# Patient Record
Sex: Male | Born: 1945 | Hispanic: Yes | Marital: Married | Smoking: Former smoker
Health system: Southern US, Community
[De-identification: ages and names within clinical notes are randomized; demographics above are authoritative.]

## PROBLEM LIST (undated history)

## (undated) DIAGNOSIS — N400 Enlarged prostate without lower urinary tract symptoms: Secondary | ICD-10-CM

## (undated) DIAGNOSIS — R0600 Dyspnea, unspecified: Secondary | ICD-10-CM

## (undated) DIAGNOSIS — C801 Malignant (primary) neoplasm, unspecified: Secondary | ICD-10-CM

## (undated) DIAGNOSIS — J431 Panlobular emphysema: Secondary | ICD-10-CM

## (undated) DIAGNOSIS — K219 Gastro-esophageal reflux disease without esophagitis: Secondary | ICD-10-CM

## (undated) DIAGNOSIS — J45909 Unspecified asthma, uncomplicated: Secondary | ICD-10-CM

## (undated) DIAGNOSIS — J449 Chronic obstructive pulmonary disease, unspecified: Secondary | ICD-10-CM

## (undated) DIAGNOSIS — H269 Unspecified cataract: Secondary | ICD-10-CM

## (undated) DIAGNOSIS — C679 Malignant neoplasm of bladder, unspecified: Secondary | ICD-10-CM

## (undated) DIAGNOSIS — K4090 Unilateral inguinal hernia, without obstruction or gangrene, not specified as recurrent: Secondary | ICD-10-CM

## (undated) DIAGNOSIS — E785 Hyperlipidemia, unspecified: Secondary | ICD-10-CM

## (undated) DIAGNOSIS — I10 Essential (primary) hypertension: Secondary | ICD-10-CM

## (undated) DIAGNOSIS — G4733 Obstructive sleep apnea (adult) (pediatric): Secondary | ICD-10-CM

## (undated) DIAGNOSIS — K567 Ileus, unspecified: Secondary | ICD-10-CM

## (undated) DIAGNOSIS — M1990 Unspecified osteoarthritis, unspecified site: Secondary | ICD-10-CM

## (undated) DIAGNOSIS — K9189 Other postprocedural complications and disorders of digestive system: Secondary | ICD-10-CM

## (undated) DIAGNOSIS — K5720 Diverticulitis of large intestine with perforation and abscess without bleeding: Secondary | ICD-10-CM

## (undated) HISTORY — DX: Essential (primary) hypertension: I10

## (undated) HISTORY — DX: Malignant neoplasm of bladder, unspecified: C67.9

## (undated) HISTORY — PX: COLONOSCOPY: SHX174

---

## 2007-12-16 DIAGNOSIS — M549 Dorsalgia, unspecified: Secondary | ICD-10-CM | POA: Insufficient documentation

## 2007-12-30 DIAGNOSIS — M5430 Sciatica, unspecified side: Secondary | ICD-10-CM | POA: Insufficient documentation

## 2008-02-08 ENCOUNTER — Ambulatory Visit: Payer: Self-pay

## 2008-04-27 DIAGNOSIS — Z0000 Encounter for general adult medical examination without abnormal findings: Secondary | ICD-10-CM | POA: Insufficient documentation

## 2009-03-28 DIAGNOSIS — B351 Tinea unguium: Secondary | ICD-10-CM | POA: Insufficient documentation

## 2010-07-31 DIAGNOSIS — J4489 Other specified chronic obstructive pulmonary disease: Secondary | ICD-10-CM | POA: Insufficient documentation

## 2010-07-31 DIAGNOSIS — J449 Chronic obstructive pulmonary disease, unspecified: Secondary | ICD-10-CM | POA: Insufficient documentation

## 2011-12-04 DIAGNOSIS — H25019 Cortical age-related cataract, unspecified eye: Secondary | ICD-10-CM | POA: Insufficient documentation

## 2011-12-04 DIAGNOSIS — H04129 Dry eye syndrome of unspecified lacrimal gland: Secondary | ICD-10-CM | POA: Insufficient documentation

## 2013-01-29 DIAGNOSIS — J4489 Other specified chronic obstructive pulmonary disease: Secondary | ICD-10-CM | POA: Insufficient documentation

## 2013-01-29 DIAGNOSIS — R911 Solitary pulmonary nodule: Secondary | ICD-10-CM | POA: Insufficient documentation

## 2013-01-29 DIAGNOSIS — J449 Chronic obstructive pulmonary disease, unspecified: Secondary | ICD-10-CM | POA: Insufficient documentation

## 2013-03-10 DIAGNOSIS — G894 Chronic pain syndrome: Secondary | ICD-10-CM | POA: Insufficient documentation

## 2013-03-12 ENCOUNTER — Ambulatory Visit: Payer: Self-pay | Admitting: Internal Medicine

## 2013-05-10 DIAGNOSIS — N400 Enlarged prostate without lower urinary tract symptoms: Secondary | ICD-10-CM | POA: Insufficient documentation

## 2013-05-10 DIAGNOSIS — K219 Gastro-esophageal reflux disease without esophagitis: Secondary | ICD-10-CM | POA: Insufficient documentation

## 2013-05-18 HISTORY — PX: CATARACT EXTRACTION W/ INTRAOCULAR LENS IMPLANT: SHX1309

## 2013-07-06 HISTORY — PX: CATARACT EXTRACTION W/ INTRAOCULAR LENS IMPLANT: SHX1309

## 2014-03-28 DIAGNOSIS — G5600 Carpal tunnel syndrome, unspecified upper limb: Secondary | ICD-10-CM | POA: Insufficient documentation

## 2014-04-15 ENCOUNTER — Ambulatory Visit: Payer: Self-pay | Admitting: Internal Medicine

## 2014-05-09 DIAGNOSIS — M25569 Pain in unspecified knee: Secondary | ICD-10-CM | POA: Insufficient documentation

## 2014-05-09 DIAGNOSIS — R7302 Impaired glucose tolerance (oral): Secondary | ICD-10-CM | POA: Insufficient documentation

## 2014-06-17 DIAGNOSIS — S39012A Strain of muscle, fascia and tendon of lower back, initial encounter: Secondary | ICD-10-CM | POA: Insufficient documentation

## 2014-06-17 DIAGNOSIS — M47816 Spondylosis without myelopathy or radiculopathy, lumbar region: Secondary | ICD-10-CM | POA: Insufficient documentation

## 2014-11-02 DIAGNOSIS — L989 Disorder of the skin and subcutaneous tissue, unspecified: Secondary | ICD-10-CM | POA: Insufficient documentation

## 2015-03-17 DIAGNOSIS — Z9109 Other allergy status, other than to drugs and biological substances: Secondary | ICD-10-CM | POA: Insufficient documentation

## 2015-03-20 DIAGNOSIS — K4090 Unilateral inguinal hernia, without obstruction or gangrene, not specified as recurrent: Secondary | ICD-10-CM | POA: Insufficient documentation

## 2015-03-30 DIAGNOSIS — H00019 Hordeolum externum unspecified eye, unspecified eyelid: Secondary | ICD-10-CM | POA: Insufficient documentation

## 2015-05-14 HISTORY — PX: HERNIA REPAIR: SHX51

## 2015-06-28 DIAGNOSIS — S0502XA Injury of conjunctiva and corneal abrasion without foreign body, left eye, initial encounter: Secondary | ICD-10-CM | POA: Insufficient documentation

## 2015-07-17 DIAGNOSIS — H269 Unspecified cataract: Secondary | ICD-10-CM | POA: Insufficient documentation

## 2016-01-08 HISTORY — PX: INGUINAL HERNIA REPAIR: SHX194

## 2017-06-24 ENCOUNTER — Telehealth (HOSPITAL_COMMUNITY): Payer: Self-pay

## 2017-06-24 NOTE — Telephone Encounter (Signed)
Patients insurance is active and benefits verified through [facility] - No co-pay, no deductible, out of pocket amount of $6,700/$105.55 has been met, no co-insurance, and no pre-authorization is required. Spoke with [facility] - reference [phone] Patient covered at 100%  Patient will be contacted for scheduling.

## 2017-07-01 ENCOUNTER — Telehealth (HOSPITAL_COMMUNITY): Payer: Self-pay

## 2017-07-01 NOTE — Telephone Encounter (Signed)
Called to speak with patient in regards to Pulmonary Rehab - Spoke with daughter of patient and daughter stated he wants to do PR at [facility] as it's closer for patient. Sent order to [facility].

## 2017-08-12 ENCOUNTER — Ambulatory Visit: Payer: Self-pay

## 2017-08-19 ENCOUNTER — Other Ambulatory Visit: Payer: Self-pay

## 2017-08-19 ENCOUNTER — Encounter: Payer: Medicare HMO | Attending: Pulmonary Disease

## 2017-08-19 VITALS — Ht 66.1 in | Wt 166.0 lb

## 2017-08-19 DIAGNOSIS — J441 Chronic obstructive pulmonary disease with (acute) exacerbation: Secondary | ICD-10-CM

## 2017-08-19 NOTE — Progress Notes (Signed)
Pulmonary Individual Treatment Plan  Patient Details  Name: Joe Torres MRN: [id number] Date of Birth: 13-Mar-1946 Referring Provider:     Pulmonary Rehab from 08/19/2017 in [facility] Cardiac and Pulmonary Rehab  Referring Provider  Jeanne Ivan MD      Initial Encounter Date:    Pulmonary Rehab from 08/19/2017 in [facility] Cardiac and Pulmonary Rehab  Date  08/19/17  Referring Provider  Jeanne Ivan MD      Visit Diagnosis: COPD with acute exacerbation [facility])  Patient's Home Medications on Admission: No current outpatient medications on file.  Past Medical History: History reviewed. No pertinent past medical history.  Tobacco Use: Social History   Tobacco Use  Smoking Status Former Smoker  . Packs/day: 1.50  . Years: 40.00  . Pack years: 60.00  . Types: Cigarettes  . Last attempt to quit: 05/21/2008  . Years since quitting: 9.2  Smokeless Tobacco Never Used    Labs: Recent Review Flowsheet Data    There is no flowsheet data to display.       Pulmonary Assessment Scores: Pulmonary Assessment Scores    Row Name 08/19/17 1132         ADL UCSD   ADL Phase  Entry     SOB Score total  92     Rest  2     Walk  3     Stairs  5     Bath  4     Dress  4     Shop  4       CAT Score   CAT Score  27       mMRC Score   mMRC Score  4        Pulmonary Function Assessment: Pulmonary Function Assessment - 08/19/17 1133      Breath   Bilateral Breath Sounds  Wheezes    Shortness of Breath  Yes;Limiting activity;Panic with Shortness of Breath       Exercise Target Goals: Date: 08/19/17  Exercise Program Goal: Individual exercise prescription set using results from initial 6 min walk test and THRR while considering  patient's activity barriers and safety.    Exercise Prescription Goal: Initial exercise prescription builds to 30-45 minutes a day of aerobic activity, 2-3 days per week.  Home exercise guidelines will be given to patient during program as part of exercise  prescription that the participant will acknowledge.  Activity Barriers & Risk Stratification: Activity Barriers & Cardiac Risk Stratification - 08/19/17 1248      Activity Barriers & Cardiac Risk Stratification   Activity Barriers  Muscular Weakness;Deconditioning;Shortness of Breath;Joint Problems occasional L knee pain       6 Minute Walk: 6 Minute Walk    Row Name 08/19/17 1242         6 Minute Walk   Phase  Initial     Distance  810 feet     Walk Time  4.83 minutes     # of Rest Breaks  6 14 sec, 8 sec, 9 sec, 11 sec, 21 sec, 7 sec     MPH  1.91     METS  2.58     RPE  15     Perceived Dyspnea   4     VO2 Peak  9.02     Symptoms  Yes (comment)     Comments  SOB, hot in hallway at turn around     Resting HR  90 bpm     Resting BP  146/64     Resting Oxygen Saturation   94 %     Exercise Oxygen Saturation  during 6 min walk  87 %     Max Ex. HR  117 bpm     Max Ex. BP  164/74     2 Minute Post BP  156/62       Interval HR   1 Minute HR  107     2 Minute HR  109     3 Minute HR  114     4 Minute HR  115     5 Minute HR  116     6 Minute HR  117     2 Minute Post HR  108     Interval Heart Rate?  Yes       Interval Oxygen   Interval Oxygen?  Yes     Baseline Oxygen Saturation %  94 %     1 Minute Oxygen Saturation %  91 % rest 1:51-2:05     1 Minute Liters of Oxygen  0 L Room Air     2 Minute Oxygen Saturation %  88 % rest 2:36-2:44     2 Minute Liters of Oxygen  0 L     3 Minute Oxygen Saturation %  87 % 3:12-3:21, 3:49-4:00     3 Minute Liters of Oxygen  0 L     4 Minute Oxygen Saturation %  88 % rest 4:39-5:00     4 Minute Liters of Oxygen  0 L     5 Minute Oxygen Saturation %  88 % rest 5:23-5:30     5 Minute Liters of Oxygen  0 L     6 Minute Oxygen Saturation %  89 %     6 Minute Liters of Oxygen  0 L     2 Minute Post Oxygen Saturation %  93 %     2 Minute Post Liters of Oxygen  0 L       Oxygen Initial Assessment: Oxygen Initial Assessment -  08/19/17 1139      Home Oxygen   Home Oxygen Device  Home Concentrator;E-Tanks    Sleep Oxygen Prescription  CPAP    Liters per minute  4    Home Exercise Oxygen Prescription  Continuous    Liters per minute  3    Home at Rest Exercise Oxygen Prescription  Continuous    Liters per minute  3    Compliance with Home Oxygen Use  Yes      Initial 6 min Walk   Oxygen Used  None      Program Oxygen Prescription   Program Oxygen Prescription  None      Intervention   Short Term Goals  To learn and exhibit compliance with exercise, home and travel O2 prescription;To learn and understand importance of maintaining oxygen saturations>88%;To learn and demonstrate proper use of respiratory medications;To learn and demonstrate proper pursed lip breathing techniques or other breathing techniques.;To learn and understand importance of monitoring SPO2 with pulse oximeter and demonstrate accurate use of the pulse oximeter. Nebulizers albuterol BID. Takes 3 medications BID    Long  Term Goals  Exhibits compliance with exercise, home and travel O2 prescription;Verbalizes importance of monitoring SPO2 with pulse oximeter and return demonstration;Maintenance of O2 saturations>88%;Exhibits proper breathing techniques, such as pursed lip breathing or other method taught during program session;Compliance with respiratory medication;Demonstrates proper use of MDI's  Oxygen Re-Evaluation:   Oxygen Discharge (Final Oxygen Re-Evaluation):   Initial Exercise Prescription: Initial Exercise Prescription - 08/19/17 1200      Date of Initial Exercise RX and Referring Provider   Date  08/19/17    Referring Provider  Jeanne Ivan MD      Treadmill   MPH  1.7    Grade  0.5    Minutes  15    METs  2.42      Recumbant Elliptical   Level  1    RPM  50    Minutes  15    METs  2.5      T5 Nustep   Level  1    SPM  80    Minutes  15    METs  2.5      Prescription Details   Frequency (times per  week)  3    Duration  Progress to 45 minutes of aerobic exercise without signs/symptoms of physical distress      Intensity   THRR 40-80% of Max Heartrate  114-139    Ratings of Perceived Exertion  11-13    Perceived Dyspnea  0-4      Progression   Progression  Continue to progress workloads to maintain intensity without signs/symptoms of physical distress.      Resistance Training   Training Prescription  Yes    Weight  4lbs    Reps  10-15       Perform Capillary Blood Glucose checks as needed.  Exercise Prescription Changes: Exercise Prescription Changes    Row Name 08/19/17 1200             Response to Exercise   Blood Pressure (Admit)  146/64       Blood Pressure (Exercise)  164/74       Blood Pressure (Exit)  156/62       Heart Rate (Admit)  90 bpm       Heart Rate (Exercise)  117 bpm       Heart Rate (Exit)  108 bpm       Oxygen Saturation (Admit)  94 %       Oxygen Saturation (Exercise)  87 %       Oxygen Saturation (Exit)  93 %       Rating of Perceived Exertion (Exercise)  15       Perceived Dyspnea (Exercise)  4       Symptoms  SOB, hot in hall at turn       Comments  walk test results          Exercise Comments:   Exercise Goals and Review: Exercise Goals    Row Name 08/19/17 1251             Exercise Goals   Increase Physical Activity  Yes       Intervention  Provide advice, education, support and counseling about physical activity/exercise needs.;Develop an individualized exercise prescription for aerobic and resistive training based on initial evaluation findings, risk stratification, comorbidities and participant's personal goals.       Expected Outcomes  Short Term: Attend rehab on a regular basis to increase amount of physical activity.;Long Term: Add in home exercise to make exercise part of routine and to increase amount of physical activity.;Long Term: Exercising regularly at least 3-5 days a week.       Increase Strength and Stamina   Yes       Intervention  Provide advice, education, support  and counseling about physical activity/exercise needs.;Develop an individualized exercise prescription for aerobic and resistive training based on initial evaluation findings, risk stratification, comorbidities and participant's personal goals.       Expected Outcomes  Short Term: Increase workloads from initial exercise prescription for resistance, speed, and METs.;Short Term: Perform resistance training exercises routinely during rehab and add in resistance training at home;Long Term: Improve cardiorespiratory fitness, muscular endurance and strength as measured by increased METs and functional capacity (6MWT)       Able to understand and use rate of perceived exertion (RPE) scale  Yes       Intervention  Provide education and explanation on how to use RPE scale       Expected Outcomes  Short Term: Able to use RPE daily in rehab to express subjective intensity level;Long Term:  Able to use RPE to guide intensity level when exercising independently       Able to understand and use Dyspnea scale  Yes       Intervention  Provide education and explanation on how to use Dyspnea scale       Expected Outcomes  Short Term: Able to use Dyspnea scale daily in rehab to express subjective sense of shortness of breath during exertion;Long Term: Able to use Dyspnea scale to guide intensity level when exercising independently       Knowledge and understanding of Target Heart Rate Range (THRR)  Yes       Intervention  Provide education and explanation of THRR including how the numbers were predicted and where they are located for reference       Expected Outcomes  Short Term: Able to state/look up THRR;Short Term: Able to use daily as guideline for intensity in rehab;Long Term: Able to use THRR to govern intensity when exercising independently       Able to check pulse independently  Yes       Intervention  Provide education and demonstration on how to check  pulse in carotid and radial arteries.;Review the importance of being able to check your own pulse for safety during independent exercise       Expected Outcomes  Short Term: Able to explain why pulse checking is important during independent exercise;Long Term: Able to check pulse independently and accurately       Understanding of Exercise Prescription  Yes       Intervention  Provide education, explanation, and written materials on patient's individual exercise prescription       Expected Outcomes  Short Term: Able to explain program exercise prescription;Long Term: Able to explain home exercise prescription to exercise independently          Exercise Goals Re-Evaluation :   Discharge Exercise Prescription (Final Exercise Prescription Changes): Exercise Prescription Changes - 08/19/17 1200      Response to Exercise   Blood Pressure (Admit)  146/64    Blood Pressure (Exercise)  164/74    Blood Pressure (Exit)  156/62    Heart Rate (Admit)  90 bpm    Heart Rate (Exercise)  117 bpm    Heart Rate (Exit)  108 bpm    Oxygen Saturation (Admit)  94 %    Oxygen Saturation (Exercise)  87 %    Oxygen Saturation (Exit)  93 %    Rating of Perceived Exertion (Exercise)  15    Perceived Dyspnea (Exercise)  4    Symptoms  SOB, hot in hall at turn    Comments  walk test results  Nutrition:  Target Goals: Understanding of nutrition guidelines, daily intake of sodium <1566m, cholesterol <2063m calories 30% from fat and 7% or less from saturated fats, daily to have 5 or more servings of fruits and vegetables.  Biometrics: Pre Biometrics - 08/19/17 1252      Pre Biometrics   Height  5' 6.1" (1.679 m)    Weight  166 lb (75.3 kg)    Waist Circumference  39 inches    Hip Circumference  38 inches    Waist to Hip Ratio  1.03 %    BMI (Calculated)  26.71        Nutrition Therapy Plan and Nutrition Goals: Nutrition Therapy & Goals - 08/19/17 1130      Personal Nutrition Goals    Comments  Learn to diet better to help with his COPD, to learn portion control.      Intervention Plan   Intervention  Prescribe, educate and counsel regarding individualized specific dietary modifications aiming towards targeted core components such as weight, hypertension, lipid management, diabetes, heart failure and other comorbidities.    Expected Outcomes  Short Term Goal: Understand basic principles of dietary content, such as calories, fat, sodium, cholesterol and nutrients.;Long Term Goal: Adherence to prescribed nutrition plan.       Nutrition Assessments: Nutrition Assessments - 08/19/17 1211      MEDFICTS Scores   Pre Score  50       Nutrition Goals Re-Evaluation:   Nutrition Goals Discharge (Final Nutrition Goals Re-Evaluation):   Psychosocial: Target Goals: Acknowledge presence or absence of significant depression and/or stress, maximize coping skills, provide positive support system. Participant is able to verbalize types and ability to use techniques and skills needed for reducing stress and depression.   Initial Review & Psychosocial Screening: Initial Psych Review & Screening - 08/19/17 1127      Initial Review   Current issues with  Current Stress Concerns    Source of Stress Concerns  Chronic Illness    Comments  He is not able to do yard work and be outside like he used to.      Family Dynamics   Good Support System?  Yes    Comments  His daughter is a good support system      Barriers   Psychosocial barriers to participate in program  The patient should benefit from training in stress management and relaxation.      Screening Interventions   Interventions  Encouraged to exercise;Program counselor consult;To provide support and resources with identified psychosocial needs;Provide feedback about the scores to participant    Expected Outcomes  Short Term goal: Utilizing psychosocial counselor, staff and physician to assist with identification of specific  Stressors or current issues interfering with healing process. Setting desired goal for each stressor or current issue identified.;Long Term Goal: Stressors or current issues are controlled or eliminated.;Short Term goal: Identification and review with participant of any Quality of Life or Depression concerns found by scoring the questionnaire.;Long Term goal: The participant improves quality of Life and PHQ9 Scores as seen by post scores and/or verbalization of changes       Quality of Life Scores:  Scores of 19 and below usually indicate a poorer quality of life in these areas.  A difference of  2-3 points is a clinically meaningful difference.  A difference of 2-3 points in the total score of the Quality of Life Index has been associated with significant improvement in overall quality of life, self-image, physical symptoms, and  general health in studies assessing change in quality of life.  PHQ-9: Recent Review Flowsheet Data    Depression screen [facility] 2/9 08/19/2017   Decreased Interest 2   Down, Depressed, Hopeless 1   PHQ - 2 Score 3   Altered sleeping 1   Tired, decreased energy 1   Change in appetite 0   Feeling bad or failure about yourself  0   Trouble concentrating 1   Moving slowly or fidgety/restless 0   Suicidal thoughts 0   PHQ-9 Score 6   Difficult doing work/chores Somewhat difficult     Interpretation of Total Score  Total Score Depression Severity:  1-4 = Minimal depression, 5-9 = Mild depression, 10-14 = Moderate depression, 15-19 = Moderately severe depression, 20-27 = Severe depression   Psychosocial Evaluation and Intervention:   Psychosocial Re-Evaluation:   Psychosocial Discharge (Final Psychosocial Re-Evaluation):   Education: Education Goals: Education classes will be provided on a weekly basis, covering required topics. Participant will state understanding/return demonstration of topics presented.  Learning Barriers/Preferences: Learning  Barriers/Preferences - 08/19/17 1136      Learning Barriers/Preferences   Learning Barriers  Sight;Language;Exercise Concerns wears glasses    Learning Preferences  Individual Instruction       Education Topics:  Initial Evaluation Education: - Verbal, written and demonstration of respiratory meds, oximetry and breathing techniques. Instruction on use of nebulizers and MDIs and importance of monitoring MDI activations.   Pulmonary Rehab from 08/19/2017 in [facility] Cardiac and Pulmonary Rehab  Date  08/19/17  Educator  [facility]  Instruction Review Code  1- Verbalizes Understanding      General Nutrition Guidelines/Fats and Fiber: -Group instruction provided by verbal, written material, models and posters to present the general guidelines for heart healthy nutrition. Gives an explanation and review of dietary fats and fiber.   Controlling Sodium/Reading Food Labels: -Group verbal and written material supporting the discussion of sodium use in heart healthy nutrition. Review and explanation with models, verbal and written materials for utilization of the food label.   Exercise Physiology & General Exercise Guidelines: - Group verbal and written instruction with models to review the exercise physiology of the cardiovascular system and associated critical values. Provides general exercise guidelines with specific guidelines to those with heart or lung disease.    Aerobic Exercise & Resistance Training: - Gives group verbal and written instruction on the various components of exercise. Focuses on aerobic and resistive training programs and the benefits of this training and how to safely progress through these programs.   Flexibility, Balance, Mind/Body Relaxation: Provides group verbal/written instruction on the benefits of flexibility and balance training, including mind/body exercise modes such as yoga, pilates and tai chi.  Demonstration and skill practice provided.   Stress and Anxiety: -  Provides group verbal and written instruction about the health risks of elevated stress and causes of high stress.  Discuss the correlation between heart/lung disease and anxiety and treatment options. Review healthy ways to manage with stress and anxiety.   Depression: - Provides group verbal and written instruction on the correlation between heart/lung disease and depressed mood, treatment options, and the stigmas associated with seeking treatment.   Exercise & Equipment Safety: - Individual verbal instruction and demonstration of equipment use and safety with use of the equipment.   Pulmonary Rehab from 08/19/2017 in [facility] Cardiac and Pulmonary Rehab  Date  08/19/17  Educator  [facility]  Instruction Review Code  1- Verbalizes Understanding      Infection Prevention: -  Provides verbal and written material to individual with discussion of infection control including proper hand washing and proper equipment cleaning during exercise session.   Pulmonary Rehab from 08/19/2017 in [facility] Cardiac and Pulmonary Rehab  Date  08/19/17  Educator  [facility]  Instruction Review Code  1- Verbalizes Understanding      Falls Prevention: - Provides verbal and written material to individual with discussion of falls prevention and safety.   Pulmonary Rehab from 08/19/2017 in [facility] Cardiac and Pulmonary Rehab  Date  08/19/17  Educator  [facility]  Instruction Review Code  1- Verbalizes Understanding      Diabetes: - Individual verbal and written instruction to review signs/symptoms of diabetes, desired ranges of glucose level fasting, after meals and with exercise. Advice that pre and post exercise glucose checks will be done for 3 sessions at entry of program.   Chronic Lung Diseases: - Group verbal and written instruction to review updates, respiratory medications, advancements in procedures and treatments. Discuss use of supplemental oxygen including available portable oxygen systems, continuous and intermittent flow rates,  concentrators, personal use and safety guidelines. Review proper use of inhaler and spacers. Provide informative websites for self-education.    Energy Conservation: - Provide group verbal and written instruction for methods to conserve energy, plan and organize activities. Instruct on pacing techniques, use of adaptive equipment and posture/positioning to relieve shortness of breath.   Triggers and Exacerbations: - Group verbal and written instruction to review types of environmental triggers and ways to prevent exacerbations. Discuss weather changes, air quality and the benefits of nasal washing. Review warning signs and symptoms to help prevent infections. Discuss techniques for effective airway clearance, coughing, and vibrations.   AED/CPR: - Group verbal and written instruction with the use of models to demonstrate the basic use of the AED with the basic ABC's of resuscitation.   Anatomy and Physiology of the Lungs: - Group verbal and written instruction with the use of models to provide basic lung anatomy and physiology related to function, structure and complications of lung disease.   Anatomy & Physiology of the Heart: - Group verbal and written instruction and models provide basic cardiac anatomy and physiology, with the coronary electrical and arterial systems. Review of Valvular disease and Heart Failure   Cardiac Medications: - Group verbal and written instruction to review commonly prescribed medications for heart disease. Reviews the medication, class of the drug, and side effects.   Know Your Numbers and Risk Factors: -Group verbal and written instruction about important numbers in your health.  Discussion of what are risk factors and how they play a role in the disease process.  Review of Cholesterol, Blood Pressure, Diabetes, and BMI and the role they play in your overall health.   Sleep Hygiene: -Provides group verbal and written instruction about how sleep can  affect your health.  Define sleep hygiene, discuss sleep cycles and impact of sleep habits. Review good sleep hygiene tips.    Other: -Provides group and verbal instruction on various topics (see comments)    Knowledge Questionnaire Score: Knowledge Questionnaire Score - 08/19/17 1135      Knowledge Questionnaire Score   Pre Score  16/18 reviewed with patient        Core Components/Risk Factors/Patient Goals at Admission: Personal Goals and Risk Factors at Admission - 08/19/17 1144      Core Components/Risk Factors/Patient Goals on Admission    Weight Management  Yes;Weight Maintenance;Weight Loss    Intervention  Weight Management: Develop a  combined nutrition and exercise program designed to reach desired caloric intake, while maintaining appropriate intake of nutrient and fiber, sodium and fats, and appropriate energy expenditure required for the weight goal.;Weight Management: Provide education and appropriate resources to help participant work on and attain dietary goals.;Weight Management/Obesity: Establish reasonable short term and long term weight goals.    Admit Weight  166 lb (75.3 kg)    Goal Weight: Short Term  161 lb (73 kg)    Goal Weight: Long Term  155 lb (70.3 kg)    Expected Outcomes  Short Term: Continue to assess and modify interventions until short term weight is achieved;Long Term: Adherence to nutrition and physical activity/exercise program aimed toward attainment of established weight goal;Weight Maintenance: Understanding of the daily nutrition guidelines, which includes 25-35% calories from fat, 7% or less cal from saturated fats, less than 257m cholesterol, less than 1.5gm of sodium, & 5 or more servings of fruits and vegetables daily;Understanding recommendations for meals to include 15-35% energy as protein, 25-35% energy from fat, 35-60% energy from carbohydrates, less than 2061mof dietary cholesterol, 20-35 gm of total fiber daily;Understanding of  distribution of calorie intake throughout the day with the consumption of 4-5 meals/snacks;Weight Loss: Understanding of general recommendations for a balanced deficit meal plan, which promotes 1-2 lb weight loss per week and includes a negative energy balance of [phone] kcal/d    Improve shortness of breath with ADL's  Yes    Intervention  Provide education, individualized exercise plan and daily activity instruction to help decrease symptoms of SOB with activities of daily living.    Expected Outcomes  Short Term: Improve cardiorespiratory fitness to achieve a reduction of symptoms when performing ADLs;Long Term: Be able to perform more ADLs without symptoms or delay the onset of symptoms    Hypertension  Yes    Intervention  Provide education on lifestyle modifcations including regular physical activity/exercise, weight management, moderate sodium restriction and increased consumption of fresh fruit, vegetables, and low fat dairy, alcohol moderation, and smoking cessation.;Monitor prescription use compliance. takes medication for blood pressure    Expected Outcomes  Short Term: Continued assessment and intervention until BP is < 140/9018mG in hypertensive participants. < 130/17m50m in hypertensive participants with diabetes, heart failure or chronic kidney disease.;Long Term: Maintenance of blood pressure at goal levels. at home 140-145/80's.    Lipids  Yes    Intervention  Provide education and support for participant on nutrition & aerobic/resistive exercise along with prescribed medications to achieve LDL <70mg53mL >40mg.25mExpected Outcomes  Short Term: Participant states understanding of desired cholesterol values and is compliant with medications prescribed. Participant is following exercise prescription and nutrition guidelines.;Long Term: Cholesterol controlled with medications as prescribed, with individualized exercise RX and with personalized nutrition plan. Value goals: LDL < 70mg, 16m>  40 mg.       Core Components/Risk Factors/Patient Goals Review:    Core Components/Risk Factors/Patient Goals at Discharge (Final Review):    ITP Comments: ITP Comments    Row Name 08/19/17 1046           ITP Comments  Medical Evaluation completed. Chart sent for review and changes to Dr. Mark MiEmily Filbertor of [facility]          Comments: Initial ITP

## 2017-08-19 NOTE — Patient Instructions (Signed)
Patient Instructions  Patient Details  Name: Joe Torres MRN: [id number] Date of Birth: [birth year] Referring Provider:  Tedra Coupe, MD  Below are your personal goals for exercise, nutrition, and risk factors. Our goal is to help you stay on track towards obtaining and maintaining these goals. We will be discussing your progress on these goals with you throughout the program.  Initial Exercise Prescription: Initial Exercise Prescription - 08/19/17 1200      Date of Initial Exercise RX and Referring Provider   Date  08/19/17    Referring Provider  Jeanne Ivan MD      Treadmill   MPH  1.7    Grade  0.5    Minutes  15    METs  2.42      Recumbant Elliptical   Level  1    RPM  50    Minutes  15    METs  2.5      T5 Nustep   Level  1    SPM  80    Minutes  15    METs  2.5      Prescription Details   Frequency (times per week)  3    Duration  Progress to 45 minutes of aerobic exercise without signs/symptoms of physical distress      Intensity   THRR 40-80% of Max Heartrate  114-139    Ratings of Perceived Exertion  11-13    Perceived Dyspnea  0-4      Progression   Progression  Continue to progress workloads to maintain intensity without signs/symptoms of physical distress.      Resistance Training   Training Prescription  Yes    Weight  4lbs    Reps  10-15       Exercise Goals: Frequency: Be able to perform aerobic exercise two to three times per week in program working toward 2-5 days per week of home exercise.  Intensity: Work with a perceived exertion of 11 (fairly light) - 15 (hard) while following your exercise prescription.  We will make changes to your prescription with you as you progress through the program.   Duration: Be able to do 30 to 45 minutes of continuous aerobic exercise in addition to a 5 minute warm-up and a 5 minute cool-down routine.   Nutrition Goals: Your personal nutrition goals will be established when you do your nutrition  analysis with the dietician.  The following are general nutrition guidelines to follow: Cholesterol < 200mg /day Sodium < 1500mg /day Fiber: Men over 50 yrs - 30 grams per day  Personal Goals: Personal Goals and Risk Factors at Admission - 08/19/17 1144      Core Components/Risk Factors/Patient Goals on Admission    Weight Management  Yes;Weight Maintenance;Weight Loss    Intervention  Weight Management: Develop a combined nutrition and exercise program designed to reach desired caloric intake, while maintaining appropriate intake of nutrient and fiber, sodium and fats, and appropriate energy expenditure required for the weight goal.;Weight Management: Provide education and appropriate resources to help participant work on and attain dietary goals.;Weight Management/Obesity: Establish reasonable short term and long term weight goals.    Admit Weight  166 lb (75.3 kg)    Goal Weight: Short Term  161 lb (73 kg)    Goal Weight: Long Term  155 lb (70.3 kg)    Expected Outcomes  Short Term: Continue to assess and modify interventions until short term weight is achieved;Long Term: Adherence to nutrition and physical activity/exercise  program aimed toward attainment of established weight goal;Weight Maintenance: Understanding of the daily nutrition guidelines, which includes 25-35% calories from fat, 7% or less cal from saturated fats, less than 200mg  cholesterol, less than 1.5gm of sodium, & 5 or more servings of fruits and vegetables daily;Understanding recommendations for meals to include 15-35% energy as protein, 25-35% energy from fat, 35-60% energy from carbohydrates, less than 200mg  of dietary cholesterol, 20-35 gm of total fiber daily;Understanding of distribution of calorie intake throughout the day with the consumption of 4-5 meals/snacks;Weight Loss: Understanding of general recommendations for a balanced deficit meal plan, which promotes 1-2 lb weight loss per week and includes a negative energy  balance of [phone] kcal/d    Improve shortness of breath with ADL's  Yes    Intervention  Provide education, individualized exercise plan and daily activity instruction to help decrease symptoms of SOB with activities of daily living.    Expected Outcomes  Short Term: Improve cardiorespiratory fitness to achieve a reduction of symptoms when performing ADLs;Long Term: Be able to perform more ADLs without symptoms or delay the onset of symptoms    Hypertension  Yes    Intervention  Provide education on lifestyle modifcations including regular physical activity/exercise, weight management, moderate sodium restriction and increased consumption of fresh fruit, vegetables, and low fat dairy, alcohol moderation, and smoking cessation.;Monitor prescription use compliance. takes medication for blood pressure    Expected Outcomes  Short Term: Continued assessment and intervention until BP is < 140/38mm HG in hypertensive participants. < 130/72mm HG in hypertensive participants with diabetes, heart failure or chronic kidney disease.;Long Term: Maintenance of blood pressure at goal levels. at home 140-145/80's.    Lipids  Yes    Intervention  Provide education and support for participant on nutrition & aerobic/resistive exercise along with prescribed medications to achieve LDL 70mg , HDL >40mg .    Expected Outcomes  Short Term: Participant states understanding of desired cholesterol values and is compliant with medications prescribed. Participant is following exercise prescription and nutrition guidelines.;Long Term: Cholesterol controlled with medications as prescribed, with individualized exercise RX and with personalized nutrition plan. Value goals: LDL < 70mg , HDL > 40 mg.       Tobacco Use Initial Evaluation: Social History   Tobacco Use  Smoking Status Former Smoker  . Packs/day: 1.50  . Years: 40.00  . Pack years: 60.00  . Types: Cigarettes  . Last attempt to quit: 05/21/2008  . Years since  quitting: 9.2  Smokeless Tobacco Never Used    Exercise Goals and Review: Exercise Goals    Row Name 08/19/17 1251             Exercise Goals   Increase Physical Activity  Yes       Intervention  Provide advice, education, support and counseling about physical activity/exercise needs.;Develop an individualized exercise prescription for aerobic and resistive training based on initial evaluation findings, risk stratification, comorbidities and participant's personal goals.       Expected Outcomes  Short Term: Attend rehab on a regular basis to increase amount of physical activity.;Long Term: Add in home exercise to make exercise part of routine and to increase amount of physical activity.;Long Term: Exercising regularly at least 3-5 days a week.       Increase Strength and Stamina  Yes       Intervention  Provide advice, education, support and counseling about physical activity/exercise needs.;Develop an individualized exercise prescription for aerobic and resistive training based on initial evaluation findings, risk stratification,  comorbidities and participant's personal goals.       Expected Outcomes  Short Term: Increase workloads from initial exercise prescription for resistance, speed, and METs.;Short Term: Perform resistance training exercises routinely during rehab and add in resistance training at home;Long Term: Improve cardiorespiratory fitness, muscular endurance and strength as measured by increased METs and functional capacity (6MWT)       Able to understand and use rate of perceived exertion (RPE) scale  Yes       Intervention  Provide education and explanation on how to use RPE scale       Expected Outcomes  Short Term: Able to use RPE daily in rehab to express subjective intensity level;Long Term:  Able to use RPE to guide intensity level when exercising independently       Able to understand and use Dyspnea scale  Yes       Intervention  Provide education and explanation on how  to use Dyspnea scale       Expected Outcomes  Short Term: Able to use Dyspnea scale daily in rehab to express subjective sense of shortness of breath during exertion;Long Term: Able to use Dyspnea scale to guide intensity level when exercising independently       Knowledge and understanding of Target Heart Rate Range (THRR)  Yes       Intervention  Provide education and explanation of THRR including how the numbers were predicted and where they are located for reference       Expected Outcomes  Short Term: Able to state/look up THRR;Short Term: Able to use daily as guideline for intensity in rehab;Long Term: Able to use THRR to govern intensity when exercising independently       Able to check pulse independently  Yes       Intervention  Provide education and demonstration on how to check pulse in carotid and radial arteries.;Review the importance of being able to check your own pulse for safety during independent exercise       Expected Outcomes  Short Term: Able to explain why pulse checking is important during independent exercise;Long Term: Able to check pulse independently and accurately       Understanding of Exercise Prescription  Yes       Intervention  Provide education, explanation, and written materials on patient's individual exercise prescription       Expected Outcomes  Short Term: Able to explain program exercise prescription;Long Term: Able to explain home exercise prescription to exercise independently          Copy of goals given to participant.

## 2017-08-25 DIAGNOSIS — J441 Chronic obstructive pulmonary disease with (acute) exacerbation: Secondary | ICD-10-CM

## 2017-08-25 NOTE — Progress Notes (Signed)
Daily Session Note  Patient Details  Name: Joe Torres MRN: [id number] Date of Birth: Nov 15, 1945 Referring Provider:     Pulmonary Rehab from 08/19/2017 in [facility] Cardiac and Pulmonary Rehab  Referring Provider  Jeanne Ivan MD      Encounter Date: 08/25/2017  Check In: Session Check In - 08/25/17 1026      Check-In   Location  [facility]-Cardiac & Pulmonary Rehab    Staff Present  Earlean Shawl, BS, ACSM CEP, Exercise Physiologist;Amanda Oletta Darter, BA, ACSM CEP, Exercise Physiologist;Loyda Costin Flavia Shipper    Supervising physician immediately available to respond to emergencies  [facility] immediately available ER MD    Physician(s)  Dr. Joni Fears and Burlene Arnt    Medication changes reported      No    Fall or balance concerns reported     No    Tobacco Cessation  No Change    Warm-up and Cool-down  Performed as group-led instruction patient late for warm up    Resistance Training Performed  No patient late    VAD Patient?  No      Pain Assessment   Currently in Pain?  No/denies          Social History   Tobacco Use  Smoking Status Former Smoker  . Packs/day: 1.50  . Years: 40.00  . Pack years: 60.00  . Types: Cigarettes  . Last attempt to quit: 05/21/2008  . Years since quitting: 9.2  Smokeless Tobacco Never Used    Goals Met:  Exercise tolerated well Personal goals reviewed Queuing for purse lip breathing No report of cardiac concerns or symptoms Strength training completed today  Goals Unmet:  Not Applicable  Comments: First full day of exercise!  Patient was oriented to gym and equipment including functions, settings, policies, and procedures.  Patient's individual exercise prescription and treatment plan were reviewed.  All starting workloads were established based on the results of the 6 minute walk test done at initial orientation visit.  The plan for exercise progression was also introduced and progression will be customized based on patient's performance and  goals.   Dr. Emily Filbert is Medical Director for [place] and [facility].

## 2017-08-27 DIAGNOSIS — J441 Chronic obstructive pulmonary disease with (acute) exacerbation: Secondary | ICD-10-CM

## 2017-08-27 NOTE — Progress Notes (Signed)
Daily Session Note  Patient Details  Name: Joe Torres MRN: [id number] Date of Birth: 01/19/1946 Referring Provider:     Pulmonary Rehab from 08/19/2017 in [facility] Cardiac and Pulmonary Rehab  Referring Provider  Jeanne Ivan MD      Encounter Date: 08/27/2017  Check In: Session Check In - 08/27/17 1115      Check-In   Location  [facility]-Cardiac & Pulmonary Rehab    Staff Present  Justin Mend Lorre Nick, [place], RCEP, CCRP, Exercise Physiologist;Meredith Sherryll Burger, RN BSN    Supervising physician immediately available to respond to emergencies  [facility] immediately available ER MD    Physician(s)  Dr. Owens Shark and Jimmye Norman    Medication changes reported      No    Fall or balance concerns reported     No    Tobacco Cessation  No Change    Warm-up and Cool-down  Performed as group-led instruction    Resistance Training Performed  Yes    VAD Patient?  No      Pain Assessment   Currently in Pain?  No/denies          Social History   Tobacco Use  Smoking Status Former Smoker  . Packs/day: 1.50  . Years: 40.00  . Pack years: 60.00  . Types: Cigarettes  . Last attempt to quit: 05/21/2008  . Years since quitting: 9.2  Smokeless Tobacco Never Used    Goals Met:  Exercise tolerated well No report of cardiac concerns or symptoms Strength training completed today  Goals Unmet:  Not Applicable  Comments: Pt able to follow exercise prescription today without complaint.  Will continue to monitor for progression.   Dr. Emily Filbert is Medical Director for [place] and [facility].

## 2017-09-01 DIAGNOSIS — J441 Chronic obstructive pulmonary disease with (acute) exacerbation: Secondary | ICD-10-CM | POA: Diagnosis not present

## 2017-09-01 NOTE — Progress Notes (Signed)
Daily Session Note  Patient Details  Name: Joe Torres MRN: [id number] Date of Birth: Jul 26, 1945 Referring Provider:     Pulmonary Rehab from 08/19/2017 in [facility] Cardiac and Pulmonary Rehab  Referring Provider  Jeanne Ivan MD      Encounter Date: 09/01/2017  Check In: Session Check In - 09/01/17 1009      Check-In   Location  [facility]-Cardiac & Pulmonary Rehab    Staff Present  Earlean Shawl, BS, ACSM CEP, Exercise Physiologist;Mandi Zachery Conch, BS, PEC;Mozell Haber [employer] physician immediately available to respond to emergencies  [facility] immediately available ER MD    Physician(s)  Dr. Reita Cliche and Cinda Quest    Medication changes reported      No    Fall or balance concerns reported     No    Tobacco Cessation  No Change    Warm-up and Cool-down  Performed as group-led instruction    Resistance Training Performed  Yes    VAD Patient?  No      Pain Assessment   Currently in Pain?  No/denies          Social History   Tobacco Use  Smoking Status Former Smoker  . Packs/day: 1.50  . Years: 40.00  . Pack years: 60.00  . Types: Cigarettes  . Last attempt to quit: 05/21/2008  . Years since quitting: 9.2  Smokeless Tobacco Never Used    Goals Met:  Independence with exercise equipment Exercise tolerated well No report of cardiac concerns or symptoms Strength training completed today  Goals Unmet:  Not Applicable  Comments: Pt able to follow exercise prescription today without complaint.  Will continue to monitor for progression.   Dr. Emily Filbert is Medical Director for [place] and [facility].

## 2017-09-05 DIAGNOSIS — J441 Chronic obstructive pulmonary disease with (acute) exacerbation: Secondary | ICD-10-CM

## 2017-09-05 NOTE — Progress Notes (Signed)
Daily Session Note  Patient Details  Name: Joe Torres MRN: [id number] Date of Birth: [birth year] Referring Provider:     Pulmonary Rehab from 08/19/2017 in [facility] Cardiac and Pulmonary Rehab  Referring Provider  Jeanne Ivan MD      Encounter Date: 09/05/2017  Check In: Session Check In - 09/05/17 1013      Check-In   Staff Present  Renita Papa, RN Vickki Hearing, BA, ACSM CEP, Exercise Physiologist;Solimar Maiden Flavia Shipper    Supervising physician immediately available to respond to emergencies  [facility] immediately available ER MD    Physician(s)  Dr. Jimmye Norman and Corky Downs    Medication changes reported      No    Fall or balance concerns reported     No    Tobacco Cessation  No Change    Warm-up and Cool-down  Performed as group-led instruction    Resistance Training Performed  Yes    VAD Patient?  No      Pain Assessment   Currently in Pain?  No/denies          Social History   Tobacco Use  Smoking Status Former Smoker  . Packs/day: 1.50  . Years: 40.00  . Pack years: 60.00  . Types: Cigarettes  . Last attempt to quit: 05/21/2008  . Years since quitting: 9.2  Smokeless Tobacco Never Used    Goals Met:  Proper associated with RPD/PD & O2 Sat Independence with exercise equipment Using PLB without cueing & demonstrates good technique Exercise tolerated well No report of cardiac concerns or symptoms Strength training completed today  Goals Unmet:  Not Applicable  Comments: Pt able to follow exercise prescription today without complaint.  Will continue to monitor for progression.    Dr. Emily Filbert is Medical Director for [place] and [facility].

## 2017-09-08 DIAGNOSIS — J441 Chronic obstructive pulmonary disease with (acute) exacerbation: Secondary | ICD-10-CM

## 2017-09-08 NOTE — Progress Notes (Signed)
Daily Session Note  Patient Details  Name: Joe Torres MRN: [id number] Date of Birth: Jan 02, 1946 Referring Provider:     Pulmonary Rehab from 08/19/2017 in [facility] Cardiac and Pulmonary Rehab  Referring Provider  Jeanne Ivan MD      Encounter Date: 09/08/2017  Check In: Session Check In - 09/08/17 1015      Check-In   Location  [facility]-Cardiac & Pulmonary Rehab    Staff Present  Earlean Shawl, BS, ACSM CEP, Exercise Physiologist;Laureen Owens Shark, BS, RRT, Respiratory Therapist    Supervising physician immediately available to respond to emergencies  [facility] immediately available ER MD    Physician(s)  Dr. Corky Downs and Alfred Levins    Medication changes reported      No    Fall or balance concerns reported     No    Tobacco Cessation  No Change    Warm-up and Cool-down  Performed as group-led instruction    Resistance Training Performed  Yes    VAD Patient?  No      Pain Assessment   Currently in Pain?  No/denies          Social History   Tobacco Use  Smoking Status Former Smoker  . Packs/day: 1.50  . Years: 40.00  . Pack years: 60.00  . Types: Cigarettes  . Last attempt to quit: 05/21/2008  . Years since quitting: 9.3  Smokeless Tobacco Never Used    Goals Met:  Independence with exercise equipment Exercise tolerated well No report of cardiac concerns or symptoms Strength training completed today  Goals Unmet:  Not Applicable  Comments: Pt able to follow exercise prescription today without complaint.  Will continue to monitor for progression.   Dr. Emily Filbert is Medical Director for [place] and [facility].

## 2017-09-10 ENCOUNTER — Encounter: Payer: Medicare HMO | Attending: Pulmonary Disease

## 2017-09-10 DIAGNOSIS — J441 Chronic obstructive pulmonary disease with (acute) exacerbation: Secondary | ICD-10-CM | POA: Diagnosis present

## 2017-09-10 NOTE — Progress Notes (Signed)
Daily Session Note  Patient Details  Name: Joe Torres MRN: [id number] Date of Birth: 02/11/1946 Referring Provider:     Pulmonary Rehab from 08/19/2017 in [facility] Cardiac and Pulmonary Rehab  Referring Provider  Jeanne Ivan MD      Encounter Date: 09/10/2017  Check In: Session Check In - 09/10/17 1017      Check-In   Location  [facility]-Cardiac & Pulmonary Rehab    Staff Present  Justin Mend RCP,RRT,BSRT;Amanda Oletta Darter, BA, ACSM CEP, Exercise Physiologist;Jessica Luan Pulling, MA, RCEP, CCRP, Exercise Physiologist    Supervising physician immediately available to respond to emergencies  [facility] immediately available ER MD    Physician(s)  Dr. Alfred Levins and Burlene Arnt    Medication changes reported      No    Fall or balance concerns reported     No    Tobacco Cessation  No Change    Warm-up and Cool-down  Performed as group-led instruction    Resistance Training Performed  Yes    VAD Patient?  No      Pain Assessment   Currently in Pain?  No/denies          Social History   Tobacco Use  Smoking Status Former Smoker  . Packs/day: 1.50  . Years: 40.00  . Pack years: 60.00  . Types: Cigarettes  . Last attempt to quit: 05/21/2008  . Years since quitting: 9.3  Smokeless Tobacco Never Used    Goals Met:  Independence with exercise equipment Exercise tolerated well No report of cardiac concerns or symptoms Strength training completed today  Goals Unmet:  Not Applicable  Comments: Pt able to follow exercise prescription today without complaint.  Will continue to monitor for progression.   Dr. Emily Filbert is Medical Director for [place] and [facility].

## 2017-09-12 ENCOUNTER — Telehealth: Payer: Self-pay | Admitting: *Deleted

## 2017-09-12 NOTE — Telephone Encounter (Signed)
Joe Torres's daughter called to inform staff that he is sick today and will not be coming to class. He hopes to return Monday.

## 2017-09-15 DIAGNOSIS — J441 Chronic obstructive pulmonary disease with (acute) exacerbation: Secondary | ICD-10-CM

## 2017-09-15 NOTE — Progress Notes (Signed)
Daily Session Note  Patient Details  Name: MERLIN GOLDEN MRN: [id number] Date of Birth: November 22, 1945 Referring Provider:     Pulmonary Rehab from 08/19/2017 in [facility] Cardiac and Pulmonary Rehab  Referring Provider  Jeanne Ivan MD      Encounter Date: 09/15/2017  Check In: Session Check In - 09/15/17 1019      Check-In   Location  [facility]-Cardiac & Pulmonary Rehab    Staff Present  Nada Maclachlan, BA, ACSM CEP, Exercise Physiologist;Joseph Tedd Sias, BS, ACSM CEP, Exercise Physiologist    Supervising physician immediately available to respond to emergencies  [facility] immediately available ER MD    Physician(s)  Drs. Paduchowski and Rifenbark    Medication changes reported      No    Fall or balance concerns reported     No    Tobacco Cessation  No Change    Warm-up and Cool-down  Performed as group-led instruction    Resistance Training Performed  Yes    VAD Patient?  No      Pain Assessment   Currently in Pain?  No/denies    Multiple Pain Sites  No          Social History   Tobacco Use  Smoking Status Former Smoker  . Packs/day: 1.50  . Years: 40.00  . Pack years: 60.00  . Types: Cigarettes  . Last attempt to quit: 05/21/2008  . Years since quitting: 9.3  Smokeless Tobacco Never Used    Goals Met:  Proper associated with RPD/PD & O2 Sat Independence with exercise equipment Exercise tolerated well No report of cardiac concerns or symptoms Strength training completed today  Goals Unmet:  Not Applicable  Comments: Pt able to follow exercise prescription today without complaint.  Will continue to monitor for progression.    Dr. Emily Filbert is Medical Director for [place] and [facility].

## 2017-09-15 NOTE — Progress Notes (Signed)
Pulmonary Individual Treatment Plan  Patient Details  Name: Joe Torres MRN: [id number] Date of Birth: [birth year] Referring Provider:     Pulmonary Rehab from 08/19/2017 in [facility] Cardiac and Pulmonary Rehab  Referring Provider  Jeanne Ivan MD      Initial Encounter Date:    Pulmonary Rehab from 08/19/2017 in [facility] Cardiac and Pulmonary Rehab  Date  08/19/17  Referring Provider  Jeanne Ivan MD      Visit Diagnosis: COPD with acute exacerbation [facility])  Patient's Home Medications on Admission: No current outpatient medications on file.  Past Medical History: No past medical history on file.  Tobacco Use: Social History   Tobacco Use  Smoking Status Former Smoker  . Packs/day: 1.50  . Years: 40.00  . Pack years: 60.00  . Types: Cigarettes  . Last attempt to quit: 05/21/2008  . Years since quitting: 9.3  Smokeless Tobacco Never Used    Labs: Recent Review Flowsheet Data    There is no flowsheet data to display.       Pulmonary Assessment Scores: Pulmonary Assessment Scores    Row Name 08/19/17 1132         ADL UCSD   ADL Phase  Entry     SOB Score total  92     Rest  2     Walk  3     Stairs  5     Bath  4     Dress  4     Shop  4       CAT Score   CAT Score  27       mMRC Score   mMRC Score  4        Pulmonary Function Assessment: Pulmonary Function Assessment - 08/19/17 1133      Breath   Bilateral Breath Sounds  Wheezes    Shortness of Breath  Yes;Limiting activity;Panic with Shortness of Breath       Exercise Target Goals:    Exercise Program Goal: Individual exercise prescription set using results from initial 6 min walk test and THRR while considering  patient's activity barriers and safety.    Exercise Prescription Goal: Initial exercise prescription builds to 30-45 minutes a day of aerobic activity, 2-3 days per week.  Home exercise guidelines will be given to patient during program as part of exercise prescription that the  participant will acknowledge.  Activity Barriers & Risk Stratification: Activity Barriers & Cardiac Risk Stratification - 08/19/17 1248      Activity Barriers & Cardiac Risk Stratification   Activity Barriers  Muscular Weakness;Deconditioning;Shortness of Breath;Joint Problems occasional L knee pain       6 Minute Walk: 6 Minute Walk    Row Name 08/19/17 1242         6 Minute Walk   Phase  Initial     Distance  810 feet     Walk Time  4.83 minutes     # of Rest Breaks  6 14 sec, 8 sec, 9 sec, 11 sec, 21 sec, 7 sec     MPH  1.91     METS  2.58     RPE  15     Perceived Dyspnea   4     VO2 Peak  9.02     Symptoms  Yes (comment)     Comments  SOB, hot in hallway at turn around     Resting HR  90 bpm     Resting BP  146/64     Resting Oxygen Saturation   94 %     Exercise Oxygen Saturation  during 6 min walk  87 %     Max Ex. HR  117 bpm     Max Ex. BP  164/74     2 Minute Post BP  156/62       Interval HR   1 Minute HR  107     2 Minute HR  109     3 Minute HR  114     4 Minute HR  115     5 Minute HR  116     6 Minute HR  117     2 Minute Post HR  108     Interval Heart Rate?  Yes       Interval Oxygen   Interval Oxygen?  Yes     Baseline Oxygen Saturation %  94 %     1 Minute Oxygen Saturation %  91 % rest 1:51-2:05     1 Minute Liters of Oxygen  0 L Room Air     2 Minute Oxygen Saturation %  88 % rest 2:36-2:44     2 Minute Liters of Oxygen  0 L     3 Minute Oxygen Saturation %  87 % 3:12-3:21, 3:49-4:00     3 Minute Liters of Oxygen  0 L     4 Minute Oxygen Saturation %  88 % rest 4:39-5:00     4 Minute Liters of Oxygen  0 L     5 Minute Oxygen Saturation %  88 % rest 5:23-5:30     5 Minute Liters of Oxygen  0 L     6 Minute Oxygen Saturation %  89 %     6 Minute Liters of Oxygen  0 L     2 Minute Post Oxygen Saturation %  93 %     2 Minute Post Liters of Oxygen  0 L       Oxygen Initial Assessment: Oxygen Initial Assessment - 08/19/17 1139       Home Oxygen   Home Oxygen Device  Home Concentrator;E-Tanks    Sleep Oxygen Prescription  CPAP    Liters per minute  4    Home Exercise Oxygen Prescription  Continuous    Liters per minute  3    Home at Rest Exercise Oxygen Prescription  Continuous    Liters per minute  3    Compliance with Home Oxygen Use  Yes      Initial 6 min Walk   Oxygen Used  None      Program Oxygen Prescription   Program Oxygen Prescription  None      Intervention   Short Term Goals  To learn and exhibit compliance with exercise, home and travel O2 prescription;To learn and understand importance of maintaining oxygen saturations>88%;To learn and demonstrate proper use of respiratory medications;To learn and demonstrate proper pursed lip breathing techniques or other breathing techniques.;To learn and understand importance of monitoring SPO2 with pulse oximeter and demonstrate accurate use of the pulse oximeter. Nebulizers albuterol BID. Takes 3 medications BID    Long  Term Goals  Exhibits compliance with exercise, home and travel O2 prescription;Verbalizes importance of monitoring SPO2 with pulse oximeter and return demonstration;Maintenance of O2 saturations>88%;Exhibits proper breathing techniques, such as pursed lip breathing or other method taught during program session;Compliance with respiratory medication;Demonstrates proper use of MDI's  Oxygen Re-Evaluation: Oxygen Re-Evaluation    Row Name 08/25/17 1029             Program Oxygen Prescription   Program Oxygen Prescription  None         Home Oxygen   Home Oxygen Device  Home Concentrator;E-Tanks       Sleep Oxygen Prescription  CPAP       Liters per minute  4       Home Exercise Oxygen Prescription  Continuous       Liters per minute  3       Home at Rest Exercise Oxygen Prescription  Continuous       Liters per minute  3       Compliance with Home Oxygen Use  Yes         Goals/Expected Outcomes   Short Term Goals  To learn and  exhibit compliance with exercise, home and travel O2 prescription;To learn and understand importance of maintaining oxygen saturations>88%;To learn and demonstrate proper use of respiratory medications;To learn and demonstrate proper pursed lip breathing techniques or other breathing techniques.;To learn and understand importance of monitoring SPO2 with pulse oximeter and demonstrate accurate use of the pulse oximeter.       Long  Term Goals  Exhibits compliance with exercise, home and travel O2 prescription;Verbalizes importance of monitoring SPO2 with pulse oximeter and return demonstration;Maintenance of O2 saturations>88%;Exhibits proper breathing techniques, such as pursed lip breathing or other method taught during program session;Compliance with respiratory medication;Demonstrates proper use of MDI's       Comments  Reviewed PLB technique with pt.  Talked about how it work and it's important to maintaining his exercise saturations.         Goals/Expected Outcomes  Short: Become more profiecient at using PLB.   Long: Become independent at using PLB.          Oxygen Discharge (Final Oxygen Re-Evaluation): Oxygen Re-Evaluation - 08/25/17 1029      Program Oxygen Prescription   Program Oxygen Prescription  None      Home Oxygen   Home Oxygen Device  Home Concentrator;E-Tanks    Sleep Oxygen Prescription  CPAP    Liters per minute  4    Home Exercise Oxygen Prescription  Continuous    Liters per minute  3    Home at Rest Exercise Oxygen Prescription  Continuous    Liters per minute  3    Compliance with Home Oxygen Use  Yes      Goals/Expected Outcomes   Short Term Goals  To learn and exhibit compliance with exercise, home and travel O2 prescription;To learn and understand importance of maintaining oxygen saturations>88%;To learn and demonstrate proper use of respiratory medications;To learn and demonstrate proper pursed lip breathing techniques or other breathing techniques.;To learn  and understand importance of monitoring SPO2 with pulse oximeter and demonstrate accurate use of the pulse oximeter.    Long  Term Goals  Exhibits compliance with exercise, home and travel O2 prescription;Verbalizes importance of monitoring SPO2 with pulse oximeter and return demonstration;Maintenance of O2 saturations>88%;Exhibits proper breathing techniques, such as pursed lip breathing or other method taught during program session;Compliance with respiratory medication;Demonstrates proper use of MDI's    Comments  Reviewed PLB technique with pt.  Talked about how it work and it's important to maintaining his exercise saturations.      Goals/Expected Outcomes  Short: Become more profiecient at using PLB.   Long: Become independent at using PLB.  Initial Exercise Prescription: Initial Exercise Prescription - 08/19/17 1200      Date of Initial Exercise RX and Referring Provider   Date  08/19/17    Referring Provider  Jeanne Ivan MD      Treadmill   MPH  1.7    Grade  0.5    Minutes  15    METs  2.42      Recumbant Elliptical   Level  1    RPM  50    Minutes  15    METs  2.5      T5 Nustep   Level  1    SPM  80    Minutes  15    METs  2.5      Prescription Details   Frequency (times per week)  3    Duration  Progress to 45 minutes of aerobic exercise without signs/symptoms of physical distress      Intensity   THRR 40-80% of Max Heartrate  114-139    Ratings of Perceived Exertion  11-13    Perceived Dyspnea  0-4      Progression   Progression  Continue to progress workloads to maintain intensity without signs/symptoms of physical distress.      Resistance Training   Training Prescription  Yes    Weight  4lbs    Reps  10-15       Perform Capillary Blood Glucose checks as needed.  Exercise Prescription Changes: Exercise Prescription Changes    Row Name 08/19/17 1200 09/03/17 1100           Response to Exercise   Blood Pressure (Admit)  146/64  122/80       Blood Pressure (Exercise)  164/74  156/74      Blood Pressure (Exit)  156/62  130/76      Heart Rate (Admit)  90 bpm  95 bpm      Heart Rate (Exercise)  117 bpm  115 bpm      Heart Rate (Exit)  108 bpm  107 bpm      Oxygen Saturation (Admit)  94 %  92 %      Oxygen Saturation (Exercise)  87 %  91 %      Oxygen Saturation (Exit)  93 %  93 %      Rating of Perceived Exertion (Exercise)  15  13      Perceived Dyspnea (Exercise)  4  3      Symptoms  SOB, hot in hall at turn  none      Comments  walk test results  -      Duration  -  Continue with 45 min of aerobic exercise without signs/symptoms of physical distress.      Intensity  -  THRR unchanged        Resistance Training   Training Prescription  -  Yes      Weight  -  4 lb      Reps  -  10-15        Treadmill   MPH  -  1      Grade  -  0      Minutes  -  15      METs  -  1.77        Recumbant Elliptical   Level  -  1      RPM  -  50      Minutes  -  15  METs  -  1.5        T5 Nustep   Level  -  2      SPM  -  80      Minutes  -  15      METs  -  2.5         Exercise Comments: Exercise Comments    Row Name 08/25/17 1028           Exercise Comments  First full day of exercise!  Patient was oriented to gym and equipment including functions, settings, policies, and procedures.  Patient's individual exercise prescription and treatment plan were reviewed.  All starting workloads were established based on the results of the 6 minute walk test done at initial orientation visit.  The plan for exercise progression was also introduced and progression will be customized based on patient's performance and goals.          Exercise Goals and Review: Exercise Goals    Row Name 08/19/17 1251             Exercise Goals   Increase Physical Activity  Yes       Intervention  Provide advice, education, support and counseling about physical activity/exercise needs.;Develop an individualized exercise prescription for  aerobic and resistive training based on initial evaluation findings, risk stratification, comorbidities and participant's personal goals.       Expected Outcomes  Short Term: Attend rehab on a regular basis to increase amount of physical activity.;Long Term: Add in home exercise to make exercise part of routine and to increase amount of physical activity.;Long Term: Exercising regularly at least 3-5 days a week.       Increase Strength and Stamina  Yes       Intervention  Provide advice, education, support and counseling about physical activity/exercise needs.;Develop an individualized exercise prescription for aerobic and resistive training based on initial evaluation findings, risk stratification, comorbidities and participant's personal goals.       Expected Outcomes  Short Term: Increase workloads from initial exercise prescription for resistance, speed, and METs.;Short Term: Perform resistance training exercises routinely during rehab and add in resistance training at home;Long Term: Improve cardiorespiratory fitness, muscular endurance and strength as measured by increased METs and functional capacity (6MWT)       Able to understand and use rate of perceived exertion (RPE) scale  Yes       Intervention  Provide education and explanation on how to use RPE scale       Expected Outcomes  Short Term: Able to use RPE daily in rehab to express subjective intensity level;Long Term:  Able to use RPE to guide intensity level when exercising independently       Able to understand and use Dyspnea scale  Yes       Intervention  Provide education and explanation on how to use Dyspnea scale       Expected Outcomes  Short Term: Able to use Dyspnea scale daily in rehab to express subjective sense of shortness of breath during exertion;Long Term: Able to use Dyspnea scale to guide intensity level when exercising independently       Knowledge and understanding of Target Heart Rate Range (THRR)  Yes        Intervention  Provide education and explanation of THRR including how the numbers were predicted and where they are located for reference       Expected Outcomes  Short Term: Able to state/look up THRR;Short  Term: Able to use daily as guideline for intensity in rehab;Long Term: Able to use THRR to govern intensity when exercising independently       Able to check pulse independently  Yes       Intervention  Provide education and demonstration on how to check pulse in carotid and radial arteries.;Review the importance of being able to check your own pulse for safety during independent exercise       Expected Outcomes  Short Term: Able to explain why pulse checking is important during independent exercise;Long Term: Able to check pulse independently and accurately       Understanding of Exercise Prescription  Yes       Intervention  Provide education, explanation, and written materials on patient's individual exercise prescription       Expected Outcomes  Short Term: Able to explain program exercise prescription;Long Term: Able to explain home exercise prescription to exercise independently          Exercise Goals Re-Evaluation : Exercise Goals Re-Evaluation    Row Name 08/25/17 1028 09/03/17 1133           Exercise Goal Re-Evaluation   Exercise Goals Review  Understanding of Exercise Prescription;Able to understand and use Dyspnea scale;Knowledge and understanding of Target Heart Rate Range (THRR);Able to understand and use rate of perceived exertion (RPE) scale  Increase Physical Activity;Able to understand and use rate of perceived exertion (RPE) scale;Increase Strength and Stamina;Able to understand and use Dyspnea scale      Comments  Reviewed RPE scale, THR and program prescription with pt today.  Pt voiced understanding and was given a copy of goals to take home.   Rolla is tolerating exercise well.  Staff will continue to monitor.      Expected Outcomes  Short: Use RPE daily to regulate  intensity.  Long: Follow program prescription in THR.  Short - [place] will attend class regularly  [place] will improve overall fitness level         Discharge Exercise Prescription (Final Exercise Prescription Changes): Exercise Prescription Changes - 09/03/17 1100      Response to Exercise   Blood Pressure (Admit)  122/80    Blood Pressure (Exercise)  156/74    Blood Pressure (Exit)  130/76    Heart Rate (Admit)  95 bpm    Heart Rate (Exercise)  115 bpm    Heart Rate (Exit)  107 bpm    Oxygen Saturation (Admit)  92 %    Oxygen Saturation (Exercise)  91 %    Oxygen Saturation (Exit)  93 %    Rating of Perceived Exertion (Exercise)  13    Perceived Dyspnea (Exercise)  3    Symptoms  none    Duration  Continue with 45 min of aerobic exercise without signs/symptoms of physical distress.    Intensity  THRR unchanged      Resistance Training   Training Prescription  Yes    Weight  4 lb    Reps  10-15      Treadmill   MPH  1    Grade  0    Minutes  15    METs  1.77      Recumbant Elliptical   Level  1    RPM  50    Minutes  15    METs  1.5      T5 Nustep   Level  2    SPM  80    Minutes  15    METs  2.5       Nutrition:  Target Goals: Understanding of nutrition guidelines, daily intake of sodium <1543m, cholesterol <2099m calories 30% from fat and 7% or less from saturated fats, daily to have 5 or more servings of fruits and vegetables.  Biometrics: Pre Biometrics - 08/19/17 1252      Pre Biometrics   Height  5' 6.1" (1.679 m)    Weight  166 lb (75.3 kg)    Waist Circumference  39 inches    Hip Circumference  38 inches    Waist to Hip Ratio  1.03 %    BMI (Calculated)  26.71        Nutrition Therapy Plan and Nutrition Goals: Nutrition Therapy & Goals - 08/19/17 1130      Personal Nutrition Goals   Comments  Learn to diet better to help with his COPD, to learn portion control.      Intervention Plan   Intervention  Prescribe, educate and counsel  regarding individualized specific dietary modifications aiming towards targeted core components such as weight, hypertension, lipid management, diabetes, heart failure and other comorbidities.    Expected Outcomes  Short Term Goal: Understand basic principles of dietary content, such as calories, fat, sodium, cholesterol and nutrients.;Long Term Goal: Adherence to prescribed nutrition plan.       Nutrition Assessments: Nutrition Assessments - 08/19/17 1211      MEDFICTS Scores   Pre Score  50       Nutrition Goals Re-Evaluation:   Nutrition Goals Discharge (Final Nutrition Goals Re-Evaluation):   Psychosocial: Target Goals: Acknowledge presence or absence of significant depression and/or stress, maximize coping skills, provide positive support system. Participant is able to verbalize types and ability to use techniques and skills needed for reducing stress and depression.   Initial Review & Psychosocial Screening: Initial Psych Review & Screening - 08/19/17 1127      Initial Review   Current issues with  Current Stress Concerns    Source of Stress Concerns  Chronic Illness    Comments  He is not able to do yard work and be outside like he used to.      Family Dynamics   Good Support System?  Yes    Comments  His daughter is a good support system      Barriers   Psychosocial barriers to participate in program  The patient should benefit from training in stress management and relaxation.      Screening Interventions   Interventions  Encouraged to exercise;Program counselor consult;To provide support and resources with identified psychosocial needs;Provide feedback about the scores to participant    Expected Outcomes  Short Term goal: Utilizing psychosocial counselor, staff and physician to assist with identification of specific Stressors or current issues interfering with healing process. Setting desired goal for each stressor or current issue identified.;Long Term Goal:  Stressors or current issues are controlled or eliminated.;Short Term goal: Identification and review with participant of any Quality of Life or Depression concerns found by scoring the questionnaire.;Long Term goal: The participant improves quality of Life and PHQ9 Scores as seen by post scores and/or verbalization of changes       Quality of Life Scores:  Scores of 19 and below usually indicate a poorer quality of life in these areas.  A difference of  2-3 points is a clinically meaningful difference.  A difference of 2-3 points in the total score of the Quality of Life Index has been  associated with significant improvement in overall quality of life, self-image, physical symptoms, and general health in studies assessing change in quality of life.  PHQ-9: Recent Review Flowsheet Data    Depression screen [facility] 2/9 08/19/2017   Decreased Interest 2   Down, Depressed, Hopeless 1   PHQ - 2 Score 3   Altered sleeping 1   Tired, decreased energy 1   Change in appetite 0   Feeling bad or failure about yourself  0   Trouble concentrating 1   Moving slowly or fidgety/restless 0   Suicidal thoughts 0   PHQ-9 Score 6   Difficult doing work/chores Somewhat difficult     Interpretation of Total Score  Total Score Depression Severity:  1-4 = Minimal depression, 5-9 = Mild depression, 10-14 = Moderate depression, 15-19 = Moderately severe depression, 20-27 = Severe depression   Psychosocial Evaluation and Intervention: Psychosocial Evaluation - 08/27/17 1214      Psychosocial Evaluation & Interventions   Interventions  Encouraged to exercise with the program and follow exercise prescription    Comments  Counselor met with Mr. Jury [facility]) along with his Spanish interpreter today for the initial psychosocial evaluation.  He is a 72 year old who struggles with COPD.  Noam has a strong support system with a spouse; daughter and grandchildren who live locally.  He considers himself in good health mostly;  and he sleeps well and has a "super" appetite.  Khalif denies a history of depression or anxiety and states he is typically in a positive mood.  He reports some stress with his health condition and not being able to do the activities he used to enjoy.  Yon has goals for this program to feel better and breathe better.      Expected Outcomes  Short:  John will exercise for stress and health benefits.   Long:  [place] will continue to exercise consistently to improve his quality of life.      Continue Psychosocial Services   Follow up required by staff       Psychosocial Re-Evaluation:   Psychosocial Discharge (Final Psychosocial Re-Evaluation):   Education: Education Goals: Education classes will be provided on a weekly basis, covering required topics. Participant will state understanding/return demonstration of topics presented.  Learning Barriers/Preferences: Learning Barriers/Preferences - 08/19/17 1136      Learning Barriers/Preferences   Learning Barriers  Sight;Language;Exercise Concerns wears glasses    Learning Preferences  Individual Instruction       Education Topics:  Initial Evaluation Education: - Verbal, written and demonstration of respiratory meds, oximetry and breathing techniques. Instruction on use of nebulizers and MDIs and importance of monitoring MDI activations.   Pulmonary Rehab from 09/10/2017 in [facility] Cardiac and Pulmonary Rehab  Date  08/19/17  Educator  [facility]  Instruction Review Code  1- Verbalizes Understanding      General Nutrition Guidelines/Fats and Fiber: -Group instruction provided by verbal, written material, models and posters to present the general guidelines for heart healthy nutrition. Gives an explanation and review of dietary fats and fiber.   Controlling Sodium/Reading Food Labels: -Group verbal and written material supporting the discussion of sodium use in heart healthy nutrition. Review and explanation with models, verbal and written materials  for utilization of the food label.   Exercise Physiology & General Exercise Guidelines: - Group verbal and written instruction with models to review the exercise physiology of the cardiovascular system and associated critical values. Provides general exercise guidelines with specific guidelines to those with  heart or lung disease.    Aerobic Exercise & Resistance Training: - Gives group verbal and written instruction on the various components of exercise. Focuses on aerobic and resistive training programs and the benefits of this training and how to safely progress through these programs.   Pulmonary Rehab from 09/10/2017 in [facility] Cardiac and Pulmonary Rehab  Date  09/05/17  Educator  [facility]  Instruction Review Code  1- Verbalizes Understanding      Flexibility, Balance, Mind/Body Relaxation: Provides group verbal/written instruction on the benefits of flexibility and balance training, including mind/body exercise modes such as yoga, pilates and tai chi.  Demonstration and skill practice provided.   Stress and Anxiety: - Provides group verbal and written instruction about the health risks of elevated stress and causes of high stress.  Discuss the correlation between heart/lung disease and anxiety and treatment options. Review healthy ways to manage with stress and anxiety.   Depression: - Provides group verbal and written instruction on the correlation between heart/lung disease and depressed mood, treatment options, and the stigmas associated with seeking treatment.   Exercise & Equipment Safety: - Individual verbal instruction and demonstration of equipment use and safety with use of the equipment.   Pulmonary Rehab from 09/10/2017 in [facility] Cardiac and Pulmonary Rehab  Date  08/19/17  Educator  [facility]  Instruction Review Code  1- Verbalizes Understanding      Infection Prevention: - Provides verbal and written material to individual with discussion of infection control including proper hand  washing and proper equipment cleaning during exercise session.   Pulmonary Rehab from 09/10/2017 in [facility] Cardiac and Pulmonary Rehab  Date  08/19/17  Educator  [facility]  Instruction Review Code  1- Verbalizes Understanding      Falls Prevention: - Provides verbal and written material to individual with discussion of falls prevention and safety.   Pulmonary Rehab from 09/10/2017 in [facility] Cardiac and Pulmonary Rehab  Date  08/19/17  Educator  [facility]  Instruction Review Code  1- Verbalizes Understanding      Diabetes: - Individual verbal and written instruction to review signs/symptoms of diabetes, desired ranges of glucose level fasting, after meals and with exercise. Advice that pre and post exercise glucose checks will be done for 3 sessions at entry of program.   Chronic Lung Diseases: - Group verbal and written instruction to review updates, respiratory medications, advancements in procedures and treatments. Discuss use of supplemental oxygen including available portable oxygen systems, continuous and intermittent flow rates, concentrators, personal use and safety guidelines. Review proper use of inhaler and spacers. Provide informative websites for self-education.    Energy Conservation: - Provide group verbal and written instruction for methods to conserve energy, plan and organize activities. Instruct on pacing techniques, use of adaptive equipment and posture/positioning to relieve shortness of breath.   Triggers and Exacerbations: - Group verbal and written instruction to review types of environmental triggers and ways to prevent exacerbations. Discuss weather changes, air quality and the benefits of nasal washing. Review warning signs and symptoms to help prevent infections. Discuss techniques for effective airway clearance, coughing, and vibrations.   AED/CPR: - Group verbal and written instruction with the use of models to demonstrate the basic use of the AED with the basic ABC's of  resuscitation.   Anatomy and Physiology of the Lungs: - Group verbal and written instruction with the use of models to provide basic lung anatomy and physiology related to function, structure and complications of lung disease.   Anatomy &  Physiology of the Heart: - Group verbal and written instruction and models provide basic cardiac anatomy and physiology, with the coronary electrical and arterial systems. Review of Valvular disease and Heart Failure   Pulmonary Rehab from 09/10/2017 in [facility] Cardiac and Pulmonary Rehab  Date  09/10/17  Educator  [facility]  Instruction Review Code  1- Verbalizes Understanding      Cardiac Medications: - Group verbal and written instruction to review commonly prescribed medications for heart disease. Reviews the medication, class of the drug, and side effects.   Know Your Numbers and Risk Factors: -Group verbal and written instruction about important numbers in your health.  Discussion of what are risk factors and how they play a role in the disease process.  Review of Cholesterol, Blood Pressure, Diabetes, and BMI and the role they play in your overall health.   Sleep Hygiene: -Provides group verbal and written instruction about how sleep can affect your health.  Define sleep hygiene, discuss sleep cycles and impact of sleep habits. Review good sleep hygiene tips.    Pulmonary Rehab from 09/10/2017 in [facility] Cardiac and Pulmonary Rehab  Date  08/27/17  Educator  [facility]  Instruction Review Code  1- Verbalizes Understanding      Other: -Provides group and verbal instruction on various topics (see comments)    Knowledge Questionnaire Score: Knowledge Questionnaire Score - 08/19/17 1135      Knowledge Questionnaire Score   Pre Score  16/18 reviewed with patient        Core Components/Risk Factors/Patient Goals at Admission: Personal Goals and Risk Factors at Admission - 08/19/17 1144      Core Components/Risk Factors/Patient Goals on Admission    Weight  Management  Yes;Weight Maintenance;Weight Loss    Intervention  Weight Management: Develop a combined nutrition and exercise program designed to reach desired caloric intake, while maintaining appropriate intake of nutrient and fiber, sodium and fats, and appropriate energy expenditure required for the weight goal.;Weight Management: Provide education and appropriate resources to help participant work on and attain dietary goals.;Weight Management/Obesity: Establish reasonable short term and long term weight goals.    Admit Weight  166 lb (75.3 kg)    Goal Weight: Short Term  161 lb (73 kg)    Goal Weight: Long Term  155 lb (70.3 kg)    Expected Outcomes  Short Term: Continue to assess and modify interventions until short term weight is achieved;Long Term: Adherence to nutrition and physical activity/exercise program aimed toward attainment of established weight goal;Weight Maintenance: Understanding of the daily nutrition guidelines, which includes 25-35% calories from fat, 7% or less cal from saturated fats, less than 213m cholesterol, less than 1.5gm of sodium, & 5 or more servings of fruits and vegetables daily;Understanding recommendations for meals to include 15-35% energy as protein, 25-35% energy from fat, 35-60% energy from carbohydrates, less than 2017mof dietary cholesterol, 20-35 gm of total fiber daily;Understanding of distribution of calorie intake throughout the day with the consumption of 4-5 meals/snacks;Weight Loss: Understanding of general recommendations for a balanced deficit meal plan, which promotes 1-2 lb weight loss per week and includes a negative energy balance of [phone] kcal/d    Improve shortness of breath with ADL's  Yes    Intervention  Provide education, individualized exercise plan and daily activity instruction to help decrease symptoms of SOB with activities of daily living.    Expected Outcomes  Short Term: Improve cardiorespiratory fitness to achieve a reduction of  symptoms when performing ADLs;Long Term: Be  able to perform more ADLs without symptoms or delay the onset of symptoms    Hypertension  Yes    Intervention  Provide education on lifestyle modifcations including regular physical activity/exercise, weight management, moderate sodium restriction and increased consumption of fresh fruit, vegetables, and low fat dairy, alcohol moderation, and smoking cessation.;Monitor prescription use compliance. takes medication for blood pressure    Expected Outcomes  Short Term: Continued assessment and intervention until BP is < 140/109m HG in hypertensive participants. < 130/86mHG in hypertensive participants with diabetes, heart failure or chronic kidney disease.;Long Term: Maintenance of blood pressure at goal levels. at home 140-145/80's.    Lipids  Yes    Intervention  Provide education and support for participant on nutrition & aerobic/resistive exercise along with prescribed medications to achieve LDL <7059mHDL >17m64m  Expected Outcomes  Short Term: Participant states understanding of desired cholesterol values and is compliant with medications prescribed. Participant is following exercise prescription and nutrition guidelines.;Long Term: Cholesterol controlled with medications as prescribed, with individualized exercise RX and with personalized nutrition plan. Value goals: LDL < 70mg41mL > 40 mg.       Core Components/Risk Factors/Patient Goals Review:    Core Components/Risk Factors/Patient Goals at Discharge (Final Review):    ITP Comments: ITP Comments    Row Name 08/19/17 1046 09/15/17 0827         ITP Comments  Medical Evaluation completed. Chart sent for review and changes to Dr. Mark Emily Filbertctor of [facility]osis can be found in [facility] encounter 08/01/17   30 day review completed. ITP sent to Dr. Mark Emily Filbertctor of [facility] with ITP unless changes are made by physician         Comments: 30 day review

## 2017-09-19 ENCOUNTER — Encounter: Payer: Medicare HMO | Admitting: *Deleted

## 2017-09-19 DIAGNOSIS — J441 Chronic obstructive pulmonary disease with (acute) exacerbation: Secondary | ICD-10-CM | POA: Diagnosis not present

## 2017-09-19 NOTE — Progress Notes (Signed)
Daily Session Note  Patient Details  Name: Joe Torres MRN: [id number] Date of Birth: 1945-09-18 Referring Provider:     Pulmonary Rehab from 08/19/2017 in [facility] Cardiac and Pulmonary Rehab  Referring Provider  Jeanne Ivan MD      Encounter Date: 09/19/2017  Check In: Session Check In - 09/19/17 1041      Check-In   Location  [facility]-Cardiac & Pulmonary Rehab    Staff Present  Renita Papa, RN Vickki Hearing, BA, ACSM CEP, Exercise Physiologist Constance Goltz RN    Supervising physician immediately available to respond to emergencies  [facility] immediately available ER MD    Physician(s)  Dr. Clearnce Hasten and Alfred Levins    Medication changes reported      No    Fall or balance concerns reported     No    Tobacco Cessation  No Change    Warm-up and Cool-down  Performed as group-led instruction    Resistance Training Performed  Yes    VAD Patient?  No      Pain Assessment   Currently in Pain?  No/denies          Social History   Tobacco Use  Smoking Status Former Smoker  . Packs/day: 1.50  . Years: 40.00  . Pack years: 60.00  . Types: Cigarettes  . Last attempt to quit: 05/21/2008  . Years since quitting: 9.3  Smokeless Tobacco Never Used    Goals Met:  Proper associated with RPD/PD & O2 Sat Independence with exercise equipment Using PLB without cueing & demonstrates good technique Exercise tolerated well No report of cardiac concerns or symptoms Strength training completed today  Goals Unmet:  Not Applicable  Comments: Pt able to follow exercise prescription today without complaint.  Will continue to monitor for progression.    Dr. Emily Filbert is Medical Director for [place] and [facility].

## 2017-09-22 ENCOUNTER — Encounter: Payer: Medicare HMO | Admitting: *Deleted

## 2017-09-22 DIAGNOSIS — J441 Chronic obstructive pulmonary disease with (acute) exacerbation: Secondary | ICD-10-CM | POA: Diagnosis not present

## 2017-09-22 NOTE — Progress Notes (Signed)
Daily Session Note  Patient Details  Name: LYDON VANSICKLE MRN: [id number] Date of Birth: 1946-02-19 Referring Provider:     Pulmonary Rehab from 08/19/2017 in [facility] Cardiac and Pulmonary Rehab  Referring Provider  Jeanne Ivan MD      Encounter Date: 09/22/2017  Check In: Session Check In - 09/22/17 1032      Check-In   Location  [facility]-Cardiac & Pulmonary Rehab    Staff Present  Nyoka Cowden, RN, BSN, Kela Millin, BA, ACSM CEP, Exercise Physiologist;Merrill Villarruel Amedeo Plenty, BS, ACSM CEP, Exercise Physiologist    Supervising physician immediately available to respond to emergencies  [facility] immediately available ER MD    Physician(s)  Dr. Corky Downs and Dr. Alfred Levins    Medication changes reported      No    Fall or balance concerns reported     No    Tobacco Cessation  No Change    Warm-up and Cool-down  Performed as group-led instruction    Resistance Training Performed  Yes    VAD Patient?  No      Pain Assessment   Currently in Pain?  No/denies    Multiple Pain Sites  No          Social History   Tobacco Use  Smoking Status Former Smoker  . Packs/day: 1.50  . Years: 40.00  . Pack years: 60.00  . Types: Cigarettes  . Last attempt to quit: 05/21/2008  . Years since quitting: 9.3  Smokeless Tobacco Never Used    Goals Met:  Proper associated with RPD/PD & O2 Sat Independence with exercise equipment Exercise tolerated well No report of cardiac concerns or symptoms Strength training completed today  Goals Unmet:  Not Applicable  Comments: Pt able to follow exercise prescription today without complaint.  Will continue to monitor for progression.    Dr. Emily Filbert is Medical Director for [place] and [facility].

## 2017-09-24 DIAGNOSIS — J441 Chronic obstructive pulmonary disease with (acute) exacerbation: Secondary | ICD-10-CM

## 2017-09-24 NOTE — Progress Notes (Signed)
Daily Session Note  Patient Details  Name: Joe Torres MRN: [id number] Date of Birth: 10-15-1945 Referring Provider:     Pulmonary Rehab from 08/19/2017 in [facility] Cardiac and Pulmonary Rehab  Referring Provider  Jeanne Ivan MD      Encounter Date: 09/24/2017  Check In: Session Check In - 09/24/17 1018      Check-In   Location  [facility]-Cardiac & Pulmonary Rehab    Staff Present  Nyoka Cowden, RN, BSN, Willette Pa, MA, RCEP, CCRP, Exercise Physiologist;Amanda Oletta Darter, [place], ACSM CEP, Exercise Physiologist    Supervising physician immediately available to respond to emergencies  [facility] immediately available ER MD    Physician(s)  Jimmye Norman and Quentin Cornwall    Medication changes reported      No    Warm-up and Cool-down  Performed as group-led Higher education careers adviser Performed  Yes    VAD Patient?  No      Pain Assessment   Currently in Pain?  No/denies    Multiple Pain Sites  No          Social History   Tobacco Use  Smoking Status Former Smoker  . Packs/day: 1.50  . Years: 40.00  . Pack years: 60.00  . Types: Cigarettes  . Last attempt to quit: 05/21/2008  . Years since quitting: 9.3  Smokeless Tobacco Never Used    Goals Met:  Proper associated with RPD/PD & O2 Sat Exercise tolerated well No report of cardiac concerns or symptoms Strength training completed today  Goals Unmet:  Not Applicable  Comments: Pt able to follow exercise prescription today without complaint.  Will continue to monitor for progression.    Dr. Emily Filbert is Medical Director for [place] and [facility].

## 2017-09-29 ENCOUNTER — Encounter: Payer: Medicare HMO | Admitting: *Deleted

## 2017-09-29 DIAGNOSIS — J441 Chronic obstructive pulmonary disease with (acute) exacerbation: Secondary | ICD-10-CM | POA: Diagnosis not present

## 2017-09-29 NOTE — Progress Notes (Signed)
Daily Session Note  Patient Details  Name: Joe Torres MRN: [id number] Date of Birth: [birth year] Referring Provider:     Pulmonary Rehab from 08/19/2017 in [facility] Cardiac and Pulmonary Rehab  Referring Provider  Jeanne Ivan MD      Encounter Date: 09/29/2017  Check In: Session Check In - 09/29/17 1007      Check-In   Location  [facility]-Cardiac & Pulmonary Rehab    Staff Present  Earlean Shawl, BS, ACSM CEP, Exercise Physiologist;Susanne Bice, RN, BSN, Lance Sell, BA, ACSM CEP, Exercise Physiologist    Supervising physician immediately available to respond to emergencies  [facility] immediately available ER MD    Physician(s)  Drs. Schaevitz and Williams    Medication changes reported      No    Fall or balance concerns reported     No    Warm-up and Cool-down  Performed as group-led Higher education careers adviser Performed  Yes    VAD Patient?  No      Pain Assessment   Currently in Pain?  No/denies          Social History   Tobacco Use  Smoking Status Former Smoker  . Packs/day: 1.50  . Years: 40.00  . Pack years: 60.00  . Types: Cigarettes  . Last attempt to quit: 05/21/2008  . Years since quitting: 9.3  Smokeless Tobacco Never Used    Goals Met:  Proper associated with RPD/PD & O2 Sat Independence with exercise equipment Using PLB without cueing & demonstrates good technique Personal goals reviewed No report of cardiac concerns or symptoms Strength training completed today  Goals Unmet:  Not Applicable  Comments: Pt able to follow exercise prescription today without complaint.  Will continue to monitor for progression.    Dr. Emily Filbert is Medical Director for [place] and [facility].

## 2017-10-01 ENCOUNTER — Encounter: Payer: Medicare HMO | Admitting: *Deleted

## 2017-10-01 DIAGNOSIS — J441 Chronic obstructive pulmonary disease with (acute) exacerbation: Secondary | ICD-10-CM | POA: Diagnosis not present

## 2017-10-01 NOTE — Progress Notes (Signed)
Daily Session Note  Patient Details  Name: Joe Torres MRN: [id number] Date of Birth: [birth year] Referring Provider:     Pulmonary Rehab from 08/19/2017 in [facility] Cardiac and Pulmonary Rehab  Referring Provider  Jeanne Ivan MD      Encounter Date: 10/01/2017  Check In: Session Check In - 10/01/17 1049      Check-In   Location  [facility]-Cardiac & Pulmonary Rehab    Staff Present  Alberteen Sam, MA, RCEP, CCRP, Exercise Physiologist;Niomie Englert Sherryll Burger, RN Vickki Hearing, BA, ACSM CEP, Exercise Physiologist    Supervising physician immediately available to respond to emergencies  [facility] immediately available ER MD    Physician(s)   Dr. Mariea Clonts and Alfred Levins     Medication changes reported      No    Fall or balance concerns reported     No    Tobacco Cessation  No Change    Warm-up and Cool-down  Performed as group-led instruction    Resistance Training Performed  Yes    VAD Patient?  No      Pain Assessment   Currently in Pain?  No/denies        Exercise Prescription Changes - 09/30/17 1300      Response to Exercise   Blood Pressure (Admit)  140/72    Blood Pressure (Exit)  130/84    Heart Rate (Admit)  90 bpm    Heart Rate (Exercise)  107 bpm    Heart Rate (Exit)  91 bpm    Oxygen Saturation (Admit)  90 %    Oxygen Saturation (Exercise)  91 %    Oxygen Saturation (Exit)  90 %    Rating of Perceived Exertion (Exercise)  15    Perceived Dyspnea (Exercise)  3    Symptoms  none    Duration  Continue with 45 min of aerobic exercise without signs/symptoms of physical distress.    Intensity  THRR unchanged      Progression   Progression  Continue to progress workloads to maintain intensity without signs/symptoms of physical distress.    Average METs  1.9      Resistance Training   Training Prescription  Yes    Weight  4 lb    Reps  10-15      Interval Training   Interval Training  No      Treadmill   MPH  1.7    Grade  0    Minutes  15    METs  2.3      Recumbant Elliptical   Level  1    Minutes  15    METs  1.5       Social History   Tobacco Use  Smoking Status Former Smoker  . Packs/day: 1.50  . Years: 40.00  . Pack years: 60.00  . Types: Cigarettes  . Last attempt to quit: 05/21/2008  . Years since quitting: 9.3  Smokeless Tobacco Never Used    Goals Met:  Proper associated with RPD/PD & O2 Sat Independence with exercise equipment Using PLB without cueing & demonstrates good technique Exercise tolerated well No report of cardiac concerns or symptoms Strength training completed today  Goals Unmet:  Not Applicable  Comments: Pt able to follow exercise prescription today without complaint.  Will continue to monitor for progression.    Dr. Emily Filbert is Medical Director for [place] and [facility].

## 2017-10-03 ENCOUNTER — Encounter: Payer: Medicare HMO | Admitting: *Deleted

## 2017-10-03 DIAGNOSIS — J441 Chronic obstructive pulmonary disease with (acute) exacerbation: Secondary | ICD-10-CM

## 2017-10-03 NOTE — Progress Notes (Signed)
Daily Session Note  Patient Details  Name: Joe Torres MRN: [id number] Date of Birth: 12-17-1945 Referring Provider:     Pulmonary Rehab from 08/19/2017 in [facility] Cardiac and Pulmonary Rehab  Referring Provider  Jeanne Ivan MD      Encounter Date: 10/03/2017  Check In: Session Check In - 10/03/17 1149      Check-In   Location  [facility]-Cardiac & Pulmonary Rehab    Staff Present  Constance Goltz, RN Vickki Hearing, BA, ACSM CEP, Exercise Physiologist;Krista Frederico Hamman, RN BSN    Supervising physician immediately available to respond to emergencies  [facility] immediately available ER MD    Physician(s)  Drs. Malinda and Williams          Social History   Tobacco Use  Smoking Status Former Smoker  . Packs/day: 1.50  . Years: 40.00  . Pack years: 60.00  . Types: Cigarettes  . Last attempt to quit: 05/21/2008  . Years since quitting: 9.3  Smokeless Tobacco Never Used    Goals Met:  Proper associated with RPD/PD & O2 Sat Independence with exercise equipment Exercise tolerated well Personal goals reviewed No report of cardiac concerns or symptoms Strength training completed today  Goals Unmet:  Not Applicable  Comments: Pt able to follow exercise prescription today without complaint.  Will continue to monitor for progression. See ITP for goal review   Dr. Emily Filbert is Medical Director for [place] and [facility].

## 2017-10-13 ENCOUNTER — Encounter: Payer: Medicare HMO | Attending: Pulmonary Disease

## 2017-10-13 DIAGNOSIS — J441 Chronic obstructive pulmonary disease with (acute) exacerbation: Secondary | ICD-10-CM

## 2017-10-13 NOTE — Progress Notes (Signed)
Pulmonary Individual Treatment Plan  Patient Details  Name: Joe Torres MRN: [id number] Date of Birth: 03-Jan-1946 Referring Provider:     Pulmonary Rehab from 08/19/2017 in [facility] Card[facility] and Pulmonary Rehab  Referring Provider  Jeanne Ivan MD      Initial Encounter Date:    Pulmonary Rehab from 08/19/2017 in [facility] Cardiac and Pulmonary Rehab  Date  08/19/17  Referring Provider  Jeanne Ivan MD      Visit Diagnosis: COPD with acute exacerbation [facility])  Patient's Home Medications on Admission: No current outpatient medications on file.  Past Medical History: No past medical history on file.  Tobacco Use: Social History   Tobacco Use  Smoking Status Former Smoker  . Packs/day: 1.50  . Years: 40.00  . Pack years: 60.00  . Types: Cigarettes  . Last attempt to quit: 05/21/2008  . Years since quitting: 9.4  Smokeless Tobacco Never Used    Labs: Recent Review Flowsheet Data    There is no flowsheet data to display.       Pulmonary Assessment Scores: Pulmonary Assessment Scores    Row Name 08/19/17 1132         ADL UCSD   ADL Phase  Entry     SOB Score total  92     Rest  2     Walk  3     Stairs  5     Bath  4     Dress  4     Shop  4       CAT Score   CAT Score  27       mMRC Score   mMRC Score  4        Pulmonary Function Assessment: Pulmonary Function Assessment - 08/19/17 1133      Breath   Bilateral Breath Sounds  Wheezes    Shortness of Breath  Yes;Limiting activity;Panic with Shortness of Breath       Exercise Target Goals:    Exercise Program Goal: Individual exercise prescription set using results from initial 6 min walk test and THRR while considering  patient's activity barriers and safety.    Exercise Prescription Goal: Initial exercise prescription builds to 30-45 minutes a day of aerobic activity, 2-3 days per week.  Home exercise guidelines will be given to patient during program as part of exercise prescription that the  participant will acknowledge.  Activity Barriers & Risk Stratification: Activity Barriers & Cardiac Risk Stratification - 08/19/17 1248      Activity Barriers & Cardiac Risk Stratification   Activity Barriers  Muscular Weakness;Deconditioning;Shortness of Breath;Joint Problems occasional L knee pain       6 Minute Walk: 6 Minute Walk    Row Name 08/19/17 1242         6 Minute Walk   Phase  Initial     Distance  810 feet     Walk Time  4.83 minutes     # of Rest Breaks  6 14 sec, 8 sec, 9 sec, 11 sec, 21 sec, 7 sec     MPH  1.91     METS  2.58     RPE  15     Perceived Dyspnea   4     VO2 Peak  9.02     Symptoms  Yes (comment)     Comments  SOB, hot in hallway at turn around     Resting HR  90 bpm     Resting BP  146/64     Resting Oxygen Saturation   94 %     Exercise Oxygen Saturation  during 6 min walk  87 %     Max Ex. HR  117 bpm     Max Ex. BP  164/74     2 Minute Post BP  156/62       Interval HR   1 Minute HR  107     2 Minute HR  109     3 Minute HR  114     4 Minute HR  115     5 Minute HR  116     6 Minute HR  117     2 Minute Post HR  108     Interval Heart Rate?  Yes       Interval Oxygen   Interval Oxygen?  Yes     Baseline Oxygen Saturation %  94 %     1 Minute Oxygen Saturation %  91 % rest 1:51-2:05     1 Minute Liters of Oxygen  0 L Room Air     2 Minute Oxygen Saturation %  88 % rest 2:36-2:44     2 Minute Liters of Oxygen  0 L     3 Minute Oxygen Saturation %  87 % 3:12-3:21, 3:49-4:00     3 Minute Liters of Oxygen  0 L     4 Minute Oxygen Saturation %  88 % rest 4:39-5:00     4 Minute Liters of Oxygen  0 L     5 Minute Oxygen Saturation %  88 % rest 5:23-5:30     5 Minute Liters of Oxygen  0 L     6 Minute Oxygen Saturation %  89 %     6 Minute Liters of Oxygen  0 L     2 Minute Post Oxygen Saturation %  93 %     2 Minute Post Liters of Oxygen  0 L       Oxygen Initial Assessment: Oxygen Initial Assessment - 08/19/17 1139       Home Oxygen   Home Oxygen Device  Home Concentrator;E-Tanks    Sleep Oxygen Prescription  CPAP    Liters per minute  4    Home Exercise Oxygen Prescription  Continuous    Liters per minute  3    Home at Rest Exercise Oxygen Prescription  Continuous    Liters per minute  3    Compliance with Home Oxygen Use  Yes      Initial 6 min Walk   Oxygen Used  None      Program Oxygen Prescription   Program Oxygen Prescription  None      Intervention   Short Term Goals  To learn and exhibit compliance with exercise, home and travel O2 prescription;To learn and understand importance of maintaining oxygen saturations>88%;To learn and demonstrate proper use of respiratory medications;To learn and demonstrate proper pursed lip breathing techniques or other breathing techniques.;To learn and understand importance of monitoring SPO2 with pulse oximeter and demonstrate accurate use of the pulse oximeter. Nebulizers albuterol BID. Takes 3 medications BID    Long  Term Goals  Exhibits compliance with exercise, home and travel O2 prescription;Verbalizes importance of monitoring SPO2 with pulse oximeter and return demonstration;Maintenance of O2 saturations>88%;Exhibits proper breathing techniques, such as pursed lip breathing or other method taught during program session;Compliance with respiratory medication;Demonstrates proper use of MDI's  Oxygen Re-Evaluation: Oxygen Re-Evaluation    Row Name 08/25/17 1029             Program Oxygen Prescription   Program Oxygen Prescription  None         Home Oxygen   Home Oxygen Device  Home Concentrator;E-Tanks       Sleep Oxygen Prescription  CPAP       Liters per minute  4       Home Exercise Oxygen Prescription  Continuous       Liters per minute  3       Home at Rest Exercise Oxygen Prescription  Continuous       Liters per minute  3       Compliance with Home Oxygen Use  Yes         Goals/Expected Outcomes   Short Term Goals  To learn and  exhibit compliance with exercise, home and travel O2 prescription;To learn and understand importance of maintaining oxygen saturations>88%;To learn and demonstrate proper use of respiratory medications;To learn and demonstrate proper pursed lip breathing techniques or other breathing techniques.;To learn and understand importance of monitoring SPO2 with pulse oximeter and demonstrate accurate use of the pulse oximeter.       Long  Term Goals  Exhibits compliance with exercise, home and travel O2 prescription;Verbalizes importance of monitoring SPO2 with pulse oximeter and return demonstration;Maintenance of O2 saturations>88%;Exhibits proper breathing techniques, such as pursed lip breathing or other method taught during program session;Compliance with respiratory medication;Demonstrates proper use of MDI's       Comments  Reviewed PLB technique with pt.  Talked about how it work and it's important to maintaining his exercise saturations.         Goals/Expected Outcomes  Short: Become more profiecient at using PLB.   Long: Become independent at using PLB.          Oxygen Discharge (Final Oxygen Re-Evaluation): Oxygen Re-Evaluation - 08/25/17 1029      Program Oxygen Prescription   Program Oxygen Prescription  None      Home Oxygen   Home Oxygen Device  Home Concentrator;E-Tanks    Sleep Oxygen Prescription  CPAP    Liters per minute  4    Home Exercise Oxygen Prescription  Continuous    Liters per minute  3    Home at Rest Exercise Oxygen Prescription  Continuous    Liters per minute  3    Compliance with Home Oxygen Use  Yes      Goals/Expected Outcomes   Short Term Goals  To learn and exhibit compliance with exercise, home and travel O2 prescription;To learn and understand importance of maintaining oxygen saturations>88%;To learn and demonstrate proper use of respiratory medications;To learn and demonstrate proper pursed lip breathing techniques or other breathing techniques.;To learn  and understand importance of monitoring SPO2 with pulse oximeter and demonstrate accurate use of the pulse oximeter.    Long  Term Goals  Exhibits compliance with exercise, home and travel O2 prescription;Verbalizes importance of monitoring SPO2 with pulse oximeter and return demonstration;Maintenance of O2 saturations>88%;Exhibits proper breathing techniques, such as pursed lip breathing or other method taught during program session;Compliance with respiratory medication;Demonstrates proper use of MDI's    Comments  Reviewed PLB technique with pt.  Talked about how it work and it's important to maintaining his exercise saturations.      Goals/Expected Outcomes  Short: Become more profiecient at using PLB.   Long: Become independent at using PLB.  Initial Exercise Prescription: Initial Exercise Prescription - 08/19/17 1200      Date of Initial Exercise RX and Referring Provider   Date  08/19/17    Referring Provider  Jeanne Ivan MD      Treadmill   MPH  1.7    Grade  0.5    Minutes  15    METs  2.42      Recumbant Elliptical   Level  1    RPM  50    Minutes  15    METs  2.5      T5 Nustep   Level  1    SPM  80    Minutes  15    METs  2.5      Prescription Details   Frequency (times per week)  3    Duration  Progress to 45 minutes of aerobic exercise without signs/symptoms of physical distress      Intensity   THRR 40-80% of Max Heartrate  114-139    Ratings of Perceived Exertion  11-13    Perceived Dyspnea  0-4      Progression   Progression  Continue to progress workloads to maintain intensity without signs/symptoms of physical distress.      Resistance Training   Training Prescription  Yes    Weight  4lbs    Reps  10-15       Perform Capillary Blood Glucose checks as needed.  Exercise Prescription Changes: Exercise Prescription Changes    Row Name 08/19/17 1200 09/03/17 1100 09/17/17 1400 09/30/17 1300       Response to Exercise   Blood Pressure  (Admit)  146/64  122/80  130/60  140/72    Blood Pressure (Exercise)  164/74  156/74  -  -    Blood Pressure (Exit)  156/62  130/76  154/72  130/84    Heart Rate (Admit)  90 bpm  95 bpm  99 bpm  90 bpm    Heart Rate (Exercise)  117 bpm  115 bpm  119 bpm  107 bpm    Heart Rate (Exit)  108 bpm  107 bpm  104 bpm  91 bpm    Oxygen Saturation (Admit)  94 %  92 %  87 %  90 %    Oxygen Saturation (Exercise)  87 %  91 %  88 %  91 %    Oxygen Saturation (Exit)  93 %  93 %  89 %  90 %    Rating of Perceived Exertion (Exercise)  15  13  15  15     Perceived Dyspnea (Exercise)  4  3  3  3     Symptoms  SOB, hot in hall at turn  none  none  none    Comments  walk test results  -  -  -    Duration  -  Continue with 45 min of aerobic exercise without signs/symptoms of physical distress.  Continue with 45 min of aerobic exercise without signs/symptoms of physical distress.  Continue with 45 min of aerobic exercise without signs/symptoms of physical distress.    Intensity  -  THRR unchanged  THRR unchanged  THRR unchanged      Progression   Progression  -  -  Continue to progress workloads to maintain intensity without signs/symptoms of physical distress.  Continue to progress workloads to maintain intensity without signs/symptoms of physical distress.    Average METs  -  -  1.9  1.9  Resistance Training   Training Prescription  -  Yes  Yes  Yes    Weight  -  4 lb  4 lb  4 lb    Reps  -  10-15  10-15  10-15      Interval Training   Interval Training  -  -  -  No      Treadmill   MPH  -  1  1.3  1.7    Grade  -  0  0  0    Minutes  -  15  15  15     METs  -  1.77  2  2.3      Recumbant Elliptical   Level  -  1  1  1     RPM  -  50  37  -    Minutes  -  15  15  15     METs  -  1.5  1.7  1.5      T5 Nustep   Level  -  2  2  -    SPM  -  80  -  -    Minutes  -  15  15  -    METs  -  2.5  2.9  -       Exercise Comments: Exercise Comments    Row Name 08/25/17 1028           Exercise  Comments  First full day of exercise!  Patient was oriented to gym and equipment including functions, settings, policies, and procedures.  Patient's individual exercise prescription and treatment plan were reviewed.  All starting workloads were established based on the results of the 6 minute walk test done at initial orientation visit.  The plan for exercise progression was also introduced and progression will be customized based on patient's performance and goals.          Exercise Goals and Review: Exercise Goals    Row Name 08/19/17 1251             Exercise Goals   Increase Physical Activity  Yes       Intervention  Provide advice, education, support and counseling about physical activity/exercise needs.;Develop an individualized exercise prescription for aerobic and resistive training based on initial evaluation findings, risk stratification, comorbidities and participant's personal goals.       Expected Outcomes  Short Term: Attend rehab on a regular basis to increase amount of physical activity.;Long Term: Add in home exercise to make exercise part of routine and to increase amount of physical activity.;Long Term: Exercising regularly at least 3-5 days a week.       Increase Strength and Stamina  Yes       Intervention  Provide advice, education, support and counseling about physical activity/exercise needs.;Develop an individualized exercise prescription for aerobic and resistive training based on initial evaluation findings, risk stratification, comorbidities and participant's personal goals.       Expected Outcomes  Short Term: Increase workloads from initial exercise prescription for resistance, speed, and METs.;Short Term: Perform resistance training exercises routinely during rehab and add in resistance training at home;Long Term: Improve cardiorespiratory fitness, muscular endurance and strength as measured by increased METs and functional capacity (6MWT)       Able to understand  and use rate of perceived exertion (RPE) scale  Yes       Intervention  Provide education and explanation on how to use RPE scale  Expected Outcomes  Short Term: Able to use RPE daily in rehab to express subjective intensity level;Long Term:  Able to use RPE to guide intensity level when exercising independently       Able to understand and use Dyspnea scale  Yes       Intervention  Provide education and explanation on how to use Dyspnea scale       Expected Outcomes  Short Term: Able to use Dyspnea scale daily in rehab to express subjective sense of shortness of breath during exertion;Long Term: Able to use Dyspnea scale to guide intensity level when exercising independently       Knowledge and understanding of Target Heart Rate Range (THRR)  Yes       Intervention  Provide education and explanation of THRR including how the numbers were predicted and where they are located for reference       Expected Outcomes  Short Term: Able to state/look up THRR;Short Term: Able to use daily as guideline for intensity in rehab;Long Term: Able to use THRR to govern intensity when exercising independently       Able to check pulse independently  Yes       Intervention  Provide education and demonstration on how to check pulse in carotid and radial arteries.;Review the importance of being able to check your own pulse for safety during independent exercise       Expected Outcomes  Short Term: Able to explain why pulse checking is important during independent exercise;Long Term: Able to check pulse independently and accurately       Understanding of Exercise Prescription  Yes       Intervention  Provide education, explanation, and written materials on patient's individual exercise prescription       Expected Outcomes  Short Term: Able to explain program exercise prescription;Long Term: Able to explain home exercise prescription to exercise independently          Exercise Goals Re-Evaluation : Exercise Goals  Re-Evaluation    Row Name 08/25/17 1028 09/03/17 1133 09/17/17 1445 09/30/17 1330       Exercise Goal Re-Evaluation   Exercise Goals Review  Understanding of Exercise Prescription;Able to understand and use Dyspnea scale;Knowledge and understanding of Target Heart Rate Range (THRR);Able to understand and use rate of perceived exertion (RPE) scale  Increase Physical Activity;Able to understand and use rate of perceived exertion (RPE) scale;Increase Strength and Stamina;Able to understand and use Dyspnea scale  Increase Physical Activity;Able to understand and use rate of perceived exertion (RPE) scale;Increase Strength and Stamina;Able to understand and use Dyspnea scale  Increase Physical Activity;Increase Strength and Stamina;Able to understand and use Dyspnea scale;Able to understand and use rate of perceived exertion (RPE) scale    Comments  Reviewed RPE scale, THR and program prescription with pt today.  Pt voiced understanding and was given a copy of goals to take home.   Abdelaziz is tolerating exercise well.  Staff will continue to monitor.  [place] has been able to work longer without stopping for a break on machines.  He is working on PLB more to control SOB.  Staff will continue to monitor.  Pt is able to work longer on equipment without rest breaks.  MET level is about the same but duration has improved.    Expected Outcomes  Short: Use RPE daily to regulate intensity.  Long: Follow program prescription in THR.  [place] will attend class regularly  [place] will improve overall fitness level  [place] will attend class 2-3 days per week [place] will improve MET level  Short - pt will continue to attend Long - Pt will increase MET level       Discharge Exercise Prescription (Final Exercise Prescription Changes): Exercise Prescription Changes - 09/30/17 1300      Response to Exercise   Blood Pressure (Admit)  140/72    Blood Pressure (Exit)  130/84    Heart Rate (Admit)  90 bpm     Heart Rate (Exercise)  107 bpm    Heart Rate (Exit)  91 bpm    Oxygen Saturation (Admit)  90 %    Oxygen Saturation (Exercise)  91 %    Oxygen Saturation (Exit)  90 %    Rating of Perceived Exertion (Exercise)  15    Perceived Dyspnea (Exercise)  3    Symptoms  none    Duration  Continue with 45 min of aerobic exercise without signs/symptoms of physical distress.    Intensity  THRR unchanged      Progression   Progression  Continue to progress workloads to maintain intensity without signs/symptoms of physical distress.    Average METs  1.9      Resistance Training   Training Prescription  Yes    Weight  4 lb    Reps  10-15      Interval Training   Interval Training  No      Treadmill   MPH  1.7    Grade  0    Minutes  15    METs  2.3      Recumbant Elliptical   Level  1    Minutes  15    METs  1.5       Nutrition:  Target Goals: Understanding of nutrition guidelines, daily intake of sodium <1541m, cholesterol <2029m calories 30% from fat and 7% or less from saturated fats, daily to have 5 or more servings of fruits and vegetables.  Biometrics: Pre Biometrics - 08/19/17 1252      Pre Biometrics   Height  5' 6.1" (1.679 m)    Weight  166 lb (75.3 kg)    Waist Circumference  39 inches    Hip Circumference  38 inches    Waist to Hip Ratio  1.03 %    BMI (Calculated)  26.71        Nutrition Therapy Plan and Nutrition Goals: Nutrition Therapy & Goals - 10/03/17 1201      Nutrition Therapy   RD appointment deferred  Yes       Nutrition Assessments: Nutrition Assessments - 08/19/17 1211      MEDFICTS Scores   Pre Score  50       Nutrition Goals Re-Evaluation:   Nutrition Goals Discharge (Final Nutrition Goals Re-Evaluation):   Psychosocial: Target Goals: Acknowledge presence or absence of significant depression and/or stress, maximize coping skills, provide positive support system. Participant is able to verbalize types and ability to use  techniques and skills needed for reducing stress and depression.   Initial Review & Psychosocial Screening: Initial Psych Review & Screening - 08/19/17 1127      Initial Review   Current issues with  Current Stress Concerns    Source of Stress Concerns  Chronic Illness    Comments  He is not able to do yard work and be outside like he used to.      Family Dynamics   Good Support System?  Yes  Comments  His daughter is a good support system      Barriers   Psychosocial barriers to participate in program  The patient should benefit from training in stress management and relaxation.      Screening Interventions   Interventions  Encouraged to exercise;Program counselor consult;To provide support and resources with identified psychosocial needs;Provide feedback about the scores to participant    Expected Outcomes  Short Term goal: Utilizing psychosocial counselor, staff and physician to assist with identification of specific Stressors or current issues interfering with healing process. Setting desired goal for each stressor or current issue identified.;Long Term Goal: Stressors or current issues are controlled or eliminated.;Short Term goal: Identification and review with participant of any Quality of Life or Depression concerns found by scoring the questionnaire.;Long Term goal: The participant improves quality of Life and PHQ9 Scores as seen by post scores and/or verbalization of changes       Quality of Life Scores:  Scores of 19 and below usually indicate a poorer quality of life in these areas.  A difference of  2-3 points is a clinically meaningful difference.  A difference of 2-3 points in the total score of the Quality of Life Index has been associated with significant improvement in overall quality of life, self-image, physical symptoms, and general health in studies assessing change in quality of life.  PHQ-9: Recent Review Flowsheet Data    Depression screen [facility] 2/9 08/19/2017    Decreased Interest 2   Down, Depressed, Hopeless 1   PHQ - 2 Score 3   Altered sleeping 1   Tired, decreased energy 1   Change in appetite 0   Feeling bad or failure about yourself  0   Trouble concentrating 1   Moving slowly or fidgety/restless 0   Suicidal thoughts 0   PHQ-9 Score 6   Difficult doing work/chores Somewhat difficult     Interpretation of Total Score  Total Score Depression Severity:  1-4 = Minimal depression, 5-9 = Mild depression, 10-14 = Moderate depression, 15-19 = Moderately severe depression, 20-27 = Severe depression   Psychosocial Evaluation and Intervention: Psychosocial Evaluation - 08/27/17 1214      Psychosocial Evaluation & Interventions   Interventions  Encouraged to exercise with the program and follow exercise prescription    Comments  Counselor met with Mr. [facility]) along with his Spanish interpreter today for the initial psychosocial evaluation.  He is a 72 year old who struggles with COPD.  Akoni has a strong support system with a spouse; daughter and grandchildren who live locally.  He considers himself in good health mostly; and he sleeps well and has a "super" appetite.  Ilias denies a history of depression or anxiety and states he is typically in a positive mood.  He reports some stress with his health condition and not being able to do the activities he used to enjoy.  [place] has goals for this program to feel better and breathe better.      Expected Outcomes  Short:  Willi will exercise for stress and health benefits.   Long:  [place] will continue to exercise consistently to improve his quality of life.      Continue Psychosocial Services   Follow up required by staff       Psychosocial Re-Evaluation: Psychosocial Re-Evaluation    [place] Name 10/03/17 1204             Psychosocial Re-Evaluation   Current issues with  Current Stress Concerns  Comments  no change in stress       Interventions  Encouraged to attend Pulmonary Rehabilitation  for the exercise       Continue Psychosocial Services   Follow up required by staff          Psychosocial Discharge (Final Psychosocial Re-Evaluation): Psychosocial Re-Evaluation - 10/03/17 1204      Psychosocial Re-Evaluation   Current issues with  Current Stress Concerns    Comments  no change in stress    Interventions  Encouraged to attend Pulmonary Rehabilitation for the exercise    Continue Psychosocial Services   Follow up required by staff       Education: Education Goals: Education classes will be provided on a weekly basis, covering required topics. Participant will state understanding/return demonstration of topics presented.  Learning Barriers/Preferences: Learning Barriers/Preferences - 08/19/17 1136      Learning Barriers/Preferences   Learning Barriers  Sight;Language;Exercise Concerns wears glasses    Learning Preferences  Individual Instruction       Education Topics:  Initial Evaluation Education: - Verbal, written and demonstration of respiratory meds, oximetry and breathing techniques. Instruction on use of nebulizers and MDIs and importance of monitoring MDI activations.   Pulmonary Rehab from 10/03/2017 in [facility] Cardiac and Pulmonary Rehab  Date  08/19/17  Educator  [facility]  Instruction Review Code  1- Verbalizes Understanding      General Nutrition Guidelines/Fats and Fiber: -Group instruction provided by verbal, written material, models and posters to present the general guidelines for heart healthy nutrition. Gives an explanation and review of dietary fats and fiber.   Pulmonary Rehab from 10/03/2017 in [facility] Cardiac and Pulmonary Rehab  Date  09/29/17  Educator  [facility]  Instruction Review Code  1- Verbalizes Understanding      Controlling Sodium/Reading Food Labels: -Group verbal and written material supporting the discussion of sodium use in heart healthy nutrition. Review and explanation with models, verbal and written materials for utilization of the  food label.   Exercise Physiology & General Exercise Guidelines: - Group verbal and written instruction with models to review the exercise physiology of the cardiovascular system and associated critical values. Provides general exercise guidelines with specific guidelines to those with heart or lung disease.    Aerobic Exercise & Resistance Training: - Gives group verbal and written instruction on the various components of exercise. Focuses on aerobic and resistive training programs and the benefits of this training and how to safely progress through these programs.   Pulmonary Rehab from 10/03/2017 in [facility] Cardiac and Pulmonary Rehab  Date  09/05/17  Educator  [facility]  Instruction Review Code  1- Verbalizes Understanding      Flexibility, Balance, Mind/Body Relaxation: Provides group verbal/written instruction on the benefits of flexibility and balance training, including mind/body exercise modes such as yoga, pilates and tai chi.  Demonstration and skill practice provided.   Stress and Anxiety: - Provides group verbal and written instruction about the health risks of elevated stress and causes of high stress.  Discuss the correlation between heart/lung disease and anxiety and treatment options. Review healthy ways to manage with stress and anxiety.   Depression: - Provides group verbal and written instruction on the correlation between heart/lung disease and depressed mood, treatment options, and the stigmas associated with seeking treatment.   Pulmonary Rehab from 10/03/2017 in [facility] Cardiac and Pulmonary Rehab  Date  09/24/17  Educator  [facility]  Instruction Review Code  1- Verbalizes Understanding      Exercise &  Equipment Safety: - Individual verbal instruction and demonstration of equipment use and safety with use of the equipment.   Pulmonary Rehab from 10/03/2017 in [facility] Cardiac and Pulmonary Rehab  Date  08/19/17  Educator  [facility]  Instruction Review Code  1- Verbalizes Understanding       Infection Prevention: - Provides verbal and written material to individual with discussion of infection control including proper hand washing and proper equipment cleaning during exercise session.   Pulmonary Rehab from 10/03/2017 in [facility] Cardiac and Pulmonary Rehab  Date  08/19/17  Educator  [facility]  Instruction Review Code  1- Verbalizes Understanding      Falls Prevention: - Provides verbal and written material to individual with discussion of falls prevention and safety.   Pulmonary Rehab from 10/03/2017 in [facility] Cardiac and Pulmonary Rehab  Date  08/19/17  Educator  [facility]  Instruction Review Code  1- Verbalizes Understanding      Diabetes: - Individual verbal and written instruction to review signs/symptoms of diabetes, desired ranges of glucose level fasting, after meals and with exercise. Advice that pre and post exercise glucose checks will be done for 3 sessions at entry of program.   Chronic Lung Diseases: - Group verbal and written instruction to review updates, respiratory medications, advancements in procedures and treatments. Discuss use of supplemental oxygen including available portable oxygen systems, continuous and intermittent flow rates, concentrators, personal use and safety guidelines. Review proper use of inhaler and spacers. Provide informative websites for self-education.    Energy Conservation: - Provide group verbal and written instruction for methods to conserve energy, plan and organize activities. Instruct on pacing techniques, use of adaptive equipment and posture/positioning to relieve shortness of breath.   Pulmonary Rehab from 10/03/2017 in [facility] Cardiac and Pulmonary Rehab  Date  10/01/17  Educator  [facility]  Instruction Review Code  1- Verbalizes Understanding      Triggers and Exacerbations: - Group verbal and written instruction to review types of environmental triggers and ways to prevent exacerbations. Discuss weather changes, air quality and the  benefits of nasal washing. Review warning signs and symptoms to help prevent infections. Discuss techniques for effective airway clearance, coughing, and vibrations.   AED/CPR: - Group verbal and written instruction with the use of models to demonstrate the basic use of the AED with the basic ABC's of resuscitation.   Pulmonary Rehab from 10/03/2017 in [facility] Cardiac and Pulmonary Rehab  Date  10/03/17  Educator  [place]  Instruction Review Code  1- Verbalizes Understanding      Anatomy and Physiology of the Lungs: - Group verbal and written instruction with the use of models to provide basic lung anatomy and physiology related to function, structure and complications of lung disease.   Anatomy & Physiology of the Heart: - Group verbal and written instruction and models provide basic cardiac anatomy and physiology, with the coronary electrical and arterial systems. Review of Valvular disease and Heart Failure   Pulmonary Rehab from 10/03/2017 in [facility] Cardiac and Pulmonary Rehab  Date  09/10/17  Educator  [facility]  Instruction Review Code  1- Verbalizes Understanding      Cardiac Medications: - Group verbal and written instruction to review commonly prescribed medications for heart disease. Reviews the medication, class of the drug, and side effects.   Pulmonary Rehab from 10/03/2017 in [facility] Cardiac and Pulmonary Rehab  Date  09/19/17  Educator  [facility]  Instruction Review Code  1- Verbalizes Understanding      Know Your Numbers  and Risk Factors: -Group verbal and written instruction about important numbers in your health.  Discussion of what are risk factors and how they play a role in the disease process.  Review of Cholesterol, Blood Pressure, Diabetes, and BMI and the role they play in your overall health.   Sleep Hygiene: -Provides group verbal and written instruction about how sleep can affect your health.  Define sleep hygiene, discuss sleep cycles and impact of sleep habits. Review good  sleep hygiene tips.    Pulmonary Rehab from 10/03/2017 in [facility] Cardiac and Pulmonary Rehab  Date  08/27/17  Educator  [facility]  Instruction Review Code  1- Verbalizes Understanding      Other: -Provides group and verbal instruction on various topics (see comments)    Knowledge Questionnaire Score: Knowledge Questionnaire Score - 08/19/17 1135      Knowledge Questionnaire Score   Pre Score  16/18 reviewed with patient        Core Components/Risk Factors/Patient Goals at Admission: Personal Goals and Risk Factors at Admission - 08/19/17 1144      Core Components/Risk Factors/Patient Goals on Admission    Weight Management  Yes;Weight Maintenance;Weight Loss    Intervention  Weight Management: Develop a combined nutrition and exercise program designed to reach desired caloric intake, while maintaining appropriate intake of nutrient and fiber, sodium and fats, and appropriate energy expenditure required for the weight goal.;Weight Management: Provide education and appropriate resources to help participant work on and attain dietary goals.;Weight Management/Obesity: Establish reasonable short term and long term weight goals.    Admit Weight  166 lb (75.3 kg)    Goal Weight: Short Term  161 lb (73 kg)    Goal Weight: Long Term  155 lb (70.3 kg)    Expected Outcomes  Short Term: Continue to assess and modify interventions until short term weight is achieved;Long Term: Adherence to nutrition and physical activity/exercise program aimed toward attainment of established weight goal;Weight Maintenance: Understanding of the daily nutrition guidelines, which includes 25-35% calories from fat, 7% or less cal from saturated fats, less than 278m cholesterol, less than 1.5gm of sodium, & 5 or more servings of fruits and vegetables daily;Understanding recommendations for meals to include 15-35% energy as protein, 25-35% energy from fat, 35-60% energy from carbohydrates, less than 2058mof dietary cholesterol,  20-35 gm of total fiber daily;Understanding of distribution of calorie intake throughout the day with the consumption of 4-5 meals/snacks;Weight Loss: Understanding of general recommendations for a balanced deficit meal plan, which promotes 1-2 lb weight loss per week and includes a negative energy balance of [phone] kcal/d    Improve shortness of breath with ADL's  Yes    Intervention  Provide education, individualized exercise plan and daily activity instruction to help decrease symptoms of SOB with activities of daily living.    Expected Outcomes  Short Term: Improve cardiorespiratory fitness to achieve a reduction of symptoms when performing ADLs;Long Term: Be able to perform more ADLs without symptoms or delay the onset of symptoms    Hypertension  Yes    Intervention  Provide education on lifestyle modifcations including regular physical activity/exercise, weight management, moderate sodium restriction and increased consumption of fresh fruit, vegetables, and low fat dairy, alcohol moderation, and smoking cessation.;Monitor prescription use compliance. takes medication for blood pressure    Expected Outcomes  Short Term: Continued assessment and intervention until BP is < 140/9036mG in hypertensive participants. < 130/29m67m in hypertensive participants with diabetes, heart failure or chronic kidney  disease.;Long Term: Maintenance of blood pressure at goal levels. at home 140-145/80's.    Lipids  Yes    Intervention  Provide education and support for participant on nutrition & aerobic/resistive exercise along with prescribed medications to achieve LDL <49m, HDL >474m    Expected Outcomes  Short Term: Participant states understanding of desired cholesterol values and is compliant with medications prescribed. Participant is following exercise prescription and nutrition guidelines.;Long Term: Cholesterol controlled with medications as prescribed, with individualized exercise RX and with personalized  nutrition plan. Value goals: LDL < 7033mHDL > 40 mg.       Core Components/Risk Factors/Patient Goals Review:  Goals and Risk Factor Review    Row Name 10/03/17 1201             Core Components/Risk Factors/Patient Goals Review   Personal Goals Review  Weight Management/Obesity;Improve shortness of breath with ADL's;Develop more efficient breathing techniques such as purse lipped breathing and diaphragmatic breathing and practicing self-pacing with activity.;Stress       Review  Pt states he can tell he is breathing better and able to walk further.  He has made small steps of progress and wants to continue to get back to his normal.  He doesn't have any changes in stress levels.       Expected Outcomes  Short - Pt will attend class regularly Long - Pt will improve oevrall MET level further          Core Components/Risk Factors/Patient Goals at Discharge (Final Review):  Goals and Risk Factor Review - 10/03/17 1201      Core Components/Risk Factors/Patient Goals Review   Personal Goals Review  Weight Management/Obesity;Improve shortness of breath with ADL's;Develop more efficient breathing techniques such as purse lipped breathing and diaphragmatic breathing and practicing self-pacing with activity.;Stress    Review  Pt states he can tell he is breathing better and able to walk further.  He has made small steps of progress and wants to continue to get back to his normal.  He doesn't have any changes in stress levels.    Expected Outcomes  Short - Pt will attend class regularly Long - Pt will improve oevrall MET level further       ITP Comments: ITP Comments    Row Name 08/19/17 1046 09/15/17 0827 10/13/17 0820       ITP Comments  Medical Evaluation completed. Chart sent for review and changes to Dr. MarEmily Filbertrector of [facility] can be found in [facility] encounter 08/01/17   30 day review completed. ITP sent to Dr. MarEmily Filbertrector of [facility] with ITP unless  changes are made by physician   30 day review completed. ITP sent to Dr. MarEmily Filbertrector of [facility] with ITP unless changes are made by physician        Comments: 30 day review

## 2017-10-13 NOTE — Progress Notes (Signed)
Daily Session Note  Patient Details  Name: Joe Torres MRN: [id number] Date of Birth: [birth year] Referring Provider:     Pulmonary Rehab from 08/19/2017 in [facility] Cardiac and Pulmonary Rehab  Referring Provider  Jeanne Ivan MD      Encounter Date: 10/13/2017  Check In: Session Check In - 10/13/17 1126      Check-In   Location  [facility]-Cardiac & Pulmonary Rehab    Staff Present  Joellyn Rued, BS, [place], [place], ACSM CEP, Exercise Physiologist;Isaias Dowson Flavia Shipper    Supervising physician immediately available to respond to emergencies  [facility] immediately available ER MD    Physician(s)  Dr. Corky Downs and Cinda Quest    Medication changes reported      No    Fall or balance concerns reported     No    Tobacco Cessation  No Change    Warm-up and Cool-down  Performed as group-led instruction    Resistance Training Performed  Yes    VAD Patient?  No      Pain Assessment   Currently in Pain?  No/denies          Social History   Tobacco Use  Smoking Status Former Smoker  . Packs/day: 1.50  . Years: 40.00  . Pack years: 60.00  . Types: Cigarettes  . Last attempt to quit: 05/21/2008  . Years since quitting: 9.4  Smokeless Tobacco Never Used    Goals Met:  Independence with exercise equipment Exercise tolerated well No report of cardiac concerns or symptoms Strength training completed today  Goals Unmet:  Not Applicable  Comments: Pt able to follow exercise prescription today without complaint.  Will continue to monitor for progression.   Dr. Emily Filbert is Medical Director for [place] and [facility].

## 2017-10-15 DIAGNOSIS — J441 Chronic obstructive pulmonary disease with (acute) exacerbation: Secondary | ICD-10-CM

## 2017-10-15 NOTE — Progress Notes (Signed)
Daily Session Note  Patient Details  Name: Joe Torres MRN: [id number] Date of Birth: 24-Mar-1946 Referring Provider:     Pulmonary Rehab from 08/19/2017 in [facility] Cardiac and Pulmonary Rehab  Referring Provider  Jeanne Ivan MD      Encounter Date: 10/15/2017  Check In: Session Check In - 10/15/17 1005      Check-In   Location  [facility]-Cardiac & Pulmonary Rehab    Staff Present  Justin Mend RCP,RRT,BSRT;Krista Frederico Hamman, RN Vickki Hearing, BA, ACSM CEP, Exercise Physiologist    Supervising physician immediately available to respond to emergencies  [facility] immediately available ER MD    Physician(s)  Dr. Quentin Cornwall and Jimmye Norman    Medication changes reported      No    Fall or balance concerns reported     No    Tobacco Cessation  No Change    Warm-up and Cool-down  Performed as group-led instruction    Resistance Training Performed  Yes    VAD Patient?  No      Pain Assessment   Currently in Pain?  No/denies          Social History   Tobacco Use  Smoking Status Former Smoker  . Packs/day: 1.50  . Years: 40.00  . Pack years: 60.00  . Types: Cigarettes  . Last attempt to quit: 05/21/2008  . Years since quitting: 9.4  Smokeless Tobacco Never Used    Goals Met:  Independence with exercise equipment Exercise tolerated well No report of cardiac concerns or symptoms Strength training completed today  Goals Unmet:  Not Applicable  Comments: Pt able to follow exercise prescription today without complaint.  Will continue to monitor for progression.   Dr. Emily Filbert is Medical Director for [place] and [facility].

## 2017-10-17 DIAGNOSIS — J441 Chronic obstructive pulmonary disease with (acute) exacerbation: Secondary | ICD-10-CM

## 2017-10-17 NOTE — Progress Notes (Signed)
Daily Session Note  Patient Details  Name: Joe Torres MRN: [id number] Date of Birth: 04-30-1946 Referring Provider:     Pulmonary Rehab from 08/19/2017 in [facility] Cardiac and Pulmonary Rehab  Referring Provider  Jeanne Ivan MD      Encounter Date: 10/17/2017  Check In: Session Check In - 10/17/17 0940      Check-In   Location  [facility]-Cardiac & Pulmonary Rehab    Staff Present  Justin Mend RCP,RRT,BSRT;Laureen Owens Shark, BS, RRT, Respiratory Therapist;Mandi Zachery Conch, BS, [facility]    Supervising physician immediately available to respond to emergencies  [facility] immediately available ER MD    Physician(s)  Dr. Cinda Quest and Clearnce Hasten    Medication changes reported      No    Fall or balance concerns reported     No    Tobacco Cessation  No Change    Warm-up and Cool-down  Performed as group-led instruction    Resistance Training Performed  Yes    VAD Patient?  No      Pain Assessment   Currently in Pain?  No/denies          Social History   Tobacco Use  Smoking Status Former Smoker  . Packs/day: 1.50  . Years: 40.00  . Pack years: 60.00  . Types: Cigarettes  . Last attempt to quit: 05/21/2008  . Years since quitting: 9.4  Smokeless Tobacco Never Used    Goals Met:  Independence with exercise equipment Exercise tolerated well No report of cardiac concerns or symptoms Strength training completed today  Goals Unmet:  Not Applicable  Comments: Pt able to follow exercise prescription today without complaint.  Will continue to monitor for progression.   Dr. Emily Filbert is Medical Director for [place] and [facility].

## 2017-10-22 DIAGNOSIS — J441 Chronic obstructive pulmonary disease with (acute) exacerbation: Secondary | ICD-10-CM | POA: Diagnosis not present

## 2017-10-22 NOTE — Progress Notes (Signed)
Daily Session Note  Patient Details  Name: Joe Torres MRN: [id number] Date of Birth: [birth year] Referring Provider:     Pulmonary Rehab from 08/19/2017 in [facility] Cardiac and Pulmonary Rehab  Referring Provider  Jeanne Ivan MD      Encounter Date: 10/22/2017  Check In: Session Check In - 10/22/17 0950      Check-In   Location  [facility]-Cardiac & Pulmonary Rehab    Staff Present  Justin Mend Lorre Nick, MA, RCEP, CCRP, Exercise Physiologist;Amanda Oletta Darter, [place], ACSM CEP, Exercise Physiologist    Supervising physician immediately available to respond to emergencies  [facility] immediately available ER MD    Physician(s)  Dr. Kerman Passey and Joni Fears    Medication changes reported      No    Fall or balance concerns reported     No    Tobacco Cessation  No Change    Warm-up and Cool-down  Performed as group-led instruction    Resistance Training Performed  Yes    VAD Patient?  No      Pain Assessment   Currently in Pain?  No/denies          Social History   Tobacco Use  Smoking Status Former Smoker  . Packs/day: 1.50  . Years: 40.00  . Pack years: 60.00  . Types: Cigarettes  . Last attempt to quit: 05/21/2008  . Years since quitting: 9.4  Smokeless Tobacco Never Used    Goals Met:  Independence with exercise equipment Exercise tolerated well No report of cardiac concerns or symptoms Strength training completed today  Goals Unmet:  Not Applicable  Comments: Pt able to follow exercise prescription today without complaint.  Will continue to monitor for progression.   Dr. Emily Filbert is Medical Director for [place] and [facility].

## 2017-10-27 DIAGNOSIS — J441 Chronic obstructive pulmonary disease with (acute) exacerbation: Secondary | ICD-10-CM

## 2017-10-27 NOTE — Progress Notes (Signed)
Daily Session Note  Patient Details  Name: Joe Torres MRN: [id number] Date of Birth: July 23, 1945 Referring Provider:     Pulmonary Rehab from 08/19/2017 in [facility] Cardiac and Pulmonary Rehab  Referring Provider  Jeanne Ivan MD      Encounter Date: 10/27/2017  Check In: Session Check In - 10/27/17 1012      Check-In   Location  [facility]-Cardiac & Pulmonary Rehab    Staff Present  Justin Mend RCP,RRT,BSRT;Amanda Oletta Darter, BA, ACSM CEP, Exercise Physiologist;Kelly Amedeo Plenty, BS, ACSM CEP, Exercise Physiologist    Supervising physician immediately available to respond to emergencies  [facility] immediately available ER MD    Physician(s)  Dr. Corky Downs and Clearnce Hasten    Medication changes reported      No    Fall or balance concerns reported     No    Tobacco Cessation  No Change    Warm-up and Cool-down  Performed as group-led instruction    Resistance Training Performed  Yes    VAD Patient?  No      Pain Assessment   Currently in Pain?  No/denies          Social History   Tobacco Use  Smoking Status Former Smoker  . Packs/day: 1.50  . Years: 40.00  . Pack years: 60.00  . Types: Cigarettes  . Last attempt to quit: 05/21/2008  . Years since quitting: 9.4  Smokeless Tobacco Never Used    Goals Met:  Independence with exercise equipment Exercise tolerated well No report of cardiac concerns or symptoms Strength training completed today  Goals Unmet:  Not Applicable  Comments: Pt able to follow exercise prescription today without complaint.  Will continue to monitor for progression.   Dr. Emily Filbert is Medical Director for [place] and [facility].

## 2017-10-28 ENCOUNTER — Ambulatory Visit: Payer: Medicare HMO | Attending: Internal Medicine | Admitting: Physical Therapy

## 2017-10-28 ENCOUNTER — Encounter: Payer: Self-pay | Admitting: Physical Therapy

## 2017-10-28 ENCOUNTER — Other Ambulatory Visit: Payer: Self-pay

## 2017-10-28 DIAGNOSIS — M542 Cervicalgia: Secondary | ICD-10-CM | POA: Diagnosis present

## 2017-10-28 DIAGNOSIS — M62838 Other muscle spasm: Secondary | ICD-10-CM | POA: Insufficient documentation

## 2017-10-29 DIAGNOSIS — J441 Chronic obstructive pulmonary disease with (acute) exacerbation: Secondary | ICD-10-CM | POA: Diagnosis not present

## 2017-10-29 NOTE — Progress Notes (Signed)
Daily Session Note  Patient Details  Name: Joe Torres MRN: [id number] Date of Birth: 10/24/1945 Referring Provider:     Pulmonary Rehab from 08/19/2017 in [facility] Cardiac and Pulmonary Rehab  Referring Provider  Chang, Lydia MD      Encounter Date: 10/29/2017  Check In: Session Check In - 10/29/17 0951      Check-In   Location  [facility]-Cardiac & Pulmonary Rehab    Staff Present  Joseph Hood RCP,RRT,BSRT;Jessica Hawkins, MA, RCEP, CCRP, Exercise Physiologist;Amanda Sommer, BA, ACSM CEP, Exercise Physiologist    Supervising physician immediately available to respond to emergencies  [facility] immediately available ER MD    Physician(s)  Dr. Williams and Kinner    Medication changes reported      No    Fall or balance concerns reported     No    Tobacco Cessation  No Change    Warm-up and Cool-down  Performed as group-led instruction    Resistance Training Performed  Yes    VAD Patient?  No      Pain Assessment   Currently in Pain?  No/denies          Social History   Tobacco Use  Smoking Status Former Smoker  . Packs/day: 1.50  . Years: 40.00  . Pack years: 60.00  . Types: Cigarettes  . Last attempt to quit: 05/21/2008  . Years since quitting: 9.4  Smokeless Tobacco Never Used    Goals Met:  Independence with exercise equipment Exercise tolerated well No report of cardiac concerns or symptoms Strength training completed today  Goals Unmet:  Not Applicable  Comments: Pt able to follow exercise prescription today without complaint.  Will continue to monitor for progression.   Dr. Mark Miller is Medical Director for [facility] and [facility]. 

## 2017-10-29 NOTE — Therapy (Signed)
[facility] [address] [place], [address] Phone: [phone]   Fax:  [phone]  Physical Therapy Evaluation  Patient Details  Name: Joe Torres MRN: [id number] Date of Birth: 08/04/1945 Referring Provider: Kingsley Spittle MD   Encounter Date: 10/28/2017  PT End of Session - 10/28/17 1855    Visit Number  1    Number of Visits  13    Date for PT Re-Evaluation  12/10/17    PT Start Time  1518    PT Stop Time  1620    PT Time Calculation (min)  62 min    Activity Tolerance  Patient limited by pain;Patient tolerated treatment well    Behavior During Therapy  [facility] for tasks assessed/performed       History reviewed. No pertinent past medical history.  History reviewed. No pertinent surgical history.  There were no vitals filed for this visit.   Subjective Assessment - 10/28/17 1520    Subjective  patient reports he has neck pain as primary concern and he is having tingling and numbness in his head on the left side over the past week or so. He reports that his physician is aware of this sympotm.     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 Pain is worsening at this time and is radiating to the left shoulder, none in right shoulder.     Limitations  Lifting;Sitting;House hold activities;Other (comment) turning head right/left    Patient Stated Goals  decrease pain in neck so he can return to PLOF    Currently in Pain?  Yes best 6/10; worst 10/10    Pain Score  6     Pain Location  Neck    Pain Orientation  Right;Left;Posterior    Pain Descriptors / Indicators  Throbbing;Spasm;Discomfort    Pain Type  Acute pain    Pain Radiating Towards  right and left shoulder    Pain Onset  More  than a month ago    Pain Frequency  Constant    Aggravating Factors   rotation of head to right /left     Pain Relieving Factors  medication, heat    Effect of Pain on Daily Activities  Patient reports he does all activity he has to with pain          [facility] PT Assessment - 10/28/17 1534      Assessment   Medical Diagnosis  left sided neck pain    Referring Provider  Kingsley Spittle MD    Onset Date/Surgical Date  09/10/17    Hand Dominance  Right    Prior Therapy  none      Balance Screen   Has the patient fallen in the past 6 months  No      Prior Function   Level of Independence  Independent    Vocation  Retired    [employer]  retired Games developer, yard work    Leisure  watch TV,       Cognition   Overall Cognitive Status  Within Functional Limits for tasks assessed      ROM / Strength   AROM / PROM / Strength  AROM;Strength  AROM: cervical spine: flexion 40 with pain posteriorly; extension 40 degrees with increased left sided pain, rotation right and left 40 degrees with increased pain left side on return to neutral; side bend right 30; left 30 with pain on opposite side of side bend left>right; shoulders WNL bilateral  Strength: bilateral UE's shoulders strong, equal strength all major muscle groups without reproduction of symptoms grip strength: right 60#; left 70#  Special tests:  Spurlings: negative bilaterally compression and distraction negative for reproduction of symptoms cervical retraction improved with repetition with reported decreased pain /symptoms    Objective measurements completed on examination: See above findings.    Treatment:  Modalities: Electrical stimulation: High volt estim.clincial program for muscle spasms  (4) electrodes applied to bilateral posterior cervical spine paraspinal muscles and upper trapezius muscles over spasms with intensity to tolerance with patient in sitting position with UE's supported goal: pain,  spasms Moist heat applied to both shoulders, cervical spine with estim. For pain, spasms, no adverse reactions noted  Therapeutic Exercise: patient performed with instruction, demonstration, tactile and verbal cues and facilitation of therapist: goal: improved ROM, decrease pain, independent with home program  Sitting: Cervical spine retraction with assist, guided motion; x 5 reps  Scapular retraction x 5 reps   Patient response to treatment: patient demonstrated improved technique with exercises with facilitation, tactile and VC for correct alignment. Patient with decreased pain from   6/10 to  4/10. Patient with decreased spasms by 30% following estim and moist heat. Improved motor control with repetition and cuing, following estim.        PT Education - 10/28/17 1700    Education Details  POC; HEP for posture awareness, cervical retraction, use of lumbar, cervical rolls for support with sitting/sleeping    Person(s) Educated  Patient    Methods  Explanation;Demonstration;Verbal cues;Tactile cues;Handout    Comprehension  Verbalized understanding;Returned demonstration;Verbal cues required;Tactile cues required          PT Long Term Goals - 10/28/17 1851      PT LONG TERM GOAL #1   Title  Patient will report max pain level to 5/10 to allow improved function with daily activities involving neck     Baseline  pain ranges from 7-10/10 and worse with turning head    Status  New    Target Date  11/19/17      PT LONG TERM GOAL #2   Title  Patient will report max pain level to 3/10 to allow improved function with daily activities involving neck     Baseline  pain ranges from 7-10/10 and worse with turning head    Status  New    Target Date  12/10/17      PT LONG TERM GOAL #3   Title  patient will be independent with home program for pain control and exercises for posture to allow self management of symptoms once discharged from physical therapy    Baseline  no knowledge of  appropriate pain control strategies, posture correction or exercise progression without guided instruction and cuing    Status  New    Target Date  12/10/17             Plan - 10/28/17 1848    Clinical Impression Statement  Patient is a 72 year old right hand dominant male who presents with cervical spine pain that began about 6 weeks ago due to reaching with right UE. He has limited ROM cervical spine with spasms and pain that limit function  with daily tasks and is worse with turning head to righ tor left. He has limitd knowledge of appropriate pain control strategies and exercise progression in order to achieve maximal function with daily activities.     History and Personal Factors relevant to plan of care:  Patient reports he began having neck pain about 6 weeks ago when he extended his right arm to reach for a tool in the back of his [place] Lei from the front seat and felt pain in his chest and back of his neck. Pain is worsening at this time and is radiating to the left shoulder, none in right shoulder.     Clinical Presentation  Evolving    Clinical Presentation due to:  initially pain was in neck and right shoulder and is now radiating to left side with numbness tingling     Clinical Decision Making  Moderate    Rehab Potential  Good    Clinical Impairments Affecting Rehab Potential  (-)pain worsening with radating symptoms across back to left side/shoulder; multiple co morbidities    PT Frequency  2x / week    PT Duration  6 weeks    PT Treatment/Interventions  Iontophoresis 4mg /ml Dexamethasone;Cryotherapy;Electrical Stimulation;Ultrasound;Moist Heat;Therapeutic exercise;Therapeutic activities;Neuromuscular re-education;Patient/family education;Manual techniques;Dry needling    PT Next Visit Plan  pain control, electrical stimulation, [place], ther ex    PT Home Exercise Plan  posture awareness, correction, use of lumbar and cervical rolls for positioning sitting and sleeping    Consulted and  Agree with Plan of Care  Patient       Patient will benefit from skilled therapeutic intervention in order to improve the following deficits and impairments:  Pain, Postural dysfunction, Increased muscle spasms, Decreased activity tolerance, Decreased endurance, Decreased range of motion, Impaired perceived functional ability, Impaired UE functional use  Visit Diagnosis: Cervicalgia - Plan: PT plan of care cert/re-cert  Other muscle spasm - Plan: PT plan of care cert/re-cert     Problem List There are no active problems to display for this patient.   Jomarie Longs PT 10/29/2017, 10:03 PM  [facility] [address] [place], [address] Phone: [phone]   Fax:  [phone]  Name: SAURABH HETTICH MRN: [id number] Date of Birth: 1946/03/14

## 2017-10-31 DIAGNOSIS — J441 Chronic obstructive pulmonary disease with (acute) exacerbation: Secondary | ICD-10-CM | POA: Diagnosis not present

## 2017-10-31 NOTE — Progress Notes (Signed)
Daily Session Note  Patient Details  Name: Joe Torres MRN: [id number] Date of Birth: 09-Apr-1946 Referring Provider:     Pulmonary Rehab from 08/19/2017 in [facility]  Referring Provider  Jeanne Ivan MD      Encounter Date: 10/31/2017  Check In: Session Check In - 10/31/17 1019      Check-In   Location  [facility]-Cardiac & Pulmonary Rehab    Staff Present  Justin Mend Lindell Spar, BA, ACSM CEP, Exercise Physiologist;Meredith Sherryll Burger, RN BSN    Supervising physician immediately available to respond to emergencies  [facility] immediately available ER MD    Physician(s)  Dr. Corky Downs and Alfred Levins    Medication changes reported      No    Fall or balance concerns reported     No    Tobacco Cessation  No Change    Warm-up and Cool-down  Performed as group-led instruction    Resistance Training Performed  Yes    VAD Patient?  No      Pain Assessment   Currently in Pain?  No/denies          Social History   Tobacco Use  Smoking Status Former Smoker  . Packs/day: 1.50  . Years: 40.00  . Pack years: 60.00  . Types: Cigarettes  . Last attempt to quit: 05/21/2008  . Years since quitting: 9.4  Smokeless Tobacco Never Used    Goals Met:  Independence with exercise equipment Exercise tolerated well No report of cardiac concerns or symptoms Strength training completed today  Goals Unmet:  Not Applicable  Comments: Pt able to follow exercise prescription today without complaint.  Will continue to monitor for progression.   Dr. Emily Filbert is Medical Director for [place] and [facility].

## 2017-11-03 ENCOUNTER — Encounter: Payer: Medicare HMO | Admitting: Physical Therapy

## 2017-11-04 ENCOUNTER — Encounter: Payer: Self-pay | Admitting: Physical Therapy

## 2017-11-04 ENCOUNTER — Ambulatory Visit: Payer: Medicare HMO | Admitting: Physical Therapy

## 2017-11-04 DIAGNOSIS — M62838 Other muscle spasm: Secondary | ICD-10-CM

## 2017-11-04 DIAGNOSIS — M542 Cervicalgia: Secondary | ICD-10-CM

## 2017-11-04 NOTE — Therapy (Signed)
[facility] [address] [place], [address] Phone: [phone]   Fax:  [phone]  Physical Therapy Treatment  Patient Details  Name: Joe Torres MRN: [id number] Date of Birth: 10-Oct-1945 Referring Provider: Kingsley Spittle MD   Encounter Date: 11/04/2017  PT End of Session - 11/04/17 0928    Visit Number  2    Number of Visits  13    Date for PT Re-Evaluation  12/10/17    PT Start Time  0905    PT Stop Time  0948    PT Time Calculation (min)  43 min    Activity Tolerance  Patient limited by pain;Patient tolerated treatment well    Behavior During Therapy  [facility] for tasks assessed/performed       History reviewed. No pertinent past medical history.  History reviewed. No pertinent surgical history.  There were no vitals filed for this visit.  Subjective Assessment - 11/04/17 0906    Subjective  Patient reports he had ~2 days relief from symptoms today. Today he reports minimal pain and is improving overall.     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 Pain is worsening at this time and is radiating to the left shoulder, none in right shoulder.     Limitations  Lifting;Sitting;House hold activities;Other (comment) turning head right/left    Patient Stated Goals  decrease pain in neck so he can return to PLOF    Currently in Pain?  Yes    Pain Score  7     Pain Location  Neck    Pain Orientation  Right;Left;Posterior    Pain Descriptors / Indicators  Aching;Spasm;Discomfort    Pain Type  Acute pain    Pain Radiating Towards  to left upper arm    Pain Onset  More than a month ago    Pain Frequency  Intermittent       Objective: AROM: cervical spine rotation decreased 50% each  direction with pain/discomfort and a "snapping" feeling with rotation to left  Palpation: increased tone, spasms upper trapezius and cervical spine paraspinal muscles left>>right  Treatment:  Manual Therapy: 23 min: goal : spasms, pain, improve ROM cervical spine STM superficial, stretching and compression to cervical spine, upper trapezius muscle and bilateral shoulder/upper back muscles with patient seated with UE's supported   Modalities: Electrical stimulation: 15 min: High volt estim.clincial program for muscle spasms  (4) electrodes applied to bilateral posterior cervical spine paraspinal muscles and upper trapezius muscles over spasms with intensity to tolerance with patient in sitting position with UE's supported goal: pain, spasms Moist heat applied to both shoulders, cervical spine with estim. For pain, spasms, no adverse reactions noted   Therapeutic Exercise: patient performed with instruction, demonstration, tactile and verbal cues and facilitation of therapist: goal: improved ROM, decrease pain, independent with home program   Sitting: Cervical spine retraction x 5-10 reps for correction of technique; improved with VC, demonstration and repetition Scapular retraction x 5 reps    Patient response to treatment: Patient demonstrated improved technique with exercise with repetition and repeated instruction. Patient reported decreased pain in cervical with improved ROM following treatment. Improved soft tissue elasticity with decreased spasm >  50% following STM       PT Education - 11/04/17 0927    Education Details  re assessed home program with correction of chin tucks    Person(s) Educated  Patient    Methods  Explanation;Demonstration;Verbal cues    Comprehension  Verbalized understanding;Returned demonstration;Verbal cues required          PT Long Term Goals - 10/28/17 1851      PT LONG TERM GOAL #1   Title  Patient will report max pain level to 5/10 to allow  improved function with daily activities involving neck     Baseline  pain ranges from 7-10/10 and worse with turning head    Status  New    Target Date  11/19/17      PT LONG TERM GOAL #2   Title  Patient will report max pain level to 3/10 to allow improved function with daily activities involving neck     Baseline  pain ranges from 7-10/10 and worse with turning head    Status  New    Target Date  12/10/17      PT LONG TERM GOAL #3   Title  patient will be independent with home program for pain control and exercises for posture to allow self management of symptoms once discharged from physical therapy    Baseline  no knowledge of appropriate pain control strategies, posture correction or exercise progression without guided instruction and cuing    Status  New    Target Date  12/10/17            Plan - 11/04/17 0928    Clinical Impression Statement  Patient demonstrated improvement with decreased spasms and increased soft tissue elasticity following STM and modalities. He continues with intermittent pain and limitations with turning head to left and will benefit from continued physical therapy intervention in order to return to prior level of function.     Rehab Potential  Good    Clinical Impairments Affecting Rehab Potential  (-)pain worsening with radating symptoms across back to left side/shoulder; multiple co morbidities    PT Frequency  2x / week    PT Duration  6 weeks    PT Treatment/Interventions  Iontophoresis 4mg /ml Dexamethasone;Cryotherapy;Electrical Stimulation;Ultrasound;Moist Heat;Therapeutic exercise;Therapeutic activities;Neuromuscular re-education;Patient/family education;Manual techniques;Dry needling    PT Next Visit Plan  pain control, electrical stimulation, [place], ther ex    PT Home Exercise Plan  posture awareness, correction, use of lumbar and cervical rolls for positioning sitting and sleeping       Patient will benefit from skilled therapeutic intervention  in order to improve the following deficits and impairments:  Pain, Postural dysfunction, Increased muscle spasms, Decreased activity tolerance, Decreased endurance, Decreased range of motion, Impaired perceived functional ability, Impaired UE functional use  Visit Diagnosis: Cervicalgia  Other muscle spasm     Problem List There are no active problems to display for this patient.   Jomarie Longs PT 11/04/2017, 10:22 AM  [facility] [address] [place], [address] Phone: [phone]   Fax:  [phone]  Name: ROCKY GLADDEN MRN: [id number] Date of Birth: 1946/01/21

## 2017-11-05 ENCOUNTER — Encounter: Payer: Medicare HMO | Admitting: Physical Therapy

## 2017-11-05 DIAGNOSIS — J441 Chronic obstructive pulmonary disease with (acute) exacerbation: Secondary | ICD-10-CM

## 2017-11-05 NOTE — Progress Notes (Signed)
Daily Session Note  Patient Details  Name: Joe Torres MRN: [id number] Date of Birth: Oct 28, 1945 Referring Provider:     Pulmonary Rehab from 08/19/2017 in [facility] and Pul[facility] Rehab  Referring Provider  Jeanne Ivan MD      Encounter Date: 11/05/2017  Check In: Session Check In - 11/05/17 1021      Check-In   Location  [facility]-Cardiac & Pulmonary Rehab    Staff Present  Justin Mend Lorre Nick, MA, RCEP, CCRP, Exercise Physiologist;Amanda Oletta Darter, [place], ACSM CEP, Exercise Physiologist    Supervising physician immediately available to respond to emergencies  [facility] immediately available ER MD    Physician(s)  Dr. Jimmye Norman and Quentin Cornwall    Medication changes reported      No    Fall or balance concerns reported     No    Tobacco Cessation  No Change    Warm-up and Cool-down  Performed as group-led instruction    Resistance Training Performed  Yes    VAD Patient?  No    PAD/SET Patient?  No      Pain Assessment   Currently in Pain?  No/denies          Social History   Tobacco Use  Smoking Status Former Smoker  . Packs/day: 1.50  . Years: 40.00  . Pack years: 60.00  . Types: Cigarettes  . Last attempt to quit: 05/21/2008  . Years since quitting: 9.4  Smokeless Tobacco Never Used    Goals Met:  Independence with exercise equipment Exercise tolerated well No report of cardiac concerns or symptoms Strength training completed today  Goals Unmet:  Not Applicable  Comments: Pt able to follow exercise prescription today without complaint.  Will continue to monitor for progression.   Dr. Emily Filbert is Medical Director for [place] and [facility].

## 2017-11-06 ENCOUNTER — Ambulatory Visit: Payer: Medicare HMO | Admitting: Physical Therapy

## 2017-11-06 ENCOUNTER — Encounter: Payer: Self-pay | Admitting: Physical Therapy

## 2017-11-06 DIAGNOSIS — M62838 Other muscle spasm: Secondary | ICD-10-CM

## 2017-11-06 DIAGNOSIS — M542 Cervicalgia: Secondary | ICD-10-CM

## 2017-11-06 NOTE — Therapy (Signed)
[facility] [address] [place], [address] Phone: [phone]   Fax:  [phone]  Physical Therapy Treatment  Patient Details  Name: Joe Torres MRN: [id number] Date of Birth: 1946-01-02 Referring Provider: Kingsley Spittle MD   Encounter Date: 11/06/2017  PT End of Session - 11/06/17 0910    Visit Number  3    Number of Visits  13    Date for PT Re-Evaluation  12/10/17    PT Start Time  0907    PT Stop Time  0947    PT Time Calculation (min)  40 min    Activity Tolerance  Patient limited by pain;Patient tolerated treatment well    Behavior During Therapy  [facility] for tasks assessed/performed       History reviewed. No pertinent past medical history.  History reviewed. No pertinent surgical history.  There were no vitals filed for this visit.  Subjective Assessment - 11/06/17 0907    Subjective  Patient reports right side of neck is okay and he had ~2 days of relief following previous treatment.     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 Pain is worsening at this time and is radiating to the left shoulder, none in right shoulder.     Limitations  Lifting;Sitting;House hold activities;Other (comment) turning head right/left    Patient Stated Goals  decrease pain in neck so he can return to PLOF    Currently in Pain?  Yes    Pain Score  7     Pain Location  Neck    Pain Orientation  Left;Posterior    Pain Descriptors / Indicators  Aching;Spasm;Discomfort    Pain Type  Acute pain    Pain Onset  More than a month ago    Pain Frequency  Intermittent              Objective: Palpation: increased tone, spasms upper and mid trapezius and cervical spine paraspinal muscles left    Treatment:  Manual Therapy: 10 min: goal : spasms, pain, improve ROM cervical spine STM superficial, stretching and compression to cervical spine, upper trapezius muscle and bilateral shoulder/upper back muscles with patient seated with UE's supported    Modalities: Electrical stimulation: 10 min: High volt estim.clincial program for muscle spasms  (4) electrodes applied to bilateral posterior cervical spine paraspinal muscles and upper trapezius muscles over spasms with intensity to tolerance with patient in sitting position with UE's supported goal: pain, spasms Moist heat applied to both shoulders, cervical spine with estim. For pain, spasms, no adverse reactions noted  US/estim combination:  8 min left side cerivcal spine and upper trapezius with patient seated with left UE supported: 1MHz 50% pulsed @ 1.3 w/cm2 and HVGS intensity to tolerance, contraction ~80 volts  Therapeutic Exercise: patient performed with instruction, demonstration, tactile and verbal cues and facilitation of therapist: goal: improved ROM, decrease pain, independent with home program   Sitting: Cervical spine retraction x 5-10 reps for correction of technique; improved with VC, tactile cues, demonstration and repetition standing scapular retraction high and shoulder extensionto hips with green resistive band x 5 reps each with instruction for home   Patient response to treatment:  improved technique with exercises following demonstration and with verbal and tactile cuing. improved soft tissue elasticity with decreased spasms by 50% following treatment with pain level decreased from 7/10 to 4/10.        PT Education - 11/06/17 1008    Education Details  HEP re assessed cervical retraction; added scapular retraction with green resistive band and shoulder extension to hips    Person(s) Educated  Patient    Methods  Explanation;Demonstration;Verbal cues    Comprehension  Verbal cues required;Returned  demonstration;Verbalized understanding          PT Long Term Goals - 10/28/17 1851      PT LONG TERM GOAL #1   Title  Patient will report max pain level to 5/10 to allow improved function with daily activities involving neck     Baseline  pain ranges from 7-10/10 and worse with turning head    Status  New    Target Date  11/19/17      PT LONG TERM GOAL #2   Title  Patient will report max pain level to 3/10 to allow improved function with daily activities involving neck     Baseline  pain ranges from 7-10/10 and worse with turning head    Status  New    Target Date  12/10/17      PT LONG TERM GOAL #3   Title  patient will be independent with home program for pain control and exercises for posture to allow self management of symptoms once discharged from physical therapy    Baseline  no knowledge of appropriate pain control strategies, posture correction or exercise progression without guided instruction and cuing    Status  New    Target Date  12/10/17            Plan - 11/06/17 0941    Clinical Impression Statement  Patient demonstrates improving spasms and decreasing pain in cervical spine with current treatment. He continues with limited ROM and intermittent symptoms with turning head to left. He should continue to progres with additinal physical therapy intervention     Rehab Potential  Good    Clinical Impairments Affecting Rehab Potential  (-)pain worsening with radating symptoms across back to left side/shoulder; multiple co morbidities    PT Frequency  2x / week    PT Duration  6 weeks    PT Treatment/Interventions  Iontophoresis 4mg /ml Dexamethasone;Cryotherapy;Electrical Stimulation;Ultrasound;Moist Heat;Therapeutic exercise;Therapeutic activities;Neuromuscular re-education;Patient/family education;Manual techniques;Dry needling    PT Next Visit Plan  pain control, electrical stimulation, [place], ther ex    PT Home Exercise Plan  posture awareness, correction, use of  lumbar and cervical rolls for positioning sitting and sleeping       Patient will benefit from skilled therapeutic intervention in order to improve the following deficits and impairments:  Pain, Postural dysfunction, Increased muscle spasms, Decreased activity tolerance, Decreased endurance, Decreased range of motion, Impaired perceived functional ability, Impaired UE functional use  Visit Diagnosis: Cervicalgia  Other muscle spasm     Problem List There are no active problems to display for this patient.   Jomarie Longs PT 11/06/2017, 10:09 AM  [facility] [address] [place], [address] Phone: [phone]   Fax:  [phone]  Name: Joe Torres MRN: [id number] Date of Birth: 03/05/1946

## 2017-11-07 DIAGNOSIS — J441 Chronic obstructive pulmonary disease with (acute) exacerbation: Secondary | ICD-10-CM

## 2017-11-07 NOTE — Progress Notes (Signed)
Daily Session Note  Patient Details  Name: Joe Torres MRN: [id number] Date of Birth: 02/29/1946 Referring Provider:     Pulmonary Rehab from 08/19/2017 in [facility]  Referring Provider  Chang, Lydia MD      Encounter Date: 11/07/2017  Check In: Session Check In - 11/07/17 0949      Check-In   Location  [facility]    Staff Present  Joseph Hood RCP,RRT,BSRT;Amanda Sommer, BA, ACSM CEP, Exercise Physiologist;Meredith Craven, RN BSN    Supervising physician immediately available to respond to emergencies  [facility] immediately available ER MD    Physician(s)  Component NameValueRef Range    Medication changes reported      No    Fall or balance concerns reported     No    Tobacco Cessation  No Change    Warm-up and Cool-down  Performed as group-led instruction    Resistance Training Performed  Yes    VAD Patient?  No    PAD/SET Patient?  No      Pain Assessment   Currently in Pain?  No/denies          Social History   Tobacco Use  Smoking Status Former Smoker  . Packs/day: 1.50  . Years: 40.00  . Pack years: 60.00  . Types: Cigarettes  . Last attempt to quit: 05/21/2008  . Years since quitting: 9.4  Smokeless Tobacco Never Used    Goals Met:  Independence with exercise equipment Exercise tolerated well No report of cardiac concerns or symptoms Strength training completed today  Goals Unmet:  Not Applicable  Comments: Pt able to follow exercise prescription today without complaint.  Will continue to monitor for progression.   Dr. Mark Miller is Medical Director for [facility] and [facility]. 

## 2017-11-10 ENCOUNTER — Encounter: Payer: Medicare HMO | Attending: Pulmonary Disease

## 2017-11-10 ENCOUNTER — Encounter: Payer: Medicare HMO | Admitting: Physical Therapy

## 2017-11-10 DIAGNOSIS — J441 Chronic obstructive pulmonary disease with (acute) exacerbation: Secondary | ICD-10-CM | POA: Insufficient documentation

## 2017-11-10 NOTE — Progress Notes (Signed)
Daily Session Note  Patient Details  Name: NYSHAUN STANDAGE MRN: [id number] Date of Birth: 05-Aug-1945 Referring Provider:     Pulmonary Rehab from 08/19/2017 in [facility]  Referring Provider  Jeanne Ivan MD      Encounter Date: 11/10/2017  Check In: Session Check In - 11/10/17 1049      Check-In   Location  [facility]    Staff Present  Justin Mend RCP,RRT,BSRT;Laureen Owens Shark, BS, RRT, Respiratory Therapist;Mandi Zachery Conch, BS, [place] physician immediately available to respond to emergencies  [facility] immediately available ER MD    Physician(s)  Dr. Corky Downs and Jimmye Norman    Medication changes reported      No    Fall or balance concerns reported     No    Warm-up and Cool-down  Performed as group-led instruction    Resistance Training Performed  Yes    VAD Patient?  No      Pain Assessment   Currently in Pain?  No/denies          Social History   Tobacco Use  Smoking Status Former Smoker  . Packs/day: 1.50  . Years: 40.00  . Pack years: 60.00  . Types: Cigarettes  . Last attempt to quit: 05/21/2008  . Years since quitting: 9.4  Smokeless Tobacco Never Used    Goals Met:  Independence with exercise equipment Exercise tolerated well No report of cardiac concerns or symptoms Strength training completed today  Goals Unmet:  Not Applicable  Comments: Pt able to follow exercise prescription today without complaint.  Will continue to monitor for progression.   Dr. Emily Filbert is Medical Director for [facility] and [facility].

## 2017-11-10 NOTE — Progress Notes (Signed)
Pulmonary Individual Treatment Plan  Patient Details  Name: Joe Torres MRN: [id number] Date of Birth: 1945-11-10 Referring Provider:     Pulmonary Rehab from 08/19/2017 in [facility] Cardiac and Pulmonary Rehab  Referring Provider  Jeanne Ivan MD      Initial Encounter Date:    Pulmonary Rehab from 08/19/2017 in [facility] Cardiac and Pulmonary Rehab  Date  08/19/17      Visit Diagnosis: COPD with acute exacerbation ([place])  Patient's Home Medications on Admission:  Current Outpatient Medications:  .  acetaminophen (TYLENOL) 500 MG tablet, Take 500 mg by mouth every 8 (eight) hours as needed., Disp: , Rfl:  .  albuterol (PROVENTIL HFA;VENTOLIN HFA) 108 (90 Base) MCG/ACT inhaler, Inhale 2 puffs into the lungs every 4 (four) hours as needed for wheezing or shortness of breath., Disp: , Rfl:  .  albuterol (PROVENTIL) (2.5 MG/3ML) 0.083% nebulizer solution, Take 2.5 mg by nebulization every 6 (six) hours as needed for wheezing or shortness of breath., Disp: , Rfl:  .  amLODipine (NORVASC) 10 MG tablet, Take 10 mg by mouth daily., Disp: , Rfl:  .  arformoterol (BROVANA) 15 MCG/2ML NEBU, Take 15 mcg by nebulization 2 (two) times daily., Disp: , Rfl:  .  cetirizine (ZYRTEC) 10 MG chewable tablet, Chew 10 mg by mouth daily., Disp: , Rfl:  .  hydrochlorothiazide (HYDRODIURIL) 25 MG tablet, Take 25 mg by mouth daily., Disp: , Rfl:  .  montelukast (SINGULAIR) 10 MG tablet, Take 10 mg by mouth at bedtime., Disp: , Rfl:  .  pantoprazole (PROTONIX) 40 MG tablet, Take 40 mg by mouth daily., Disp: , Rfl:  .  predniSONE (DELTASONE) 5 MG tablet, Take 5 mg by mouth daily with breakfast., Disp: , Rfl:  .  roflumilast (DALIRESP) 500 MCG TABS tablet, Take 500 mcg by mouth daily., Disp: , Rfl:  .  simvastatin (ZOCOR) 20 MG tablet, Take 20 mg by mouth daily., Disp: , Rfl:  .  tamsulosin (FLOMAX) 0.4 MG CAPS capsule, Take 0.4 mg by mouth., Disp: , Rfl:  .  traMADol (ULTRAM) 50 MG tablet, Take 50 mg by mouth every 6 (six)  hours as needed., Disp: , Rfl:   Past Medical History: No past medical history on file.  Tobacco Use: Social History   Tobacco Use  Smoking Status Former Smoker  . Packs/day: 1.50  . Years: 40.00  . Pack years: 60.00  . Types: Cigarettes  . Last attempt to quit: 05/21/2008  . Years since quitting: 9.4  Smokeless Tobacco Never Used    Labs: Recent Review Flowsheet Data    There is no flowsheet data to display.       Pulmonary Assessment Scores: Pulmonary Assessment Scores    Row Name 08/19/17 1132         ADL UCSD   ADL Phase  Entry     SOB Score total  92     Rest  2     Walk  3     Stairs  5     Bath  4     Dress  4     Shop  4       CAT Score   CAT Score  27       mMRC Score   mMRC Score  4        Pulmonary Function Assessment: Pulmonary Function Assessment - 08/19/17 1133      Breath   Bilateral Breath Sounds  Wheezes    Shortness of  Breath  Yes;Limiting activity;Panic with Shortness of Breath       Exercise Target Goals:    Exercise Program Goal: Individual exercise prescription set using results from initial 6 min walk test and THRR while considering  patient's activity barriers and safety.    Exercise Prescription Goal: Initial exercise prescription builds to 30-45 minutes a day of aerobic activity, 2-3 days per week.  Home exercise guidelines will be given to patient during program as part of exercise prescription that the participant will acknowledge.  Activity Barriers & Risk Stratification: Activity Barriers & Cardiac Risk Stratification - 08/19/17 1248      Activity Barriers & Cardiac Risk Stratification   Activity Barriers  Muscular Weakness;Deconditioning;Shortness of Breath;Joint Problems occasional L knee pain       6 Minute Walk: 6 Minute Walk    Row Name 08/19/17 1242         6 Minute Walk   Phase  Initial     Distance  810 feet     Walk Time  4.83 minutes     # of Rest Breaks  6 14 sec, 8 sec, 9 sec, 11 sec, 21  sec, 7 sec     MPH  1.91     METS  2.58     RPE  15     Perceived Dyspnea   4     VO2 Peak  9.02     Symptoms  Yes (comment)     Comments  SOB, hot in hallway at turn around     Resting HR  90 bpm     Resting BP  146/64     Resting Oxygen Saturation   94 %     Exercise Oxygen Saturation  during 6 min walk  87 %     Max Ex. HR  117 bpm     Max Ex. BP  164/74     2 Minute Post BP  156/62       Interval HR   1 Minute HR  107     2 Minute HR  109     3 Minute HR  114     4 Minute HR  115     5 Minute HR  116     6 Minute HR  117     2 Minute Post HR  108     Interval Heart Rate?  Yes       Interval Oxygen   Interval Oxygen?  Yes     Baseline Oxygen Saturation %  94 %     1 Minute Oxygen Saturation %  91 % rest 1:51-2:05     1 Minute Liters of Oxygen  0 L Room Air     2 Minute Oxygen Saturation %  88 % rest 2:36-2:44     2 Minute Liters of Oxygen  0 L     3 Minute Oxygen Saturation %  87 % 3:12-3:21, 3:49-4:00     3 Minute Liters of Oxygen  0 L     4 Minute Oxygen Saturation %  88 % rest 4:39-5:00     4 Minute Liters of Oxygen  0 L     5 Minute Oxygen Saturation %  88 % rest 5:23-5:30     5 Minute Liters of Oxygen  0 L     6 Minute Oxygen Saturation %  89 %     6 Minute Liters of Oxygen  0 L     2 Minute  Post Oxygen Saturation %  93 %     2 Minute Post Liters of Oxygen  0 L       Oxygen Initial Assessment: Oxygen Initial Assessment - 08/19/17 1139      Home Oxygen   Home Oxygen Device  Home Concentrator;E-Tanks    Sleep Oxygen Prescription  CPAP    Liters per minute  4    Home Exercise Oxygen Prescription  Continuous    Liters per minute  3    Home at Rest Exercise Oxygen Prescription  Continuous    Liters per minute  3    Compliance with Home Oxygen Use  Yes      Initial 6 min Walk   Oxygen Used  None      Program Oxygen Prescription   Program Oxygen Prescription  None      Intervention   Short Term Goals  To learn and exhibit compliance with exercise,  home and travel O2 prescription;To learn and understand importance of maintaining oxygen saturations>88%;To learn and demonstrate proper use of respiratory medications;To learn and demonstrate proper pursed lip breathing techniques or other breathing techniques.;To learn and understand importance of monitoring SPO2 with pulse oximeter and demonstrate accurate use of the pulse oximeter. Nebulizers albuterol BID. Takes 3 medications BID    Long  Term Goals  Exhibits compliance with exercise, home and travel O2 prescription;Verbalizes importance of monitoring SPO2 with pulse oximeter and return demonstration;Maintenance of O2 saturations>88%;Exhibits proper breathing techniques, such as pursed lip breathing or other method taught during program session;Compliance with respiratory medication;Demonstrates proper use of MDI's       Oxygen Re-Evaluation: Oxygen Re-Evaluation    Row Name 08/25/17 1029 11/05/17 1043           Program Oxygen Prescription   Program Oxygen Prescription  None  None        Home Oxygen   Home Oxygen Device  Home Concentrator;E-Tanks  Home Concentrator;E-Tanks      Sleep Oxygen Prescription  CPAP  CPAP      Liters per minute  4  3      Home Exercise Oxygen Prescription  Continuous  Continuous      Liters per minute  3  3      Home at Rest Exercise Oxygen Prescription  Continuous  Continuous he uses his oxygen at home as needed      Liters per minute  3  3      Compliance with Home Oxygen Use  Yes  Yes        Goals/Expected Outcomes   Short Term Goals  To learn and exhibit compliance with exercise, home and travel O2 prescription;To learn and understand importance of maintaining oxygen saturations>88%;To learn and demonstrate proper use of respiratory medications;To learn and demonstrate proper pursed lip breathing techniques or other breathing techniques.;To learn and understand importance of monitoring SPO2 with pulse oximeter and demonstrate accurate use of the pulse  oximeter.  To learn and exhibit compliance with exercise, home and travel O2 prescription;To learn and understand importance of maintaining oxygen saturations>88%;To learn and demonstrate proper use of respiratory medications;To learn and demonstrate proper pursed lip breathing techniques or other breathing techniques.;To learn and understand importance of monitoring SPO2 with pulse oximeter and demonstrate accurate use of the pulse oximeter.      Long  Term Goals  Exhibits compliance with exercise, home and travel O2 prescription;Verbalizes importance of monitoring SPO2 with pulse oximeter and return demonstration;Maintenance of O2 saturations>88%;Exhibits proper breathing techniques, such  as pursed lip breathing or other method taught during program session;Compliance with respiratory medication;Demonstrates proper use of MDI's  Exhibits compliance with exercise, home and travel O2 prescription;Verbalizes importance of monitoring SPO2 with pulse oximeter and return demonstration;Maintenance of O2 saturations>88%;Exhibits proper breathing techniques, such as pursed lip breathing or other method taught during program session;Compliance with respiratory medication;Demonstrates proper use of MDI's      Comments  Reviewed PLB technique with pt.  Talked about how it work and it's important to maintaining his exercise saturations.    Joe Torres is doing well at using his PLB when he is short of breath exercising. He is increasing his loads and is able to use the treadmill the whole time. He knows when he gets short of breath and takes a break if he needs to catch his breath. He checks his oxygen sometimes at home but not all the time. Informed him to check his oxygen when is is being active.      Goals/Expected Outcomes  Short: Become more profiecient at using PLB.   Long: Become independent at using PLB.  Short: check oxygen levels at home when he is active. Long: maintain checking oxygen levels at home independently          Oxygen Discharge (Final Oxygen Re-Evaluation): Oxygen Re-Evaluation - 11/05/17 1043      Program Oxygen Prescription   Program Oxygen Prescription  None      Home Oxygen   Home Oxygen Device  Home Concentrator;E-Tanks    Sleep Oxygen Prescription  CPAP    Liters per minute  3    Home Exercise Oxygen Prescription  Continuous    Liters per minute  3    Home at Rest Exercise Oxygen Prescription  Continuous he uses his oxygen at home as needed    Liters per minute  3    Compliance with Home Oxygen Use  Yes      Goals/Expected Outcomes   Short Term Goals  To learn and exhibit compliance with exercise, home and travel O2 prescription;To learn and understand importance of maintaining oxygen saturations>88%;To learn and demonstrate proper use of respiratory medications;To learn and demonstrate proper pursed lip breathing techniques or other breathing techniques.;To learn and understand importance of monitoring SPO2 with pulse oximeter and demonstrate accurate use of the pulse oximeter.    Long  Term Goals  Exhibits compliance with exercise, home and travel O2 prescription;Verbalizes importance of monitoring SPO2 with pulse oximeter and return demonstration;Maintenance of O2 saturations>88%;Exhibits proper breathing techniques, such as pursed lip breathing or other method taught during program session;Compliance with respiratory medication;Demonstrates proper use of MDI's    Comments  Versie is doing well at using his PLB when he is short of breath exercising. He is increasing his loads and is able to use the treadmill the whole time. He knows when he gets short of breath and takes a break if he needs to catch his breath. He checks his oxygen sometimes at home but not all the time. Informed him to check his oxygen when is is being active.    Goals/Expected Outcomes  Short: check oxygen levels at home when he is active. Long: maintain checking oxygen levels at home independently       Initial  Exercise Prescription: Initial Exercise Prescription - 08/19/17 1200      Date of Initial Exercise RX and Referring Provider   Date  08/19/17    Referring Provider  Jeanne Ivan MD      Treadmill  MPH  1.7    Grade  0.5    Minutes  15    METs  2.42      Recumbant Elliptical   Level  1    RPM  50    Minutes  15    METs  2.5      T5 Nustep   Level  1    SPM  80    Minutes  15    METs  2.5      Prescription Details   Frequency (times per week)  3    Duration  Progress to 45 minutes of aerobic exercise without signs/symptoms of physical distress      Intensity   THRR 40-80% of Max Heartrate  114-139    Ratings of Perceived Exertion  11-13    Perceived Dyspnea  0-4      Progression   Progression  Continue to progress workloads to maintain intensity without signs/symptoms of physical distress.      Resistance Training   Training Prescription  Yes    Weight  4lbs    Reps  10-15       Perform Capillary Blood Glucose checks as needed.  Exercise Prescription Changes: Exercise Prescription Changes    Row Name 08/19/17 1200 09/03/17 1100 09/17/17 1400 09/30/17 1300 10/13/17 1600     Response to Exercise   Blood Pressure (Admit)  146/64  122/80  130/60  140/72  144/72   Blood Pressure (Exercise)  164/74  156/74  -  -  -   Blood Pressure (Exit)  156/62  130/76  154/72  130/84  124/64   Heart Rate (Admit)  90 bpm  95 bpm  99 bpm  90 bpm  95 bpm   Heart Rate (Exercise)  117 bpm  115 bpm  119 bpm  107 bpm  111 bpm   Heart Rate (Exit)  108 bpm  107 bpm  104 bpm  91 bpm  104 bpm   Oxygen Saturation (Admit)  94 %  92 %  87 %  90 %  90 %   Oxygen Saturation (Exercise)  87 %  91 %  88 %  91 %  91 %   Oxygen Saturation (Exit)  93 %  93 %  89 %  90 %  94 %   Rating of Perceived Exertion (Exercise)  _0 Perceived Dyspnea (Exercise)  _1 Symptoms  SOB, hot in hall at turn  none  none  none  none   Comments  walk test results  -  -  -  -   Duration   -  Continue with 45 min of aerobic exercise without signs/symptoms of physical distress.  Continue with 45 min of aerobic exercise without signs/symptoms of physical distress.  Continue with 45 min of aerobic exercise without signs/symptoms of physical distress.  Continue with 45 min of aerobic exercise without signs/symptoms of physical distress.   Intensity  -  THRR unchanged  THRR unchanged  THRR unchanged  THRR unchanged     Progression   Progression  -  -  Continue to progress workloads to maintain intensity without signs/symptoms of physical distress.  Continue to progress workloads to maintain intensity without signs/symptoms of physical distress.  Continue to progress workloads to maintain intensity without signs/symptoms of physical distress.   Average METs  -  -  1.9  1.9  2.25     Resistance Training   Training Prescription  -  Yes  Yes  Yes  Yes   Weight  -  4 lb  4 lb  4 lb  4 lb   Reps  -  10-15  10-15  10-15  10-15     Interval Training   Interval Training  -  -  -  No  No     Treadmill   MPH  -  1  1.3  1.7  1.7   Grade  -  0  0  0  0   Minutes  -  _0 METs  -  1.77  2  2.3  2.3     Recumbant Elliptical   Level  -  _1 -   RPM  -  50  37  -  -   Minutes  -  _2 -   METs  -  1.5  1.7  1.5  -     T5 Nustep   Level  -  2  2  -  2   SPM  -  80  -  -  -   Minutes  -  15  15  -  15   METs  -  2.5  2.9  -  2.2   Row Name 10/22/17 1100 10/29/17 1300           Response to Exercise   Blood Pressure (Admit)  -  122/66      Blood Pressure (Exit)  -  132/62      Heart Rate (Admit)  -  96 bpm      Heart Rate (Exercise)  -  114 bpm      Heart Rate (Exit)  -  105 bpm      Oxygen Saturation (Admit)  -  89 %      Oxygen Saturation (Exercise)  -  90 %      Oxygen Saturation (Exit)  -  92 %      Rating of Perceived Exertion (Exercise)  -  15      Perceived Dyspnea (Exercise)  -  3      Symptoms  -  none      Duration  -  Continue with 45 min of  aerobic exercise without signs/symptoms of physical distress.      Intensity  -  THRR unchanged        Progression   Progression  -  Continue to progress workloads to maintain intensity without signs/symptoms of physical distress.      Average METs  -  2        Resistance Training   Training Prescription  -  Yes      Weight  -  4 lb      Reps  -  10-15        Interval Training   Interval Training  -  No        Treadmill   MPH  -  1.7      Grade  -  0      Minutes  -  15      METs  -  2.3        Recumbant Elliptical   Level  -  2      RPM  -  30  Minutes  -  15      METs  -  1.6        Home Exercise Plan   Plans to continue exercise at  Home (comment) walk  Home (comment) walk      Frequency  Add 1 additional day to program exercise sessions.  Add 1 additional day to program exercise sessions.      Initial Home Exercises Provided  10/22/17 reviewed with staff today, interpreter last week  10/22/17 reviewed with staff today, interpreter last week         Exercise Comments: Exercise Comments    Row Name 08/25/17 1028           Exercise Comments  First full day of exercise!  Patient was oriented to gym and equipment including functions, settings, policies, and procedures.  Patient's individual exercise prescription and treatment plan were reviewed.  All starting workloads were established based on the results of the 6 minute walk test done at initial orientation visit.  The plan for exercise progression was also introduced and progression will be customized based on patient's performance and goals.          Exercise Goals and Review: Exercise Goals    Row Name 08/19/17 1251             Exercise Goals   Increase Physical Activity  Yes       Intervention  Provide advice, education, support and counseling about physical activity/exercise needs.;Develop an individualized exercise prescription for aerobic and resistive training based on initial evaluation findings,  risk stratification, comorbidities and participant's personal goals.       Expected Outcomes  Short Term: Attend rehab on a regular basis to increase amount of physical activity.;Long Term: Add in home exercise to make exercise part of routine and to increase amount of physical activity.;Long Term: Exercising regularly at least 3-5 days a week.       Increase Strength and Stamina  Yes       Intervention  Provide advice, education, support and counseling about physical activity/exercise needs.;Develop an individualized exercise prescription for aerobic and resistive training based on initial evaluation findings, risk stratification, comorbidities and participant's personal goals.       Expected Outcomes  Short Term: Increase workloads from initial exercise prescription for resistance, speed, and METs.;Short Term: Perform resistance training exercises routinely during rehab and add in resistance training at home;Long Term: Improve cardiorespiratory fitness, muscular endurance and strength as measured by increased METs and functional capacity (6MWT)       Able to understand and use rate of perceived exertion (RPE) scale  Yes       Intervention  Provide education and explanation on how to use RPE scale       Expected Outcomes  Short Term: Able to use RPE daily in rehab to express subjective intensity level;Long Term:  Able to use RPE to guide intensity level when exercising independently       Able to understand and use Dyspnea scale  Yes       Intervention  Provide education and explanation on how to use Dyspnea scale       Expected Outcomes  Short Term: Able to use Dyspnea scale daily in rehab to express subjective sense of shortness of breath during exertion;Long Term: Able to use Dyspnea scale to guide intensity level when exercising independently       Knowledge and understanding of Target Heart Rate Range (THRR)  Yes  Intervention  Provide education and explanation of THRR including how the  numbers were predicted and where they are located for reference       Expected Outcomes  Short Term: Able to state/look up THRR;Short Term: Able to use daily as guideline for intensity in rehab;Long Term: Able to use THRR to govern intensity when exercising independently       Able to check pulse independently  Yes       Intervention  Provide education and demonstration on how to check pulse in carotid and radial arteries.;Review the importance of being able to check your own pulse for safety during independent exercise       Expected Outcomes  Short Term: Able to explain why pulse checking is important during independent exercise;Long Term: Able to check pulse independently and accurately       Understanding of Exercise Prescription  Yes       Intervention  Provide education, explanation, and written materials on patient's individual exercise prescription       Expected Outcomes  Short Term: Able to explain program exercise prescription;Long Term: Able to explain home exercise prescription to exercise independently          Exercise Goals Re-Evaluation : Exercise Goals Re-Evaluation    Row Name 08/25/17 1028 09/03/17 1133 09/17/17 1445 09/30/17 1330 10/13/17 1607     Exercise Goal Re-Evaluation   Exercise Goals Review  Understanding of Exercise Prescription;Able to understand and use Dyspnea scale;Knowledge and understanding of Target Heart Rate Range (THRR);Able to understand and use rate of perceived exertion (RPE) scale  Increase Physical Activity;Able to understand and use rate of perceived exertion (RPE) scale;Increase Strength and Stamina;Able to understand and use Dyspnea scale  Increase Physical Activity;Able to understand and use rate of perceived exertion (RPE) scale;Increase Strength and Stamina;Able to understand and use Dyspnea scale  Increase Physical Activity;Increase Strength and Stamina;Able to understand and use Dyspnea scale;Able to understand and use rate of perceived exertion  (RPE) scale  Increase Physical Activity;Increase Strength and Stamina;Able to understand and use Dyspnea scale;Able to understand and use rate of perceived exertion (RPE) scale   Comments  Reviewed RPE scale, THR and program prescription with pt today.  Pt voiced understanding and was given a copy of goals to take home.   Joe Torres is tolerating exercise well.  Staff will continue to monitor.  Joe Torres has been able to work longer without stopping for a break on machines.  He is working on PLB more to control SOB.  Staff will continue to monitor.  Pt is able to work longer on equipment without rest breaks.  MET level is about the same but duration has improved.  Pt has improved overall MET level slightly.  He has increased speed on TM.  He ha smade progress in endurance and does not have to rest as much on machines.   Expected Outcomes  Short: Use RPE daily to regulate intensity.  Long: Follow program prescription in THR.  Joe Torres will attend class regularly  Joe Torres will improve overall fitness level  Joe Torres will attend class 2-3 days per week Joe Torres will improve MET level  Short - pt will continue to attend Long - Pt will increase MET level  Short - Pt will add inlcine to TM Long - Pt will increase MET level higher than current level   Row Name 10/22/17 1146 10/29/17 1309           Exercise Goal Re-Evaluation  Exercise Goals Review  Increase Physical Activity;Able to understand and use rate of perceived exertion (RPE) scale;Knowledge and understanding of Target Heart Rate Range (THRR);Increase Strength and Stamina;Able to understand and use Dyspnea scale  Increase Physical Activity;Able to understand and use rate of perceived exertion (RPE) scale;Knowledge and understanding of Target Heart Rate Range (THRR);Understanding of Exercise Prescription;Increase Strength and Stamina;Able to understand and use Dyspnea scale      Comments  Joe Torres had the interpreter review home exercise last week.  This  EP reviewed to day to check for other questions or concerns.  RPE, HR safety reviewed. Pt voiced understanding  Pt has not been consistent attending 2-3 days per week. Regular attendance will enhance progress.      Expected Outcomes  Short - Joe Torres  will walk at home on days not at [facility] and monitor 02 and HR with [place] will maintain exercise on his own  Short - Pt will attend 2-3 days per week Long - pt will maintain fitness on his own         Discharge Exercise Prescription (Final Exercise Prescription Changes): Exercise Prescription Changes - 10/29/17 1300      Response to Exercise   Blood Pressure (Admit)  122/66    Blood Pressure (Exit)  132/62    Heart Rate (Admit)  96 bpm    Heart Rate (Exercise)  114 bpm    Heart Rate (Exit)  105 bpm    Oxygen Saturation (Admit)  89 %    Oxygen Saturation (Exercise)  90 %    Oxygen Saturation (Exit)  92 %    Rating of Perceived Exertion (Exercise)  15    Perceived Dyspnea (Exercise)  3    Symptoms  none    Duration  Continue with 45 min of aerobic exercise without signs/symptoms of physical distress.    Intensity  THRR unchanged      Progression   Progression  Continue to progress workloads to maintain intensity without signs/symptoms of physical distress.    Average METs  2      Resistance Training   Training Prescription  Yes    Weight  4 lb    Reps  10-15      Interval Training   Interval Training  No      Treadmill   MPH  1.7    Grade  0    Minutes  15    METs  2.3      Recumbant Elliptical   Level  2    RPM  30    Minutes  15    METs  1.6      Home Exercise Plan   Plans to continue exercise at  Home (comment) walk    Frequency  Add 1 additional day to program exercise sessions.    Initial Home Exercises Provided  10/22/17 reviewed with staff today, interpreter last week       Nutrition:  Target Goals: Understanding of nutrition guidelines, daily intake of sodium <158m, cholesterol <2052m calories 30%  from fat and 7% or less from saturated fats, daily to have 5 or more servings of fruits and vegetables.  Biometrics: Pre Biometrics - 08/19/17 1252      Pre Biometrics   Height  5' 6.1" (1.679 m)    Weight  166 lb (75.3 kg)    Waist Circumference  39 inches    Hip Circumference  38 inches    Waist to Hip Ratio  1.03 %  BMI (Calculated)  26.71        Nutrition Therapy Plan and Nutrition Goals: Nutrition Therapy & Goals - 10/03/17 1201      Nutrition Therapy   RD appointment deferred  Yes       Nutrition Assessments: Nutrition Assessments - 08/19/17 1211      MEDFICTS Scores   Pre Score  50       Nutrition Goals Re-Evaluation: Nutrition Goals Re-Evaluation    Joe Torres Name 11/05/17 1114             Goals   Current Weight  160 lb (72.6 kg)       Nutrition Goal  Dasani wants to learn how to eat better and lose some weight.       Comment  Joe Torres wants to make sure he is getting enough nutients and meet with the dietician       Expected Outcome  Short: meet with the dietician. Long: adhere to a diet plan          Nutrition Goals Discharge (Final Nutrition Goals Re-Evaluation): Nutrition Goals Re-Evaluation - 11/05/17 1114      Goals   Current Weight  160 lb (72.6 kg)    Nutrition Goal  Joe Torres wants to learn how to eat better and lose some weight.    Comment  Joe Torres wants to make sure he is getting enough nutients and meet with the dietician    Expected Outcome  Short: meet with the dietician. Long: adhere to a diet plan       Psychosocial: Target Goals: Acknowledge presence or absence of significant depression and/or stress, maximize coping skills, provide positive support system. Participant is able to verbalize types and ability to use techniques and skills needed for reducing stress and depression.   Initial Review & Psychosocial Screening: Initial Psych Review & Screening - 08/19/17 1127      Initial Review   Current issues with  Current Stress Concerns    Source  of Stress Concerns  Chronic Illness    Comments  He is not able to do yard work and be outside like he used to.      Family Dynamics   Good Support System?  Yes    Comments  His daughter is a good support system      Barriers   Psychosocial barriers to participate in program  The patient should benefit from training in stress management and relaxation.      Screening Interventions   Interventions  Encouraged to exercise;Program counselor consult;To provide support and resources with identified psychosocial needs;Provide feedback about the scores to participant    Expected Outcomes  Short Term goal: Utilizing psychosocial counselor, staff and physician to assist with identification of specific Stressors or current issues interfering with healing process. Setting desired goal for each stressor or current issue identified.;Long Term Goal: Stressors or current issues are controlled or eliminated.;Short Term goal: Identification and review with participant of any Quality of Life or Depression concerns found by scoring the questionnaire.;Long Term goal: The participant improves quality of Life and PHQ9 Scores as seen by post scores and/or verbalization of changes       Quality of Life Scores:  Scores of 19 and below usually indicate a poorer quality of life in these areas.  A difference of  2-3 points is a clinically meaningful difference.  A difference of 2-3 points in the total score of the Quality of Life Index has been associated with significant improvement in overall  quality of life, self-image, physical symptoms, and general health in studies assessing change in quality of life.  PHQ-9: Recent Review Flowsheet Data    Depression screen [facility] 2/9 08/19/2017   Decreased Interest 2   Down, Depressed, Hopeless 1   PHQ - 2 Score 3   Altered sleeping 1   Tired, decreased energy 1   Change in appetite 0   Feeling bad or failure about yourself  0   Trouble concentrating 1   Moving slowly or  fidgety/restless 0   Suicidal thoughts 0   PHQ-9 Score 6   Difficult doing work/chores Somewhat difficult     Interpretation of Total Score  Total Score Depression Severity:  1-4 = Minimal depression, 5-9 = Mild depression, 10-14 = Moderate depression, 15-19 = Moderately severe depression, 20-27 = Severe depression   Psychosocial Evaluation and Intervention: Psychosocial Evaluation - 08/27/17 1214      Psychosocial Evaluation & Interventions   Interventions  Encouraged to exercise with the program and follow exercise prescription    Comments  Counselor met with Mr. [facility]) along with his Spanish interpreter today for the initial psychosocial evaluation.  He is a 72 year old who struggles with COPD.  Joe Torres has a strong support system with a spouse; daughter and grandchildren who live locally.  He considers himself in good health mostly; and he sleeps well and has a "super" appetite.  Orian denies a history of depression or anxiety and states he is typically in a positive mood.  He reports some stress with his health condition and not being able to do the activities he used to enjoy.  Finnegan has goals for this program to feel better and breathe better.      Expected Outcomes  Short:  Joe Torres will exercise for stress and health benefits.   Long:  Joe Torres will continue to exercise consistently to improve his quality of life.      Continue Psychosocial Services   Follow up required by staff       Psychosocial Re-Evaluation: Psychosocial Re-Evaluation    [place] Name 10/03/17 1204             Psychosocial Re-Evaluation   Current issues with  Current Stress Concerns       Comments  no change in stress       Interventions  Encouraged to attend Pulmonary Rehabilitation for the exercise       Continue Psychosocial Services   Follow up required by staff          Psychosocial Discharge (Final Psychosocial Re-Evaluation): Psychosocial Re-Evaluation - 10/03/17 1204      Psychosocial Re-Evaluation    Current issues with  Current Stress Concerns    Comments  no change in stress    Interventions  Encouraged to attend Pulmonary Rehabilitation for the exercise    Continue Psychosocial Services   Follow up required by staff       Education: Education Goals: Education classes will be provided on a weekly basis, covering required topics. Participant will state understanding/return demonstration of topics presented.  Learning Barriers/Preferences: Learning Barriers/Preferences - 08/19/17 1136      Learning Barriers/Preferences   Learning Barriers  Sight;Language;Exercise Concerns wears glasses    Learning Preferences  Individual Instruction       Education Topics:  Initial Evaluation Education: - Verbal, written and demonstration of respiratory meds, oximetry and breathing techniques. Instruction on use of nebulizers and MDIs and importance of monitoring MDI activations.   Pulmonary Rehab from  11/05/2017 in [facility] Cardiac and Pulmonary Rehab  Date  08/19/17  Educator  [facility]  Instruction Review Code  1- Verbalizes Understanding      General Nutrition Guidelines/Fats and Fiber: -Group instruction provided by verbal, written material, models and posters to present the general guidelines for heart healthy nutrition. Gives an explanation and review of dietary fats and fiber.   Pulmonary Rehab from 11/05/2017 in [facility] Cardiac and Pulmonary Rehab  Date  09/29/17  Educator  CR  Instruction Review Code  1- Verbalizes Understanding      Controlling Sodium/Reading Food Labels: -Group verbal and written material supporting the discussion of sodium use in heart healthy nutrition. Review and explanation with models, verbal and written materials for utilization of the food label.   Pulmonary Rehab from 11/05/2017 in [facility] Cardiac and Pulmonary Rehab  Date  10/13/17  Educator  CR  Instruction Review Code  1- Verbalizes Understanding      Exercise Physiology & General Exercise Guidelines: - Group  verbal and written instruction with models to review the exercise physiology of the cardiovascular system and associated critical values. Provides general exercise guidelines with specific guidelines to those with heart or lung disease.    Pulmonary Rehab from 11/05/2017 in [facility] Cardiac and Pulmonary Rehab  Date  10/31/17  Educator  [facility]  Instruction Review Code  1- Verbalizes Understanding      Aerobic Exercise & Resistance Training: - Gives group verbal and written instruction on the various components of exercise. Focuses on aerobic and resistive training programs and the benefits of this training and how to safely progress through these programs.   Pulmonary Rehab from 11/05/2017 in [facility] Cardiac and Pulmonary Rehab  Date  09/05/17  Educator  [facility]  Instruction Review Code  1- Verbalizes Understanding      Flexibility, Balance, Mind/Body Relaxation: Provides group verbal/written instruction on the benefits of flexibility and balance training, including mind/body exercise modes such as yoga, pilates and tai chi.  Demonstration and skill practice provided.   Stress and Anxiety: - Provides group verbal and written instruction about the health risks of elevated stress and causes of high stress.  Discuss the correlation between heart/lung disease and anxiety and treatment options. Review healthy ways to manage with stress and anxiety.   Depression: - Provides group verbal and written instruction on the correlation between heart/lung disease and depressed mood, treatment options, and the stigmas associated with seeking treatment.   Pulmonary Rehab from 11/05/2017 in [facility] Cardiac and Pulmonary Rehab  Date  09/24/17  Educator  [facility]  Instruction Review Code  1- Verbalizes Understanding      Exercise & Equipment Safety: - Individual verbal instruction and demonstration of equipment use and safety with use of the equipment.   Pulmonary Rehab from 11/05/2017 in [facility] Cardiac and Pulmonary Rehab  Date   08/19/17  Educator  [facility]  Instruction Review Code  1- Verbalizes Understanding      Infection Prevention: - Provides verbal and written material to individual with discussion of infection control including proper hand washing and proper equipment cleaning during exercise session.   Pulmonary Rehab from 11/05/2017 in [facility] Cardiac and Pulmonary Rehab  Date  08/19/17  Educator  [facility]  Instruction Review Code  1- Verbalizes Understanding      Falls Prevention: - Provides verbal and written material to individual with discussion of falls prevention and safety.   Pulmonary Rehab from 11/05/2017 in [facility] Cardiac and Pulmonary Rehab  Date  08/19/17  Educator  [facility]  Instruction Review Code  1- Verbalizes Understanding      Diabetes: - Individual verbal and written instruction to review signs/symptoms of diabetes, desired ranges of glucose level fasting, after meals and with exercise. Advice that pre and post exercise glucose checks will be done for 3 sessions at entry of program.   Chronic Lung Diseases: - Group verbal and written instruction to review updates, respiratory medications, advancements in procedures and treatments. Discuss use of supplemental oxygen including available portable oxygen systems, continuous and intermittent flow rates, concentrators, personal use and safety guidelines. Review proper use of inhaler and spacers. Provide informative websites for self-education.    Pulmonary Rehab from 11/05/2017 in [facility] Cardiac and Pulmonary Rehab  Date  10/29/17  Educator  [facility]  Instruction Review Code  1- Verbalizes Understanding      Energy Conservation: - Provide group verbal and written instruction for methods to conserve energy, plan and organize activities. Instruct on pacing techniques, use of adaptive equipment and posture/positioning to relieve shortness of breath.   Pulmonary Rehab from 11/05/2017 in [facility] Cardiac and Pulmonary Rehab  Date  10/01/17  Educator  [facility]  Instruction  Review Code  1- Verbalizes Understanding      Triggers and Exacerbations: - Group verbal and written instruction to review types of environmental triggers and ways to prevent exacerbations. Discuss weather changes, air quality and the benefits of nasal washing. Review warning signs and symptoms to help prevent infections. Discuss techniques for effective airway clearance, coughing, and vibrations.   AED/CPR: - Group verbal and written instruction with the use of models to demonstrate the basic use of the AED with the basic ABC's of resuscitation.   Pulmonary Rehab from 11/05/2017 in [facility] Cardiac and Pulmonary Rehab  Date  10/03/17  Educator  [place]  Instruction Review Code  1- Verbalizes Understanding      Anatomy and Physiology of the Lungs: - Group verbal and written instruction with the use of models to provide basic lung anatomy and physiology related to function, structure and complications of lung disease.   Pulmonary Rehab from 11/05/2017 in [facility] Cardiac and Pulmonary Rehab  Date  10/15/17  Educator  [facility]  Instruction Review Code  1- Verbalizes Understanding      Anatomy & Physiology of the Heart: - Group verbal and written instruction and models provide basic cardiac anatomy and physiology, with the coronary electrical and arterial systems. Review of Valvular disease and Heart Failure   Pulmonary Rehab from 11/05/2017 in [facility] Cardiac and Pulmonary Rehab  Date  09/10/17  Educator  [facility]  Instruction Review Code  1- Verbalizes Understanding      Cardiac Medications: - Group verbal and written instruction to review commonly prescribed medications for heart disease. Reviews the medication, class of the drug, and side effects.   Pulmonary Rehab from 11/05/2017 in [facility] Cardiac and Pulmonary Rehab  Date  09/19/17  Educator  [facility]  Instruction Review Code  1- Verbalizes Understanding      Know Your Numbers and Risk Factors: -Group verbal and written instruction about important numbers in  your health.  Discussion of what are risk factors and how they play a role in the disease process.  Review of Cholesterol, Blood Pressure, Diabetes, and BMI and the role they play in your overall health.   Sleep Hygiene: -Provides group verbal and written instruction about how sleep can affect your health.  Define sleep hygiene, discuss sleep cycles and impact of sleep habits. Review good sleep hygiene tips.  Pulmonary Rehab from 11/05/2017 in [facility] Cardiac and Pulmonary Rehab  Date  11/05/17  Educator  [facility]  Instruction Review Code  1- Verbalizes Understanding      Other: -Provides group and verbal instruction on various topics (see comments)    Knowledge Questionnaire Score: Knowledge Questionnaire Score - 08/19/17 1135      Knowledge Questionnaire Score   Pre Score  16/18 reviewed with patient        Core Components/Risk Factors/Patient Goals at Admission: Personal Goals and Risk Factors at Admission - 08/19/17 1144      Core Components/Risk Factors/Patient Goals on Admission    Weight Management  Yes;Weight Maintenance;Weight Loss    Intervention  Weight Management: Develop a combined nutrition and exercise program designed to reach desired caloric intake, while maintaining appropriate intake of nutrient and fiber, sodium and fats, and appropriate energy expenditure required for the weight goal.;Weight Management: Provide education and appropriate resources to help participant work on and attain dietary goals.;Weight Management/Obesity: Establish reasonable short term and long term weight goals.    Admit Weight  166 lb (75.3 kg)    Goal Weight: Short Term  161 lb (73 kg)    Goal Weight: Long Term  155 lb (70.3 kg)    Expected Outcomes  Short Term: Continue to assess and modify interventions until short term weight is achieved;Long Term: Adherence to nutrition and physical activity/exercise program aimed toward attainment of established weight goal;Weight Maintenance: Understanding  of the daily nutrition guidelines, which includes 25-35% calories from fat, 7% or less cal from saturated fats, less than 220m cholesterol, less than 1.5gm of sodium, & 5 or more servings of fruits and vegetables daily;Understanding recommendations for meals to include 15-35% energy as protein, 25-35% energy from fat, 35-60% energy from carbohydrates, less than 2076mof dietary cholesterol, 20-35 gm of total fiber daily;Understanding of distribution of calorie intake throughout the day with the consumption of 4-5 meals/snacks;Weight Loss: Understanding of general recommendations for a balanced deficit meal plan, which promotes 1-2 lb weight loss per week and includes a negative energy balance of [phone] kcal/d    Improve shortness of breath with ADL's  Yes    Intervention  Provide education, individualized exercise plan and daily activity instruction to help decrease symptoms of SOB with activities of daily living.    Expected Outcomes  Short Term: Improve cardiorespiratory fitness to achieve a reduction of symptoms when performing ADLs;Long Term: Be able to perform more ADLs without symptoms or delay the onset of symptoms    Hypertension  Yes    Intervention  Provide education on lifestyle modifcations including regular physical activity/exercise, weight management, moderate sodium restriction and increased consumption of fresh fruit, vegetables, and low fat dairy, alcohol moderation, and smoking cessation.;Monitor prescription use compliance. takes medication for blood pressure    Expected Outcomes  Short Term: Continued assessment and intervention until BP is < 140/9038mG in hypertensive participants. < 130/51m64m in hypertensive participants with diabetes, heart failure or chronic kidney disease.;Long Term: Maintenance of blood pressure at goal levels. at home 140-145/80's.    Lipids  Yes    Intervention  Provide education and support for participant on nutrition & aerobic/resistive exercise along  with prescribed medications to achieve LDL <70mg15mL >40mg.4mExpected Outcomes  Short Term: Participant states understanding of desired cholesterol values and is compliant with medications prescribed. Participant is following exercise prescription and nutrition guidelines.;Long Term: Cholesterol controlled with medications as prescribed, with individualized exercise RX and with  personalized nutrition plan. Value goals: LDL < 26m, HDL > 40 mg.       Core Components/Risk Factors/Patient Goals Review:  Goals and Risk Factor Review    Row Name 10/03/17 1201 11/05/17 1051           Core Components/Risk Factors/Patient Goals Review   Personal Goals Review  Weight Management/Obesity;Improve shortness of breath with ADL's;Develop more efficient breathing techniques such as purse lipped breathing and diaphragmatic breathing and practicing self-pacing with activity.;Stress  Weight Management/Obesity;Improve shortness of breath with ADL's;Hypertension      Review  Pt states he can tell he is breathing better and able to walk further.  He has made small steps of progress and wants to continue to get back to his normal.  He doesn't have any changes in stress levels.  Zackeriah states he is able to do more at home since his breathing is better. His weight has gone done to 160 pounds. His blood pressure has been going down since he started the program. He checks his blood pressure at home sometimes but needs to check it more often to make sure he is correlating to out measurements.      Expected Outcomes  Short - Pt will attend class regularly Long - Pt will improve oevrall MET level further  Short: check blood pressure at home. Long: maintain checking blood pressure at home once a day.         Core Components/Risk Factors/Patient Goals at Discharge (Final Review):  Goals and Risk Factor Review - 11/05/17 1051      Core Components/Risk Factors/Patient Goals Review   Personal Goals Review  Weight  Management/Obesity;Improve shortness of breath with ADL's;Hypertension    Review  JKhairistates he is able to do more at home since his breathing is better. His weight has gone done to 160 pounds. His blood pressure has been going down since he started the program. He checks his blood pressure at home sometimes but needs to check it more often to make sure he is correlating to out measurements.    Expected Outcomes  Short: check blood pressure at home. Long: maintain checking blood pressure at home once a day.       ITP Comments: ITP Comments    Row Name 08/19/17 1046 09/15/17 0827 10/13/17 0820 11/10/17 0946     ITP Comments  Medical Evaluation completed. Chart sent for review and changes to Dr. MEmily FilbertDirector of LV[place] Diagnosis can be found in [facility] encounter 08/01/17   30 day review completed. ITP sent to Dr. MEmily FilbertDirector of [place] Continue with ITP unless changes are made by physician   30 day review completed. ITP sent to Dr. MEmily FilbertDirector of [place] Continue with ITP unless changes are made by physician   30 day review completed. ITP sent to Dr. MEmily FilbertDirector of LM[place] Continue with ITP unless changes are made by physician       Comments: 30 day review

## 2017-11-11 ENCOUNTER — Encounter: Payer: Self-pay | Admitting: Physical Therapy

## 2017-11-11 ENCOUNTER — Ambulatory Visit: Payer: Medicare HMO | Attending: Internal Medicine | Admitting: Physical Therapy

## 2017-11-11 DIAGNOSIS — M542 Cervicalgia: Secondary | ICD-10-CM | POA: Insufficient documentation

## 2017-11-11 DIAGNOSIS — M62838 Other muscle spasm: Secondary | ICD-10-CM | POA: Diagnosis present

## 2017-11-11 NOTE — Therapy (Signed)
[facility] [address], [place], [address] Phone: [phone]   Fax:  [phone]  Physical Therapy Treatment  Patient Details  Name: Joe Torres MRN: [id number] Date of Birth: [birth year] Referring Provider: Kingsley Spittle MD   Encounter Date: 11/11/2017  PT End of Session - 11/11/17 1019    Visit Number  4    Number of Visits  13    Date for PT Re-Evaluation  12/10/17    PT Start Time  0907    PT Stop Time  0955    PT Time Calculation (min)  48 min    Activity Tolerance  Patient limited by pain;Patient tolerated treatment well    Behavior During Therapy  [facility] for tasks assessed/performed       History reviewed. No pertinent past medical history.  History reviewed. No pertinent surgical history.  There were no vitals filed for this visit.  Subjective Assessment - 11/11/17 0910    Subjective  Patient with increased symptoms with bending forward and backwards, better with rotations and still more painfull with extension.  Patient reports he has no more numbness or ear pain left side.    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 Pain is worsening at this time and is radiating to the left shoulder, none in right shoulder.     Limitations  Lifting;Sitting;House hold activities;Other (comment) turning head right/left    Patient Stated Goals  decrease pain in neck so he can return to PLOF    Currently in Pain?  Yes    Pain Score  6     Pain Location  Neck    Pain Orientation  Left;Posterior    Pain Descriptors / Indicators  Aching;Spasm    Pain Type  Acute pain    Pain Onset  More than a month ago    Pain Frequency  Intermittent       Objective: Palpation: mild to moderate spasms  upper and mid trapezius and cervical spine paraspinal muscles left Cervical spine: AROM flexion 50 with tightness end range, extension 50 with reported pain left posterior region   Treatment:  Manual Therapy: 13 min: goal : spasms, pain, improve ROM cervical spine STM superficial, stretching and compression to cervical spine, upper trapezius muscle and bilateral shoulder/upper back muscles with patient seated with UE's supported    Modalities: Electrical stimulation: 15 min: High volt estim.clincial program for muscle spasms  (4) electrodes applied to bilateral posterior cervical spine paraspinal muscles and upper trapezius muscles over spasms with intensity to tolerance with patient in sitting position with UE's supported goal: pain, spasms Moist heat applied to both shoulders, cervical spine with estim. For pain, spasms, no adverse reactions noted   US/estim combination:  8 min left side cerivcal spine and upper trapezius with patient seated with left UE supported: 1MHz 50% pulsed @ 1.3 w/cm2 and HVGS intensity to tolerance, contraction ~80 volts   Therapeutic Exercise: patient performed with instruction, demonstration, tactile and verbal cues and facilitation of therapist: goal: improved ROM, decrease pain, independent with home program   Sitting: Cervical spine retraction x 5-10 reps for correction of technique; improved with VC, tactile cues, demonstration and repetition standing  scapular retraction high and shoulder extensionto hips with green resistive band x 5 reps each with instruction for : required repeated demonstration and instruction for correct technique   Patient response to treatment: Improved spasms and decreased tenderness 50% with STM. decreased pain from 6/10 to 4/10 and able to perform extension cervical spine to 50 degrees with minimal discomfort.      PT Education - 11/11/17 1019    Education Details  exercise instruction with re assessment of HEP    Person(s)  Educated  Patient    Methods  Explanation;Demonstration;Verbal cues    Comprehension  Verbalized understanding;Returned demonstration;Verbal cues required          PT Long Term Goals - 10/28/17 1851      PT LONG TERM GOAL #1   Title  Patient will report max pain level to 5/10 to allow improved function with daily activities involving neck     Baseline  pain ranges from 7-10/10 and worse with turning head    Status  New    Target Date  11/19/17      PT LONG TERM GOAL #2   Title  Patient will report max pain level to 3/10 to allow improved function with daily activities involving neck     Baseline  pain ranges from 7-10/10 and worse with turning head    Status  New    Target Date  12/10/17      PT LONG TERM GOAL #3   Title  patient will be independent with home program for pain control and exercises for posture to allow self management of symptoms once discharged from physical therapy    Baseline  no knowledge of appropriate pain control strategies, posture correction or exercise progression without guided instruction and cuing    Status  New    Target Date  12/10/17            Plan - 11/11/17 1024    Clinical Impression Statement  Patient is progressing steadily with goals and demonstrates decreasing spasms and improvement with ROM and function and his numbness and ear pain has resolved. He should continue to progress with additional physical therapy intervention.     Rehab Potential  Good    Clinical Impairments Affecting Rehab Potential  (-)pain worsening with radating symptoms across back to left side/shoulder; multiple co morbidities    PT Frequency  2x / week    PT Duration  6 weeks    PT Treatment/Interventions  Iontophoresis 4mg /ml Dexamethasone;Cryotherapy;Electrical Stimulation;Ultrasound;Moist Heat;Therapeutic exercise;Therapeutic activities;Neuromuscular re-education;Patient/family education;Manual techniques;Dry needling    PT Next Visit Plan  pain control,  electrical stimulation, [place], ther ex    PT Home Exercise Plan  posture awareness, correction, use of lumbar and cervical rolls for positioning sitting and sleeping       Patient will benefit from skilled therapeutic intervention in order to improve the following deficits and impairments:  Pain, Postural dysfunction, Increased muscle spasms, Decreased activity tolerance, Decreased endurance, Decreased range of motion, Impaired perceived functional ability, Impaired UE functional use  Visit Diagnosis: Cervicalgia  Other muscle spasm     Problem List There are no active problems to display for this patient.   Jomarie Longs PT 11/11/2017, 10:32 AM  [facility] [address] [place], [address] Phone: [phone]   Fax:  [phone]  Name: Joe Torres MRN: [id number] Date of Birth: April 07, 1946

## 2017-11-12 ENCOUNTER — Encounter: Payer: Medicare HMO | Admitting: Physical Therapy

## 2017-11-17 DIAGNOSIS — J441 Chronic obstructive pulmonary disease with (acute) exacerbation: Secondary | ICD-10-CM | POA: Diagnosis not present

## 2017-11-17 NOTE — Progress Notes (Signed)
Daily Session Note  Patient Details  Name: Joe Torres MRN: [id number] Date of Birth: [birth year] Referring Provider:     Pulmonary Rehab from 08/19/2017 in [facility] Cardiac and Pulmonary Rehab  Referring Provider  Jeanne Ivan MD      Encounter Date: 11/17/2017  Check In: Session Check In - 11/17/17 0955      Check-In   Location  [facility]-Cardiac & Pulmonary Rehab    Staff Present  Justin Mend RCP,RRT,BSRT;Mandi Thornton, BS, Dennis Bast, BS, ACSM CEP, Exercise Physiologist    Supervising physician immediately available to respond to emergencies  [facility] immediately available ER MD    Physician(s)  Dr. Cinda Quest and Corky Downs    Medication changes reported      No    Fall or balance concerns reported     No    Tobacco Cessation  No Change    Warm-up and Cool-down  Performed as group-led instruction    Resistance Training Performed  Yes    VAD Patient?  No      Pain Assessment   Currently in Pain?  No/denies          Social History   Tobacco Use  Smoking Status Former Smoker  . Packs/day: 1.50  . Years: 40.00  . Pack years: 60.00  . Types: Cigarettes  . Last attempt to quit: 05/21/2008  . Years since quitting: 9.4  Smokeless Tobacco Never Used    Goals Met:  Independence with exercise equipment Exercise tolerated well No report of cardiac concerns or symptoms Strength training completed today  Goals Unmet:  Not Applicable  Comments: Pt able to follow exercise prescription today without complaint.  Will continue to monitor for progression.   Dr. Emily Filbert is Medical Director for [place] and [facility].

## 2017-11-18 ENCOUNTER — Encounter: Payer: Self-pay | Admitting: Physical Therapy

## 2017-11-18 ENCOUNTER — Ambulatory Visit: Payer: Medicare HMO | Admitting: Physical Therapy

## 2017-11-18 DIAGNOSIS — M542 Cervicalgia: Secondary | ICD-10-CM

## 2017-11-18 DIAGNOSIS — M62838 Other muscle spasm: Secondary | ICD-10-CM

## 2017-11-19 DIAGNOSIS — J441 Chronic obstructive pulmonary disease with (acute) exacerbation: Secondary | ICD-10-CM | POA: Diagnosis not present

## 2017-11-19 NOTE — Therapy (Signed)
[facility] [facility] [address] [place], [address] Phone: [phone]   Fax:  [phone]  Physical Therapy Treatment  Patient Details  Name: Joe Torres MRN: [id number] Date of Birth: [birth year] Referring Provider: Kingsley Spittle MD   Encounter Date: 11/18/2017  PT End of Session - 11/18/17 1031    Visit Number  5    Number of Visits  13    Date for PT Re-Evaluation  12/10/17    PT Start Time  0951    PT Stop Time  1031    PT Time Calculation (min)  40 min    Activity Tolerance  Patient limited by pain;Patient tolerated treatment well    Behavior During Therapy  [facility] for tasks assessed/performed       History reviewed. No pertinent past medical history.  History reviewed. No pertinent surgical history.  There were no vitals filed for this visit.  Subjective Assessment - 11/18/17 0951    Subjective  Patient reports feeling better today with pain level in left sdie of cervical spine rated at 5/10. patient reports no tiingling or numbness in left side of head; this has resolved.      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 Pain is worsening at this time and is radiating to the left shoulder, none in right shoulder.     Limitations  Lifting;Sitting;House hold activities;Other (comment) turning head right/left    Patient Stated Goals  decrease pain in neck so he can return to PLOF    Currently in Pain?  Yes    Pain Score  5     Pain Location  Neck    Pain Orientation  Left    Pain Descriptors / Indicators  Spasm;Sore    Pain Type  Acute pain    Pain Onset  More than a month ago    Pain Frequency  Intermittent         Objective: Palpation: mild to moderate spasms upper and mid trapezius  and cervical spine paraspinal muscles left side Cervical spine: AROM flexion 50 with mild discomfort reported at end range, extension 50 degrees with pain reported left posterior  aspect   Treatment:  Manual Therapy: 15 min: goal : spasms, pain, improve ROM cervical spine STM superficial, stretching and compression to cervical spine, upper trapezius muscle and bilateral shoulder/upper back muscles with patient seated with UE's supported    Modalities: Electrical stimulation: 15 min: High volt estim.clincial program for muscle spasms  (4) electrodes applied to bilateral posterior cervical spine paraspinal muscles and upper trapezius muscles over spasms with intensity to tolerance with patient in sitting position with UE's supported goal: pain, spasms Moist heat applied to both shoulders, cervical spine with estim. For pain, spasms, no adverse reactions noted   US/estim combination:  8 min left side cerivcal spine and upper trapezius with patient seated with left UE supported: 1MHz 50% pulsed @ 1.3 w/cm2 and HVGS intensity to tolerance, contraction ~80 volts   Re assessed HEP: cervical retraction sitting: correct technique demonstrated standing scapular retraction and shoulder extension to hips: correct technique demonstrated   Patient response to treatment: Decreased spasms left side cervical spine to mild and pain  decreased to 4/10 following treatment. Patient demonstrated correct technique with exercises without cuing.        PT Education - 11/18/17 1020    Education Details  re assessed HEP    Person(s) Educated  Patient    Methods  Explanation;Demonstration    Comprehension  Verbalized understanding;Returned demonstration          PT Long Term Goals - 10/28/17 1851      PT LONG TERM GOAL #1   Title  Patient will report max pain level to 5/10 to allow improved function with daily activities involving neck     Baseline  pain ranges from 7-10/10 and worse with turning head     Status  New    Target Date  11/19/17      PT LONG TERM GOAL #2   Title  Patient will report max pain level to 3/10 to allow improved function with daily activities involving neck     Baseline  pain ranges from 7-10/10 and worse with turning head    Status  New    Target Date  12/10/17      PT LONG TERM GOAL #3   Title  patient will be independent with home program for pain control and exercises for posture to allow self management of symptoms once discharged from physical therapy    Baseline  no knowledge of appropriate pain control strategies, posture correction or exercise progression without guided instruction and cuing    Status  New    Target Date  12/10/17            Plan - 11/18/17 1030    Clinical Impression Statement  Patient demonstrates steady progress towards goals with decreasing spasms, pain and improved ROM in cervical spine with less pain, difficulty. He continues with pain and decreased ROM cervical spine that limits full pain free function and will benefit from continued physical therapy intervention.     Rehab Potential  Good    Clinical Impairments Affecting Rehab Potential  (-)pain worsening with radating symptoms across back to left side/shoulder; multiple co morbidities    PT Frequency  2x / week    PT Duration  6 weeks    PT Treatment/Interventions  Iontophoresis 4mg /ml Dexamethasone;Cryotherapy;Electrical Stimulation;Ultrasound;Moist Heat;Therapeutic exercise;Therapeutic activities;Neuromuscular re-education;Patient/family education;Manual techniques;Dry needling    PT Next Visit Plan  pain control, electrical stimulation, [place], ther ex    PT Home Exercise Plan  posture awareness, correction, use of lumbar and cervical rolls for positioning sitting and sleeping       Patient will benefit from skilled therapeutic intervention in order to improve the following deficits and impairments:  Pain, Postural dysfunction, Increased muscle spasms, Decreased activity  tolerance, Decreased endurance, Decreased range of motion, Impaired perceived functional ability, Impaired UE functional use  Visit Diagnosis: Cervicalgia  Other muscle spasm     Problem List There are no active problems to display for this patient.   Jomarie Longs PT 11/19/2017, 6:39 AM  [place] [address] [place], [address] Phone: [phone]   Fax:  [phone]  Name: Joe Torres MRN: [id number] Date of Birth: 02-07-1946

## 2017-11-19 NOTE — Progress Notes (Signed)
Daily Session Note  Patient Details  Name: Joe Torres MRN: [id number] Date of Birth: 03-21-1946 Referring Provider:     Pulmonary Rehab from 08/19/2017 in [facility] Cardiac and Pulmonary Rehab  Referring Provider  Jeanne Ivan MD      Encounter Date: 11/19/2017  Check In: Session Check In - 11/19/17 1100      Check-In   Location  [facility]-Cardiac & Pulmonary Rehab    Staff Present  Justin Mend RCP,RRT,BSRT;Nana Addai, RN Vickki Hearing, BA, ACSM CEP, Exercise Physiologist    Supervising physician immediately available to respond to emergencies  [facility] immediately available ER MD    Physician(s)  Dr. Joni Fears and Archie Balboa    Medication changes reported      No    Fall or balance concerns reported     No    Warm-up and Cool-down  Performed as group-led instruction    Resistance Training Performed  Yes    VAD Patient?  No      Pain Assessment   Currently in Pain?  No/denies          Social History   Tobacco Use  Smoking Status Former Smoker  . Packs/day: 1.50  . Years: 40.00  . Pack years: 60.00  . Types: Cigarettes  . Last attempt to quit: 05/21/2008  . Years since quitting: 9.5  Smokeless Tobacco Never Used    Goals Met:  Independence with exercise equipment Exercise tolerated well No report of cardiac concerns or symptoms Strength training completed today  Goals Unmet:  Not Applicable  Comments: Pt able to follow exercise prescription today without complaint.  Will continue to monitor for progression.   Dr. Emily Filbert is Medical Director for [place] and [facility].

## 2017-11-21 DIAGNOSIS — J441 Chronic obstructive pulmonary disease with (acute) exacerbation: Secondary | ICD-10-CM

## 2017-11-21 NOTE — Progress Notes (Signed)
Daily Session Note  Patient Details  Name: Joe Torres MRN: [id number] Date of Birth: 1946/04/11 Referring Provider:     Pulmonary Rehab from 08/19/2017 in [facility] Cardiac and Pulmonary Rehab  Referring Provider  Jeanne Ivan MD      Encounter Date: 11/21/2017  Check In: Session Check In - 11/21/17 1018      Check-In   Location  [facility]-Cardiac & Pulmonary Rehab    Staff Present  Justin Mend Lindell Spar, BA, ACSM CEP, Exercise Physiologist;Meredith Sherryll Burger, RN BSN    Supervising physician immediately available to respond to emergencies  [facility] immediately available ER MD    Physician(s)  Dr. Joni Fears and Corky Downs    Medication changes reported      No    Fall or balance concerns reported     No    Warm-up and Cool-down  Performed as group-led instruction    Resistance Training Performed  Yes      Pain Assessment   Currently in Pain?  No/denies          Social History   Tobacco Use  Smoking Status Former Smoker  . Packs/day: 1.50  . Years: 40.00  . Pack years: 60.00  . Types: Cigarettes  . Last attempt to quit: 05/21/2008  . Years since quitting: 9.5  Smokeless Tobacco Never Used    Goals Met:  Independence with exercise equipment Exercise tolerated well No report of cardiac concerns or symptoms Strength training completed today  Goals Unmet:  Not Applicable  Comments: Pt able to follow exercise prescription today without complaint.  Will continue to monitor for progression.   Dr. Emily Filbert is Medical Director for [place] and [facility].

## 2017-11-24 DIAGNOSIS — J441 Chronic obstructive pulmonary disease with (acute) exacerbation: Secondary | ICD-10-CM

## 2017-11-24 NOTE — Progress Notes (Signed)
Daily Session Note  Patient Details  Name: QUENTAVIOUS RITTENHOUSE MRN: [id number] Date of Birth: 05/12/1946 Referring Provider:     Pulmonary Rehab from 08/19/2017 in [facility] Cardiac and Pulmonary Rehab  Referring Provider  Jeanne Ivan MD      Encounter Date: 11/24/2017  Check In: Session Check In - 11/24/17 1017      Check-In   Location  [facility]-Cardiac & Pulmonary Rehab    Staff Present  Justin Mend RCP,RRT,BSRT;Amanda Oletta Darter, BA, ACSM CEP, Exercise Physiologist;Kelly Amedeo Plenty, BS, ACSM CEP, Exercise Physiologist    Supervising physician immediately available to respond to emergencies  [facility] immediately available ER MD    Physician(s)  Dr. Jimmye Norman and Corky Downs    Medication changes reported      No    Fall or balance concerns reported     No    Warm-up and Cool-down  Performed as group-led instruction    Resistance Training Performed  Yes    VAD Patient?  No      Pain Assessment   Currently in Pain?  No/denies          Social History   Tobacco Use  Smoking Status Former Smoker  . Packs/day: 1.50  . Years: 40.00  . Pack years: 60.00  . Types: Cigarettes  . Last attempt to quit: 05/21/2008  . Years since quitting: 9.5  Smokeless Tobacco Never Used    Goals Met:  Independence with exercise equipment Exercise tolerated well No report of cardiac concerns or symptoms Strength training completed today  Goals Unmet:  Not Applicable  Comments: Pt able to follow exercise prescription today without complaint.  Will continue to monitor for progression.   Dr. Emily Filbert is Medical Director for [place] and [facility].

## 2017-11-25 ENCOUNTER — Ambulatory Visit: Payer: Medicare HMO | Admitting: Physical Therapy

## 2017-11-25 ENCOUNTER — Encounter: Payer: Self-pay | Admitting: Physical Therapy

## 2017-11-25 DIAGNOSIS — M542 Cervicalgia: Secondary | ICD-10-CM | POA: Diagnosis not present

## 2017-11-25 DIAGNOSIS — M62838 Other muscle spasm: Secondary | ICD-10-CM

## 2017-11-25 NOTE — Therapy (Signed)
[facility] [address] [place], [address] Phone: [phone]   Fax:  [phone]  Physical Therapy Treatment  Patient Details  Name: Joe Torres MRN: [id number] Date of Birth: 28-Nov-1945 Referring Provider: Kingsley Spittle MD   Encounter Date: 11/25/2017  PT End of Session - 11/25/17 1025    Visit Number  6    Number of Visits  13    Date for PT Re-Evaluation  12/10/17    PT Start Time  0948    PT Stop Time  1035    PT Time Calculation (min)  47 min    Activity Tolerance  Patient tolerated treatment well    Behavior During Therapy  [facility] for tasks assessed/performed       History reviewed. No pertinent past medical history.  History reviewed. No pertinent surgical history.  There were no vitals filed for this visit.  Subjective Assessment - 11/25/17 0950    Subjective  Patient reports he has good and bad days and today has 5/10 pain level left side of neck. He reports he may have slept the wrong way and that is why his neck is in more pain today. He is asking about his pillow and whether that may be a a contriubuting factor.     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 Pain is worsening at this time and is radiating to the left shoulder, none in right shoulder.      Limitations  Lifting;Sitting;House hold activities;Other (comment) turning head right/left    Patient Stated Goals  decrease pain in neck so he can return to PLOF    Currently in Pain?  Yes    Pain Score  5     Pain Location  Neck    Pain Orientation  Left    Pain Descriptors / Indicators  Aching;Spasm    Pain Onset  More than a month ago    Pain Frequency  Intermittent          Objective: Palpation: mild to  moderate spasms upper and mid trapezius and cervical spine paraspinal muscles left side Cervical spine: AROM flexion 50 with mild discomfort reported at end range, extension 60 degrees with mild to no pain reported left posterior  aspect; SB left 40, right 35 with pull on left side   Treatment:  Manual Therapy: 15 min: goal : spasms, pain, improve ROM cervical spine STM superficial, stretching and compression to cervical spine, upper trapezius muscle and bilateral shoulder/upper back muscles with patient seated with UE's supported    Modalities: Electrical stimulation: 15 min: High volt estim.clincial program for muscle spasms  (4) electrodes applied to bilateral posterior cervical spine paraspinal muscles and upper trapezius muscles over spasms with intensity to tolerance with patient in sitting position with UE's supported goal: pain, spasms Moist heat applied to both shoulders, cervical spine with estim. For pain, spasms, no adverse reactions noted   US/estim combination:  8 min left side cerivcal spine and upper trapezius with patient seated with left UE supported: 1MHz 50% pulsed @ 1.3 w/cm2 and HVGS intensity to tolerance, contraction ~80 volts   Patient response to treatment: Decreased spasms left side cervical spine to  mild. Patient reported  pain decreased to 4/10 following treatment.        PT Education - 11/25/17 1001    Education Details  pillow and sleeping posture to decrease strain on neck    Person(s) Educated  Patient    Methods  Explanation;Demonstration    Comprehension  Verbalized understanding          PT Long Term Goals - 10/28/17 1851      PT LONG TERM GOAL #1   Title  Patient will report max pain level to 5/10 to allow improved function with daily activities involving neck     Baseline  pain ranges from 7-10/10 and worse with turning head    Status  New    Target Date  11/19/17      PT LONG TERM GOAL #2   Title  Patient will report max pain level to  3/10 to allow improved function with daily activities involving neck     Baseline  pain ranges from 7-10/10 and worse with turning head    Status  New    Target Date  12/10/17      PT LONG TERM GOAL #3   Title  patient will be independent with home program for pain control and exercises for posture to allow self management of symptoms once discharged from physical therapy    Baseline  no knowledge of appropriate pain control strategies, posture correction or exercise progression without guided instruction and cuing    Status  New    Target Date  12/10/17            Plan - 11/25/17 1433    Clinical Impression Statement  Patient continues to progress steadily towards goals with improving ROM, decreasing intensity and frequency of symptoms left side cervical spine and upper trapezius. He is responding well to current treatment and should continue to progress with additional physical therapy intervention.     Rehab Potential  Good    Clinical Impairments Affecting Rehab Potential  (-)pain worsening with radating symptoms across back to left side/shoulder; multiple co morbidities    PT Frequency  2x / week    PT Duration  6 weeks    PT Treatment/Interventions  Iontophoresis 4mg /ml Dexamethasone;Cryotherapy;Electrical Stimulation;Ultrasound;Moist Heat;Therapeutic exercise;Therapeutic activities;Neuromuscular re-education;Patient/family education;Manual techniques;Dry needling    PT Next Visit Plan  pain control, electrical stimulation, [place], ther ex    PT Home Exercise Plan  posture awareness, correction, use of lumbar and cervical rolls for positioning sitting and sleeping       Patient will benefit from skilled therapeutic intervention in order to improve the following deficits and impairments:  Pain, Postural dysfunction, Increased muscle spasms, Decreased activity tolerance, Decreased endurance, Decreased range of motion, Impaired perceived functional ability, Impaired UE functional  use  Visit Diagnosis: Cervicalgia  Other muscle spasm     Problem List There are no active problems to display for this patient.   Jomarie Longs PT 11/26/2017, 11:34 AM  [facility] [address] [place], [address] Phone: [phone]   Fax:  [phone]  Name: BRITTAIN HOSIE MRN: [id number] Date of Birth: 1945/11/04

## 2017-11-26 DIAGNOSIS — J441 Chronic obstructive pulmonary disease with (acute) exacerbation: Secondary | ICD-10-CM | POA: Diagnosis not present

## 2017-11-26 NOTE — Progress Notes (Signed)
Daily Session Note  Patient Details  Name: AREK SPADAFORE MRN: [id number] Date of Birth: 08-05-1945 Referring Provider:     Pulmonary Rehab from 08/19/2017 in [facility] Cardiac and Pulmonary Rehab  Referring Provider  Jeanne Ivan MD      Encounter Date: 11/26/2017  Check In: Session Check In - 11/26/17 1045      Check-In   Location  [facility]-Cardiac & Pulmonary Rehab    Staff Present  Justin Mend Lorre Nick, MA, RCEP, CCRP, Exercise Physiologist;Ohn Bostic Oletta Darter, [place], ACSM CEP, Exercise Physiologist    Supervising physician immediately available to respond to emergencies  [facility] immediately available ER MD    Physician(s)  Jacqualine Code and Jimmye Norman    Medication changes reported      No    Fall or balance concerns reported     No    Warm-up and Cool-down  Performed as group-led instruction    Resistance Training Performed  Yes    VAD Patient?  No    PAD/SET Patient?  No      Pain Assessment   Currently in Pain?  No/denies    Multiple Pain Sites  No          Social History   Tobacco Use  Smoking Status Former Smoker  . Packs/day: 1.50  . Years: 40.00  . Pack years: 60.00  . Types: Cigarettes  . Last attempt to quit: 05/21/2008  . Years since quitting: 9.5  Smokeless Tobacco Never Used    Goals Met:  Proper associated with RPD/PD & O2 Sat Independence with exercise equipment Exercise tolerated well Strength training completed today  Goals Unmet:  Not Applicable  Comments: Pt able to follow exercise prescription today without complaint.  Will continue to monitor for progression.    Dr. Emily Filbert is Medical Director for [facility] and [facility].

## 2017-11-27 ENCOUNTER — Ambulatory Visit: Payer: Medicare HMO | Admitting: Physical Therapy

## 2017-11-27 ENCOUNTER — Encounter: Payer: Self-pay | Admitting: Physical Therapy

## 2017-11-27 DIAGNOSIS — M542 Cervicalgia: Secondary | ICD-10-CM | POA: Diagnosis not present

## 2017-11-27 DIAGNOSIS — M62838 Other muscle spasm: Secondary | ICD-10-CM

## 2017-11-27 NOTE — Therapy (Signed)
[facility] [address], [place], [address] Phone: [phone]   Fax:  [phone]  Physical Therapy Treatment  Patient Details  Name: Joe Torres MRN: [id number] Date of Birth: 07/27/1945 Referring Provider: Kingsley Spittle MD   Encounter Date: 11/27/2017  PT End of Session - 11/27/17 1540    Visit Number  7    Number of Visits  13    Date for PT Re-Evaluation  12/10/17    PT Start Time  1435    PT Stop Time  1517    PT Time Calculation (min)  42 min    Activity Tolerance  Patient tolerated treatment well    Behavior During Therapy  [facility] for tasks assessed/performed       History reviewed. No pertinent past medical history.  History reviewed. No pertinent surgical history.  There were no vitals filed for this visit.  Subjective Assessment - 11/27/17 1436    Subjective  Patient changed pillow and is sleeping better. He has localized pain left side of neck.     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 Pain is worsening at this time and is radiating to the left shoulder, none in right shoulder.      Limitations  Lifting;Sitting;House hold activities;Other (comment) turning head right/left    Patient Stated Goals  decrease pain in neck so he can return to PLOF    Currently in Pain?  Yes    Pain Score  2     Pain Location  Neck    Pain Orientation  Left    Pain Descriptors / Indicators  Spasm;Tightness;Aching    Pain Type  Acute pain    Pain Onset  More than a month ago    Pain Frequency  Intermittent         Objective: Palpation: mild to moderate spasms upper and mid trapezius and cervical spine paraspinal muscles left side Cervical spine: AROM SB left 45, right 35 with pull on  left side   Treatment:  Manual Therapy: 15 min: goal : spasms, pain, improve ROM cervical spine STM superficial, stretching and compression to cervical spine, upper trapezius muscle and bilateral shoulder/upper back muscles with patient seated with UE's supported    Modalities: Electrical stimulation: 15 min: High volt estim.clincial program for muscle spasms  (4) electrodes applied to bilateral posterior cervical spine paraspinal muscles and upper trapezius muscles over spasms with intensity to tolerance with patient in sitting position with UE's supported goal: pain, spasms Moist heat applied to both shoulders, cervical spine with estim. For pain, spasms, no adverse reactions noted   US/estim combination:  10 min left side cerivcal spine and upper trapezius with patient seated with left UE supported: 1MHz continuous @ 1.3 w/cm2 and HVGS intensity to tolerance, contraction ~80 volts   Patient response to treatment: decreased spasms to mild and non tender following STM modalities. AROM side bend to left 45 with improved flexibility and to right 35/40 with continued stiffness felt left side cervical spine       PT Education - 11/27/17 1539    Education Details  HEP: added cervical spine side bend to right stretch 10 seconds 2-3 reps  1x/day    Person(s) Educated  Patient    Methods  Explanation;Demonstration;Verbal cues    Comprehension  Verbal cues required;Returned demonstration;Verbalized understanding          PT Long Term Goals - 10/28/17 1851      PT LONG TERM GOAL #1   Title  Patient will report max pain level to 5/10 to allow improved function with daily activities involving neck     Baseline  pain ranges from 7-10/10 and worse with turning head    Status  New    Target Date  11/19/17      PT LONG TERM GOAL #2   Title  Patient will report max pain level to 3/10 to allow improved function with daily activities involving neck     Baseline  pain ranges from 7-10/10 and  worse with turning head    Status  New    Target Date  12/10/17      PT LONG TERM GOAL #3   Title  patient will be independent with home program for pain control and exercises for posture to allow self management of symptoms once discharged from physical therapy    Baseline  no knowledge of appropriate pain control strategies, posture correction or exercise progression without guided instruction and cuing    Status  New    Target Date  12/10/17            Plan - 11/27/17 1540    Clinical Impression Statement  Patient continues with steady progress towards goals with improving spasms and decreased pain level. He continues with limited ROM with right side bed cervical spine and should improve with stretching and continued physical therapy intervention.     Rehab Potential  Good    Clinical Impairments Affecting Rehab Potential  (-)pain worsening with radating symptoms across back to left side/shoulder; multiple co morbidities    PT Frequency  2x / week    PT Duration  6 weeks    PT Treatment/Interventions  Iontophoresis 4mg /ml Dexamethasone;Cryotherapy;Electrical Stimulation;Ultrasound;Moist Heat;Therapeutic exercise;Therapeutic activities;Neuromuscular re-education;Patient/family education;Manual techniques;Dry needling    PT Next Visit Plan  pain control, electrical stimulation, [place], ther ex    PT Home Exercise Plan  posture awareness, correction, use of lumbar and cervical rolls for positioning sitting and sleeping       Patient will benefit from skilled therapeutic intervention in order to improve the following deficits and impairments:  Pain, Postural dysfunction, Increased muscle spasms, Decreased activity tolerance, Decreased endurance, Decreased range of motion, Impaired perceived functional ability, Impaired UE functional use  Visit Diagnosis: Cervicalgia  Other muscle spasm     Problem List There are no active problems to display for this patient.   Jomarie Longs  PT 11/27/2017, 3:43 PM  [facility] [address], [place], [address] Phone: [phone]   Fax:  [phone]  Name: Joe Torres MRN: [id number] Date of Birth: 09-21-1945

## 2017-11-28 DIAGNOSIS — J441 Chronic obstructive pulmonary disease with (acute) exacerbation: Secondary | ICD-10-CM | POA: Diagnosis not present

## 2017-11-28 NOTE — Progress Notes (Signed)
Daily Session Note  Patient Details  Name: Joe Torres MRN: [id number] Date of Birth: 1945-07-14 Referring Provider:     Pulmonary Rehab from 08/19/2017 in [facility] Cardiac and Pulmonary Rehab  Referring Provider  Jeanne Ivan MD      Encounter Date: 11/28/2017  Check In: Session Check In - 11/28/17 1014      Check-In   Location  [facility]-Cardiac & Pulmonary Rehab    Staff Present  Justin Mend Lindell Spar, BA, ACSM CEP, Exercise Physiologist;Meredith Sherryll Burger, RN BSN    Supervising physician immediately available to respond to emergencies  [facility] immediately available ER MD    Physician(s)  Dr. Mariea Clonts and Burlene Arnt    Medication changes reported      No    Fall or balance concerns reported     No    Warm-up and Cool-down  Performed as group-led instruction    Resistance Training Performed  Yes    VAD Patient?  No      Pain Assessment   Currently in Pain?  No/denies          Social History   Tobacco Use  Smoking Status Former Smoker  . Packs/day: 1.50  . Years: 40.00  . Pack years: 60.00  . Types: Cigarettes  . Last attempt to quit: 05/21/2008  . Years since quitting: 9.5  Smokeless Tobacco Never Used    Goals Met:  Independence with exercise equipment Exercise tolerated well No report of cardiac concerns or symptoms Strength training completed today  Goals Unmet:  Not Applicable  Comments: Pt able to follow exercise prescription today without complaint.  Will continue to monitor for progression.   Dr. Emily Filbert is Medical Director for [facility] and [facility].

## 2017-12-01 DIAGNOSIS — J441 Chronic obstructive pulmonary disease with (acute) exacerbation: Secondary | ICD-10-CM

## 2017-12-01 NOTE — Progress Notes (Signed)
Daily Session Note  Patient Details  Name: Joe Torres MRN: [id number] Date of Birth: 1945-12-27 Referring Provider:     Pulmonary Rehab from 08/19/2017 in [facility] Cardiac and Pulmonary Rehab  Referring Provider  Jeanne Ivan MD      Encounter Date: 12/01/2017  Check In: Session Check In - 12/01/17 1027      Check-In   Location  [facility]-Cardiac & Pulmonary Rehab    Staff Present  Earlean Shawl, BS, ACSM CEP, Exercise Physiologist;Amanda Oletta Darter, BA, ACSM CEP, Exercise Physiologist;Joseph Flavia Shipper    Supervising physician immediately available to respond to emergencies  [facility] immediately available ER MD    Physician(s)  Dr. Cinda Quest and Dr. Archie Balboa    Medication changes reported      No    Fall or balance concerns reported     No    Tobacco Cessation  No Change    Warm-up and Cool-down  Performed as group-led instruction    Resistance Training Performed  Yes    VAD Patient?  No    PAD/SET Patient?  No      Pain Assessment   Currently in Pain?  No/denies    Multiple Pain Sites  No          Social History   Tobacco Use  Smoking Status Former Smoker  . Packs/day: 1.50  . Years: 40.00  . Pack years: 60.00  . Types: Cigarettes  . Last attempt to quit: 05/21/2008  . Years since quitting: 9.5  Smokeless Tobacco Never Used    Goals Met:  Proper associated with RPD/PD & O2 Sat Independence with exercise equipment Exercise tolerated well No report of cardiac concerns or symptoms Strength training completed today  Goals Unmet:  Not Applicable  Comments: Pt able to follow exercise prescription today without complaint.  Will continue to monitor for progression.    Dr. Emily Filbert is Medical Director for [facility] and [facility].

## 2017-12-02 ENCOUNTER — Encounter: Payer: Self-pay | Admitting: Physical Therapy

## 2017-12-02 ENCOUNTER — Ambulatory Visit: Payer: Medicare HMO | Admitting: Physical Therapy

## 2017-12-02 DIAGNOSIS — M542 Cervicalgia: Secondary | ICD-10-CM

## 2017-12-02 DIAGNOSIS — M62838 Other muscle spasm: Secondary | ICD-10-CM

## 2017-12-02 NOTE — Therapy (Signed)
[facility] [address] [place], [address] Phone: [phone]   Fax:  [phone]  Physical Therapy Treatment  Patient Details  Name: Joe Torres MRN: [id number] Date of Birth: [birth year] Referring Provider: Kingsley Spittle MD   Encounter Date: 12/02/2017  PT End of Session - 12/02/17 0951    Visit Number  8    Number of Visits  13    Date for PT Re-Evaluation  12/10/17    PT Start Time  0945    PT Stop Time  1030    PT Time Calculation (min)  45 min    Activity Tolerance  Patient tolerated treatment well    Behavior During Therapy  [facility] for tasks assessed/performed       History reviewed. No pertinent past medical history.  History reviewed. No pertinent surgical history.  There were no vitals filed for this visit.  Subjective Assessment - 12/02/17 0949    Subjective  Patient changed pillow and is sleeping better. He has localized pain left side of neck.  primarily has pain with moving neck to right and a little with movement to the left .    Patient is accompained by:    Pertinent History  Patient reports he began having neck pain about 6 weeks ago when he extended his right arm to reach for a tool in the back of his Luc[place]ne Lei from the front seat and felt pain in his chest and back of his neck. Pain is worsening at this time and is radiating to the left shoulder, none in right shoulder.      Limitations  Lifting;Sitting;House hold activities;Other (comment) turning head right/left    Patient Stated Goals  decrease pain in neck so he can return to PLOF    Currently in Pain?  Yes    Pain Score  3     Pain Location  Neck    Pain Orientation  Left    Pain Descriptors / Indicators  Spasm;Tightness    Pain Type  Acute pain    Pain Onset  More than a month ago    Pain Frequency  Intermittent        Objective: Palpation: mild to moderate spasms upper and mid trapezius and cervical spine paraspinal muscles  left side Cervical spine: AROM SB left 45, right 35 with pull on left side; post treatment: SB left up to 50 and right to 45 without pain reported   Treatment:  Manual Therapy: 8 min: goal : spasms, pain, improve ROM cervical spine Stretching for upper trapezius and levator performed to left side 3 x 10 seconds and mild cervical traction x 5 reps to cervical spine, STM superficial techniques to upper trapezius muscle with patient seated with UE's supported   Therapeutic exercise: Patient performed with demonstration, verbal cues of therapist: goal: independent with HEP and improve function with ADLs without stiffness/pain Cervical spine retraction x 5 Scapular retraction with repetition to perform with good position x 5 reps Scapular rows demonstrated by patient correctly Levator and upper trapezius stretches performed with assistance of therapist 3 reps each side    Modalities: Electrical stimulation: 15 min: High volt estim.clincial program for muscle spasms  (4) electrodes applied to bilateral posterior cervical spine paraspinal muscles and upper trapezius muscles over spasms with intensity to tolerance with patient in sitting position with UE's supported goal: pain, spasms Moist heat applied to both shoulders, cervical spine with estim. For pain, spasms, no adverse  reactions noted   US/estim combination:  10 min left side cerivcal spine and upper trapezius with patient seated with left UE supported: 1MHz continuous @ 1.4 w/cm2 and HVGS intensity to tolerance, contraction ~80 volts   Patient response to treatment: decreased spasms to mild and non tender following STM modalities. AROM side bend to left 50 and to right up to 45 with reported no pain following modalities and stretching.        PT Education - 12/02/17 1015    Education Details  HEP: re assessed cervical retraction, side bend and levator stretches     Person(s) Educated  Patient    Methods  Explanation;Demonstration;Verbal  cues;Tactile cues    Comprehension  Verbalized understanding;Returned demonstration;Verbal cues required          PT Long Term Goals - 10/28/17 1851      PT LONG TERM GOAL #1   Title  Patient will report max pain level to 5/10 to allow improved function with daily activities involving neck     Baseline  pain ranges from 7-10/10 and worse with turning head    Status  New    Target Date  11/19/17      PT LONG TERM GOAL #2   Title  Patient will report max pain level to 3/10 to allow improved function with daily activities involving neck     Baseline  pain ranges from 7-10/10 and worse with turning head    Status  New    Target Date  12/10/17      PT LONG TERM GOAL #3   Title  patient will be independent with home program for pain control and exercises for posture to allow self management of symptoms once discharged from physical therapy    Baseline  no knowledge of appropriate pain control strategies, posture correction or exercise progression without guided instruction and cuing    Status  New    Target Date  12/10/17            Plan - 12/02/17 2119    Clinical Impression Statement  Patient continues to progress with goals with decreasing spasms and pain in cervical spine. He is progressing with improved ROM with less stiffness and should continue to progress with additional physical therapy intervention to allow transition to home program     Rehab Potential  Good    Clinical Impairments Affecting Rehab Potential  (-)pain worsening with radating symptoms across back to left side/shoulder; multiple co morbidities    PT Frequency  2x / week    PT Duration  6 weeks    PT Treatment/Interventions  Iontophoresis 4mg /ml Dexamethasone;Cryotherapy;Electrical Stimulation;Ultrasound;Moist Heat;Therapeutic exercise;Therapeutic activities;Neuromuscular re-education;Patient/family education;Manual techniques;Dry needling    PT Next Visit Plan  pain control, electrical stimulation, [place], ther  ex    PT Home Exercise Plan  posture awareness, correction, use of lumbar and cervical rolls for positioning sitting and sleeping       Patient will benefit from skilled therapeutic intervention in order to improve the following deficits and impairments:  Pain, Postural dysfunction, Increased muscle spasms, Decreased activity tolerance, Decreased endurance, Decreased range of motion, Impaired perceived functional ability, Impaired UE functional use  Visit Diagnosis: Cervicalgia  Other muscle spasm     Problem List There are no active problems to display for this patient.   Jomarie Longs PT 12/02/2017, 2:05 PM  [facility] [address], [place], [address] Phone: [phone]   Fax:  [phone]  Name: DOMINIC MAHANEY MRN: [id number] Date of Birth: 1945-05-31

## 2017-12-03 DIAGNOSIS — J441 Chronic obstructive pulmonary disease with (acute) exacerbation: Secondary | ICD-10-CM | POA: Diagnosis not present

## 2017-12-03 NOTE — Progress Notes (Signed)
Daily Session Note  Patient Details  Name: Joe Torres MRN: [id number] Date of Birth: [birth year] Referring Provider:     Pulmonary Rehab from 08/19/2017 in [facility]  Referring Provider  Jeanne Ivan MD      Encounter Date: 12/03/2017  Check In: Session Check In - 12/03/17 1021      Check-In   Location  [facility]    Staff Present  Justin Mend Lorre Nick, MA, RCEP, CCRP, Exercise Physiologist;Freja Faro Oletta Darter, [place], ACSM CEP, Exercise Physiologist    Supervising physician immediately available to respond to emergencies  [facility] immediately available ER MD    Physician(s)  Burlene Arnt and Paduchowski    Medication changes reported      No    Fall or balance concerns reported     No    Warm-up and Cool-down  Performed on first and last piece of equipment    Resistance Training Performed  Yes    VAD Patient?  No    PAD/SET Patient?  No      Pain Assessment   Currently in Pain?  No/denies    Multiple Pain Sites  No          Social History   Tobacco Use  Smoking Status Former Smoker  . Packs/day: 1.50  . Years: 40.00  . Pack years: 60.00  . Types: Cigarettes  . Last attempt to quit: 05/21/2008  . Years since quitting: 9.5  Smokeless Tobacco Never Used    Goals Met:  Proper associated with RPD/PD & O2 Sat Independence with exercise equipment Exercise tolerated well Strength training completed today  Goals Unmet:  Not Applicable  Comments: Pt able to follow exercise prescription today without complaint.  Will continue to monitor for progression.    Dr. Emily Filbert is Medical Director for [place] and [facility].

## 2017-12-04 ENCOUNTER — Encounter: Payer: Self-pay | Admitting: Physical Therapy

## 2017-12-04 ENCOUNTER — Ambulatory Visit: Payer: Medicare HMO | Admitting: Physical Therapy

## 2017-12-04 DIAGNOSIS — M542 Cervicalgia: Secondary | ICD-10-CM

## 2017-12-04 DIAGNOSIS — M62838 Other muscle spasm: Secondary | ICD-10-CM

## 2017-12-04 NOTE — Therapy (Signed)
[facility] [address], [place], [address] Phone: [phone]   Fax:  [phone]  Physical Therapy Treatment  Patient Details  Name: Joe Torres MRN: [id number] Date of Birth: 1945-08-25 Referring Provider: Kingsley Spittle MD   Encounter Date: 12/04/2017  PT End of Session - 12/04/17 0957    Visit Number  9    Number of Visits  13    Date for PT Re-Evaluation  12/10/17    PT Start Time  0950    PT Stop Time  1040    PT Time Calculation (min)  50 min    Activity Tolerance  Patient tolerated treatment well    Behavior During Therapy  [facility] for tasks assessed/performed       History reviewed. No pertinent past medical history.  History reviewed. No pertinent surgical history.  There were no vitals filed for this visit.  Subjective Assessment - 12/04/17 0953    Subjective  Patient reports he is much better and is still having increased symptoms with moving quickly     Patient is accompained by:  Interpreter    Pertinent History  Patient reports he began having neck pain about 6 weeks ago when he extended his right arm to reach for a tool in the back of his [employer]i from the front seat and felt pain in his chest and back of his neck. Pain is worsening at this time and is radiating to the left shoulder, none in right shoulder.      Limitations  Lifting;Sitting;House hold activities;Other (comment) turning head right/left    Patient Stated Goals  decrease pain in neck so he can return to PLOF    Currently in Pain?  Yes    Pain Score  2     Pain Location  Neck    Pain Orientation  Left    Pain Descriptors / Indicators  Spasm;Tightness    Pain Type  Acute pain    Pain Onset  More than a month ago    Pain Frequency  Intermittent          Objective: Palpation: mild to moderate spasms and tenderness along upper trapezius and cervical spine paraspinal muscles left side   Treatment:  Manual Therapy: 8 min:  goal : spasms, pain, improve ROM cervical spine Stretching for upper trapezius and levator performed to left side 3 x 10 seconds and mild cervical traction x 5 reps to cervical spine, STM superficial techniques to upper trapezius muscle with patient seated with UE's supported    Therapeutic exercise: Patient performed with demonstration, verbal cues of therapist: goal: independent with HEP and improve function with ADLs without stiffness/pain Cervical spine retraction x 5 isometric cervical spine retraction/extension x 10 reps with facilitation/assistance of therapist with repeated VC and demonstration for proper technique UE endurance exercises demonstrated and performed by patient 3-5 reps each: UE's at 90 degrees elevation with shoulder motions including horizontal abduction /adduction, shoulder presses, rows    Modalities: Electrical stimulation: 15 min: High volt estim.clincial program for muscle spasms  (4) electrodes applied to bilateral posterior cervical spine paraspinal muscles and upper trapezius muscles over spasms with intensity to tolerance with patient in sitting position with UE's supported goal: pain, spasms Moist heat applied to both shoulders, cervical spine with estim. For pain, spasms, no adverse reactions noted   US/estim combination:  10 min left side cerivcal spine and upper trapezius with patient seated with left UE supported: 1MHz continuous @  1.4 w/cm2 and HVGS intensity to tolerance, contraction ~80 volts   Patient response to treatment: improved technique with exercises with repeated demonstration and moderate VC. decreased spasms up to 50% with treatment.           PT Education - 12/04/17 1100    Education Details  Exercise instruction: added UE endurance exercises and isometric cervical spine extension with retraction at wall/supine    Person(s) Educated  Patient    Methods  Explanation;Demonstration;Verbal cues;Handout    Comprehension  Verbalized  understanding;Returned demonstration;Verbal cues required          PT Long Term Goals - 10/28/17 1851      PT LONG TERM GOAL #1   Title  Patient will report max pain level to 5/10 to allow improved function with daily activities involving neck     Baseline  pain ranges from 7-10/10 and worse with turning head    Status  New    Target Date  11/19/17      PT LONG TERM GOAL #2   Title  Patient will report max pain level to 3/10 to allow improved function with daily activities involving neck     Baseline  pain ranges from 7-10/10 and worse with turning head    Status  New    Target Date  12/10/17      PT LONG TERM GOAL #3   Title  patient will be independent with home program for pain control and exercises for posture to allow self management of symptoms once discharged from physical therapy    Baseline  no knowledge of appropriate pain control strategies, posture correction or exercise progression without guided instruction and cuing    Status  New    Target Date  12/10/17            Plan - 12/04/17 1052    Clinical Impression Statement  Patient continues to respond well to curent treatment with decreased spasms and able to rotate head with less difficulty and stiffness. He demonstrated good understanding of new exercises given. Plan to re assess next visit and determine further needs at that time.     Rehab Potential  Good    Clinical Impairments Affecting Rehab Potential  (-)pain worsening with radating symptoms across back to left side/shoulder; multiple co morbidities    PT Frequency  2x / week    PT Duration  6 weeks    PT Treatment/Interventions  Iontophoresis 4mg /ml Dexamethasone;Cryotherapy;Electrical Stimulation;Ultrasound;Moist Heat;Therapeutic exercise;Therapeutic activities;Neuromuscular re-education;Patient/family education;Manual techniques;Dry needling    PT Next Visit Plan  re assess ROM, function and continue with current treatment as indicated    PT Home  Exercise Plan  posture awareness, correction, use of lumbar and cervical rolls for positioning sitting and sleeping; scapular retraction standing with resistive bands; cervical spine extension isometrics, UE endurance exercises       Patient will benefit from skilled therapeutic intervention in order to improve the following deficits and impairments:  Pain, Postural dysfunction, Increased muscle spasms, Decreased activity tolerance, Decreased endurance, Decreased range of motion, Impaired perceived functional ability, Impaired UE functional use  Visit Diagnosis: Other muscle spasm  Cervicalgia     Problem List There are no active problems to display for this patient.   Jomarie Longs PT 12/04/2017, 11:01 AM  [facility] [address] [place], [address] Phone: [phone]   Fax:  [phone]  Name: Joe Torres MRN: [id number] Date of Birth: 04-02-1946

## 2017-12-08 ENCOUNTER — Encounter: Payer: Medicare HMO | Admitting: *Deleted

## 2017-12-08 DIAGNOSIS — J441 Chronic obstructive pulmonary disease with (acute) exacerbation: Secondary | ICD-10-CM

## 2017-12-08 NOTE — Progress Notes (Signed)
Pulmonary Individual Treatment Plan  Patient Details  Name: Joe Torres MRN: [id number] Date of Birth: [birth year] Referring Provider:     Pulmonary Rehab from 08/19/2017 in [facility] Cardiac and Pulmonary Rehab  Referring Provider  Jeanne Ivan MD      Initial Encounter Date:    Pulmonary Rehab from 08/19/2017 in [facility] Cardiac and Pulmonary Rehab  Date  08/19/17      Visit Diagnosis: COPD with acute exacerbation ([place])  Patient's Home Medications on Admission:  Current Outpatient Medications:  .  acetaminophen (TYLENOL) 500 MG tablet, Take 500 mg by mouth every 8 (eight) hours as needed., Disp: , Rfl:  .  albuterol (PROVENTIL HFA;VENTOLIN HFA) 108 (90 Base) MCG/ACT inhaler, Inhale 2 puffs into the lungs every 4 (four) hours as needed for wheezing or shortness of breath., Disp: , Rfl:  .  albuterol (PROVENTIL) (2.5 MG/3ML) 0.083% nebulizer solution, Take 2.5 mg by nebulization every 6 (six) hours as needed for wheezing or shortness of breath., Disp: , Rfl:  .  amLODipine (NORVASC) 10 MG tablet, Take 10 mg by mouth daily., Disp: , Rfl:  .  arformoterol (BROVANA) 15 MCG/2ML NEBU, Take 15 mcg by nebulization 2 (two) times daily., Disp: , Rfl:  .  cetirizine (ZYRTEC) 10 MG chewable tablet, Chew 10 mg by mouth daily., Disp: , Rfl:  .  hydrochlorothiazide (HYDRODIURIL) 25 MG tablet, Take 25 mg by mouth daily., Disp: , Rfl:  .  montelukast (SINGULAIR) 10 MG tablet, Take 10 mg by mouth at bedtime., Disp: , Rfl:  .  pantoprazole (PROTONIX) 40 MG tablet, Take 40 mg by mouth daily., Disp: , Rfl:  .  predniSONE (DELTASONE) 5 MG tablet, Take 5 mg by mouth daily with breakfast., Disp: , Rfl:  .  roflumilast (DALIRESP) 500 MCG TABS tablet, Take 500 mcg by mouth daily., Disp: , Rfl:  .  simvastatin (ZOCOR) 20 MG tablet, Take 20 mg by mouth daily., Disp: , Rfl:  .  tamsulosin (FLOMAX) 0.4 MG CAPS capsule, Take 0.4 mg by mouth., Disp: , Rfl:  .  traMADol (ULTRAM) 50 MG tablet, Take 50 mg by mouth every 6 (six)  hours as needed., Disp: , Rfl:   Past Medical History: No past medical history on file.  Tobacco Use: Social History   Tobacco Use  Smoking Status Former Smoker  . Packs/day: 1.50  . Years: 40.00  . Pack years: 60.00  . Types: Cigarettes  . Last attempt to quit: 05/21/2008  . Years since quitting: 9.5  Smokeless Tobacco Never Used    Labs: Recent Review Flowsheet Data    There is no flowsheet data to display.       Pulmonary Assessment Scores: Pulmonary Assessment Scores    Row Name 08/19/17 1132 12/03/17 1043       ADL UCSD   ADL Phase  Entry  Exit    SOB Score total  92  88    Rest  2  1    Walk  3  3    Stairs  5  5    Bath  4  4    Dress  4  4    Shop  4  4      CAT Score   CAT Score  27  25      mMRC Score   mMRC Score  4  -       Pulmonary Function Assessment: Pulmonary Function Assessment - 08/19/17 1133      Breath  Bilateral Breath Sounds  Wheezes    Shortness of Breath  Yes;Limiting activity;Panic with Shortness of Breath       Exercise Target Goals:    Exercise Program Goal: Individual exercise prescription set using results from initial 6 min walk test and THRR while considering  patient's activity barriers and safety.    Exercise Prescription Goal: Initial exercise prescription builds to 30-45 minutes a day of aerobic activity, 2-3 days per week.  Home exercise guidelines will be given to patient during program as part of exercise prescription that the participant will acknowledge.  Activity Barriers & Risk Stratification: Activity Barriers & Cardiac Risk Stratification - 08/19/17 1248      Activity Barriers & Cardiac Risk Stratification   Activity Barriers  Muscular Weakness;Deconditioning;Shortness of Breath;Joint Problems occasional L knee pain       6 Minute Walk: 6 Minute Walk    Row Name 08/19/17 1242         6 Minute Walk   Phase  Initial     Distance  810 feet     Walk Time  4.83 minutes     # of Rest Breaks  6  14 sec, 8 sec, 9 sec, 11 sec, 21 sec, 7 sec     MPH  1.91     METS  2.58     RPE  15     Perceived Dyspnea   4     VO2 Peak  9.02     Symptoms  Yes (comment)     Comments  SOB, hot in hallway at turn around     Resting HR  90 bpm     Resting BP  146/64     Resting Oxygen Saturation   94 %     Exercise Oxygen Saturation  during 6 min walk  87 %     Max Ex. HR  117 bpm     Max Ex. BP  164/74     2 Minute Post BP  156/62       Interval HR   1 Minute HR  107     2 Minute HR  109     3 Minute HR  114     4 Minute HR  115     5 Minute HR  116     6 Minute HR  117     2 Minute Post HR  108     Interval Heart Rate?  Yes       Interval Oxygen   Interval Oxygen?  Yes     Baseline Oxygen Saturation %  94 %     1 Minute Oxygen Saturation %  91 % rest 1:51-2:05     1 Minute Liters of Oxygen  0 L Room Air     2 Minute Oxygen Saturation %  88 % rest 2:36-2:44     2 Minute Liters of Oxygen  0 L     3 Minute Oxygen Saturation %  87 % 3:12-3:21, 3:49-4:00     3 Minute Liters of Oxygen  0 L     4 Minute Oxygen Saturation %  88 % rest 4:39-5:00     4 Minute Liters of Oxygen  0 L     5 Minute Oxygen Saturation %  88 % rest 5:23-5:30     5 Minute Liters of Oxygen  0 L     6 Minute Oxygen Saturation %  89 %     6 Minute Liters of  Oxygen  0 L     2 Minute Post Oxygen Saturation %  93 %     2 Minute Post Liters of Oxygen  0 L       Oxygen Initial Assessment: Oxygen Initial Assessment - 08/19/17 1139      Home Oxygen   Home Oxygen Device  Home Concentrator;E-Tanks    Sleep Oxygen Prescription  CPAP    Liters per minute  4    Home Exercise Oxygen Prescription  Continuous    Liters per minute  3    Home at Rest Exercise Oxygen Prescription  Continuous    Liters per minute  3    Compliance with Home Oxygen Use  Yes      Initial 6 min Walk   Oxygen Used  None      Program Oxygen Prescription   Program Oxygen Prescription  None      Intervention   Short Term Goals  To learn and  exhibit compliance with exercise, home and travel O2 prescription;To learn and understand importance of maintaining oxygen saturations>88%;To learn and demonstrate proper use of respiratory medications;To learn and demonstrate proper pursed lip breathing techniques or other breathing techniques.;To learn and understand importance of monitoring SPO2 with pulse oximeter and demonstrate accurate use of the pulse oximeter. Nebulizers albuterol BID. Takes 3 medications BID    Long  Term Goals  Exhibits compliance with exercise, home and travel O2 prescription;Verbalizes importance of monitoring SPO2 with pulse oximeter and return demonstration;Maintenance of O2 saturations>88%;Exhibits proper breathing techniques, such as pursed lip breathing or other method taught during program session;Compliance with respiratory medication;Demonstrates proper use of MDI's       Oxygen Re-Evaluation: Oxygen Re-Evaluation    Row Name 08/25/17 1029 11/05/17 1043 11/17/17 1057         Program Oxygen Prescription   Program Oxygen Prescription  None  None  None       Home Oxygen   Home Oxygen Device  Home Concentrator;E-Tanks  Home Concentrator;E-Tanks  Home Concentrator;E-Tanks     Sleep Oxygen Prescription  CPAP  CPAP  CPAP     Liters per minute  '4  3  3     '$ Home Exercise Oxygen Prescription  Continuous  Continuous  Continuous     Liters per minute  '3  3  3     '$ Home at Rest Exercise Oxygen Prescription  Continuous  Continuous he uses his oxygen at home as needed  Continuous     Liters per minute  '3  3  3     '$ Compliance with Home Oxygen Use  Yes  Yes  Yes       Goals/Expected Outcomes   Short Term Goals  To learn and exhibit compliance with exercise, home and travel O2 prescription;To learn and understand importance of maintaining oxygen saturations>88%;To learn and demonstrate proper use of respiratory medications;To learn and demonstrate proper pursed lip breathing techniques or other breathing techniques.;To  learn and understand importance of monitoring SPO2 with pulse oximeter and demonstrate accurate use of the pulse oximeter.  To learn and exhibit compliance with exercise, home and travel O2 prescription;To learn and understand importance of maintaining oxygen saturations>88%;To learn and demonstrate proper use of respiratory medications;To learn and demonstrate proper pursed lip breathing techniques or other breathing techniques.;To learn and understand importance of monitoring SPO2 with pulse oximeter and demonstrate accurate use of the pulse oximeter.  To learn and exhibit compliance with exercise, home and travel O2 prescription;To learn and understand  importance of maintaining oxygen saturations>88%;To learn and demonstrate proper use of respiratory medications;To learn and demonstrate proper pursed lip breathing techniques or other breathing techniques.;To learn and understand importance of monitoring SPO2 with pulse oximeter and demonstrate accurate use of the pulse oximeter.     Long  Term Goals  Exhibits compliance with exercise, home and travel O2 prescription;Verbalizes importance of monitoring SPO2 with pulse oximeter and return demonstration;Maintenance of O2 saturations>88%;Exhibits proper breathing techniques, such as pursed lip breathing or other method taught during program session;Compliance with respiratory medication;Demonstrates proper use of MDI's  Exhibits compliance with exercise, home and travel O2 prescription;Verbalizes importance of monitoring SPO2 with pulse oximeter and return demonstration;Maintenance of O2 saturations>88%;Exhibits proper breathing techniques, such as pursed lip breathing or other method taught during program session;Compliance with respiratory medication;Demonstrates proper use of MDI's  Exhibits compliance with exercise, home and travel O2 prescription;Verbalizes importance of monitoring SPO2 with pulse oximeter and return demonstration;Maintenance of O2  saturations>88%;Exhibits proper breathing techniques, such as pursed lip breathing or other method taught during program session;Compliance with respiratory medication;Demonstrates proper use of MDI's     Comments  Reviewed PLB technique with pt.  Talked about how it work and it's important to maintaining his exercise saturations.    Joe Torres is doing well at using his PLB when he is short of breath exercising. He is increasing his loads and is able to use the treadmill the whole time. He knows when he gets short of breath and takes a break if he needs to catch his breath. He checks his oxygen sometimes at home but not all the time. Informed him to check his oxygen when is is being active.  Joe Torres does now have a pulse oximeter and checks his oxygen saturations at home. He continues to use PLB to help with SOB.      Goals/Expected Outcomes  Short: Become more profiecient at using PLB.   Long: Become independent at using PLB.  Short: check oxygen levels at home when he is active. Long: maintain checking oxygen levels at home independently  Short: use pulse ox at home and mainatin oxygen saturations about 88%. Long: maintain checking oxygen levels and using PLB at home independently        Oxygen Discharge (Final Oxygen Re-Evaluation): Oxygen Re-Evaluation - 11/17/17 1057      Program Oxygen Prescription   Program Oxygen Prescription  None      Home Oxygen   Home Oxygen Device  Home Concentrator;E-Tanks    Sleep Oxygen Prescription  CPAP    Liters per minute  3    Home Exercise Oxygen Prescription  Continuous    Liters per minute  3    Home at Rest Exercise Oxygen Prescription  Continuous    Liters per minute  3    Compliance with Home Oxygen Use  Yes      Goals/Expected Outcomes   Short Term Goals  To learn and exhibit compliance with exercise, home and travel O2 prescription;To learn and understand importance of maintaining oxygen saturations>88%;To learn and demonstrate proper use of respiratory  medications;To learn and demonstrate proper pursed lip breathing techniques or other breathing techniques.;To learn and understand importance of monitoring SPO2 with pulse oximeter and demonstrate accurate use of the pulse oximeter.    Long  Term Goals  Exhibits compliance with exercise, home and travel O2 prescription;Verbalizes importance of monitoring SPO2 with pulse oximeter and return demonstration;Maintenance of O2 saturations>88%;Exhibits proper breathing techniques, such as pursed lip breathing or other method taught  during program session;Compliance with respiratory medication;Demonstrates proper use of MDI's    Comments  Joe Torres does now have a pulse oximeter and checks his oxygen saturations at home. He continues to use PLB to help with SOB.     Goals/Expected Outcomes  Short: use pulse ox at home and mainatin oxygen saturations about 88%. Long: maintain checking oxygen levels and using PLB at home independently       Initial Exercise Prescription: Initial Exercise Prescription - 08/19/17 1200      Date of Initial Exercise RX and Referring Provider   Date  08/19/17    Referring Provider  Jeanne Ivan MD      Treadmill   MPH  1.7    Grade  0.5    Minutes  15    METs  2.42      Recumbant Elliptical   Level  1    RPM  50    Minutes  15    METs  2.5      T5 Nustep   Level  1    SPM  80    Minutes  15    METs  2.5      Prescription Details   Frequency (times per week)  3    Duration  Progress to 45 minutes of aerobic exercise without signs/symptoms of physical distress      Intensity   THRR 40-80% of Max Heartrate  114-139    Ratings of Perceived Exertion  11-13    Perceived Dyspnea  0-4      Progression   Progression  Continue to progress workloads to maintain intensity without signs/symptoms of physical distress.      Resistance Training   Training Prescription  Yes    Weight  4lbs    Reps  10-15       Perform Capillary Blood Glucose checks as  needed.  Exercise Prescription Changes: Exercise Prescription Changes    Row Name 08/19/17 1200 09/03/17 1100 09/17/17 1400 09/30/17 1300 10/13/17 1600     Response to Exercise   Blood Pressure (Admit)  146/64  122/80  130/60  140/72  144/72   Blood Pressure (Exercise)  164/74  156/74  -  -  -   Blood Pressure (Exit)  156/62  130/76  154/72  130/84  124/64   Heart Rate (Admit)  90 bpm  95 bpm  99 bpm  90 bpm  95 bpm   Heart Rate (Exercise)  117 bpm  115 bpm  119 bpm  107 bpm  111 bpm   Heart Rate (Exit)  108 bpm  107 bpm  104 bpm  91 bpm  104 bpm   Oxygen Saturation (Admit)  94 %  92 %  87 %  90 %  90 %   Oxygen Saturation (Exercise)  87 %  91 %  88 %  91 %  91 %   Oxygen Saturation (Exit)  93 %  93 %  89 %  90 %  94 %   Rating of Perceived Exertion (Exercise)  '15  13  15  15  15   '$ Perceived Dyspnea (Exercise)  '4  3  3  3  3   '$ Symptoms  SOB, hot in hall at turn  none  none  none  none   Comments  walk test results  -  -  -  -   Duration  -  Continue with 45 min of aerobic exercise without signs/symptoms of physical distress.  Continue with 45 min of aerobic exercise without signs/symptoms of physical distress.  Continue with 45 min of aerobic exercise without signs/symptoms of physical distress.  Continue with 45 min of aerobic exercise without signs/symptoms of physical distress.   Intensity  -  THRR unchanged  THRR unchanged  THRR unchanged  THRR unchanged     Progression   Progression  -  -  Continue to progress workloads to maintain intensity without signs/symptoms of physical distress.  Continue to progress workloads to maintain intensity without signs/symptoms of physical distress.  Continue to progress workloads to maintain intensity without signs/symptoms of physical distress.   Average METs  -  -  1.9  1.9  2.25     Resistance Training   Training Prescription  -  Yes  Yes  Yes  Yes   Weight  -  4 lb  4 lb  4 lb  4 lb   Reps  -  10-15  10-15  10-15  10-15     Interval Training    Interval Training  -  -  -  No  No     Treadmill   MPH  -  1  1.3  1.7  1.7   Grade  -  0  0  0  0   Minutes  -  '15  15  15  15   '$ METs  -  1.77  2  2.3  2.3     Recumbant Elliptical   Level  -  '1  1  1  '$ -   RPM  -  50  37  -  -   Minutes  -  '15  15  15  '$ -   METs  -  1.5  1.7  1.5  -     T5 Nustep   Level  -  2  2  -  2   SPM  -  80  -  -  -   Minutes  -  15  15  -  15   METs  -  2.5  2.9  -  2.2   Row Name 10/22/17 1100 10/29/17 1300 11/12/17 1000 11/26/17 1200       Response to Exercise   Blood Pressure (Admit)  -  122/66  122/74  134/74    Blood Pressure (Exit)  -  132/62  122/64  136/60    Heart Rate (Admit)  -  96 bpm  92 bpm  100 bpm    Heart Rate (Exercise)  -  114 bpm  111 bpm  101 bpm    Heart Rate (Exit)  -  105 bpm  97 bpm  98 bpm    Oxygen Saturation (Admit)  -  89 %  91 %  88 %    Oxygen Saturation (Exercise)  -  90 %  88 %  87 %    Oxygen Saturation (Exit)  -  92 %  90 %  91 %    Rating of Perceived Exertion (Exercise)  -  '15  15  15    '$ Perceived Dyspnea (Exercise)  -  '3  3  3    '$ Symptoms  -  none  SOB on treadmill  SOB    Duration  -  Continue with 45 min of aerobic exercise without signs/symptoms of physical distress.  Continue with 45 min of aerobic exercise without signs/symptoms of physical distress.  Continue with 45 min of aerobic exercise without signs/symptoms  of physical distress.    Intensity  -  THRR unchanged  THRR unchanged  THRR unchanged      Progression   Progression  -  Continue to progress workloads to maintain intensity without signs/symptoms of physical distress.  Continue to progress workloads to maintain intensity without signs/symptoms of physical distress.  Continue to progress workloads to maintain intensity without signs/symptoms of physical distress.    Average METs  -  2  2.25  2.37      Resistance Training   Training Prescription  -  Yes  Yes  Yes    Weight  -  4 lb  4 lb  4 lb    Reps  -  10-15  10-15  10-15      Interval  Training   Interval Training  -  No  No  No      Treadmill   MPH  -  1.7  1.7  1.7    Grade  -  0  0  0.5    Minutes  -  '15  15  15    '$ METs  -  2.3  2.3  2.3      Recumbant Elliptical   Level  -  2  1  -    RPM  -  30  -  -    Minutes  -  15  15  -    METs  -  1.6  -  -      T5 Nustep   Level  -  -  3  3    Minutes  -  -  15  15    METs  -  -  2.2  2.3      Home Exercise Plan   Plans to continue exercise at  Home (comment) walk  Home (comment) walk  Home (comment) walk  Home (comment) walk    Frequency  Add 1 additional day to program exercise sessions.  Add 1 additional day to program exercise sessions.  Add 1 additional day to program exercise sessions.  Add 1 additional day to program exercise sessions.    Initial Home Exercises Provided  10/22/17 reviewed with staff today, interpreter last week  10/22/17 reviewed with staff today, interpreter last week  10/22/17 reviewed with staff today, interpreter last week  10/22/17 reviewed with staff today, interpreter last week       Exercise Comments: Exercise Comments    Row Name 08/25/17 1028           Exercise Comments  First full day of exercise!  Patient was oriented to gym and equipment including functions, settings, policies, and procedures.  Patient's individual exercise prescription and treatment plan were reviewed.  All starting workloads were established based on the results of the 6 minute walk test done at initial orientation visit.  The plan for exercise progression was also introduced and progression will be customized based on patient's performance and goals.          Exercise Goals and Review: Exercise Goals    Row Name 08/19/17 1251             Exercise Goals   Increase Physical Activity  Yes       Intervention  Provide advice, education, support and counseling about physical activity/exercise needs.;Develop an individualized exercise prescription for aerobic and resistive training based on initial  evaluation findings, risk stratification, comorbidities and participant's personal goals.       Expected  Outcomes  Short Term: Attend rehab on a regular basis to increase amount of physical activity.;Long Term: Add in home exercise to make exercise part of routine and to increase amount of physical activity.;Long Term: Exercising regularly at least 3-5 days a week.       Increase Strength and Stamina  Yes       Intervention  Provide advice, education, support and counseling about physical activity/exercise needs.;Develop an individualized exercise prescription for aerobic and resistive training based on initial evaluation findings, risk stratification, comorbidities and participant's personal goals.       Expected Outcomes  Short Term: Increase workloads from initial exercise prescription for resistance, speed, and METs.;Short Term: Perform resistance training exercises routinely during rehab and add in resistance training at home;Long Term: Improve cardiorespiratory fitness, muscular endurance and strength as measured by increased METs and functional capacity (6MWT)       Able to understand and use rate of perceived exertion (RPE) scale  Yes       Intervention  Provide education and explanation on how to use RPE scale       Expected Outcomes  Short Term: Able to use RPE daily in rehab to express subjective intensity level;Long Term:  Able to use RPE to guide intensity level when exercising independently       Able to understand and use Dyspnea scale  Yes       Intervention  Provide education and explanation on how to use Dyspnea scale       Expected Outcomes  Short Term: Able to use Dyspnea scale daily in rehab to express subjective sense of shortness of breath during exertion;Long Term: Able to use Dyspnea scale to guide intensity level when exercising independently       Knowledge and understanding of Target Heart Rate Range (THRR)  Yes       Intervention  Provide education and explanation of THRR  including how the numbers were predicted and where they are located for reference       Expected Outcomes  Short Term: Able to state/look up THRR;Short Term: Able to use daily as guideline for intensity in rehab;Long Term: Able to use THRR to govern intensity when exercising independently       Able to check pulse independently  Yes       Intervention  Provide education and demonstration on how to check pulse in carotid and radial arteries.;Review the importance of being able to check your own pulse for safety during independent exercise       Expected Outcomes  Short Term: Able to explain why pulse checking is important during independent exercise;Long Term: Able to check pulse independently and accurately       Understanding of Exercise Prescription  Yes       Intervention  Provide education, explanation, and written materials on patient's individual exercise prescription       Expected Outcomes  Short Term: Able to explain program exercise prescription;Long Term: Able to explain home exercise prescription to exercise independently          Exercise Goals Re-Evaluation : Exercise Goals Re-Evaluation    Row Name 08/25/17 1028 09/03/17 1133 09/17/17 1445 09/30/17 1330 10/13/17 1607     Exercise Goal Re-Evaluation   Exercise Goals Review  Understanding of Exercise Prescription;Able to understand and use Dyspnea scale;Knowledge and understanding of Target Heart Rate Range (THRR);Able to understand and use rate of perceived exertion (RPE) scale  Increase Physical Activity;Able to understand and use rate of perceived  exertion (RPE) scale;Increase Strength and Stamina;Able to understand and use Dyspnea scale  Increase Physical Activity;Able to understand and use rate of perceived exertion (RPE) scale;Increase Strength and Stamina;Able to understand and use Dyspnea scale  Increase Physical Activity;Increase Strength and Stamina;Able to understand and use Dyspnea scale;Able to understand and use rate of  perceived exertion (RPE) scale  Increase Physical Activity;Increase Strength and Stamina;Able to understand and use Dyspnea scale;Able to understand and use rate of perceived exertion (RPE) scale   Comments  Reviewed RPE scale, THR and program prescription with pt today.  Pt voiced understanding and was given a copy of goals to take home.   Joe Torres is tolerating exercise well.  Staff will continue to monitor.  Joe Torres has been able to work longer without stopping for a break on machines.  He is working on PLB more to control SOB.  Staff will continue to monitor.  Pt is able to work longer on equipment without rest breaks.  MET level is about the same but duration has improved.  Pt has improved overall MET level slightly.  He has increased speed on TM.  He ha smade progress in endurance and does not have to rest as much on machines.   Expected Outcomes  Short: Use RPE daily to regulate intensity.  Long: Follow program prescription in [facility].  Joe Torres will attend class regularly  Joe Torres will improve overall fitness level  Joe Torres will attend class 2-3 days per week Joe Torres will improve MET level  Short - pt will continue to attend Long - Pt will increase MET level  Short - Pt will add inlcine to TM Long - Pt will increase MET level higher than current level   Row Name 10/22/17 1146 10/29/17 1309 11/12/17 1053 11/17/17 1104 11/26/17 1245     Exercise Goal Re-Evaluation   Exercise Goals Review  Increase Physical Activity;Able to understand and use rate of perceived exertion (RPE) scale;Knowledge and understanding of Target Heart Rate Range (THRR);Increase Strength and Stamina;Able to understand and use Dyspnea scale  Increase Physical Activity;Able to understand and use rate of perceived exertion (RPE) scale;Knowledge and understanding of Target Heart Rate Range (THRR);Understanding of Exercise Prescription;Increase Strength and Stamina;Able to understand and use Dyspnea scale  Increase Physical  Activity;Understanding of Exercise Prescription;Increase Strength and Stamina  Increase Physical Activity;Understanding of Exercise Prescription;Increase Strength and Stamina;Able to understand and use Dyspnea scale;Knowledge and understanding of Target Heart Rate Range (THRR);Able to understand and use rate of perceived exertion (RPE) scale;Able to check pulse independently  Increase Physical Activity;Able to understand and use rate of perceived exertion (RPE) scale;Increase Strength and Stamina;Able to understand and use Dyspnea scale   Comments  Joe Torres had the interpreter review home exercise last week.  This EP reviewed to day to check for other questions or concerns.  RPE, HR safety reviewed. Pt voiced understanding  Pt has not been consistent attending 2-3 days per week. Regular attendance will enhance progress.  Baraa has been doing well in rehab. His attendance had gotten better for a bit.  He is stil struggling to complete his full 49mn on the treadmill consistently.  We will continue to monitor his progress.   JCeciliocontinues to make progress in Pulmonary Rehab as his attendance has become more consistant. He is still working on being able to maintain his walking for a continual 15 min on the TM. He currently takes breaks when needed every several minutes.    JThelmerhas improved  stamina with exercise.  Staff will monitor progress.   Expected Outcomes  Short - Joe Torres  will walk at home on days not at [facility] and monitor 02 and HR with [place] will maintain exercise on his own  Short - Pt will attend 2-3 days per week Long - pt will maintain fitness on his own  Short: Continue to try to attend regulary.  Long: Continue to exercise on his own.   Short: Walk at least 10-15 min on TM without having to take breaks.    Long: Continue to exercise on his own and increase strength and stamina.   Short - continue to practice PLB Long - increase overall MET level      Discharge Exercise Prescription (Final  Exercise Prescription Changes): Exercise Prescription Changes - 11/26/17 1200      Response to Exercise   Blood Pressure (Admit)  134/74    Blood Pressure (Exit)  136/60    Heart Rate (Admit)  100 bpm    Heart Rate (Exercise)  101 bpm    Heart Rate (Exit)  98 bpm    Oxygen Saturation (Admit)  88 %    Oxygen Saturation (Exercise)  87 %    Oxygen Saturation (Exit)  91 %    Rating of Perceived Exertion (Exercise)  15    Perceived Dyspnea (Exercise)  3    Symptoms  SOB    Duration  Continue with 45 min of aerobic exercise without signs/symptoms of physical distress.    Intensity  THRR unchanged      Progression   Progression  Continue to progress workloads to maintain intensity without signs/symptoms of physical distress.    Average METs  2.37      Resistance Training   Training Prescription  Yes    Weight  4 lb    Reps  10-15      Interval Training   Interval Training  No      Treadmill   MPH  1.7    Grade  0.5    Minutes  15    METs  2.3      T5 Nustep   Level  3    Minutes  15    METs  2.3      Home Exercise Plan   Plans to continue exercise at  Home (comment) walk    Frequency  Add 1 additional day to program exercise sessions.    Initial Home Exercises Provided  10/22/17 reviewed with staff today, interpreter last week       Nutrition:  Target Goals: Understanding of nutrition guidelines, daily intake of sodium '1500mg'$ , cholesterol '200mg'$ , calories 30% from fat and 7% or less from saturated fats, daily to have 5 or more servings of fruits and vegetables.  Biometrics: Pre Biometrics - 08/19/17 1252      Pre Biometrics   Height  5' 6.1" (1.679 m)    Weight  166 lb (75.3 kg)    Waist Circumference  39 inches    Hip Circumference  38 inches    Waist to Hip Ratio  1.03 %    BMI (Calculated)  26.71        Nutrition Therapy Plan and Nutrition Goals: Nutrition Therapy & Goals - 11/17/17 1125      Nutrition Therapy   Diet  TLC    Protein (specify units)   10oz    Fiber  30 grams    Whole Grain Foods  3 servings  Saturated Fats  13 max. grams    Fruits and Vegetables  8 servings/day eats vegetables with meals, fruits with and between meals daily    Sodium  2000 grams      Personal Nutrition Goals   Nutrition Goal  Try to avoid drinking fluids at meal times to improve breathing and decrease lethargy while eating    Personal Goal #2  Continue to limit sodium while cooking, treat sea salt the same as you would table salt in terms of restriction    Personal Goal #3  Consume a source of protein with each meal. For example, add an egg to your fruit for breakfast    Comments  He has made several changes to his diet in recent years, including reducing soda intake to one can per day, eating less starch-heavy foods, reducing his sodium intake, eating less red/fatty meats, eating no fried foods or desserts on a regular basis      Intervention Plan   Intervention  Prescribe, educate and counsel regarding individualized specific dietary modifications aiming towards targeted core components such as weight, hypertension, lipid management, diabetes, heart failure and other comorbidities.;Nutrition handout(s) given to patient. Nutrition guidelines for COPD    Expected Outcomes  Short Term Goal: A plan has been developed with personal nutrition goals set during dietitian appointment.;Short Term Goal: Understand basic principles of dietary content, such as calories, fat, sodium, cholesterol and nutrients.;Long Term Goal: Adherence to prescribed nutrition plan.       Nutrition Assessments: Nutrition Assessments - 12/03/17 1045      MEDFICTS Scores   Pre Score  50    Post Score  47    Score Difference  -3       Nutrition Goals Re-Evaluation: Nutrition Goals Re-Evaluation    [place] Name 11/05/17 1114 11/17/17 1136           Goals   Current Weight  160 lb (72.6 kg)  -      Nutrition Goal  Joe Torres wants to learn how to eat better and lose some weight.   Continue to limit sodium while cooking, treat sea salt the same as you would table salt in terms of restriction      Comment  Joe Torres wants to make sure he is getting enough nutients and meet with the dietician  He and his wife have been using pink [place] sea salt when cooking but are unsure if they need to limit this the same way they do table salt      Expected Outcome  Short: meet with the dietician. Long: adhere to a diet plan  They will limit the total amount of sodium they consume / cook with each day        Personal Goal #2 Re-Evaluation   Personal Goal #2  -  Try to avoid drinking fluids at meal times to improve breathing and decrease lethargy while eating        Personal Goal #3 Re-Evaluation   Personal Goal #3  -  Consume a source of protein with each meal. For example, add an egg to your fruit for breakfast         Nutrition Goals Discharge (Final Nutrition Goals Re-Evaluation): Nutrition Goals Re-Evaluation - 11/17/17 1136      Goals   Nutrition Goal  Continue to limit sodium while cooking, treat sea salt the same as you would table salt in terms of restriction    Comment  He and his wife have been using pink [place]  sea salt when cooking but are unsure if they need to limit this the same way they do table salt    Expected Outcome  They will limit the total amount of sodium they consume / cook with each day      Personal Goal #2 Re-Evaluation   Personal Goal #2  Try to avoid drinking fluids at meal times to improve breathing and decrease lethargy while eating      Personal Goal #3 Re-Evaluation   Personal Goal #3  Consume a source of protein with each meal. For example, add an egg to your fruit for breakfast       Psychosocial: Target Goals: Acknowledge presence or absence of significant depression and/or stress, maximize coping skills, provide positive support system. Participant is able to verbalize types and ability to use techniques and skills needed for reducing stress  and depression.   Initial Review & Psychosocial Screening: Initial Psych Review & Screening - 08/19/17 1127      Initial Review   Current issues with  Current Stress Concerns    Source of Stress Concerns  Chronic Illness    Comments  He is not able to do yard work and be outside like he used to.      Family Dynamics   Good Support System?  Yes    Comments  His daughter is a good support system      Barriers   Psychosocial barriers to participate in program  The patient should benefit from training in stress management and relaxation.      Screening Interventions   Interventions  Encouraged to exercise;Program counselor consult;To provide support and resources with identified psychosocial needs;Provide feedback about the scores to participant    Expected Outcomes  Short Term goal: Utilizing psychosocial counselor, staff and physician to assist with identification of specific Stressors or current issues interfering with healing process. Setting desired goal for each stressor or current issue identified.;Long Term Goal: Stressors or current issues are controlled or eliminated.;Short Term goal: Identification and review with participant of any Quality of Life or Depression concerns found by scoring the questionnaire.;Long Term goal: The participant improves quality of Life and PHQ9 Scores as seen by post scores and/or verbalization of changes       Quality of Life Scores:  Scores of 19 and below usually indicate a poorer quality of life in these areas.  A difference of  2-3 points is a clinically meaningful difference.  A difference of 2-3 points in the total score of the Quality of Life Index has been associated with significant improvement in overall quality of life, self-image, physical symptoms, and general health in studies assessing change in quality of life.  PHQ-9: Recent Review Flowsheet Data    Depression screen [facility] 2/9 08/19/2017   Decreased Interest 2   Down, Depressed, Hopeless  1   PHQ - 2 Score 3   Altered sleeping 1   Tired, decreased energy 1   Change in appetite 0   Feeling bad or failure about yourself  0   Trouble concentrating 1   Moving slowly or fidgety/restless 0   Suicidal thoughts 0   PHQ-9 Score 6   Difficult doing work/chores Somewhat difficult     Interpretation of Total Score  Total Score Depression Severity:  1-4 = Minimal depression, 5-9 = Mild depression, 10-14 = Moderate depression, 15-19 = Moderately severe depression, 20-27 = Severe depression   Psychosocial Evaluation and Intervention: Psychosocial Evaluation - 08/27/17 1214  Psychosocial Evaluation & Interventions   Interventions  Encouraged to exercise with the program and follow exercise prescription    Comments  Counselor met with [name] [facility]) along with his Spanish interpreter today for the initial psychosocial evaluation.  He is a 72 year old who struggles with COPD.  Erice has a strong support system with a spouse; daughter and grandchildren who live locally.  He considers himself in good health mostly; and he sleeps well and has a "super" appetite.  Keshun denies a history of depression or anxiety and states he is typically in a positive mood.  He reports some stress with his health condition and not being able to do the activities he used to enjoy.  Joe Torres has goals for this program to feel better and breathe better.      Expected Outcomes  Short:  Joe Torres will exercise for stress and health benefits.   Long:  Joe Torres will continue to exercise consistently to improve his quality of life.      Continue Psychosocial Services   Follow up required by staff       Psychosocial Re-Evaluation: Psychosocial Re-Evaluation    [place] Name 10/03/17 1204 11/17/17 1109           Psychosocial Re-Evaluation   Current issues with  Current Stress Concerns  Current Stress Concerns      Comments  no change in stress  no change in stress      Expected Outcomes  -  Short: continue to attend pulmonary  rehab consistantly to help reduce stress levels. Long: Continue with independent exercise upon graduation to help reduce stress levels.       Interventions  Encouraged to attend Pulmonary Rehabilitation for the exercise  Encouraged to attend Pulmonary Rehabilitation for the exercise      Continue Psychosocial Services   Follow up required by staff  Follow up required by staff         Psychosocial Discharge (Final Psychosocial Re-Evaluation): Psychosocial Re-Evaluation - 11/17/17 1109      Psychosocial Re-Evaluation   Current issues with  Current Stress Concerns    Comments  no change in stress    Expected Outcomes  Short: continue to attend pulmonary rehab consistantly to help reduce stress levels. Long: Continue with independent exercise upon graduation to help reduce stress levels.     Interventions  Encouraged to attend Pulmonary Rehabilitation for the exercise    Continue Psychosocial Services   Follow up required by staff       Education: Education Goals: Education classes will be provided on a weekly basis, covering required topics. Participant will state understanding/return demonstration of topics presented.  Learning Barriers/Preferences: Learning Barriers/Preferences - 08/19/17 1136      Learning Barriers/Preferences   Learning Barriers  Sight;Language;Exercise Concerns wears glasses    Learning Preferences  Individual Instruction       Education Topics:  Initial Evaluation Education: - Verbal, written and demonstration of respiratory meds, oximetry and breathing techniques. Instruction on use of nebulizers and MDIs and importance of monitoring MDI activations.   Pulmonary Rehab from 12/01/2017 in [facility] Cardiac and Pulmonary Rehab  Date  08/19/17  Educator  [facility]  Instruction Review Code  1- Verbalizes Understanding      General Nutrition Guidelines/Fats and Fiber: -Group instruction provided by verbal, written material, models and posters to present the general  guidelines for heart healthy nutrition. Gives an explanation and review of dietary fats and fiber.   Pulmonary Rehab from 12/01/2017 in [facility]  Cardiac and Pulmonary Rehab  Date  11/24/17  Educator  CR  Instruction Review Code  5- Refused Teaching      Controlling Sodium/Reading Food Labels: -Group verbal and written material supporting the discussion of sodium use in heart healthy nutrition. Review and explanation with models, verbal and written materials for utilization of the food label.   Pulmonary Rehab from 12/01/2017 in [facility] Cardiac and Pulmonary Rehab  Date  10/13/17  Educator  CR  Instruction Review Code  1- Verbalizes Understanding      Exercise Physiology & General Exercise Guidelines: - Group verbal and written instruction with models to review the exercise physiology of the cardiovascular system and associated critical values. Provides general exercise guidelines with specific guidelines to those with heart or lung disease.    Pulmonary Rehab from 12/01/2017 in [facility] Cardiac and Pulmonary Rehab  Date  10/31/17  Educator  [facility]  Instruction Review Code  1- Verbalizes Understanding      Aerobic Exercise & Resistance Training: - Gives group verbal and written instruction on the various components of exercise. Focuses on aerobic and resistive training programs and the benefits of this training and how to safely progress through these programs.   Pulmonary Rehab from 12/01/2017 in [facility] Cardiac and Pulmonary Rehab  Date  11/26/17  Educator  [facility]  Instruction Review Code  1- Verbalizes Understanding      Flexibility, Balance, Mind/Body Relaxation: Provides group verbal/written instruction on the benefits of flexibility and balance training, including mind/body exercise modes such as yoga, pilates and tai chi.  Demonstration and skill practice provided.   Stress and Anxiety: - Provides group verbal and written instruction about the health risks of elevated stress and causes of high  stress.  Discuss the correlation between heart/lung disease and anxiety and treatment options. Review healthy ways to manage with stress and anxiety.   Depression: - Provides group verbal and written instruction on the correlation between heart/lung disease and depressed mood, treatment options, and the stigmas associated with seeking treatment.   Pulmonary Rehab from 12/01/2017 in [facility] Cardiac and Pulmonary Rehab  Date  11/19/17  Educator  [facility]  Instruction Review Code  1- Verbalizes Understanding      Exercise & Equipment Safety: - Individual verbal instruction and demonstration of equipment use and safety with use of the equipment.   Pulmonary Rehab from 12/01/2017 in [facility] Cardiac and Pulmonary Rehab  Date  08/19/17  Educator  [facility]  Instruction Review Code  1- Verbalizes Understanding      Infection Prevention: - Provides verbal and written material to individual with discussion of infection control including proper hand washing and proper equipment cleaning during exercise session.   Pulmonary Rehab from 12/01/2017 in [facility] Cardiac and Pulmonary Rehab  Date  08/19/17  Educator  [facility]  Instruction Review Code  1- Verbalizes Understanding      Falls Prevention: - Provides verbal and written material to individual with discussion of falls prevention and safety.   Pulmonary Rehab from 12/01/2017 in [facility] Cardiac and Pulmonary Rehab  Date  08/19/17  Educator  [facility]  Instruction Review Code  1- Verbalizes Understanding      Diabetes: - Individual verbal and written instruction to review signs/symptoms of diabetes, desired ranges of glucose level fasting, after meals and with exercise. Advice that pre and post exercise glucose checks will be done for 3 sessions at entry of program.   Chronic Lung Diseases: - Group verbal and written instruction to review updates, respiratory medications, advancements in  procedures and treatments. Discuss use of supplemental oxygen including available  portable oxygen systems, continuous and intermittent flow rates, concentrators, personal use and safety guidelines. Review proper use of inhaler and spacers. Provide informative websites for self-education.    Pulmonary Rehab from 12/01/2017 in [facility] Cardiac and Pulmonary Rehab  Date  10/29/17  Educator  [facility]  Instruction Review Code  1- Verbalizes Understanding      Energy Conservation: - Provide group verbal and written instruction for methods to conserve energy, plan and organize activities. Instruct on pacing techniques, use of adaptive equipment and posture/positioning to relieve shortness of breath.   Pulmonary Rehab from 12/01/2017 in [facility] Cardiac and Pulmonary Rehab  Date  10/01/17  Educator  [facility]  Instruction Review Code  1- Verbalizes Understanding      Triggers and Exacerbations: - Group verbal and written instruction to review types of environmental triggers and ways to prevent exacerbations. Discuss weather changes, air quality and the benefits of nasal washing. Review warning signs and symptoms to help prevent infections. Discuss techniques for effective airway clearance, coughing, and vibrations.   AED/CPR: - Group verbal and written instruction with the use of models to demonstrate the basic use of the AED with the basic ABC's of resuscitation.   Pulmonary Rehab from 12/01/2017 in [facility] Cardiac and Pulmonary Rehab  Date  10/03/17  Educator  [place]  Instruction Review Code  1- Verbalizes Understanding      Anatomy and Physiology of the Lungs: - Group verbal and written instruction with the use of models to provide basic lung anatomy and physiology related to function, structure and complications of lung disease.   Pulmonary Rehab from 12/01/2017 in [facility] Cardiac and Pulmonary Rehab  Date  10/15/17  Educator  [facility]  Instruction Review Code  1- Verbalizes Understanding      Anatomy & Physiology of the Heart: - Group verbal and written instruction and models provide basic cardiac  anatomy and physiology, with the coronary electrical and arterial systems. Review of Valvular disease and Heart Failure   Pulmonary Rehab from 12/01/2017 in [facility] Cardiac and Pulmonary Rehab  Date  09/10/17  Educator  [facility]  Instruction Review Code  1- Verbalizes Understanding      Cardiac Medications: - Group verbal and written instruction to review commonly prescribed medications for heart disease. Reviews the medication, class of the drug, and side effects.   Pulmonary Rehab from 12/01/2017 in [facility] Cardiac and Pulmonary Rehab  Date  09/19/17  Educator  [facility]  Instruction Review Code  1- Verbalizes Understanding      Know Your Numbers and Risk Factors: -Group verbal and written instruction about important numbers in your health.  Discussion of what are risk factors and how they play a role in the disease process.  Review of Cholesterol, Blood Pressure, Diabetes, and BMI and the role they play in your overall health.   Sleep Hygiene: -Provides group verbal and written instruction about how sleep can affect your health.  Define sleep hygiene, discuss sleep cycles and impact of sleep habits. Review good sleep hygiene tips.    Pulmonary Rehab from 12/01/2017 in [facility] Cardiac and Pulmonary Rehab  Date  11/05/17  Educator  [facility]  Instruction Review Code  1- Verbalizes Understanding      Other: -Provides group and verbal instruction on various topics (see comments)    Knowledge Questionnaire Score: Knowledge Questionnaire Score - 12/03/17 1045      Knowledge Questionnaire Score   Pre Score  16/18  Post Score  18/18 reviewed with pt        Core Components/Risk Factors/Patient Goals at Admission: Personal Goals and Risk Factors at Admission - 08/19/17 1144      Core Components/Risk Factors/Patient Goals on Admission    Weight Management  Yes;Weight Maintenance;Weight Loss    Intervention  Weight Management: Develop a combined nutrition and exercise program designed to reach desired  caloric intake, while maintaining appropriate intake of nutrient and fiber, sodium and fats, and appropriate energy expenditure required for the weight goal.;Weight Management: Provide education and appropriate resources to help participant work on and attain dietary goals.;Weight Management/Obesity: Establish reasonable short term and long term weight goals.    Admit Weight  166 lb (75.3 kg)    Goal Weight: Short Term  161 lb (73 kg)    Goal Weight: Long Term  155 lb (70.3 kg)    Expected Outcomes  Short Term: Continue to assess and modify interventions until short term weight is achieved;Long Term: Adherence to nutrition and physical activity/exercise program aimed toward attainment of established weight goal;Weight Maintenance: Understanding of the daily nutrition guidelines, which includes 25-35% calories from fat, 7% or less cal from saturated fats, less than '200mg'$  cholesterol, less than 1.5gm of sodium, & 5 or more servings of fruits and vegetables daily;Understanding recommendations for meals to include 15-35% energy as protein, 25-35% energy from fat, 35-60% energy from carbohydrates, less than '200mg'$  of dietary cholesterol, 20-35 gm of total fiber daily;Understanding of distribution of calorie intake throughout the day with the consumption of 4-5 meals/snacks;Weight Loss: Understanding of general recommendations for a balanced deficit meal plan, which promotes 1-2 lb weight loss per week and includes a negative energy balance of [phone] kcal/d    Improve shortness of breath with ADL's  Yes    Intervention  Provide education, individualized exercise plan and daily activity instruction to help decrease symptoms of SOB with activities of daily living.    Expected Outcomes  Short Term: Improve cardiorespiratory fitness to achieve a reduction of symptoms when performing ADLs;Long Term: Be able to perform more ADLs without symptoms or delay the onset of symptoms    Hypertension  Yes    Intervention   Provide education on lifestyle modifcations including regular physical activity/exercise, weight management, moderate sodium restriction and increased consumption of fresh fruit, vegetables, and low fat dairy, alcohol moderation, and smoking cessation.;Monitor prescription use compliance. takes medication for blood pressure    Expected Outcomes  Short Term: Continued assessment and intervention until BP is < 140/35m HG in hypertensive participants. < 130/862mHG in hypertensive participants with diabetes, heart failure or chronic kidney disease.;Long Term: Maintenance of blood pressure at goal levels. at home 140-145/80's.    Lipids  Yes    Intervention  Provide education and support for participant on nutrition & aerobic/resistive exercise along with prescribed medications to achieve LDL '70mg'$ , HDL >'40mg'$ .    Expected Outcomes  Short Term: Participant states understanding of desired cholesterol values and is compliant with medications prescribed. Participant is following exercise prescription and nutrition guidelines.;Long Term: Cholesterol controlled with medications as prescribed, with individualized exercise RX and with personalized nutrition plan. Value goals: LDL < '70mg'$ , HDL > 40 mg.       Core Components/Risk Factors/Patient Goals Review:  Goals and Risk Factor Review    Row Name 10/03/17 1201 11/05/17 1051 11/17/17 1101         Core Components/Risk Factors/Patient Goals Review   Personal Goals Review  Weight Management/Obesity;Improve shortness of breath  with ADL's;Develop more efficient breathing techniques such as purse lipped breathing and diaphragmatic breathing and practicing self-pacing with activity.;Stress  Weight Management/Obesity;Improve shortness of breath with ADL's;Hypertension  Weight Management/Obesity;Improve shortness of breath with ADL's;Hypertension     Review  Pt states he can tell he is breathing better and able to walk further.  He has made small steps of progress and  wants to continue to get back to his normal.  He doesn't have any changes in stress levels.  Kyrie states he is able to do more at home since his breathing is better. His weight has gone done to 160 pounds. His blood pressure has been going down since he started the program. He checks his blood pressure at home sometimes but needs to check it more often to make sure he is correlating to out measurements.  Benedict reports that his BP has been well under control and is taking all his meds as prescribed by his doctors. His weight has continued to decrease and he is now down to 158 lbs. He reports he is able to do more and be more active at home.      Expected Outcomes  Short - Pt will attend class regularly Long - Pt will improve oevrall MET level further  Short: check blood pressure at home. Long: maintain checking blood pressure at home once a day.  Short: Short term weight loss goal 155 lbs. Long: maintain acceptable blood pressures as he continues to monitor BP at home.         Core Components/Risk Factors/Patient Goals at Discharge (Final Review):  Goals and Risk Factor Review - 11/17/17 1101      Core Components/Risk Factors/Patient Goals Review   Personal Goals Review  Weight Management/Obesity;Improve shortness of breath with ADL's;Hypertension    Review  Mataio reports that his BP has been well under control and is taking all his meds as prescribed by his doctors. His weight has continued to decrease and he is now down to 158 lbs. He reports he is able to do more and be more active at home.     Expected Outcomes  Short: Short term weight loss goal 155 lbs. Long: maintain acceptable blood pressures as he continues to monitor BP at home.        ITP Comments: ITP Comments    Row Name 08/19/17 1046 09/15/17 0827 10/13/17 0820 11/10/17 0946 12/08/17 0848   ITP Comments  Medical Evaluation completed. Chart sent for review and changes to Dr. Emily Filbert Director of [facility]. Diagnosis can be found in [facility]  encounter 08/01/17   30 day review completed. ITP sent to Dr. Emily Filbert Director of [place]. Continue with ITP unless changes are made by physician   30 day review completed. ITP sent to Dr. Emily Filbert Director of [place]. Continue with ITP unless changes are made by physician   30 day review completed. ITP sent to Dr. Emily Filbert Director of [place]. Continue with ITP unless changes are made by physician  30 day review completed. ITP sent to Dr. Emily Filbert Director of [place]. Continue with ITP unless changes are made by physician      Comments: 30 day review

## 2017-12-08 NOTE — Progress Notes (Signed)
Daily Session Note  Patient Details  Name: Joe Torres MRN: [id number] Date of Birth: 1946-04-06 Referring Provider:     Pulmonary Rehab from 08/19/2017 in [facility] Cardiac and Pulmonary Rehab  Referring Provider  Jeanne Ivan MD      Encounter Date: 12/08/2017  Check In: Session Check In - 12/08/17 1018      Check-In   Supervising physician immediately available to respond to emergencies  [facility] immediately available ER MD    Physician(s)  Dr. Cinda Quest and Dr. Archie Balboa    Staff Present  Justin Mend RCP,RRT,BSRT;Tensley Wery Amedeo Plenty, BS, ACSM CEP, Exercise Physiologist;Amanda Oletta Darter, BA, ACSM CEP, Exercise Physiologist    Medication changes reported      No    Fall or balance concerns reported     No    Tobacco Cessation  No Change    Warm-up and Cool-down  Performed as group-led instruction    Resistance Training Performed  Yes    VAD Patient?  No    PAD/SET Patient?  No      Pain Assessment   Currently in Pain?  No/denies    Multiple Pain Sites  No          Social History   Tobacco Use  Smoking Status Former Smoker  . Packs/day: 1.50  . Years: 40.00  . Pack years: 60.00  . Types: Cigarettes  . Last attempt to quit: 05/21/2008  . Years since quitting: 9.5  Smokeless Tobacco Never Used    Goals Met:  Proper associated with RPD/PD & O2 Sat Independence with exercise equipment Exercise tolerated well No report of cardiac concerns or symptoms Strength training completed today  Goals Unmet:  Not Applicable  Comments: Pt able to follow exercise prescription today without complaint.  Will continue to monitor for progression.    Dr. Emily Filbert is Medical Director for [place] [facility] and [facility].

## 2017-12-09 ENCOUNTER — Encounter: Payer: Self-pay | Admitting: Physical Therapy

## 2017-12-09 ENCOUNTER — Ambulatory Visit: Payer: Medicare HMO | Admitting: Physical Therapy

## 2017-12-09 DIAGNOSIS — M542 Cervicalgia: Secondary | ICD-10-CM

## 2017-12-09 DIAGNOSIS — M62838 Other muscle spasm: Secondary | ICD-10-CM

## 2017-12-09 NOTE — Therapy (Signed)
[facility] [address] [place], [address] Phone: [phone]   Fax:  [phone]  Physical Therapy Treatment Physical Therapy Progress Note   Dates of reporting period 10/28/2017 to 12/09/2017   Patient Details  Name: Joe Torres MRN: [id number] Date of Birth: October 19, 1945 Referring Provider: Kingsley Spittle MD   Encounter Date: 12/09/2017  PT End of Session - 12/09/17 1032    Visit Number  10    Number of Visits  18    Date for PT Re-Evaluation  01/06/18    Authorization Type  10/10 progress note    PT Start Time  0948    PT Stop Time  1035    PT Time Calculation (min)  47 min    Activity Tolerance  Patient tolerated treatment well    Behavior During Therapy  [facility] for tasks assessed/performed       History reviewed. No pertinent past medical history.  History reviewed. No pertinent surgical history.  There were no vitals filed for this visit.  Subjective Assessment - 12/09/17 0952    Subjective  Patient reports he continues with spasms in left side of his neck with quick movement. His sleeping is better however when he switches positions he hurts more. He reports he would like to continue with physical therapy treatment and feels he is about 80% improved since beginning treatment.     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 Pain is worsening at this time and is radiating to the left shoulder, none in right shoulder.      Limitations  Lifting;Sitting;House hold activities;Other (comment) turning head right/left    Patient Stated Goals  decrease pain in neck so he can return to PLOF    Currently in Pain?  Yes    Pain Score  5  pain ranges from 3/10 up to 8/10 now    Pain Location   Neck    Pain Orientation  Left    Pain Descriptors / Indicators  Spasm;Tightness    Pain Type  Acute pain    Pain Onset  More than a month ago    Pain Frequency  Intermittent         Objective: Palpation: mild to moderate spasms and tenderness along upper trapezius and cervical spine paraspinal muscles left side  NDI score 34% (0%=no self perceived impairment)  AROM: 12/09/17: cervical spine flexion 50; extension 60; rotation left 35/right 45; side bend left 45/right 35 and to 45 degrees with stretch with pain reported left side of cervical spine/upper trapezius AROM initial eval: 10/28/17: cervical spine: flexion 40 with pain posteriorly; extension 40 degrees with increased left sided pain, rotation right and left 40 degrees with increased pain left side on return to neutral; side bend right 30; left 30 with pain on opposite side of side bend left>right; shoulders WNL bilateral  Strength: bilateral UE's WNL throughout major muscle groups with patient reporting discomfort in left shoulder with resistance of all motions   Treatment:  Manual Therapy: 15 min: goal : spasms, pain, improve ROM cervical spine Stretching for upper trapezius and levator performed to left side 3 x 10 seconds and mild cervical traction x 5 reps  to cervical spine, STM superficial techniques to upper trapezius muscle with patient seated with UE's supported    Therapeutic exercise: Patient performed with demonstration, verbal cues of therapist: goal: independent with HEP and improve function with ADLs without stiffness/pain Cervical spine retraction cervical spine rotations x 3 reps with instruction to perform 2x/day at home 5 reps in supine    Modalities: Electrical stimulation: 15 min: High volt estim.clincial program for muscle spasms  (4) electrodes applied to bilateral posterior cervical spine paraspinal muscles and upper trapezius muscles over spasms with intensity to tolerance with patient in sitting position  with UE's supported goal: pain, spasms Moist heat applied to both shoulders, cervical spine with estim. For pain, spasms, no adverse reactions noted   US/estim combination:  10 min left side cerivcal spine and upper trapezius with patient seated with left UE supported: 1MHz continuous @ 1.4 w/cm2 and HVGS intensity to tolerance, contraction ~80 volts   Patient response to treatment: Pain level decreased to 3/10 from 5/10 and patient improved rotations 10 degrees to the left (from 35 to 45) and side bend improved to 45 right with out pain/stretch felt on left side following treatment of US/estim/STM.         PT Education - 12/09/17 1056    Education Details  progress with therapy; goal progress; re assessed HEP    Person(s) Educated  Patient    Methods  Explanation    Comprehension  Verbalized understanding          PT Long Term Goals - 12/09/17 0954      PT LONG TERM GOAL #1   Title  Patient will report max pain level to 5/10 to allow improved function with daily activities involving neck     Baseline  intially 10/28/17: pain ranges from 7-10/10 and worse with turning head; 12/09/17: pain level ranges from 3-8/10    Status  On-going    Target Date  01/06/18      PT LONG TERM GOAL #2   Title  Patient will report max pain level to 3/10 to allow improved function with daily activities involving neck     Baseline  pain ranges from 7-10/10 and worse with turning head; max pain level 12/09/2017: 8/10    Status  On-going    Target Date  01/06/18      PT LONG TERM GOAL #3   Title  patient will be independent with home program for pain control and exercises for posture to allow self management of symptoms once discharged from physical therapy    Baseline  no knowledge of appropriate pain control strategies, posture correction or exercise progression without guided instruction and cuing    Status  On-going    Target Date  01/06/18      PT LONG TERM GOAL #4   Title  Patient will  demonstrate improved function with daily activities with decreased pain intensity to mild with NDI score of 20% or less    Baseline  NDI 34%    Status  New    Target Date  01/06/18            Plan - 12/09/17 1100    Clinical Impression Statement  Patient demonstrates good progress with physical therapy intervention. His pain level range has improved from 7-10/10 to 3-8/10 and his ROM cervical spine has improved in all planes of motion. He continues with spasms, pain that liimit full function with daily tasks and sleeping. He requires guidance for correct alignment and  technique with exercises and will therefore benefit from additional physical therapy intervention to achieve goals.     Rehab Potential  Good    Clinical Impairments Affecting Rehab Potential  (-)pain worsening with radating symptoms across back to left side/shoulder; multiple co morbidities    PT Frequency  2x / week    PT Duration  4 weeks    PT Treatment/Interventions  Iontophoresis 4mg /ml Dexamethasone;Cryotherapy;Electrical Stimulation;Ultrasound;Moist Heat;Therapeutic exercise;Therapeutic activities;Neuromuscular re-education;Patient/family education;Manual techniques;Dry needling    PT Next Visit Plan  re assess ROM, function and continue with current treatment as indicated    PT Home Exercise Plan  posture awareness, correction, use of lumbar and cervical rolls for positioning sitting and sleeping; scapular retraction standing with resistive bands; cervical spine extension isometrics, UE endurance exercises    Consulted and Agree with Plan of Care  Patient       Patient will benefit from skilled therapeutic intervention in order to improve the following deficits and impairments:  Pain, Postural dysfunction, Increased muscle spasms, Decreased activity tolerance, Decreased endurance, Decreased range of motion, Impaired perceived functional ability, Impaired UE functional use  Visit Diagnosis: Other muscle spasm - Plan:  PT plan of care cert/re-cert  Cervicalgia - Plan: PT plan of care cert/re-cert     Problem List There are no active problems to display for this patient.   Jomarie Longs PT 12/10/2017, 9:41 AM  [facility] [address], [place], [address] Phone: [phone]   Fax:  [phone]  Name: Joe Torres MRN: [id number] Date of Birth: [birth year]

## 2017-12-10 VITALS — Ht 66.1 in | Wt 158.0 lb

## 2017-12-10 DIAGNOSIS — J441 Chronic obstructive pulmonary disease with (acute) exacerbation: Secondary | ICD-10-CM | POA: Diagnosis not present

## 2017-12-10 NOTE — Progress Notes (Signed)
Daily Session Note  Patient Details  Name: Joe Torres MRN: [id number] Date of Birth: 17-Jul-1945 Referring Provider:     Pulmonary Rehab from 08/19/2017 in [facility] Cardiac and Pulmonary Rehab  Referring Provider  Joe Ivan MD      Encounter Date: 12/10/2017  Check In: Session Check In - 12/10/17 1037      Check-In   Supervising physician immediately available to respond to emergencies  [facility] immediately available ER MD    Physician(s)  Joe Torres and Joe Torres    Location  [facility]-Cardiac & Pulmonary Rehab    Staff Present  Justin Mend Lorre Nick, MA, RCEP, CCRP, Exercise Physiologist;Amanda Oletta Darter, [place], ACSM CEP, Exercise Physiologist    Medication changes reported      No    Fall or balance concerns reported     No    Warm-up and Cool-down  Performed as group-led instruction    Resistance Training Performed  Yes    VAD Patient?  No    PAD/SET Patient?  No      Pain Assessment   Currently in Pain?  No/denies    Multiple Pain Sites  No          Social History   Tobacco Use  Smoking Status Former Smoker  . Packs/day: 1.50  . Years: 40.00  . Pack years: 60.00  . Types: Cigarettes  . Last attempt to quit: 05/21/2008  . Years since quitting: 9.5  Smokeless Tobacco Never Used    Goals Met:  Proper associated with RPD/PD & O2 Sat Independence with exercise equipment Exercise tolerated well Strength training completed today  Goals Unmet:  Not Applicable  Comments:  [place] Name 08/19/17 1242 12/10/17 1038       6 Minute Walk   Phase  Initial  Discharge    Distance  810 feet  1220 feet    Distance % Change  -  50.6 %    Distance Feet Change  -  410 ft    Walk Time  4.83 minutes  6 minutes    # of Rest Breaks  6 14 sec, 8 sec, 9 sec, 11 sec, 21 sec, 7 sec  0    MPH  1.91  2.3    METS  2.58  3.1    RPE  15  13    Perceived Dyspnea   4  3    VO2 Peak  9.02  10.9    Symptoms  Yes (comment)  No    Comments  SOB, hot in hallway at  turn around  -    Resting HR  90 bpm  89 bpm    Resting BP  146/64  146/72    Resting Oxygen Saturation   94 %  93 %    Exercise Oxygen Saturation  during 6 min walk  87 %  87 %    Max Ex. HR  117 bpm  111 bpm    Max Ex. BP  164/74  142/70    2 Minute Post BP  156/62  136/64      Interval HR   1 Minute HR  107  105    2 Minute HR  109  105    3 Minute HR  114  104    4 Minute HR  115  101    5 Minute HR  116  111    6 Minute HR  117  108    2 Minute  Post HR  108  101    Interval Heart Rate?  Yes  Yes      Interval Oxygen   Interval Oxygen?  Yes  Yes    Baseline Oxygen Saturation %  94 %  93 %    1 Minute Oxygen Saturation %  91 % rest 1:51-2:05  92 %    1 Minute Liters of Oxygen  0 L Room Air  0 L    2 Minute Oxygen Saturation %  88 % rest 2:36-2:44  89 %    2 Minute Liters of Oxygen  0 L  0 L    3 Minute Oxygen Saturation %  87 % 3:12-3:21, 3:49-4:00  88 %    3 Minute Liters of Oxygen  0 L  0 L    4 Minute Oxygen Saturation %  88 % rest 4:39-5:00  88 %    4 Minute Liters of Oxygen  0 L  0 L    5 Minute Oxygen Saturation %  88 % rest 5:23-5:30  87 %    5 Minute Liters of Oxygen  0 L  0 L    6 Minute Oxygen Saturation %  89 %  88 %    6 Minute Liters of Oxygen  0 L  0 L    2 Minute Post Oxygen Saturation %  93 %  97 %    2 Minute Post Liters of Oxygen  0 L  0 L         Dr. Emily Filbert is Medical Director for [facility] and [facility].

## 2017-12-11 ENCOUNTER — Encounter: Payer: Medicare HMO | Admitting: Physical Therapy

## 2017-12-12 ENCOUNTER — Encounter: Payer: Medicare HMO | Attending: Pulmonary Disease | Admitting: *Deleted

## 2017-12-12 DIAGNOSIS — J441 Chronic obstructive pulmonary disease with (acute) exacerbation: Secondary | ICD-10-CM | POA: Diagnosis present

## 2017-12-12 NOTE — Progress Notes (Signed)
Daily Session Note  Patient Details  Name: Joe Torres MRN: [id number] Date of Birth: 11/24/1945 Referring Provider:     Pulmonary Rehab from 08/19/2017 in [facility] Cardiac and Pulmonary Rehab  Referring Provider  Jeanne Ivan MD      Encounter Date: 12/12/2017  Check In: Session Check In - 12/12/17 1025      Check-In   Supervising physician immediately available to respond to emergencies  [facility] immediately available ER MD    Physician(s)  Dr. Cherylann Banas and Jacqualine Code     Location  [facility]-Cardiac & Pulmonary Rehab    Staff Present  Justin Mend RCP,RRT,BSRT;Illyria Sobocinski Sherryll Burger, RN Vickki Hearing, BA, ACSM CEP, Exercise Physiologist    Medication changes reported      No    Fall or balance concerns reported     No    Tobacco Cessation  No Change    Warm-up and Cool-down  Performed as group-led instruction    Resistance Training Performed  Yes    VAD Patient?  No      Pain Assessment   Currently in Pain?  No/denies          Social History   Tobacco Use  Smoking Status Former Smoker  . Packs/day: 1.50  . Years: 40.00  . Pack years: 60.00  . Types: Cigarettes  . Last attempt to quit: 05/21/2008  . Years since quitting: 9.5  Smokeless Tobacco Never Used    Goals Met:  Proper associated with RPD/PD & O2 Sat Independence with exercise equipment Using PLB without cueing & demonstrates good technique Exercise tolerated well No report of cardiac concerns or symptoms Strength training completed today  Goals Unmet:  Not Applicable  Comments: Pt able to follow exercise prescription today without complaint.  Will continue to monitor for progression.    Dr. Emily Filbert is Medical Director for [facility] and [facility].

## 2017-12-16 ENCOUNTER — Encounter: Payer: Self-pay | Admitting: Physical Therapy

## 2017-12-16 ENCOUNTER — Ambulatory Visit: Payer: Medicare HMO | Attending: Internal Medicine | Admitting: Physical Therapy

## 2017-12-16 DIAGNOSIS — M542 Cervicalgia: Secondary | ICD-10-CM | POA: Diagnosis present

## 2017-12-16 DIAGNOSIS — M62838 Other muscle spasm: Secondary | ICD-10-CM | POA: Insufficient documentation

## 2017-12-16 NOTE — Therapy (Signed)
[facility] [address], [place], [address] Phone: [phone]   Fax:  [phone]  Physical Therapy Treatment  Patient Details  Name: Joe Torres MRN: [id number] Date of Birth: May 02, 1946 Referring Provider: Kingsley Spittle MD   Encounter Date: 12/16/2017  PT End of Session - 12/16/17 1023    Visit Number  11    Number of Visits  18    Date for PT Re-Evaluation  01/06/18    Authorization Type  1/10 progress note    PT Start Time  0948    PT Stop Time  1030    PT Time Calculation (min)  42 min    Activity Tolerance  Patient tolerated treatment well    Behavior During Therapy  [facility] for tasks assessed/performed       History reviewed. No pertinent past medical history.  History reviewed. No pertinent surgical history.  There were no vitals filed for this visit.  Subjective Assessment - 12/16/17 0957    Subjective  Patient reports 4/10 pain/tightness left side of neck and pain in left shoulder lateral aspect.     Patient is accompained by:  --    Pertinent History  Patient reports he began having neck pain about 6 weeks ago when he extended his right arm to reach for a tool in the back of his [employer] from the front seat and felt pain in his chest and back of his neck. Pain is worsening at this time and is radiating to the left shoulder, none in right shoulder.      Limitations  Lifting;Sitting;House hold activities;Other (comment) turning head right/left    Patient Stated Goals  decrease pain in neck so he can return to PLOF    Currently in Pain?  Yes    Pain Score  4     Pain Location  Neck    Pain Orientation  Left    Pain Descriptors / Indicators  Spasm;Tightness    Pain Type  Acute pain    Pain Onset  More than a month ago    Pain Frequency  Intermittent       Objective: Palpation: mild to moderate spasms and tenderness along upper trapezius and cervical spine paraspinal muscles left side AROM:  cervical spine rotation right and left decreased ~25%; side bend equal right and left 40 degrees    Treatment:  Manual Therapy: 10 min: goal : spasms, pain, improve ROM cervical spine Stretching for upper trapezius and levator performed to left side 3 x 10 seconds and STM superficial techniques to upper trapezius muscle with patient seated with UE's supported    Therapeutic exercise: Patient performed with demonstration, verbal cues of therapist: goal: independent with HEP and improve function with ADLs without stiffness/pain Cervical spine retraction with isometric extension performed x 3 reps cervical spine isometric side bend x 5 reps with mild resistance of therapist, head in neutral posture/position cervical spine rotations x 3 reps, 2 sets in conjunction with STM   Modalities: Electrical stimulation: 15 min: High volt estim.clincial program for muscle spasms  (4) electrodes applied to bilateral posterior cervical spine paraspinal muscles and upper trapezius muscles over spasms with intensity to tolerance with patient in sitting position with UE's supported goal: pain, spasms Moist heat applied to both shoulders, cervical spine with estim. For pain, spasms, no adverse reactions noted   US/estim combination:  8 min left side cerivcal spine and upper trapezius with focus on cervical spine with  patient seated with left UE supported: 1MHz continuous @ 1.4 w/cm2 and HVGS intensity to tolerance, contraction ~70 volts   Patient response to treatment: Pain level decreased to 4/10 from 3/10 and patient with patient able to improve flexibility into side bending bilaterally      PT Education - 12/16/17 0958    Education Details  re assessed HEP    Person(s) Educated  Patient    Methods  Explanation    Comprehension  Verbalized understanding          PT Long Term Goals - 12/09/17 0954      PT LONG TERM GOAL #1   Title  Patient will report max pain level to 5/10 to allow improved  function with daily activities involving neck     Baseline  intially 10/28/17: pain ranges from 7-10/10 and worse with turning head; 12/09/17: pain level ranges from 3-8/10    Status  On-going    Target Date  01/06/18      PT LONG TERM GOAL #2   Title  Patient will report max pain level to 3/10 to allow improved function with daily activities involving neck     Baseline  pain ranges from 7-10/10 and worse with turning head; max pain level 12/09/2017: 8/10    Status  On-going    Target Date  01/06/18      PT LONG TERM GOAL #3   Title  patient will be independent with home program for pain control and exercises for posture to allow self management of symptoms once discharged from physical therapy    Baseline  no knowledge of appropriate pain control strategies, posture correction or exercise progression without guided instruction and cuing    Status  On-going    Target Date  01/06/18      PT LONG TERM GOAL #4   Title  Patient will demonstrate improved function with daily activities with decreased pain intensity to mild with NDI score of 20% or less    Baseline  NDI 34%    Status  New    Target Date  01/06/18            Plan - 12/16/17 1023    Clinical Impression Statement  Patient demonstrated improved flexibility in cervical spine rotations and lateral flexion following treatment. He continues with localized pain and spasms left side of cervical spine that is responding well and slowly to current treatment. He verbalized good understandiing of home exercises for isometric side bending and cervical spine extension.     Rehab Potential  Good    Clinical Impairments Affecting Rehab Potential  (-)pain worsening with radating symptoms across back to left side/shoulder; multiple co morbidities    PT Frequency  2x / week    PT Duration  4 weeks    PT Treatment/Interventions  Iontophoresis 4mg /ml Dexamethasone;Cryotherapy;Electrical Stimulation;Ultrasound;Moist Heat;Therapeutic  exercise;Therapeutic activities;Neuromuscular re-education;Patient/family education;Manual techniques;Dry needling    PT Next Visit Plan  re assess ROM, function and continue with current treatment as indicated    PT Home Exercise Plan  posture awareness, correction, use of lumbar and cervical rolls for positioning sitting and sleeping; scapular retraction standing with resistive bands; cervical spine extension isometrics, UE endurance exercises       Patient will benefit from skilled therapeutic intervention in order to improve the following deficits and impairments:  Pain, Postural dysfunction, Increased muscle spasms, Decreased activity tolerance, Decreased endurance, Decreased range of motion, Impaired perceived functional ability, Impaired UE functional use  Visit Diagnosis: Other  muscle spasm  Cervicalgia     Problem List There are no active problems to display for this patient.   Jomarie Longs PT 12/16/2017, 3:35 PM  [facility] [address], [place], [address] Phone: [phone]   Fax:  [phone]  Name: RC AMISON MRN: [id number] Date of Birth: 1945-09-02

## 2017-12-17 DIAGNOSIS — J441 Chronic obstructive pulmonary disease with (acute) exacerbation: Secondary | ICD-10-CM | POA: Diagnosis not present

## 2017-12-17 NOTE — Progress Notes (Signed)
Daily Session Note  Patient Details  Name: Joe Torres MRN: [id number] Date of Birth: 1945-05-26 Referring Provider:     Pulmonary Rehab from 08/19/2017 in [facility]  Referring Provider  Joe Ivan MD      Encounter Date: 12/17/2017  Check In: Session Check In - 12/17/17 1045      Check-In   Supervising physician immediately available to respond to emergencies  [facility] immediately available ER MD    Physician(s)  Joe Torres and Joe Torres    Location  [facility]-Cardiac & Pulmonary Rehab    Staff Present  Joe Mend Lorre Nick, MA, RCEP, CCRP, Exercise Physiologist;Joe Torres, [place], ACSM CEP, Exercise Physiologist    Medication changes reported      No    Fall or balance concerns reported     No    Warm-up and Cool-down  Performed as group-led instruction    Resistance Training Performed  Yes    VAD Patient?  No    PAD/SET Patient?  No      Pain Assessment   Currently in Pain?  No/denies    Multiple Pain Sites  No          Social History   Tobacco Use  Smoking Status Former Smoker  . Packs/day: 1.50  . Years: 40.00  . Pack years: 60.00  . Types: Cigarettes  . Last attempt to quit: 05/21/2008  . Years since quitting: 9.5  Smokeless Tobacco Never Used    Goals Met:  Proper associated with RPD/PD & O2 Sat Independence with exercise equipment Exercise tolerated well Strength training completed today  Goals Unmet:  Not Applicable  Comments:  Joe Torres graduated today from  rehab with 36 sessions completed.  Details of the patient's exercise prescription and what He needs to do in order to continue the prescription and progress were discussed with patient.  Patient was given a copy of prescription and goals.  Patient verbalized understanding.  Joe Torres plans to continue to exercise by working out at home.      Dr. Emily Torres is Medical Director for [facility] and [facility].

## 2017-12-17 NOTE — Progress Notes (Signed)
Pulmonary Individual Treatment Plan  Patient Details  Name: Joe Torres MRN: [id number] Date of Birth: [birth year] Referring Provider:     Pulmonary Rehab from 08/19/2017 in [facility] Cardiac and Pulmonary Rehab  Referring Provider  Jeanne Ivan MD      Initial Encounter Date:    Pulmonary Rehab from 08/19/2017 in [facility] Cardiac and Pulmonary Rehab  Date  08/19/17      Visit Diagnosis: COPD with acute exacerbation ([place])  Patient's Home Medications on Admission:  Current Outpatient Medications:  .  acetaminophen (TYLENOL) 500 MG tablet, Take 500 mg by mouth every 8 (eight) hours as needed., Disp: , Rfl:  .  albuterol (PROVENTIL HFA;VENTOLIN HFA) 108 (90 Base) MCG/ACT inhaler, Inhale 2 puffs into the lungs every 4 (four) hours as needed for wheezing or shortness of breath., Disp: , Rfl:  .  albuterol (PROVENTIL) (2.5 MG/3ML) 0.083% nebulizer solution, Take 2.5 mg by nebulization every 6 (six) hours as needed for wheezing or shortness of breath., Disp: , Rfl:  .  amLODipine (NORVASC) 10 MG tablet, Take 10 mg by mouth daily., Disp: , Rfl:  .  arformoterol (BROVANA) 15 MCG/2ML NEBU, Take 15 mcg by nebulization 2 (two) times daily., Disp: , Rfl:  .  cetirizine (ZYRTEC) 10 MG chewable tablet, Chew 10 mg by mouth daily., Disp: , Rfl:  .  hydrochlorothiazide (HYDRODIURIL) 25 MG tablet, Take 25 mg by mouth daily., Disp: , Rfl:  .  montelukast (SINGULAIR) 10 MG tablet, Take 10 mg by mouth at bedtime., Disp: , Rfl:  .  pantoprazole (PROTONIX) 40 MG tablet, Take 40 mg by mouth daily., Disp: , Rfl:  .  predniSONE (DELTASONE) 5 MG tablet, Take 5 mg by mouth daily with breakfast., Disp: , Rfl:  .  roflumilast (DALIRESP) 500 MCG TABS tablet, Take 500 mcg by mouth daily., Disp: , Rfl:  .  simvastatin (ZOCOR) 20 MG tablet, Take 20 mg by mouth daily., Disp: , Rfl:  .  tamsulosin (FLOMAX) 0.4 MG CAPS capsule, Take 0.4 mg by mouth., Disp: , Rfl:  .  traMADol (ULTRAM) 50 MG tablet, Take 50 mg by mouth every 6 (six)  hours as needed., Disp: , Rfl:   Past Medical History: No past medical history on file.  Tobacco Use: Social History   Tobacco Use  Smoking Status Former Smoker  . Packs/day: 1.50  . Years: 40.00  . Pack years: 60.00  . Types: Cigarettes  . Last attempt to quit: 05/21/2008  . Years since quitting: 9.5  Smokeless Tobacco Never Used    Labs: Recent Review Flowsheet Data    There is no flowsheet data to display.       Pulmonary Assessment Scores: Pulmonary Assessment Scores    Row Name 08/19/17 1132 12/03/17 1043       ADL UCSD   ADL Phase  Entry  Exit    SOB Score total  92  88    Rest  2  1    Walk  3  3    Stairs  5  5    Bath  4  4    Dress  4  4    Shop  4  4      CAT Score   CAT Score  27  25      mMRC Score   mMRC Score  4  -       Pulmonary Function Assessment: Pulmonary Function Assessment - 08/19/17 1133      Breath  Bilateral Breath Sounds  Wheezes    Shortness of Breath  Yes;Limiting activity;Panic with Shortness of Breath       Exercise Target Goals:    Exercise Program Goal: Individual exercise prescription set using results from initial 6 min walk test and THRR while considering  patient's activity barriers and safety.    Exercise Prescription Goal: Initial exercise prescription builds to 30-45 minutes a day of aerobic activity, 2-3 days per week.  Home exercise guidelines will be given to patient during program as part of exercise prescription that the participant will acknowledge.  Activity Barriers & Risk Stratification: Activity Barriers & Cardiac Risk Stratification - 08/19/17 1248      Activity Barriers & Cardiac Risk Stratification   Activity Barriers  Muscular Weakness;Deconditioning;Shortness of Breath;Joint Problems occasional L knee pain       6 Minute Walk: 6 Minute Walk    Row Name 08/19/17 1242 12/10/17 1038       6 Minute Walk   Phase  Initial  Discharge    Distance  810 feet  1220 feet    Distance % Change   -  50.6 %    Distance Feet Change  -  410 ft    Walk Time  4.83 minutes  6 minutes    # of Rest Breaks  6 14 sec, 8 sec, 9 sec, 11 sec, 21 sec, 7 sec  0    MPH  1.91  2.3    METS  2.58  3.1    RPE  15  13    Perceived Dyspnea   4  3    VO2 Peak  9.02  10.9    Symptoms  Yes (comment)  No    Comments  SOB, hot in hallway at turn around  -    Resting HR  90 bpm  89 bpm    Resting BP  146/64  146/72    Resting Oxygen Saturation   94 %  93 %    Exercise Oxygen Saturation  during 6 min walk  87 %  87 %    Max Ex. HR  117 bpm  111 bpm    Max Ex. BP  164/74  142/70    2 Minute Post BP  156/62  136/64      Interval HR   1 Minute HR  107  105    2 Minute HR  109  105    3 Minute HR  114  104    4 Minute HR  115  101    5 Minute HR  116  111    6 Minute HR  117  108    2 Minute Post HR  108  101    Interval Heart Rate?  Yes  Yes      Interval Oxygen   Interval Oxygen?  Yes  Yes    Baseline Oxygen Saturation %  94 %  93 %    1 Minute Oxygen Saturation %  91 % rest 1:51-2:05  92 %    1 Minute Liters of Oxygen  0 L Room Air  0 L    2 Minute Oxygen Saturation %  88 % rest 2:36-2:44  89 %    2 Minute Liters of Oxygen  0 L  0 L    3 Minute Oxygen Saturation %  87 % 3:12-3:21, 3:49-4:00  88 %    3 Minute Liters of Oxygen  0 L  0 L  4 Minute Oxygen Saturation %  88 % rest 4:39-5:00  88 %    4 Minute Liters of Oxygen  0 L  0 L    5 Minute Oxygen Saturation %  88 % rest 5:23-5:30  87 %    5 Minute Liters of Oxygen  0 L  0 L    6 Minute Oxygen Saturation %  89 %  88 %    6 Minute Liters of Oxygen  0 L  0 L    2 Minute Post Oxygen Saturation %  93 %  97 %    2 Minute Post Liters of Oxygen  0 L  0 L      Oxygen Initial Assessment: Oxygen Initial Assessment - 08/19/17 1139      Home Oxygen   Home Oxygen Device  Home Concentrator;E-Tanks    Sleep Oxygen Prescription  CPAP    Liters per minute  4    Home Exercise Oxygen Prescription  Continuous    Liters per minute  3    Home at Rest  Exercise Oxygen Prescription  Continuous    Liters per minute  3    Compliance with Home Oxygen Use  Yes      Initial 6 min Walk   Oxygen Used  None      Program Oxygen Prescription   Program Oxygen Prescription  None      Intervention   Short Term Goals  To learn and exhibit compliance with exercise, home and travel O2 prescription;To learn and understand importance of maintaining oxygen saturations>88%;To learn and demonstrate proper use of respiratory medications;To learn and demonstrate proper pursed lip breathing techniques or other breathing techniques.;To learn and understand importance of monitoring SPO2 with pulse oximeter and demonstrate accurate use of the pulse oximeter. Nebulizers albuterol BID. Takes 3 medications BID    Long  Term Goals  Exhibits compliance with exercise, home and travel O2 prescription;Verbalizes importance of monitoring SPO2 with pulse oximeter and return demonstration;Maintenance of O2 saturations>88%;Exhibits proper breathing techniques, such as pursed lip breathing or other method taught during program session;Compliance with respiratory medication;Demonstrates proper use of MDI's       Oxygen Re-Evaluation: Oxygen Re-Evaluation    Row Name 08/25/17 1029 11/05/17 1043 11/17/17 1057         Program Oxygen Prescription   Program Oxygen Prescription  None  None  None       Home Oxygen   Home Oxygen Device  Home Concentrator;E-Tanks  Home Concentrator;E-Tanks  Home Concentrator;E-Tanks     Sleep Oxygen Prescription  CPAP  CPAP  CPAP     Liters per minute  _0 Home Exercise Oxygen Prescription  Continuous  Continuous  Continuous     Liters per minute  _1 Home at Rest Exercise Oxygen Prescription  Continuous  Continuous he uses his oxygen at home as needed  Continuous     Liters per minute  _2 Compliance with Home Oxygen Use  Yes  Yes  Yes       Goals/Expected Outcomes   Short Term Goals  To learn and exhibit compliance  with exercise, home and travel O2 prescription;To learn and understand importance of maintaining oxygen saturations>88%;To learn and demonstrate proper use of respiratory medications;To learn and demonstrate proper pursed lip breathing techniques or other breathing techniques.;To learn and understand importance of monitoring SPO2 with pulse  oximeter and demonstrate accurate use of the pulse oximeter.  To learn and exhibit compliance with exercise, home and travel O2 prescription;To learn and understand importance of maintaining oxygen saturations>88%;To learn and demonstrate proper use of respiratory medications;To learn and demonstrate proper pursed lip breathing techniques or other breathing techniques.;To learn and understand importance of monitoring SPO2 with pulse oximeter and demonstrate accurate use of the pulse oximeter.  To learn and exhibit compliance with exercise, home and travel O2 prescription;To learn and understand importance of maintaining oxygen saturations>88%;To learn and demonstrate proper use of respiratory medications;To learn and demonstrate proper pursed lip breathing techniques or other breathing techniques.;To learn and understand importance of monitoring SPO2 with pulse oximeter and demonstrate accurate use of the pulse oximeter.     Long  Term Goals  Exhibits compliance with exercise, home and travel O2 prescription;Verbalizes importance of monitoring SPO2 with pulse oximeter and return demonstration;Maintenance of O2 saturations>88%;Exhibits proper breathing techniques, such as pursed lip breathing or other method taught during program session;Compliance with respiratory medication;Demonstrates proper use of MDI's  Exhibits compliance with exercise, home and travel O2 prescription;Verbalizes importance of monitoring SPO2 with pulse oximeter and return demonstration;Maintenance of O2 saturations>88%;Exhibits proper breathing techniques, such as pursed lip breathing or other method  taught during program session;Compliance with respiratory medication;Demonstrates proper use of MDI's  Exhibits compliance with exercise, home and travel O2 prescription;Verbalizes importance of monitoring SPO2 with pulse oximeter and return demonstration;Maintenance of O2 saturations>88%;Exhibits proper breathing techniques, such as pursed lip breathing or other method taught during program session;Compliance with respiratory medication;Demonstrates proper use of MDI's     Comments  Reviewed PLB technique with pt.  Talked about how it work and it's important to maintaining his exercise saturations.    Joe Torres is doing well at using his PLB when he is short of breath exercising. He is increasing his loads and is able to use the treadmill the whole time. He knows when he gets short of breath and takes a break if he needs to catch his breath. He checks his oxygen sometimes at home but not all the time. Informed him to check his oxygen when is is being active.  Joe Torres does now have a pulse oximeter and checks his oxygen saturations at home. He continues to use PLB to help with SOB.      Goals/Expected Outcomes  Short: Become more profiecient at using PLB.   Long: Become independent at using PLB.  Short: check oxygen levels at home when he is active. Long: maintain checking oxygen levels at home independently  Short: use pulse ox at home and mainatin oxygen saturations about 88%. Long: maintain checking oxygen levels and using PLB at home independently        Oxygen Discharge (Final Oxygen Re-Evaluation): Oxygen Re-Evaluation - 11/17/17 1057      Program Oxygen Prescription   Program Oxygen Prescription  None      Home Oxygen   Home Oxygen Device  Home Concentrator;E-Tanks    Sleep Oxygen Prescription  CPAP    Liters per minute  3    Home Exercise Oxygen Prescription  Continuous    Liters per minute  3    Home at Rest Exercise Oxygen Prescription  Continuous    Liters per minute  3    Compliance with  Home Oxygen Use  Yes      Goals/Expected Outcomes   Short Term Goals  To learn and exhibit compliance with exercise, home and travel O2 prescription;To learn and understand importance  of maintaining oxygen saturations>88%;To learn and demonstrate proper use of respiratory medications;To learn and demonstrate proper pursed lip breathing techniques or other breathing techniques.;To learn and understand importance of monitoring SPO2 with pulse oximeter and demonstrate accurate use of the pulse oximeter.    Long  Term Goals  Exhibits compliance with exercise, home and travel O2 prescription;Verbalizes importance of monitoring SPO2 with pulse oximeter and return demonstration;Maintenance of O2 saturations>88%;Exhibits proper breathing techniques, such as pursed lip breathing or other method taught during program session;Compliance with respiratory medication;Demonstrates proper use of MDI's    Comments  Joe Torres does now have a pulse oximeter and checks his oxygen saturations at home. He continues to use PLB to help with SOB.     Goals/Expected Outcomes  Short: use pulse ox at home and mainatin oxygen saturations about 88%. Long: maintain checking oxygen levels and using PLB at home independently       Initial Exercise Prescription: Initial Exercise Prescription - 08/19/17 1200      Date of Initial Exercise RX and Referring Provider   Date  08/19/17    Referring Provider  Jeanne Ivan MD      Treadmill   MPH  1.7    Grade  0.5    Minutes  15    METs  2.42      Recumbant Elliptical   Level  1    RPM  50    Minutes  15    METs  2.5      T5 Nustep   Level  1    SPM  80    Minutes  15    METs  2.5      Prescription Details   Frequency (times per week)  3    Duration  Progress to 45 minutes of aerobic exercise without signs/symptoms of physical distress      Intensity   THRR 40-80% of Max Heartrate  114-139    Ratings of Perceived Exertion  11-13    Perceived Dyspnea  0-4       Progression   Progression  Continue to progress workloads to maintain intensity without signs/symptoms of physical distress.      Resistance Training   Training Prescription  Yes    Weight  4lbs    Reps  10-15       Perform Capillary Blood Glucose checks as needed.  Exercise Prescription Changes: Exercise Prescription Changes    Row Name 08/19/17 1200 09/03/17 1100 09/17/17 1400 09/30/17 1300 10/13/17 1600     Response to Exercise   Blood Pressure (Admit)  146/64  122/80  130/60  140/72  144/72   Blood Pressure (Exercise)  164/74  156/74  -  -  -   Blood Pressure (Exit)  156/62  130/76  154/72  130/84  124/64   Heart Rate (Admit)  90 bpm  95 bpm  99 bpm  90 bpm  95 bpm   Heart Rate (Exercise)  117 bpm  115 bpm  119 bpm  107 bpm  111 bpm   Heart Rate (Exit)  108 bpm  107 bpm  104 bpm  91 bpm  104 bpm   Oxygen Saturation (Admit)  94 %  92 %  87 %  90 %  90 %   Oxygen Saturation (Exercise)  87 %  91 %  88 %  91 %  91 %   Oxygen Saturation (Exit)  93 %  93 %  89 %  90 %  94 %  Rating of Perceived Exertion (Exercise)  _0 Perceived Dyspnea (Exercise)  _1 Symptoms  SOB, hot in hall at turn  none  none  none  none   Comments  walk test results  -  -  -  -   Duration  -  Continue with 45 min of aerobic exercise without signs/symptoms of physical distress.  Continue with 45 min of aerobic exercise without signs/symptoms of physical distress.  Continue with 45 min of aerobic exercise without signs/symptoms of physical distress.  Continue with 45 min of aerobic exercise without signs/symptoms of physical distress.   Intensity  -  THRR unchanged  THRR unchanged  THRR unchanged  THRR unchanged     Progression   Progression  -  -  Continue to progress workloads to maintain intensity without signs/symptoms of physical distress.  Continue to progress workloads to maintain intensity without signs/symptoms of physical distress.  Continue to progress workloads to  maintain intensity without signs/symptoms of physical distress.   Average METs  -  -  1.9  1.9  2.25     Resistance Training   Training Prescription  -  Yes  Yes  Yes  Yes   Weight  -  4 lb  4 lb  4 lb  4 lb   Reps  -  10-15  10-15  10-15  10-15     Interval Training   Interval Training  -  -  -  No  No     Treadmill   MPH  -  1  1.3  1.7  1.7   Grade  -  0  0  0  0   Minutes  -  _2 METs  -  1.77  2  2.3  2.3     Recumbant Elliptical   Level  -  _3 -   RPM  -  50  37  -  -   Minutes  -  _4 -   METs  -  1.5  1.7  1.5  -     T5 Nustep   Level  -  2  2  -  2   SPM  -  80  -  -  -   Minutes  -  15  15  -  15   METs  -  2.5  2.9  -  2.2   Row Name 10/22/17 1100 10/29/17 1300 11/12/17 1000 11/26/17 1200 12/10/17 1500     Response to Exercise   Blood Pressure (Admit)  -  122/66  122/74  134/74  134/64   Blood Pressure (Exit)  -  132/62  122/64  136/60  136/64   Heart Rate (Admit)  -  96 bpm  92 bpm  100 bpm  110 bpm   Heart Rate (Exercise)  -  114 bpm  111 bpm  101 bpm  81 bpm   Heart Rate (Exit)  -  105 bpm  97 bpm  98 bpm  75 bpm   Oxygen Saturation (Admit)  -  89 %  91 %  88 %  91 %   Oxygen Saturation (Exercise)  -  90 %  88 %  87 %  94 %   Oxygen Saturation (Exit)  -  92 %  90 %  91 %  95 %   Rating of Perceived Exertion (Exercise)  -  _0 Perceived Dyspnea (Exercise)  -  _1 Symptoms  -  none  SOB on treadmill  SOB  -   Duration  -  Continue with 45 min of aerobic exercise without signs/symptoms of physical distress.  Continue with 45 min of aerobic exercise without signs/symptoms of physical distress.  Continue with 45 min of aerobic exercise without signs/symptoms of physical distress.  Continue with 45 min of aerobic exercise without signs/symptoms of physical distress.   Intensity  -  THRR unchanged  THRR unchanged  THRR unchanged  THRR unchanged     Progression   Progression  -  Continue to progress workloads to  maintain intensity without signs/symptoms of physical distress.  Continue to progress workloads to maintain intensity without signs/symptoms of physical distress.  Continue to progress workloads to maintain intensity without signs/symptoms of physical distress.  Continue to progress workloads to maintain intensity without signs/symptoms of physical distress.   Average METs  -  2  2.25  2.37  2.4     Resistance Training   Training Prescription  -  Yes  Yes  Yes  Yes   Weight  -  4 lb  4 lb  4 lb  4 lb   Reps  -  10-15  10-15  10-15  10-15     Interval Training   Interval Training  -  No  No  No  No     Treadmill   MPH  -  1.7  1.7  1.7  1.7   Grade  -  0  0  0.5  0.5   Minutes  -  _2 METs  -  2.3  2.3  2.3  2.3     Recumbant Elliptical   Level  -  2  1  -  3   RPM  -  30  -  -  -   Minutes  -  15  15  -  15   METs  -  1.6  -  -  1.3     T5 Nustep   Level  -  -  _3 Minutes  -  -  _4 METs  -  -  2.2  2.3  1.8     Home Exercise Plan   Plans to continue exercise at  Home (comment) walk  Home (comment) walk  Home (comment) walk  Home (comment) walk  -   Frequency  Add 1 additional day to program exercise sessions.  Add 1 additional day to program exercise sessions.  Add 1 additional day to program exercise sessions.  Add 1 additional day to program exercise sessions.  -   Initial Home Exercises Provided  10/22/17 reviewed with staff today, interpreter last week  10/22/17 reviewed with staff today, interpreter last week  10/22/17 reviewed with staff today, interpreter last week  10/22/17 reviewed with staff today, interpreter last week  -      Exercise Comments: Exercise Comments    Row Name 08/25/17 1028           Exercise Comments  First full day of exercise!  Patient was oriented to gym and equipment including functions,  settings, policies, and procedures.  Patient's individual exercise prescription and treatment plan were reviewed.  All starting  workloads were established based on the results of the 6 minute walk test done at initial orientation visit.  The plan for exercise progression was also introduced and progression will be customized based on patient's performance and goals.          Exercise Goals and Review: Exercise Goals    Row Name 08/19/17 1251             Exercise Goals   Increase Physical Activity  Yes       Intervention  Provide advice, education, support and counseling about physical activity/exercise needs.;Develop an individualized exercise prescription for aerobic and resistive training based on initial evaluation findings, risk stratification, comorbidities and participant's personal goals.       Expected Outcomes  Short Term: Attend rehab on a regular basis to increase amount of physical activity.;Long Term: Add in home exercise to make exercise part of routine and to increase amount of physical activity.;Long Term: Exercising regularly at least 3-5 days a week.       Increase Strength and Stamina  Yes       Intervention  Provide advice, education, support and counseling about physical activity/exercise needs.;Develop an individualized exercise prescription for aerobic and resistive training based on initial evaluation findings, risk stratification, comorbidities and participant's personal goals.       Expected Outcomes  Short Term: Increase workloads from initial exercise prescription for resistance, speed, and METs.;Short Term: Perform resistance training exercises routinely during rehab and add in resistance training at home;Long Term: Improve cardiorespiratory fitness, muscular endurance and strength as measured by increased METs and functional capacity (6MWT)       Able to understand and use rate of perceived exertion (RPE) scale  Yes       Intervention  Provide education and explanation on how to use RPE scale       Expected Outcomes  Short Term: Able to use RPE daily in rehab to express subjective intensity  level;Long Term:  Able to use RPE to guide intensity level when exercising independently       Able to understand and use Dyspnea scale  Yes       Intervention  Provide education and explanation on how to use Dyspnea scale       Expected Outcomes  Short Term: Able to use Dyspnea scale daily in rehab to express subjective sense of shortness of breath during exertion;Long Term: Able to use Dyspnea scale to guide intensity level when exercising independently       Knowledge and understanding of Target Heart Rate Range (THRR)  Yes       Intervention  Provide education and explanation of THRR including how the numbers were predicted and where they are located for reference       Expected Outcomes  Short Term: Able to state/look up THRR;Short Term: Able to use daily as guideline for intensity in rehab;Long Term: Able to use THRR to govern intensity when exercising independently       Able to check pulse independently  Yes       Intervention  Provide education and demonstration on how to check pulse in carotid and radial arteries.;Review the importance of being able to check your own pulse for safety during independent exercise       Expected Outcomes  Short Term: Able to explain why pulse checking is important during independent exercise;Long Term: Able to check  pulse independently and accurately       Understanding of Exercise Prescription  Yes       Intervention  Provide education, explanation, and written materials on patient's individual exercise prescription       Expected Outcomes  Short Term: Able to explain program exercise prescription;Long Term: Able to explain home exercise prescription to exercise independently          Exercise Goals Re-Evaluation : Exercise Goals Re-Evaluation    Row Name 08/25/17 1028 09/03/17 1133 09/17/17 1445 09/30/17 1330 10/13/17 1607     Exercise Goal Re-Evaluation   Exercise Goals Review  Understanding of Exercise Prescription;Able to understand and use Dyspnea  scale;Knowledge and understanding of Target Heart Rate Range (THRR);Able to understand and use rate of perceived exertion (RPE) scale  Increase Physical Activity;Able to understand and use rate of perceived exertion (RPE) scale;Increase Strength and Stamina;Able to understand and use Dyspnea scale  Increase Physical Activity;Able to understand and use rate of perceived exertion (RPE) scale;Increase Strength and Stamina;Able to understand and use Dyspnea scale  Increase Physical Activity;Increase Strength and Stamina;Able to understand and use Dyspnea scale;Able to understand and use rate of perceived exertion (RPE) scale  Increase Physical Activity;Increase Strength and Stamina;Able to understand and use Dyspnea scale;Able to understand and use rate of perceived exertion (RPE) scale   Comments  Reviewed RPE scale, THR and program prescription with pt today.  Pt voiced understanding and was given a copy of goals to take home.   Joshue is tolerating exercise well.  Staff will continue to monitor.  Abdiaziz has been able to work longer without stopping for a break on machines.  He is working on PLB more to control SOB.  Staff will continue to monitor.  Pt is able to work longer on equipment without rest breaks.  MET level is about the same but duration has improved.  Pt has improved overall MET level slightly.  He has increased speed on TM.  He ha smade progress in endurance and does not have to rest as much on machines.   Expected Outcomes  Short: Use RPE daily to regulate intensity.  Long: Follow program prescription in THR.  [place] will attend class regularly  Joe Torres will improve overall fitness level  Joe Torres will attend class 2-3 days per week [place] will improve MET level  Short - pt will continue to attend Long - Pt will increase MET level  Short - Pt will add inlcine to TM Long - Pt will increase MET level higher than current level   Row Name 10/22/17 1146 10/29/17 1309 11/12/17 1053 11/17/17  1104 11/26/17 1245     Exercise Goal Re-Evaluation   Exercise Goals Review  Increase Physical Activity;Able to understand and use rate of perceived exertion (RPE) scale;Knowledge and understanding of Target Heart Rate Range (THRR);Increase Strength and Stamina;Able to understand and use Dyspnea scale  Increase Physical Activity;Able to understand and use rate of perceived exertion (RPE) scale;Knowledge and understanding of Target Heart Rate Range (THRR);Understanding of Exercise Prescription;Increase Strength and Stamina;Able to understand and use Dyspnea scale  Increase Physical Activity;Understanding of Exercise Prescription;Increase Strength and Stamina  Increase Physical Activity;Understanding of Exercise Prescription;Increase Strength and Stamina;Able to understand and use Dyspnea scale;Knowledge and understanding of Target Heart Rate Range (THRR);Able to understand and use rate of perceived exertion (RPE) scale;Able to check pulse independently  Increase Physical Activity;Able to understand and use rate of perceived exertion (RPE) scale;Increase Strength and Stamina;Able  to understand and use Dyspnea scale   Comments  Author had the interpreter review home exercise last week.  This EP reviewed to day to check for other questions or concerns.  RPE, HR safety reviewed. Pt voiced understanding  Pt has not been consistent attending 2-3 days per week. Regular attendance will enhance progress.  Lillian has been doing well in rehab. His attendance had gotten better for a bit.  He is stil struggling to complete his full 5mn on the treadmill consistently.  We will continue to monitor his progress.   JAmarricontinues to make progress in Pulmonary Rehab as his attendance has become more consistant. He is still working on being able to maintain his walking for a continual 15 min on the TM. He currently takes breaks when needed every several minutes.    Joe Torres has improved stamina with exercise.  Staff will monitor progress.    Expected Outcomes  Short - Joe Torres [facility] and HR with [place]will maintain exercise on his own  Short - Pt will attend 2-3 days per week Long - pt will maintain fitness on his own  Short: Continue to try to attend regulary.  Long: Continue to exercise on his own.   Short: Walk at least 10-15 min on TM without having to take breaks.    Long: Continue to exercise on his own and increase strength and stamina.   Short - continue to practice PLB Long - increase overall MET level   Row Name 12/10/17 1522             Exercise Goal Re-Evaluation   Exercise Goals Review  Increase Physical Activity;Able to understand and use rate of perceived exertion (RPE) scale;Increase Strength and Stamina;Able to understand and use Dyspnea scale       Comments  Joe Torres improved 6 min walk by 50%.  He has made steady imporvement in time he can exercise without stopping to rest.       Expected Outcomes  Short - finish LW program Long - maintain fitness on his own          Discharge Exercise Prescription (Final Exercise Prescription Changes): Exercise Prescription Changes - 12/10/17 1500      Response to Exercise   Blood Pressure (Admit)  134/64    Blood Pressure (Exit)  136/64    Heart Rate (Admit)  110 bpm    Heart Rate (Exercise)  81 bpm    Heart Rate (Exit)  75 bpm    Oxygen Saturation (Admit)  91 %    Oxygen Saturation (Exercise)  94 %    Oxygen Saturation (Exit)  95 %    Rating of Perceived Exertion (Exercise)  15    Perceived Dyspnea (Exercise)  3    Duration  Continue with 45 min of aerobic exercise without signs/symptoms of physical distress.    Intensity  THRR unchanged      Progression   Progression  Continue to progress workloads to maintain intensity without signs/symptoms of physical distress.    Average METs  2.4      Resistance Training   Training Prescription  Yes    Weight  4 lb    Reps  10-15      Interval Training   Interval  Training  No      Treadmill   MPH  1.7    Grade  0.5    Minutes  15  METs  2.3      Recumbant Elliptical   Level  3    Minutes  15    METs  1.3      T5 Nustep   Level  3    Minutes  15    METs  1.8       Nutrition:  Target Goals: Understanding of nutrition guidelines, daily intake of sodium <1567m, cholesterol <2017m calories 30% from fat and 7% or less from saturated fats, daily to have 5 or more servings of fruits and vegetables.  Biometrics: Pre Biometrics - 08/19/17 1252      Pre Biometrics   Height  5' 6.1" (1.679 m)    Weight  166 lb (75.3 kg)    Waist Circumference  39 inches    Hip Circumference  38 inches    Waist to Hip Ratio  1.03 %    BMI (Calculated)  26.71      Post Biometrics - 12/10/17 1038       Post  Biometrics   Height  5' 6.1" (1.679 m)    Weight  158 lb (71.7 kg)    Waist Circumference  38 inches    Hip Circumference  36 inches    Waist to Hip Ratio  1.06 %    BMI (Calculated)  25.42       Nutrition Therapy Plan and Nutrition Goals: Nutrition Therapy & Goals - 11/17/17 1125      Nutrition Therapy   Diet  TLC    Protein (specify units)  10oz    Fiber  30 grams    Whole Grain Foods  3 servings    Saturated Fats  13 max. grams    Fruits and Vegetables  8 servings/day eats vegetables with meals, fruits with and between meals daily    Sodium  2000 grams      Personal Nutrition Goals   Nutrition Goal  Try to avoid drinking fluids at meal times to improve breathing and decrease lethargy while eating    Personal Goal #2  Continue to limit sodium while cooking, treat sea salt the same as you would table salt in terms of restriction    Personal Goal #3  Consume a source of protein with each meal. For example, add an egg to your fruit for breakfast    Comments  He has made several changes to his diet in recent years, including reducing soda intake to one can per day, eating less starch-heavy foods, reducing his sodium intake, eating less  red/fatty meats, eating no fried foods or desserts on a regular basis      Intervention Plan   Intervention  Prescribe, educate and counsel regarding individualized specific dietary modifications aiming towards targeted core components such as weight, hypertension, lipid management, diabetes, heart failure and other comorbidities.;Nutrition handout(s) given to patient. Nutrition guidelines for COPD    Expected Outcomes  Short Term Goal: A plan has been developed with personal nutrition goals set during dietitian appointment.;Short Term Goal: Understand basic principles of dietary content, such as calories, fat, sodium, cholesterol and nutrients.;Long Term Goal: Adherence to prescribed nutrition plan.       Nutrition Assessments: Nutrition Assessments - 12/03/17 1045      MEDFICTS Scores   Pre Score  50    Post Score  47    Score Difference  -3       Nutrition Goals Re-Evaluation: Nutrition Goals Re-Evaluation    [place] 11/05/17 1114 11/17/17 1136  Goals   Current Weight  160 lb (72.6 kg)  -      Nutrition Goal  Joe Torres wants to learn how to eat better and lose some weight.  Continue to limit sodium while cooking, treat sea salt the same as you would table salt in terms of restriction      Comment  Joe Torres wants to make sure he is getting enough nutients and meet with the dietician  He and his wife have been using pink [place] sea salt when cooking but are unsure if they need to limit this the same way they do table salt      Expected Outcome  Short: meet with the dietician. Long: adhere to a diet plan  They will limit the total amount of sodium they consume / cook with each day        Personal Goal #2 Re-Evaluation   Personal Goal #2  -  Try to avoid drinking fluids at meal times to improve breathing and decrease lethargy while eating        Personal Goal #3 Re-Evaluation   Personal Goal #3  -  Consume a source of protein with each meal. For example, add an egg to your fruit  for breakfast         Nutrition Goals Discharge (Final Nutrition Goals Re-Evaluation): Nutrition Goals Re-Evaluation - 11/17/17 1136      Goals   Nutrition Goal  Continue to limit sodium while cooking, treat sea salt the same as you would table salt in terms of restriction    Comment  He and his wife have been using pink [place] sea salt when cooking but are unsure if they need to limit this the same way they do table salt    Expected Outcome  They will limit the total amount of sodium they consume / cook with each day      Personal Goal #2 Re-Evaluation   Personal Goal #2  Try to avoid drinking fluids at meal times to improve breathing and decrease lethargy while eating      Personal Goal #3 Re-Evaluation   Personal Goal #3  Consume a source of protein with each meal. For example, add an egg to your fruit for breakfast       Psychosocial: Target Goals: Acknowledge presence or absence of significant depression and/or stress, maximize coping skills, provide positive support system. Participant is able to verbalize types and ability to use techniques and skills needed for reducing stress and depression.   Initial Review & Psychosocial Screening: Initial Psych Review & Screening - 08/19/17 1127      Initial Review   Current issues with  Current Stress Concerns    Source of Stress Concerns  Chronic Illness    Comments  He is not able to do yard work and be outside like he used to.      Family Dynamics   Good Support System?  Yes    Comments  His daughter is a good support system      Barriers   Psychosocial barriers to participate in program  The patient should benefit from training in stress management and relaxation.      Screening Interventions   Interventions  Encouraged to exercise;Program counselor consult;To provide support and resources with identified psychosocial needs;Provide feedback about the scores to participant    Expected Outcomes  Short Term goal: Utilizing  psychosocial counselor, staff and physician to assist with identification of specific Stressors or current issues interfering with healing  process. Setting desired goal for each stressor or current issue identified.;Long Term Goal: Stressors or current issues are controlled or eliminated.;Short Term goal: Identification and review with participant of any Quality of Life or Depression concerns found by scoring the questionnaire.;Long Term goal: The participant improves quality of Life and PHQ9 Scores as seen by post scores and/or verbalization of changes       Quality of Life Scores:  Scores of 19 and below usually indicate a poorer quality of life in these areas.  A difference of  2-3 points is a clinically meaningful difference.  A difference of 2-3 points in the total score of the Quality of Life Index has been associated with significant improvement in overall quality of life, self-image, physical symptoms, and general health in studies assessing change in quality of life.  PHQ-9: Recent Review Flowsheet Data    Depression screen [facility] - Eat 2/9 08/19/2017   Decreased Interest 2   Down, Depressed, Hopeless 1   PHQ - 2 Score 3   Altered sleeping 1   Tired, decreased energy 1   Change in appetite 0   Feeling bad or failure about yourself  0   Trouble concentrating 1   Moving slowly or fidgety/restless 0   Suicidal thoughts 0   PHQ-9 Score 6   Difficult doing work/chores Somewhat difficult     Interpretation of Total Score  Total Score Depression Severity:  1-4 = Minimal depression, 5-9 = Mild depression, 10-14 = Moderate depression, 15-19 = Moderately severe depression, 20-27 = Severe depression   Psychosocial Evaluation and Intervention: Psychosocial Evaluation - 12/17/17 1103      Discharge Psychosocial Assessment & Intervention   Comments  Counselor follow up with [facility] today prior to his discharge from this program.  He was communicating and understanding fairly well without an  interpreter.  Dailyn reports making much progress since coming into this program - "80% better".  He reports continuing to sleep well and has a good appetite.  He continues to manage stres well and his mood remains positive.  Joe Torres bought a piece of exercise equipment to continue his routine of positive self care.  Counselor commended Joe Torres for his progress and commitment to exercise as a lifestyle habit.         Psychosocial Re-Evaluation: Psychosocial Re-Evaluation    Joe Torres Name 10/03/17 1204 11/17/17 1109           Psychosocial Re-Evaluation   Current issues with  Current Stress Concerns  Current Stress Concerns      Comments  no change in stress  no change in stress      Expected Outcomes  -  Short: continue to attend pulmonary rehab consistantly to help reduce stress levels. Long: Continue with independent exercise upon graduation to help reduce stress levels.       Interventions  Encouraged to attend Pulmonary Rehabilitation for the exercise  Encouraged to attend Pulmonary Rehabilitation for the exercise      Continue Psychosocial Services   Follow up required by staff  Follow up required by staff         Psychosocial Discharge (Final Psychosocial Re-Evaluation): Psychosocial Re-Evaluation - 11/17/17 1109      Psychosocial Re-Evaluation   Current issues with  Current Stress Concerns    Comments  no change in stress    Expected Outcomes  Short: continue to attend pulmonary rehab consistantly to help reduce stress levels. Long: Continue with independent exercise upon graduation to help reduce stress levels.  Interventions  Encouraged to attend Pulmonary Rehabilitation for the exercise    Continue Psychosocial Services   Follow up required by staff       Education: Education Goals: Education classes will be provided on a weekly basis, covering required topics. Participant will state understanding/return demonstration of topics presented.  Learning Barriers/Preferences: Learning  Barriers/Preferences - 08/19/17 1136      Learning Barriers/Preferences   Learning Barriers  Sight;Language;Exercise Concerns wears glasses    Learning Preferences  Individual Instruction       Education Topics:  Initial Evaluation Education: - Verbal, written and demonstration of respiratory meds, oximetry and breathing techniques. Instruction on use of nebulizers and MDIs and importance of monitoring MDI activations.   Pulmonary Rehab from 12/17/2017 in [facility] Cardiac and Pulmonary Rehab  Date  08/19/17  Educator  [facility]  Instruction Review Code  1- Verbalizes Understanding      General Nutrition Guidelines/Fats and Fiber: -Group instruction provided by verbal, written material, models and posters to present the general guidelines for heart healthy nutrition. Gives an explanation and review of dietary fats and fiber.   Pulmonary Rehab from 12/17/2017 in [facility] Cardiac and Pulmonary Rehab  Date  11/24/17  Educator  CR  Instruction Review Code  5- Refused Teaching      Controlling Sodium/Reading Food Labels: -Group verbal and written material supporting the discussion of sodium use in heart healthy nutrition. Review and explanation with models, verbal and written materials for utilization of the food label.   Pulmonary Rehab from 12/17/2017 in [facility] Cardiac and Pulmonary Rehab  Date  10/13/17  Educator  CR  Instruction Review Code  1- Verbalizes Understanding      Exercise Physiology & General Exercise Guidelines: - Group verbal and written instruction with models to review the exercise physiology of the cardiovascular system and associated critical values. Provides general exercise guidelines with specific guidelines to those with heart or lung disease.    Pulmonary Rehab from 12/17/2017 in [facility] Cardiac and Pulmonary Rehab  Date  10/31/17  Educator  [facility]  Instruction Review Code  1- Verbalizes Understanding      Aerobic Exercise & Resistance Training: - Gives group verbal and written  instruction on the various components of exercise. Focuses on aerobic and resistive training programs and the benefits of this training and how to safely progress through these programs.   Pulmonary Rehab from 12/17/2017 in [facility] Cardiac and Pulmonary Rehab  Date  11/26/17  Educator  [facility]  Instruction Review Code  1- Verbalizes Understanding      Flexibility, Balance, Mind/Body Relaxation: Provides group verbal/written instruction on the benefits of flexibility and balance training, including mind/body exercise modes such as yoga, pilates and tai chi.  Demonstration and skill practice provided.   Stress and Anxiety: - Provides group verbal and written instruction about the health risks of elevated stress and causes of high stress.  Discuss the correlation between heart/lung disease and anxiety and treatment options. Review healthy ways to manage with stress and anxiety.   Pulmonary Rehab from 12/17/2017 in [facility] Cardiac and Pulmonary Rehab  Date  12/17/17  Educator  [facility]  Instruction Review Code  1- Verbalizes Understanding      Depression: - Provides group verbal and written instruction on the correlation between heart/lung disease and depressed mood, treatment options, and the stigmas associated with seeking treatment.   Pulmonary Rehab from 12/17/2017 in [facility] Cardiac and Pulmonary Rehab  Date  11/19/17  Educator  [facility]  Instruction Review Code  1-  Verbalizes Understanding      Exercise & Equipment Safety: - Individual verbal instruction and demonstration of equipment use and safety with use of the equipment.   Pulmonary Rehab from 12/17/2017 in [facility] Cardiac and Pulmonary Rehab  Date  08/19/17  Educator  [facility]  Instruction Review Code  1- Verbalizes Understanding      Infection Prevention: - Provides verbal and written material to individual with discussion of infection control including proper hand washing and proper equipment cleaning during exercise session.   Pulmonary Rehab from 12/17/2017  in [facility] Cardiac and Pulmonary Rehab  Date  08/19/17  Educator  [facility]  Instruction Review Code  1- Verbalizes Understanding      Falls Prevention: - Provides verbal and written material to individual with discussion of falls prevention and safety.   Pulmonary Rehab from 12/17/2017 in [facility] Cardiac and Pulmonary Rehab  Date  08/19/17  Educator  [facility]  Instruction Review Code  1- Verbalizes Understanding      Diabetes: - Individual verbal and written instruction to review signs/symptoms of diabetes, desired ranges of glucose level fasting, after meals and with exercise. Advice that pre and post exercise glucose checks will be done for 3 sessions at entry of program.   Pulmonary Rehab from 12/17/2017 in [facility] Cardiac and Pulmonary Rehab  Date  12/12/17  Educator  [facility]  Instruction Review Code  1- Verbalizes Understanding      Chronic Lung Diseases: - Group verbal and written instruction to review updates, respiratory medications, advancements in procedures and treatments. Discuss use of supplemental oxygen including available portable oxygen systems, continuous and intermittent flow rates, concentrators, personal use and safety guidelines. Review proper use of inhaler and spacers. Provide informative websites for self-education.    Pulmonary Rehab from 12/17/2017 in [facility] Cardiac and Pulmonary Rehab  Date  10/29/17  Educator  [facility]  Instruction Review Code  1- Verbalizes Understanding      Energy Conservation: - Provide group verbal and written instruction for methods to conserve energy, plan and organize activities. Instruct on pacing techniques, use of adaptive equipment and posture/positioning to relieve shortness of breath.   Pulmonary Rehab from 12/17/2017 in [facility] Cardiac and Pulmonary Rehab  Date  10/01/17  Educator  [facility]  Instruction Review Code  1- Verbalizes Understanding      Triggers and Exacerbations: - Group verbal and written instruction to review types of environmental triggers and  ways to prevent exacerbations. Discuss weather changes, air quality and the benefits of nasal washing. Review warning signs and symptoms to help prevent infections. Discuss techniques for effective airway clearance, coughing, and vibrations.   AED/CPR: - Group verbal and written instruction with the use of models to demonstrate the basic use of the AED with the basic ABC's of resuscitation.   Pulmonary Rehab from 12/17/2017 in [facility] Cardiac and Pulmonary Rehab  Date  10/03/17  Educator  [place]  Instruction Review Code  1- Verbalizes Understanding      Anatomy and Physiology of the Lungs: - Group verbal and written instruction with the use of models to provide basic lung anatomy and physiology related to function, structure and complications of lung disease.   Pulmonary Rehab from 12/17/2017 in [facility] Cardiac and Pulmonary Rehab  Date  10/15/17  Educator  [facility]  Instruction Review Code  1- Verbalizes Understanding      Anatomy & Physiology of the Heart: - Group verbal and written instruction and models provide basic cardiac anatomy and physiology, with the coronary electrical and arterial systems.  Review of Valvular disease and Heart Failure   Pulmonary Rehab from 12/17/2017 in [facility] Cardiac and Pulmonary Rehab  Date  12/10/17  Educator  [facility]  Instruction Review Code  1- Verbalizes Understanding      Cardiac Medications: - Group verbal and written instruction to review commonly prescribed medications for heart disease. Reviews the medication, class of the drug, and side effects.   Pulmonary Rehab from 12/17/2017 in [facility] Cardiac and Pulmonary Rehab  Date  09/19/17  Educator  [facility]  Instruction Review Code  1- Verbalizes Understanding      Know Your Numbers and Risk Factors: -Group verbal and written instruction about important numbers in your health.  Discussion of what are risk factors and how they play a role in the disease process.  Review of Cholesterol, Blood Pressure, Diabetes, and BMI and the  role they play in your overall health.   Sleep Hygiene: -Provides group verbal and written instruction about how sleep can affect your health.  Define sleep hygiene, discuss sleep cycles and impact of sleep habits. Review good sleep hygiene tips.    Pulmonary Rehab from 12/17/2017 in [facility] Cardiac and Pulmonary Rehab  Date  11/05/17  Educator  [facility]  Instruction Review Code  1- Verbalizes Understanding      Other: -Provides group and verbal instruction on various topics (see comments)    Knowledge Questionnaire Score: Knowledge Questionnaire Score - 12/03/17 1045      Knowledge Questionnaire Score   Pre Score  16/18    Post Score  18/18 reviewed with pt        Core Components/Risk Factors/Patient Goals at Admission: Personal Goals and Risk Factors at Admission - 08/19/17 1144      Core Components/Risk Factors/Patient Goals on Admission    Weight Management  Yes;Weight Maintenance;Weight Loss    Intervention  Weight Management: Develop a combined nutrition and exercise program designed to reach desired caloric intake, while maintaining appropriate intake of nutrient and fiber, sodium and fats, and appropriate energy expenditure required for the weight goal.;Weight Management: Provide education and appropriate resources to help participant work on and attain dietary goals.;Weight Management/Obesity: Establish reasonable short term and long term weight goals.    Admit Weight  166 lb (75.3 kg)    Goal Weight: Short Term  161 lb (73 kg)    Goal Weight: Long Term  155 lb (70.3 kg)    Expected Outcomes  Short Term: Continue to assess and modify interventions until short term weight is achieved;Long Term: Adherence to nutrition and physical activity/exercise program aimed toward attainment of established weight goal;Weight Maintenance: Understanding of the daily nutrition guidelines, which includes 25-35% calories from fat, 7% or less cal from saturated fats, less than 263m cholesterol, less  than 1.5gm of sodium, & 5 or more servings of fruits and vegetables daily;Understanding recommendations for meals to include 15-35% energy as protein, 25-35% energy from fat, 35-60% energy from carbohydrates, less than 2058mof dietary cholesterol, 20-35 gm of total fiber daily;Understanding of distribution of calorie intake throughout the day with the consumption of 4-5 meals/snacks;Weight Loss: Understanding of general recommendations for a balanced deficit meal plan, which promotes 1-2 lb weight loss per week and includes a negative energy balance of [phone] kcal/d    Improve shortness of breath with ADL's  Yes    Intervention  Provide education, individualized exercise plan and daily activity instruction to help decrease symptoms of SOB with activities of daily living.    Expected Outcomes  Short Term: Improve  cardiorespiratory fitness to achieve a reduction of symptoms when performing ADLs;Long Term: Be able to perform more ADLs without symptoms or delay the onset of symptoms    Hypertension  Yes    Intervention  Provide education on lifestyle modifcations including regular physical activity/exercise, weight management, moderate sodium restriction and increased consumption of fresh fruit, vegetables, and low fat dairy, alcohol moderation, and smoking cessation.;Monitor prescription use compliance. takes medication for blood pressure    Expected Outcomes  Short Term: Continued assessment and intervention until BP is < 140/76m HG in hypertensive participants. < 130/848mHG in hypertensive participants with diabetes, heart failure or chronic kidney disease.;Long Term: Maintenance of blood pressure at goal levels. at home 140-145/80's.    Lipids  Yes    Intervention  Provide education and support for participant on nutrition & aerobic/resistive exercise along with prescribed medications to achieve LDL <7085mHDL >14m76m  Expected Outcomes  Short Term: Participant states understanding of desired  cholesterol values and is compliant with medications prescribed. Participant is following exercise prescription and nutrition guidelines.;Long Term: Cholesterol controlled with medications as prescribed, with individualized exercise RX and with personalized nutrition plan. Value goals: LDL < 70mg64mL > 40 mg.       Core Components/Risk Factors/Patient Goals Review:  Goals and Risk Factor Review    Row Name 10/03/17 1201 11/05/17 1051 11/17/17 1101         Core Components/Risk Factors/Patient Goals Review   Personal Goals Review  Weight Management/Obesity;Improve shortness of breath with ADL's;Develop more efficient breathing techniques such as purse lipped breathing and diaphragmatic breathing and practicing self-pacing with activity.;Stress  Weight Management/Obesity;Improve shortness of breath with ADL's;Hypertension  Weight Management/Obesity;Improve shortness of breath with ADL's;Hypertension     Review  Pt states he can tell he is breathing better and able to walk further.  He has made small steps of progress and wants to continue to get back to his normal.  He doesn't have any changes in stress levels.  Umer states he is able to do more at home since his breathing is better. His weight has gone done to 160 pounds. His blood pressure has been going down since he started the program. He checks his blood pressure at home sometimes but needs to check it more often to make sure he is correlating to out measurements.  Tulio reports that his BP has been well under control and is taking all his meds as prescribed by his doctors. His weight has continued to decrease and he is now down to 158 lbs. He reports he is able to do more and be more active at home.      Expected Outcomes  Short - Pt will attend class regularly Long - Pt will improve oevrall MET level further  Short: check blood pressure at home. Long: maintain checking blood pressure at home once a day.  Short: Short term weight loss goal 155 lbs.  Long: maintain acceptable blood pressures as he continues to monitor BP at home.         Core Components/Risk Factors/Patient Goals at Discharge (Final Review):  Goals and Risk Factor Review - 11/17/17 1101      Core Components/Risk Factors/Patient Goals Review   Personal Goals Review  Weight Management/Obesity;Improve shortness of breath with ADL's;Hypertension    Review  Joe Torres Eileenrts that his BP has been well under control and is taking all his meds as prescribed by his doctors. His weight has continued to decrease and he is now  down to 158 lbs. He reports he is able to do more and be more active at home.     Expected Outcomes  Short: Short term weight loss goal 155 lbs. Long: maintain acceptable blood pressures as he continues to monitor BP at home.        ITP Comments: ITP Comments    Row Name 08/19/17 1046 09/15/17 0827 10/13/17 0820 11/10/17 0946 12/08/17 0848   ITP Comments  Medical Evaluation completed. Chart sent for review and changes to Dr. Emily Filbert Director of [facility]. Diagnosis can be found in [facility] encounter 08/01/17   30 day review completed. ITP sent to Dr. Emily Filbert Director of [facility]. Continue with ITP unless changes are made by physician   30 day review completed. ITP sent to Dr. Emily Filbert Director of [facility]. Continue with ITP unless changes are made by physician   30 day review completed. ITP sent to Dr. Emily Filbert Director of [facility]. Continue with ITP unless changes are made by physician  30 day review completed. ITP sent to Dr. Emily Filbert Director of [facility]. Continue with ITP unless changes are made by physician   [place] Name 12/17/17 1144           ITP Comments  Discharge ITP sent and signed by Dr. Sabra Heck.  Discharge Summary routed to PCP and cardiologist.          Comments: Discharge ITP

## 2017-12-17 NOTE — Patient Instructions (Signed)
Discharge Patient Instructions  Patient Details  Name: Joe Torres MRN: [id number] Date of Birth: 05-24-1945 Referring Provider:  Tedra Coupe, MD   Number of Visits: 69  Reason for Discharge:  Patient reached a stable level of exercise. Patient independent in their exercise. Patient has met program and personal goals.  Smoking History:  Social History   Tobacco Use  Smoking Status Former Smoker  . Packs/day: 1.50  . Years: 40.00  . Pack years: 60.00  . Types: Cigarettes  . Last attempt to quit: 05/21/2008  . Years since quitting: 9.5  Smokeless Tobacco Never Used    Diagnosis:  No diagnosis found.  Initial Exercise Prescription: Initial Exercise Prescription - 08/19/17 1200      Date of Initial Exercise RX and Referring Provider   Date  08/19/17    Referring Provider  Jeanne Ivan MD      Treadmill   MPH  1.7    Grade  0.5    Minutes  15    METs  2.42      Recumbant Elliptical   Level  1    RPM  50    Minutes  15    METs  2.5      T5 Nustep   Level  1    SPM  80    Minutes  15    METs  2.5      Prescription Details   Frequency (times per week)  3    Duration  Progress to 45 minutes of aerobic exercise without signs/symptoms of physical distress      Intensity   THRR 40-80% of Max Heartrate  114-139    Ratings of Perceived Exertion  11-13    Perceived Dyspnea  0-4      Progression   Progression  Continue to progress workloads to maintain intensity without signs/symptoms of physical distress.      Resistance Training   Training Prescription  Yes    Weight  4lbs    Reps  10-15       Discharge Exercise Prescription (Final Exercise Prescription Changes): Exercise Prescription Changes - 12/10/17 1500      Response to Exercise   Blood Pressure (Admit)  134/64    Blood Pressure (Exit)  136/64    Heart Rate (Admit)  110 bpm    Heart Rate (Exercise)  81 bpm    Heart Rate (Exit)  75 bpm    Oxygen Saturation (Admit)  91 %    Oxygen Saturation  (Exercise)  94 %    Oxygen Saturation (Exit)  95 %    Rating of Perceived Exertion (Exercise)  15    Perceived Dyspnea (Exercise)  3    Duration  Continue with 45 min of aerobic exercise without signs/symptoms of physical distress.    Intensity  THRR unchanged      Progression   Progression  Continue to progress workloads to maintain intensity without signs/symptoms of physical distress.    Average METs  2.4      Resistance Training   Training Prescription  Yes    Weight  4 lb    Reps  10-15      Interval Training   Interval Training  No      Treadmill   MPH  1.7    Grade  0.5    Minutes  15    METs  2.3      Recumbant Elliptical   Level  3    Minutes  15    METs  1.3      T5 Nustep   Level  3    Minutes  15    METs  1.8       Functional Capacity: 6 Minute Walk    Row Name 08/19/17 1242 12/10/17 1038       6 Minute Walk   Phase  Initial  Discharge    Distance  810 feet  1220 feet    Distance % Change  -  50.6 %    Distance Feet Change  -  410 ft    Walk Time  4.83 minutes  6 minutes    # of Rest Breaks  6 14 sec, 8 sec, 9 sec, 11 sec, 21 sec, 7 sec  0    MPH  1.91  2.3    METS  2.58  3.1    RPE  15  13    Perceived Dyspnea   4  3    VO2 Peak  9.02  10.9    Symptoms  Yes (comment)  No    Comments  SOB, hot in hallway at turn around  -    Resting HR  90 bpm  89 bpm    Resting BP  146/64  146/72    Resting Oxygen Saturation   94 %  93 %    Exercise Oxygen Saturation  during 6 min walk  87 %  87 %    Max Ex. HR  117 bpm  111 bpm    Max Ex. BP  164/74  142/70    2 Minute Post BP  156/62  136/64      Interval HR   1 Minute HR  107  105    2 Minute HR  109  105    3 Minute HR  114  104    4 Minute HR  115  101    5 Minute HR  116  111    6 Minute HR  117  108    2 Minute Post HR  108  101    Interval Heart Rate?  Yes  Yes      Interval Oxygen   Interval Oxygen?  Yes  Yes    Baseline Oxygen Saturation %  94 %  93 %    1 Minute Oxygen Saturation %   91 % rest 1:51-2:05  92 %    1 Minute Liters of Oxygen  0 L Room Air  0 L    2 Minute Oxygen Saturation %  88 % rest 2:36-2:44  89 %    2 Minute Liters of Oxygen  0 L  0 L    3 Minute Oxygen Saturation %  87 % 3:12-3:21, 3:49-4:00  88 %    3 Minute Liters of Oxygen  0 L  0 L    4 Minute Oxygen Saturation %  88 % rest 4:39-5:00  88 %    4 Minute Liters of Oxygen  0 L  0 L    5 Minute Oxygen Saturation %  88 % rest 5:23-5:30  87 %    5 Minute Liters of Oxygen  0 L  0 L    6 Minute Oxygen Saturation %  89 %  88 %    6 Minute Liters of Oxygen  0 L  0 L    2 Minute Post Oxygen Saturation %  93 %  97 %    2 Minute Post Liters  of Oxygen  0 L  0 L       Quality of Life:   Personal Goals: Goals established at orientation with interventions provided to work toward goal. Personal Goals and Risk Factors at Admission - 08/19/17 1144      Core Components/Risk Factors/Patient Goals on Admission    Weight Management  Yes;Weight Maintenance;Weight Loss    Intervention  Weight Management: Develop a combined nutrition and exercise program designed to reach desired caloric intake, while maintaining appropriate intake of nutrient and fiber, sodium and fats, and appropriate energy expenditure required for the weight goal.;Weight Management: Provide education and appropriate resources to help participant work on and attain dietary goals.;Weight Management/Obesity: Establish reasonable short term and long term weight goals.    Admit Weight  166 lb (75.3 kg)    Goal Weight: Short Term  161 lb (73 kg)    Goal Weight: Long Term  155 lb (70.3 kg)    Expected Outcomes  Short Term: Continue to assess and modify interventions until short term weight is achieved;Long Term: Adherence to nutrition and physical activity/exercise program aimed toward attainment of established weight goal;Weight Maintenance: Understanding of the daily nutrition guidelines, which includes 25-35% calories from fat, 7% or less cal from  saturated fats, less than 272m cholesterol, less than 1.5gm of sodium, & 5 or more servings of fruits and vegetables daily;Understanding recommendations for meals to include 15-35% energy as protein, 25-35% energy from fat, 35-60% energy from carbohydrates, less than 2057mof dietary cholesterol, 20-35 gm of total fiber daily;Understanding of distribution of calorie intake throughout the day with the consumption of 4-5 meals/snacks;Weight Loss: Understanding of general recommendations for a balanced deficit meal plan, which promotes 1-2 lb weight loss per week and includes a negative energy balance of [phone] kcal/d    Improve shortness of breath with ADL's  Yes    Intervention  Provide education, individualized exercise plan and daily activity instruction to help decrease symptoms of SOB with activities of daily living.    Expected Outcomes  Short Term: Improve cardiorespiratory fitness to achieve a reduction of symptoms when performing ADLs;Long Term: Be able to perform more ADLs without symptoms or delay the onset of symptoms    Hypertension  Yes    Intervention  Provide education on lifestyle modifcations including regular physical activity/exercise, weight management, moderate sodium restriction and increased consumption of fresh fruit, vegetables, and low fat dairy, alcohol moderation, and smoking cessation.;Monitor prescription use compliance. takes medication for blood pressure    Expected Outcomes  Short Term: Continued assessment and intervention until BP is < 140/9062mG in hypertensive participants. < 130/32m18m in hypertensive participants with diabetes, heart failure or chronic kidney disease.;Long Term: Maintenance of blood pressure at goal levels. at home 140-145/80's.    Lipids  Yes    Intervention  Provide education and support for participant on nutrition & aerobic/resistive exercise along with prescribed medications to achieve LDL <70mg66mL >40mg.22mExpected Outcomes  Short Term:  Participant states understanding of desired cholesterol values and is compliant with medications prescribed. Participant is following exercise prescription and nutrition guidelines.;Long Term: Cholesterol controlled with medications as prescribed, with individualized exercise RX and with personalized nutrition plan. Value goals: LDL < 70mg, 64m> 40 mg.        Personal Goals Discharge: Goals and Risk Factor Review - 11/17/17 1101      Core Components/Risk Factors/Patient Goals Review   Personal Goals Review  Weight Management/Obesity;Improve shortness of breath with  ADL's;Hypertension    Review  Woodley reports that his BP has been well under control and is taking all his meds as prescribed by his doctors. His weight has continued to decrease and he is now down to 158 lbs. He reports he is able to do more and be more active at home.     Expected Outcomes  Short: Short term weight loss goal 155 lbs. Long: maintain acceptable blood pressures as he continues to monitor BP at home.        Exercise Goals and Review: Exercise Goals    Row Name 08/19/17 1251             Exercise Goals   Increase Physical Activity  Yes       Intervention  Provide advice, education, support and counseling about physical activity/exercise needs.;Develop an individualized exercise prescription for aerobic and resistive training based on initial evaluation findings, risk stratification, comorbidities and participant's personal goals.       Expected Outcomes  Short Term: Attend rehab on a regular basis to increase amount of physical activity.;Long Term: Add in home exercise to make exercise part of routine and to increase amount of physical activity.;Long Term: Exercising regularly at least 3-5 days a week.       Increase Strength and Stamina  Yes       Intervention  Provide advice, education, support and counseling about physical activity/exercise needs.;Develop an individualized exercise prescription for aerobic and  resistive training based on initial evaluation findings, risk stratification, comorbidities and participant's personal goals.       Expected Outcomes  Short Term: Increase workloads from initial exercise prescription for resistance, speed, and METs.;Short Term: Perform resistance training exercises routinely during rehab and add in resistance training at home;Long Term: Improve cardiorespiratory fitness, muscular endurance and strength as measured by increased METs and functional capacity (6MWT)       Able to understand and use rate of perceived exertion (RPE) scale  Yes       Intervention  Provide education and explanation on how to use RPE scale       Expected Outcomes  Short Term: Able to use RPE daily in rehab to express subjective intensity level;Long Term:  Able to use RPE to guide intensity level when exercising independently       Able to understand and use Dyspnea scale  Yes       Intervention  Provide education and explanation on how to use Dyspnea scale       Expected Outcomes  Short Term: Able to use Dyspnea scale daily in rehab to express subjective sense of shortness of breath during exertion;Long Term: Able to use Dyspnea scale to guide intensity level when exercising independently       Knowledge and understanding of Target Heart Rate Range (THRR)  Yes       Intervention  Provide education and explanation of THRR including how the numbers were predicted and where they are located for reference       Expected Outcomes  Short Term: Able to state/look up THRR;Short Term: Able to use daily as guideline for intensity in rehab;Long Term: Able to use THRR to govern intensity when exercising independently       Able to check pulse independently  Yes       Intervention  Provide education and demonstration on how to check pulse in carotid and radial arteries.;Review the importance of being able to check your own pulse for safety during independent exercise  Expected Outcomes  Short Term: Able  to explain why pulse checking is important during independent exercise;Long Term: Able to check pulse independently and accurately       Understanding of Exercise Prescription  Yes       Intervention  Provide education, explanation, and written materials on patient's individual exercise prescription       Expected Outcomes  Short Term: Able to explain program exercise prescription;Long Term: Able to explain home exercise prescription to exercise independently          Nutrition & Weight - Outcomes: Pre Biometrics - 08/19/17 1252      Pre Biometrics   Height  5' 6.1" (1.679 m)    Weight  166 lb (75.3 kg)    Waist Circumference  39 inches    Hip Circumference  38 inches    Waist to Hip Ratio  1.03 %    BMI (Calculated)  26.71      Post Biometrics - 12/10/17 1038       Post  Biometrics   Height  5' 6.1" (1.679 m)    Weight  158 lb (71.7 kg)    Waist Circumference  38 inches    Hip Circumference  36 inches    Waist to Hip Ratio  1.06 %    BMI (Calculated)  25.42       Nutrition: Nutrition Therapy & Goals - 11/17/17 1125      Nutrition Therapy   Diet  TLC    Protein (specify units)  10oz    Fiber  30 grams    Whole Grain Foods  3 servings    Saturated Fats  13 max. grams    Fruits and Vegetables  8 servings/day eats vegetables with meals, fruits with and between meals daily    Sodium  2000 grams      Personal Nutrition Goals   Nutrition Goal  Try to avoid drinking fluids at meal times to improve breathing and decrease lethargy while eating    Personal Goal #2  Continue to limit sodium while cooking, treat sea salt the same as you would table salt in terms of restriction    Personal Goal #3  Consume a source of protein with each meal. For example, add an egg to your fruit for breakfast    Comments  He has made several changes to his diet in recent years, including reducing soda intake to one can per day, eating less starch-heavy foods, reducing his sodium intake, eating less  red/fatty meats, eating no fried foods or desserts on a regular basis      Intervention Plan   Intervention  Prescribe, educate and counsel regarding individualized specific dietary modifications aiming towards targeted core components such as weight, hypertension, lipid management, diabetes, heart failure and other comorbidities.;Nutrition handout(s) given to patient. Nutrition guidelines for COPD    Expected Outcomes  Short Term Goal: A plan has been developed with personal nutrition goals set during dietitian appointment.;Short Term Goal: Understand basic principles of dietary content, such as calories, fat, sodium, cholesterol and nutrients.;Long Term Goal: Adherence to prescribed nutrition plan.       Nutrition Discharge: Nutrition Assessments - 12/03/17 1045      MEDFICTS Scores   Pre Score  50    Post Score  47    Score Difference  -3       Education Questionnaire Score: Knowledge Questionnaire Score - 12/03/17 1045      Knowledge Questionnaire Score   Pre Score  16/18    Post Score  18/18 reviewed with pt       Goals reviewed with patient; copy given to patient.

## 2017-12-17 NOTE — Progress Notes (Signed)
Discharge Progress Report  Patient Details  Name: Joe Torres MRN: [id number] Date of Birth: [birth year] Referring Provider:     Pulmonary Rehab from 08/19/2017 in [facility]  Referring Provider  Jeanne Ivan MD       Number of Visits: 36/36  Reason for Discharge:  Patient reached a stable level of exercise. Patient independent in their exercise. Patient has met program and personal goals.  Smoking History:  Social History   Tobacco Use  Smoking Status Former Smoker  . Packs/day: 1.50  . Years: 40.00  . Pack years: 60.00  . Types: Cigarettes  . Last attempt to quit: 05/21/2008  . Years since quitting: 9.5  Smokeless Tobacco Never Used    Diagnosis:  COPD with acute exacerbation ([place])  ADL UCSD: Pulmonary Assessment Scores    Row Name 08/19/17 1132 12/03/17 1043       ADL UCSD   ADL Phase  Entry  Exit    SOB Score total  92  88    Rest  2  1    Walk  3  3    Stairs  5  5    Bath  4  4    Dress  4  4    Shop  4  4      CAT Score   CAT Score  27  25      mMRC Score   mMRC Score  4  -       Initial Exercise Prescription: Initial Exercise Prescription - 08/19/17 1200      Date of Initial Exercise RX and Referring Provider   Date  08/19/17    Referring Provider  Jeanne Ivan MD      Treadmill   MPH  1.7    Grade  0.5    Minutes  15    METs  2.42      Recumbant Elliptical   Level  1    RPM  50    Minutes  15    METs  2.5      T5 Nustep   Level  1    SPM  80    Minutes  15    METs  2.5      Prescription Details   Frequency (times per week)  3    Duration  Progress to 45 minutes of aerobic exercise without signs/symptoms of physical distress      Intensity   THRR 40-80% of Max Heartrate  114-139    Ratings of Perceived Exertion  11-13    Perceived Dyspnea  0-4      Progression   Progression  Continue to progress workloads to maintain intensity without signs/symptoms of physical distress.      Resistance Training    Training Prescription  Yes    Weight  4lbs    Reps  10-15       Discharge Exercise Prescription (Final Exercise Prescription Changes): Exercise Prescription Changes - 12/10/17 1500      Response to Exercise   Blood Pressure (Admit)  134/64    Blood Pressure (Exit)  136/64    Heart Rate (Admit)  110 bpm    Heart Rate (Exercise)  81 bpm    Heart Rate (Exit)  75 bpm    Oxygen Saturation (Admit)  91 %    Oxygen Saturation (Exercise)  94 %    Oxygen Saturation (Exit)  95 %    Rating of Perceived Exertion (Exercise)  15    Perceived Dyspnea (Exercise)  3    Duration  Continue with 45 min of aerobic exercise without signs/symptoms of physical distress.    Intensity  THRR unchanged      Progression   Progression  Continue to progress workloads to maintain intensity without signs/symptoms of physical distress.    Average METs  2.4      Resistance Training   Training Prescription  Yes    Weight  4 lb    Reps  10-15      Interval Training   Interval Training  No      Treadmill   MPH  1.7    Grade  0.5    Minutes  15    METs  2.3      Recumbant Elliptical   Level  3    Minutes  15    METs  1.3      T5 Nustep   Level  3    Minutes  15    METs  1.8       Functional Capacity: 6 Minute Walk    Row Name 08/19/17 1242 12/10/17 1038       6 Minute Walk   Phase  Initial  Discharge    Distance  810 feet  1220 feet    Distance % Change  -  50.6 %    Distance Feet Change  -  410 ft    Walk Time  4.83 minutes  6 minutes    # of Rest Breaks  6 14 sec, 8 sec, 9 sec, 11 sec, 21 sec, 7 sec  0    MPH  1.91  2.3    METS  2.58  3.1    RPE  15  13    Perceived Dyspnea   4  3    VO2 Peak  9.02  10.9    Symptoms  Yes (comment)  No    Comments  SOB, hot in hallway at turn around  -    Resting HR  90 bpm  89 bpm    Resting BP  146/64  146/72    Resting Oxygen Saturation   94 %  93 %    Exercise Oxygen Saturation  during 6 min walk  87 %  87 %    Max Ex. HR  117 bpm  111 bpm     Max Ex. BP  164/74  142/70    2 Minute Post BP  156/62  136/64      Interval HR   1 Minute HR  107  105    2 Minute HR  109  105    3 Minute HR  114  104    4 Minute HR  115  101    5 Minute HR  116  111    6 Minute HR  117  108    2 Minute Post HR  108  101    Interval Heart Rate?  Yes  Yes      Interval Oxygen   Interval Oxygen?  Yes  Yes    Baseline Oxygen Saturation %  94 %  93 %    1 Minute Oxygen Saturation %  91 % rest 1:51-2:05  92 %    1 Minute Liters of Oxygen  0 L Room Air  0 L    2 Minute Oxygen Saturation %  88 % rest 2:36-2:44  89 %    2 Minute Liters of  Oxygen  0 L  0 L    3 Minute Oxygen Saturation %  87 % 3:12-3:21, 3:49-4:00  88 %    3 Minute Liters of Oxygen  0 L  0 L    4 Minute Oxygen Saturation %  88 % rest 4:39-5:00  88 %    4 Minute Liters of Oxygen  0 L  0 L    5 Minute Oxygen Saturation %  88 % rest 5:23-5:30  87 %    5 Minute Liters of Oxygen  0 L  0 L    6 Minute Oxygen Saturation %  89 %  88 %    6 Minute Liters of Oxygen  0 L  0 L    2 Minute Post Oxygen Saturation %  93 %  97 %    2 Minute Post Liters of Oxygen  0 L  0 L       Psychological, QOL, Others - Outcomes: PHQ 2/9: Depression screen PHQ 2/9 08/19/2017  Decreased Interest 2  Down, Depressed, Hopeless 1  PHQ - 2 Score 3  Altered sleeping 1  Tired, decreased energy 1  Change in appetite 0  Feeling bad or failure about yourself  0  Trouble concentrating 1  Moving slowly or fidgety/restless 0  Suicidal thoughts 0  PHQ-9 Score 6  Difficult doing work/chores Somewhat difficult    Quality of Life:   Personal Goals: Goals established at orientation with interventions provided to work toward goal. Personal Goals and Risk Factors at Admission - 08/19/17 1144      Core Components/Risk Factors/Patient Goals on Admission    Weight Management  Yes;Weight Maintenance;Weight Loss    Intervention  Weight Management: Develop a combined nutrition and exercise program designed to reach  desired caloric intake, while maintaining appropriate intake of nutrient and fiber, sodium and fats, and appropriate energy expenditure required for the weight goal.;Weight Management: Provide education and appropriate resources to help participant work on and attain dietary goals.;Weight Management/Obesity: Establish reasonable short term and long term weight goals.    Admit Weight  166 lb (75.3 kg)    Goal Weight: Short Term  161 lb (73 kg)    Goal Weight: Long Term  155 lb (70.3 kg)    Expected Outcomes  Short Term: Continue to assess and modify interventions until short term weight is achieved;Long Term: Adherence to nutrition and physical activity/exercise program aimed toward attainment of established weight goal;Weight Maintenance: Understanding of the daily nutrition guidelines, which includes 25-35% calories from fat, 7% or less cal from saturated fats, less than 235m cholesterol, less than 1.5gm of sodium, & 5 or more servings of fruits and vegetables daily;Understanding recommendations for meals to include 15-35% energy as protein, 25-35% energy from fat, 35-60% energy from carbohydrates, less than 2030mof dietary cholesterol, 20-35 gm of total fiber daily;Understanding of distribution of calorie intake throughout the day with the consumption of 4-5 meals/snacks;Weight Loss: Understanding of general recommendations for a balanced deficit meal plan, which promotes 1-2 lb weight loss per week and includes a negative energy balance of [phone] kcal/d    Improve shortness of breath with ADL's  Yes    Intervention  Provide education, individualized exercise plan and daily activity instruction to help decrease symptoms of SOB with activities of daily living.    Expected Outcomes  Short Term: Improve cardiorespiratory fitness to achieve a reduction of symptoms when performing ADLs;Long Term: Be able to perform more ADLs without symptoms or delay the onset  of symptoms    Hypertension  Yes     Intervention  Provide education on lifestyle modifcations including regular physical activity/exercise, weight management, moderate sodium restriction and increased consumption of fresh fruit, vegetables, and low fat dairy, alcohol moderation, and smoking cessation.;Monitor prescription use compliance. takes medication for blood pressure    Expected Outcomes  Short Term: Continued assessment and intervention until BP is < 140/42m HG in hypertensive participants. < 130/817mHG in hypertensive participants with diabetes, heart failure or chronic kidney disease.;Long Term: Maintenance of blood pressure at goal levels. at home 140-145/80's.    Lipids  Yes    Intervention  Provide education and support for participant on nutrition & aerobic/resistive exercise along with prescribed medications to achieve LDL <7068mHDL >67m95m  Expected Outcomes  Short Term: Participant states understanding of desired cholesterol values and is compliant with medications prescribed. Participant is following exercise prescription and nutrition guidelines.;Long Term: Cholesterol controlled with medications as prescribed, with individualized exercise RX and with personalized nutrition plan. Value goals: LDL < 70mg54mL > 40 mg.        Personal Goals Discharge: Goals and Risk Factor Review    Row Name 10/03/17 1201 11/05/17 1051 11/17/17 1101         Core Components/Risk Factors/Patient Goals Review   Personal Goals Review  Weight Management/Obesity;Improve shortness of breath with ADL's;Develop more efficient breathing techniques such as purse lipped breathing and diaphragmatic breathing and practicing self-pacing with activity.;Stress  Weight Management/Obesity;Improve shortness of breath with ADL's;Hypertension  Weight Management/Obesity;Improve shortness of breath with ADL's;Hypertension     Review  Pt states he can tell he is breathing better and able to walk further.  He has made small steps of progress and wants to  continue to get back to his normal.  He doesn't have any changes in stress levels.  Goerge states he is able to do more at home since his breathing is better. His weight has gone done to 160 pounds. His blood pressure has been going down since he started the program. He checks his blood pressure at home sometimes but needs to check it more often to make sure he is correlating to out measurements.  Tasean reports that his BP has been well under control and is taking all his meds as prescribed by his doctors. His weight has continued to decrease and he is now down to 158 lbs. He reports he is able to do more and be more active at home.      Expected Outcomes  Short - Pt will attend class regularly Long - Pt will improve oevrall MET level further  Short: check blood pressure at home. Long: maintain checking blood pressure at home once a day.  Short: Short term weight loss goal 155 lbs. Long: maintain acceptable blood pressures as he continues to monitor BP at home.         Exercise Goals and Review: Exercise Goals    Row Name 08/19/17 1251             Exercise Goals   Increase Physical Activity  Yes       Intervention  Provide advice, education, support and counseling about physical activity/exercise needs.;Develop an individualized exercise prescription for aerobic and resistive training based on initial evaluation findings, risk stratification, comorbidities and participant's personal goals.       Expected Outcomes  Short Term: Attend rehab on a regular basis to increase amount of physical activity.;Long Term: Add in home exercise to  make exercise part of routine and to increase amount of physical activity.;Long Term: Exercising regularly at least 3-5 days a week.       Increase Strength and Stamina  Yes       Intervention  Provide advice, education, support and counseling about physical activity/exercise needs.;Develop an individualized exercise prescription for aerobic and resistive training based on  initial evaluation findings, risk stratification, comorbidities and participant's personal goals.       Expected Outcomes  Short Term: Increase workloads from initial exercise prescription for resistance, speed, and METs.;Short Term: Perform resistance training exercises routinely during rehab and add in resistance training at home;Long Term: Improve cardiorespiratory fitness, muscular endurance and strength as measured by increased METs and functional capacity (6MWT)       Able to understand and use rate of perceived exertion (RPE) scale  Yes       Intervention  Provide education and explanation on how to use RPE scale       Expected Outcomes  Short Term: Able to use RPE daily in rehab to express subjective intensity level;Long Term:  Able to use RPE to guide intensity level when exercising independently       Able to understand and use Dyspnea scale  Yes       Intervention  Provide education and explanation on how to use Dyspnea scale       Expected Outcomes  Short Term: Able to use Dyspnea scale daily in rehab to express subjective sense of shortness of breath during exertion;Long Term: Able to use Dyspnea scale to guide intensity level when exercising independently       Knowledge and understanding of Target Heart Rate Range (THRR)  Yes       Intervention  Provide education and explanation of THRR including how the numbers were predicted and where they are located for reference       Expected Outcomes  Short Term: Able to state/look up THRR;Short Term: Able to use daily as guideline for intensity in rehab;Long Term: Able to use THRR to govern intensity when exercising independently       Able to check pulse independently  Yes       Intervention  Provide education and demonstration on how to check pulse in carotid and radial arteries.;Review the importance of being able to check your own pulse for safety during independent exercise       Expected Outcomes  Short Term: Able to explain why pulse  checking is important during independent exercise;Long Term: Able to check pulse independently and accurately       Understanding of Exercise Prescription  Yes       Intervention  Provide education, explanation, and written materials on patient's individual exercise prescription       Expected Outcomes  Short Term: Able to explain program exercise prescription;Long Term: Able to explain home exercise prescription to exercise independently          Nutrition & Weight - Outcomes: Pre Biometrics - 08/19/17 1252      Pre Biometrics   Height  5' 6.1" (1.679 m)    Weight  166 lb (75.3 kg)    Waist Circumference  39 inches    Hip Circumference  38 inches    Waist to Hip Ratio  1.03 %    BMI (Calculated)  26.71      Post Biometrics - 12/10/17 1038       Post  Biometrics   Height  5' 6.1" (1.679 m)  Weight  158 lb (71.7 kg)    Waist Circumference  38 inches    Hip Circumference  36 inches    Waist to Hip Ratio  1.06 %    BMI (Calculated)  25.42       Nutrition: Nutrition Therapy & Goals - 11/17/17 1125      Nutrition Therapy   Diet  TLC    Protein (specify units)  10oz    Fiber  30 grams    Whole Grain Foods  3 servings    Saturated Fats  13 max. grams    Fruits and Vegetables  8 servings/day eats vegetables with meals, fruits with and between meals daily    Sodium  2000 grams      Personal Nutrition Goals   Nutrition Goal  Try to avoid drinking fluids at meal times to improve breathing and decrease lethargy while eating    Personal Goal #2  Continue to limit sodium while cooking, treat sea salt the same as you would table salt in terms of restriction    Personal Goal #3  Consume a source of protein with each meal. For example, add an egg to your fruit for breakfast    Comments  He has made several changes to his diet in recent years, including reducing soda intake to one can per day, eating less starch-heavy foods, reducing his sodium intake, eating less red/fatty meats,  eating no fried foods or desserts on a regular basis      Intervention Plan   Intervention  Prescribe, educate and counsel regarding individualized specific dietary modifications aiming towards targeted core components such as weight, hypertension, lipid management, diabetes, heart failure and other comorbidities.;Nutrition handout(s) given to patient. Nutrition guidelines for COPD    Expected Outcomes  Short Term Goal: A plan has been developed with personal nutrition goals set during dietitian appointment.;Short Term Goal: Understand basic principles of dietary content, such as calories, fat, sodium, cholesterol and nutrients.;Long Term Goal: Adherence to prescribed nutrition plan.       Nutrition Discharge: Nutrition Assessments - 12/03/17 1045      MEDFICTS Scores   Pre Score  50    Post Score  47    Score Difference  -3       Education Questionnaire Score: Knowledge Questionnaire Score - 12/03/17 1045      Knowledge Questionnaire Score   Pre Score  16/18    Post Score  18/18 reviewed with pt       Goals reviewed with patient; copy given to patient.

## 2017-12-18 ENCOUNTER — Ambulatory Visit: Payer: Medicare HMO | Admitting: Physical Therapy

## 2017-12-18 ENCOUNTER — Encounter: Payer: Self-pay | Admitting: Physical Therapy

## 2017-12-18 DIAGNOSIS — M62838 Other muscle spasm: Secondary | ICD-10-CM | POA: Diagnosis not present

## 2017-12-18 DIAGNOSIS — M542 Cervicalgia: Secondary | ICD-10-CM

## 2017-12-18 NOTE — Therapy (Signed)
[facility] [address], [place], [address] Phone: [phone]   Fax:  [phone]  Physical Therapy Treatment  Patient Details  Name: Joe Torres MRN: [id number] Date of Birth: 02-Apr-1946 Referring Provider: Kingsley Spittle MD   Encounter Date: 12/18/2017  PT End of Session - 12/18/17 0955    Visit Number  12    Number of Visits  18    Date for PT Re-Evaluation  01/06/18    Authorization Type  2/10 progress note    PT Start Time  0950    PT Stop Time  1035    PT Time Calculation (min)  45 min    Activity Tolerance  Patient tolerated treatment well    Behavior During Therapy  [facility] for tasks assessed/performed       History reviewed. No pertinent past medical history.  History reviewed. No pertinent surgical history.  There were no vitals filed for this visit.  Subjective Assessment - 12/18/17 0955    Subjective  Patient reports 4/10 pain/tightness left side of neck and pain in left shoulder lateral aspect.     Pertinent History  Patient reports he began having neck pain about 6 weeks ago when he extended his right arm to reach for a tool in the back of his [employer] from the front seat and felt pain in his chest and back of his neck. Pain is worsening at this time and is radiating to the left shoulder, none in right shoulder.      Limitations  Lifting;Sitting;House hold activities;Other (comment)   turning head right/left   Patient Stated Goals  decrease pain in neck so he can return to PLOF    Currently in Pain?  Yes    Pain Score  4     Pain Location  Neck    Pain Orientation  Left    Pain Descriptors / Indicators  Tightness    Pain Type  Acute pain    Pain Onset  More than a month ago    Pain Frequency  Intermittent            Objective: Palpation: mild to moderate spasms and tenderness along upper trapezius and cervical spine paraspinal muscles left side > right AROM: cervical spine rotation  right and left decreased ~25% with pain reported with rotation bilaterally    Treatment:  Manual Therapy:  15 min: goal : spasms, pain, improve ROM cervical spine STM cervical spine suboccipital, paraspinal muscles superficial techniques; PA moblizaition cervical spine grade 1-2 x 3 reps each level; manual traction performed 5-10 reps for pain and spasms    Therapeutic exercise: Patient performed with demonstration, verbal cues of therapist: goal: independent with HEP and improve function with ADLs without stiffness/pain  Patient supine: Cervical spine retraction with isometric extension performed x 3 reps cervical spine isometric side bend x 5 reps with mild resistance of therapist, head in neutral posture/position cervical spine rotations x 3 reps, 2 sets in conjunction with STM   Modalities: Electrical stimulation: 15 min: High volt estim.clincial program for muscle spasms  (4) electrodes applied to bilateral posterior cervical spine paraspinal muscles and upper trapezius muscles over spasms with intensity to tolerance with patient in sitting position with UE's supported goal: pain, spasms Moist heat applied to both shoulders, cervical spine with estim. For pain, spasms, no adverse reactions noted    Patient response to treatment: Pain level decreased from 4/10 to < 3/10; patient required moderate VC  and repetition to perform exercises with improved technique; improved soft tissue noted by 50% with manual techniques     PT Education - 12/18/17 1028    Education Details  HEP for isometric lateral flexion cervical spine, extension isometrics supine lying x 5 reps, 5 second holds 1-2x/day    Person(s) Educated  Patient    Methods  Explanation;Demonstration;Verbal cues;Tactile cues    Comprehension  Verbalized understanding;Returned demonstration;Verbal cues required          PT Long Term Goals - 12/09/17 0954      PT LONG TERM GOAL #1   Title  Patient will report max pain level to  5/10 to allow improved function with daily activities involving neck     Baseline  intially 10/28/17: pain ranges from 7-10/10 and worse with turning head; 12/09/17: pain level ranges from 3-8/10    Status  On-going    Target Date  01/06/18      PT LONG TERM GOAL #2   Title  Patient will report max pain level to 3/10 to allow improved function with daily activities involving neck     Baseline  pain ranges from 7-10/10 and worse with turning head; max pain level 12/09/2017: 8/10    Status  On-going    Target Date  01/06/18      PT LONG TERM GOAL #3   Title  patient will be independent with home program for pain control and exercises for posture to allow self management of symptoms once discharged from physical therapy    Baseline  no knowledge of appropriate pain control strategies, posture correction or exercise progression without guided instruction and cuing    Status  On-going    Target Date  01/06/18      PT LONG TERM GOAL #4   Title  Patient will demonstrate improved function with daily activities with decreased pain intensity to mild with NDI score of 20% or less    Baseline  NDI 34%    Status  New    Target Date  01/06/18            Plan - 12/18/17 1024    Clinical Impression Statement  Patient demonstrated improved soft tissue elasticity with decreased spasms with treatment. He continues with pain and spasms left side cervical spine with rotation left and side bend right. He wll benefit from continued physical therapy intervention to address limitations in order to improve pain free function.     Rehab Potential  Good    Clinical Impairments Affecting Rehab Potential  (-)pain worsening with radating symptoms across back to left side/shoulder; multiple co morbidities    PT Frequency  2x / week    PT Duration  4 weeks    PT Treatment/Interventions  Iontophoresis 4mg /ml Dexamethasone;Cryotherapy;Electrical Stimulation;Ultrasound;Moist Heat;Therapeutic exercise;Therapeutic  activities;Neuromuscular re-education;Patient/family education;Manual techniques;Dry needling    PT Next Visit Plan  re assess ROM, function and continue with current treatment as indicated    PT Home Exercise Plan  posture awareness, correction, use of lumbar and cervical rolls for positioning sitting and sleeping; scapular retraction standing with resistive bands; cervical spine extension isometrics, UE endurance exercises       Patient will benefit from skilled therapeutic intervention in order to improve the following deficits and impairments:  Pain, Postural dysfunction, Increased muscle spasms, Decreased activity tolerance, Decreased endurance, Decreased range of motion, Impaired perceived functional ability, Impaired UE functional use  Visit Diagnosis: Other muscle spasm  Cervicalgia     Problem List There  are no active problems to display for this patient.   Jomarie Longs PT 12/19/2017, 9:30 AM  [facility] [address], [place], [address] Phone: [phone]   Fax:  [phone]  Name: Joe Torres MRN: [id number] Date of Birth: March 22, 1946

## 2017-12-23 ENCOUNTER — Encounter: Payer: Self-pay | Admitting: Physical Therapy

## 2017-12-23 ENCOUNTER — Ambulatory Visit: Payer: Medicare HMO | Admitting: Physical Therapy

## 2017-12-23 DIAGNOSIS — M542 Cervicalgia: Secondary | ICD-10-CM

## 2017-12-23 DIAGNOSIS — M62838 Other muscle spasm: Secondary | ICD-10-CM | POA: Diagnosis not present

## 2017-12-23 NOTE — Therapy (Signed)
[facility] [address] [place], [address] Phone: [phone]   Fax:  [phone]  Physical Therapy Treatment  Patient Details  Name: Joe Torres MRN: [id number] Date of Birth: 07-20-1945 Referring Provider: Kingsley Spittle MD   Encounter Date: 12/23/2017  PT End of Session - 12/23/17 1034    Visit Number  13    Number of Visits  18    Date for PT Re-Evaluation  01/06/18    Authorization Type  3/10 progress note    PT Start Time  0953    PT Stop Time  1035    PT Time Calculation (min)  42 min    Activity Tolerance  Patient tolerated treatment well    Behavior During Therapy  [facility] for tasks assessed/performed       History reviewed. No pertinent past medical history.  History reviewed. No pertinent surgical history.  There were no vitals filed for this visit.  Subjective Assessment - 12/23/17 0955    Subjective  Patient reports 4/10 pain/tightness left side of neck and overall is noticing improvement in ability to turn head in both directions with less difficulty.     Pertinent History  Patient reports he began having neck pain about 6 weeks ago when he extended his right arm to reach for a tool in the back of his [employer] from the front seat and felt pain in his chest and back of his neck. Pain is worsening at this time and is radiating to the left shoulder, none in right shoulder.      Limitations  Lifting;Sitting;House hold activities;Other (comment)   turning head right/left   Patient Stated Goals  decrease pain in neck so he can return to PLOF    Currently in Pain?  Yes    Pain Score  4     Pain Location  Neck    Pain Orientation  Left;Posterior    Pain Descriptors / Indicators  Aching;Tightness    Pain Type  Acute pain    Pain Onset  More than a month ago    Pain Frequency  Intermittent             Objective: Palpation: mild to moderate spasms and tenderness along upper trapezius and  cervical spine paraspinal muscles left side AROM: Cervical spine side bend right and left up to 40 degrees with decreased flexibility to right; rotations improving with less difficulty reported by patient    Treatment:  Manual Therapy: 10 min: goal : spasms, pain, improve ROM cervical spine STM cervical spine, upper trapezius muscles with patient seated in chair with UE's supported   Therapeutic exercise: Patient performed with demonstration, verbal cues of therapist: goal: independent with HEP and improve function with ADLs without stiffness/pain Cervical spine retraction with isometric extension performed x 3 reps upper trapezius stretch 3 x 15 seconds each right/left with patient seated in chair   Modalities: Electrical stimulation: 15 min: High volt estim.clincial program for muscle spasms  (4) electrodes applied to bilateral posterior cervical spine paraspinal muscles and upper trapezius muscles over spasms with intensity to tolerance with patient in sitting position with UE's supported goal: pain, spasms Moist heat applied to both shoulders, cervical spine with estim. For pain, spasms, no adverse reactions noted   US/estim combination:  10 min left side cerivcal spine and upper trapezius with focus on cervical spine with patient seated with left UE supported: 1MHz continuous @ 1.4 w/cm2 and HVGS intensity to  tolerance, contraction ~80 volts   Patient response to treatment: Pain level decreased from 4/10 to 2/10 with patient able to improve flexibility into side bending bilaterally       PT Education - 12/23/17 1202    Education Details  re assessed HEP with patient demonstrating exercises correctly    Person(s) Educated  Patient    Methods  Explanation    Comprehension  Verbalized understanding;Returned demonstration          PT Long Term Goals - 12/09/17 0954      PT LONG TERM GOAL #1   Title  Patient will report max pain level to 5/10 to allow improved function with  daily activities involving neck     Baseline  intially 10/28/17: pain ranges from 7-10/10 and worse with turning head; 12/09/17: pain level ranges from 3-8/10    Status  On-going    Target Date  01/06/18      PT LONG TERM GOAL #2   Title  Patient will report max pain level to 3/10 to allow improved function with daily activities involving neck     Baseline  pain ranges from 7-10/10 and worse with turning head; max pain level 12/09/2017: 8/10    Status  On-going    Target Date  01/06/18      PT LONG TERM GOAL #3   Title  patient will be independent with home program for pain control and exercises for posture to allow self management of symptoms once discharged from physical therapy    Baseline  no knowledge of appropriate pain control strategies, posture correction or exercise progression without guided instruction and cuing    Status  On-going    Target Date  01/06/18      PT LONG TERM GOAL #4   Title  Patient will demonstrate improved function with daily activities with decreased pain intensity to mild with NDI score of 20% or less    Baseline  NDI 34%    Status  New    Target Date  01/06/18            Plan - 12/23/17 1202    Clinical Impression Statement  Patient continues with steady progress with improvement noted in cervical spine rotation, improved spasms in cervical spine and function with daily tasks. He continues to benefit from physical therapy intervention to address deficits in order to achieve maximal function and transition to self management.     Rehab Potential  Good    Clinical Impairments Affecting Rehab Potential  (-)pain worsening with radating symptoms across back to left side/shoulder; multiple co morbidities    PT Frequency  2x / week    PT Duration  4 weeks    PT Treatment/Interventions  Iontophoresis 4mg /ml Dexamethasone;Cryotherapy;Electrical Stimulation;Ultrasound;Moist Heat;Therapeutic exercise;Therapeutic activities;Neuromuscular  re-education;Patient/family education;Manual techniques;Dry needling    PT Next Visit Plan  re assess ROM, function and continue with current treatment as indicated    PT Home Exercise Plan  posture awareness, correction, use of lumbar and cervical rolls for positioning sitting and sleeping; scapular retraction standing with resistive bands; cervical spine extension isometrics, UE endurance exercises       Patient will benefit from skilled therapeutic intervention in order to improve the following deficits and impairments:  Pain, Postural dysfunction, Increased muscle spasms, Decreased activity tolerance, Decreased endurance, Decreased range of motion, Impaired perceived functional ability, Impaired UE functional use  Visit Diagnosis: Other muscle spasm  Cervicalgia     Problem List There are no active problems  to display for this patient.   Jomarie Longs PT 12/23/2017, 12:06 PM  [facility] [address] [place], [address] Phone: [phone]   Fax:  [phone]  Name: Joe Torres MRN: [id number] Date of Birth: 1945-07-04

## 2017-12-25 ENCOUNTER — Ambulatory Visit: Payer: Medicare HMO | Admitting: Physical Therapy

## 2017-12-25 ENCOUNTER — Encounter: Payer: Self-pay | Admitting: Physical Therapy

## 2017-12-25 DIAGNOSIS — M62838 Other muscle spasm: Secondary | ICD-10-CM | POA: Diagnosis not present

## 2017-12-25 DIAGNOSIS — M542 Cervicalgia: Secondary | ICD-10-CM

## 2017-12-25 NOTE — Therapy (Signed)
[facility] [address], [place], [address] Phone: [phone]   Fax:  [phone]  Physical Therapy Treatment  Patient Details  Name: Joe Torres MRN: [id number] Date of Birth: [birth year] Referring Provider: Kingsley Spittle MD   Encounter Date: 12/25/2017  PT End of Session - 12/26/17 0816    Visit Number  14    Number of Visits  18    Date for PT Re-Evaluation  01/06/18    Authorization Type  4/10 progress note    PT Start Time  0950    PT Stop Time  1033    PT Time Calculation (min)  43 min    Activity Tolerance  Patient tolerated treatment well    Behavior During Therapy  [facility] for tasks assessed/performed       History reviewed. No pertinent past medical history.  History reviewed. No pertinent surgical history.  There were no vitals filed for this visit.  Subjective Assessment - 12/25/17 0957    Subjective  Patient reports he is doing better with feeling looser in his neck with turning to left/ right and side bending and is sleeping with less difficutly. He has 3/10 tightness left side of neck. He states he is ~ 80% improved since beginning physical therapy and is compliant with HEP as instructed.     Pertinent History  Patient reports he began having neck pain about 6 weeks ago when he extended his right arm to reach for a tool in the back of his [employer] from the front seat and felt pain in his chest and back of his neck. Pain is worsening at this time and is radiating to the left shoulder, none in right shoulder.      Limitations  Lifting;Sitting;House hold activities;Other (comment)   turning head right/left   Patient Stated Goals  decrease pain in neck so he can return to PLOF    Currently in Pain?  Yes    Pain Score  3     Pain Location  Neck    Pain Orientation  Left    Pain Descriptors / Indicators  Tightness    Pain Type  Acute pain    Pain Onset  More than a month ago    Pain Frequency   Intermittent         Objective: Palpation: mild to moderate spasms and tenderness along upper trapezius and cervical spine paraspinal muscles and SCM left side AROM: Cervical spine side bend right and left up to 40/45 left degrees with decreased flexibility to right; rotations right 30/40; left 40/45    Treatment:  Manual Therapy: 15 min: goal : spasms, pain, improve ROM cervical spine STM with superficial, compression techniques with focus on left SCM/cervical spine and suboccipital muscles with patient seated in chair with UE's supported    Modalities: US/estim combination:  10 min left side cerivcal spine and upper trapezius with focus on cervical spine with patient seated with left UE supported: 1MHz continuous @ 1.4 w/cm2 and HVGS intensity to tolerance, contraction ~80 volts  Electrical stimulation: 13 min: High volt estim.clincial program for muscle spasms  (4) electrodes applied to bilateral posterior cervical spine paraspinal muscles and upper trapezius muscles over spasms with intensity to tolerance with patient in sitting position with UE's supported goal: pain, spasms Moist heat applied to both shoulders, cervical spine with estim. For pain, spasms, no adverse reactions noted     Patient response to treatment: Improved ROM cervical  spine into lateral flexion and side bending with reported decreased tightness from 3/10 to very mid following treatment.      PT Education - 12/25/17 1014    Education Details  progress with goals, ROM and re assessed HEP for correct technqiue with cervical retraction and rotation     Person(s) Educated  Patient    Methods  Explanation;Demonstration;Verbal cues    Comprehension  Verbalized understanding;Returned demonstration;Verbal cues required          PT Long Term Goals - 12/09/17 0954      PT LONG TERM GOAL #1   Title  Patient will report max pain level to 5/10 to allow improved function with daily activities involving neck      Baseline  intially 10/28/17: pain ranges from 7-10/10 and worse with turning head; 12/09/17: pain level ranges from 3-8/10    Status  On-going    Target Date  01/06/18      PT LONG TERM GOAL #2   Title  Patient will report max pain level to 3/10 to allow improved function with daily activities involving neck     Baseline  pain ranges from 7-10/10 and worse with turning head; max pain level 12/09/2017: 8/10    Status  On-going    Target Date  01/06/18      PT LONG TERM GOAL #3   Title  patient will be independent with home program for pain control and exercises for posture to allow self management of symptoms once discharged from physical therapy    Baseline  no knowledge of appropriate pain control strategies, posture correction or exercise progression without guided instruction and cuing    Status  On-going    Target Date  01/06/18      PT LONG TERM GOAL #4   Title  Patient will demonstrate improved function with daily activities with decreased pain intensity to mild with NDI score of 20% or less    Baseline  NDI 34%    Status  New    Target Date  01/06/18            Plan - 12/25/17 1030    Clinical Impression Statement  Patient continues steady progress towards goals with improving flexibility iin cervical spine and improved sleep. He continues with spasm, decreased ROM cervical spine and should continue to improve with additional physical therapy intervention. .    Rehab Potential  Good    Clinical Impairments Affecting Rehab Potential  (-)pain worsening with radating symptoms across back to left side/shoulder; multiple co morbidities    PT Frequency  2x / week    PT Duration  4 weeks    PT Treatment/Interventions  Iontophoresis 4mg /ml Dexamethasone;Cryotherapy;Electrical Stimulation;Ultrasound;Moist Heat;Therapeutic exercise;Therapeutic activities;Neuromuscular re-education;Patient/family education;Manual techniques;Dry needling    PT Next Visit Plan  re assess ROM, function and  continue with current treatment as indicated    PT Home Exercise Plan  posture awareness, correction, use of lumbar and cervical rolls for positioning sitting and sleeping; scapular retraction standing with resistive bands; cervical spine extension isometrics, UE endurance exercises       Patient will benefit from skilled therapeutic intervention in order to improve the following deficits and impairments:  Pain, Postural dysfunction, Increased muscle spasms, Decreased activity tolerance, Decreased endurance, Decreased range of motion, Impaired perceived functional ability, Impaired UE functional use  Visit Diagnosis: Other muscle spasm  Cervicalgia     Problem List There are no active problems to display for this patient.   Jomarie Longs  PT 12/26/2017, 8:20 AM  [facility] [address], [place], [address] Phone: [phone]   Fax:  [phone]  Name: KAEVION SINCLAIR MRN: [id number] Date of Birth: 11-Jul-1945

## 2017-12-30 ENCOUNTER — Ambulatory Visit: Payer: Medicare HMO

## 2017-12-30 DIAGNOSIS — M542 Cervicalgia: Secondary | ICD-10-CM

## 2017-12-30 DIAGNOSIS — M62838 Other muscle spasm: Secondary | ICD-10-CM | POA: Diagnosis not present

## 2017-12-30 NOTE — Therapy (Signed)
[facility] [address] [place], [address] Phone: [phone]   Fax:  [phone]  Physical Therapy Treatment  Patient Details  Name: Joe Torres MRN: [id number] Date of Birth: 01-27-1946 Referring Provider: Kingsley Spittle MD   Encounter Date: 12/30/2017  PT End of Session - 12/30/17 1035    Visit Number  15    Number of Visits  18    Date for PT Re-Evaluation  01/06/18    Authorization Type  5/10 progress note    PT Start Time  0946    PT Stop Time  1039    PT Time Calculation (min)  53 min    Activity Tolerance  Patient tolerated treatment well    Behavior During Therapy  [facility] for tasks assessed/performed       Subjective Assessment - 12/30/17 0948    Subjective  Patient states that his is having some pain today, states last time he left PT he was feeling good, but the day after his pain increased. Reports its always on his L side.     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 Pain is worsening at this time and is radiating to the left shoulder, none in right shoulder.      Limitations  Lifting;Sitting;House hold activities;Other (comment)   turning head L/R   Patient Stated Goals  decrease pain in neck so he can return to PLOF    Pain Score  5     Pain Location  Neck    Pain Orientation  Left    Pain Descriptors / Indicators  Aching;Tightness    Pain Type  Acute pain    Pain Onset  More than a month ago      Objective: Palpation: mild to moderate spasms and tenderness along L upper trapezius and cervical spine paraspinal muscles and SCM left side  Treatment:  Therapeutic Exercises: goal: To improve ROM, increase tissue extensibility Levator scapulae and UT stretches on L with  PT assist 3x30", mild reports of pain when returning to neutral position Cervical rotation with assist from towel (SNAGs) to promote increased cervical ROM, 7x5" b/l  Manual Therapy: 15 min: goal : spasms, pain, improve ROM cervical spine STM with trigger point release to L UT/levator scap, suboccipital muscles with patient seated in chair with UE's supported   Modalities: Electrical stimulation: 10 min: High volt estim.clincial program for muscle spasms  (4) electrodes applied to bilateral posterior cervical spine paraspinal muscles and upper trapezius muscles over spasms with intensity to tolerance with patient in sitting position with UE's supported goal: pain, spasms Moist heat applied to both shoulders, cervical spine with estim. For pain, spasms, no adverse reactions noted   Patient response to treatment: Improved ROM cervical spine rotation and sidebending with reported decreased tightness from 4/10.    PT Long Term Goals - 12/09/17 0954      PT LONG TERM GOAL #1   Title  Patient will report max pain level to 5/10 to allow improved function with daily activities involving neck     Baseline  intially 10/28/17: pain ranges from 7-10/10 and worse with turning head; 12/09/17: pain level ranges from 3-8/10  Status  On-going    Target Date  01/06/18      PT LONG TERM GOAL #2   Title  Patient will report max pain level to 3/10 to allow improved function with daily activities involving neck     Baseline  pain ranges from 7-10/10 and worse with turning head; max pain level 12/09/2017: 8/10    Status  On-going    Target Date  01/06/18      PT LONG TERM GOAL #3   Title  patient will be independent with home program for pain control and exercises for posture to allow self management of symptoms once discharged from physical therapy    Baseline  no knowledge of appropriate pain control strategies, posture correction or exercise progression without guided instruction and cuing    Status   On-going    Target Date  01/06/18      PT LONG TERM GOAL #4   Title  Patient will demonstrate improved function with daily activities with decreased pain intensity to mild with NDI score of 20% or less    Baseline  NDI 34%    Status  New    Target Date  01/06/18            Plan - 12/30/17 1033    Clinical Impression Statement  Patient demonstrated improved cervical rotation after therapeutic exercises. Reports improved pain at end of session. The patient would benefit from further skilled PT to continue to address limitations and progress towards goals.    Clinical Impairments Affecting Rehab Potential  (-)pain worsening with radating symptoms across back to left side/shoulder; multiple co morbidities    PT Frequency  2x / week    PT Duration  4 weeks    PT Treatment/Interventions  Iontophoresis 4mg /ml Dexamethasone;Cryotherapy;Electrical Stimulation;Ultrasound;Moist Heat;Therapeutic exercise;Therapeutic activities;Neuromuscular re-education;Patient/family education;Manual techniques;Dry needling    PT Next Visit Plan  re assess ROM, function and continue with current treatment as indicated    PT Home Exercise Plan  posture awareness, correction, use of lumbar and cervical rolls for positioning sitting and sleeping; scapular retraction standing with resistive bands; cervical spine extension isometrics, UE endurance exercises    Consulted and Agree with Plan of Care  Patient       Patient will benefit from skilled therapeutic intervention in order to improve the following deficits and impairments:  Pain, Postural dysfunction, Increased muscle spasms, Decreased activity tolerance, Decreased endurance, Decreased range of motion, Impaired perceived functional ability, Impaired UE functional use  Visit Diagnosis: Other muscle spasm  Cervicalgia    Problem List There are no active problems to display for this patient.    Lieutenant Diego PT, DPT 10:51  AM,12/30/17 [facility] [address], [place], [address] Phone: [phone]   Fax:  [phone]  Name: Joe Torres MRN: [id number] Date of Birth: 1945/07/21

## 2018-01-01 ENCOUNTER — Ambulatory Visit: Payer: Medicare HMO

## 2018-01-01 DIAGNOSIS — M62838 Other muscle spasm: Secondary | ICD-10-CM

## 2018-01-01 DIAGNOSIS — M542 Cervicalgia: Secondary | ICD-10-CM

## 2018-01-01 NOTE — Therapy (Signed)
[facility] [address], [place], [address] Phone: [phone]   Fax:  [phone]  Physical Therapy Treatment  Patient Details  Name: Joe Torres MRN: [id number] Date of Birth: [birth year] Referring Provider: Kingsley Spittle MD   Encounter Date: 01/01/2018  PT End of Session - 01/01/18 1034    Visit Number  16    Number of Visits  18    Date for PT Re-Evaluation  01/06/18    Authorization Type  6/10    PT Start Time  0945    PT Stop Time  1040    PT Time Calculation (min)  55 min    Activity Tolerance  Patient tolerated treatment well    Behavior During Therapy  [facility] for tasks assessed/performed        Subjective Assessment - 01/01/18 0947    Subjective  Patient reports that he felt good when he left here for the rest of the day. States his pain was a 4/10 the next day.     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 Pain is worsening at this time and is radiating to the left shoulder, none in right shoulder.      Limitations  Lifting;Sitting;House hold activities;Other (comment)    Patient Stated Goals  decrease pain in neck so he can return to PLOF    Currently in Pain?  Yes    Pain Score  3     Pain Location  --   shoulder and neck today   Pain Orientation  Left    Pain Onset  More than a month ago       Objective: Palpation: mild to moderate spasms and tenderness along L upper trapezius and cervical spine paraspinal muscles and SCM left side   Treatment:  Therapeutic Exercises: goal: To improve ROM, increase tissue extensibility In supine: Levator scapulae and UT stretches on L with PT assist 3x30", mild reports of pain when returning to neutral position Cervical rotation with  assist from towel (SNAGs) to promote increased cervical ROM, 8x5" b/l with extensive tactile cues    Manual Therapy: 25 min: goal : spasms, pain, improve ROM cervical spine STM with trigger point release to L UT/levator scap, suboccipital muscles with patient in supine, mobilizations with movement of cervical rotations, gentle PA mobs   Modalities: Electrical stimulation: 10 min: High volt estim.clincial program for muscle spasms  (4) electrodes applied to bilateral posterior cervical spine paraspinal muscles and upper trapezius muscles over spasms with intensity to tolerance with patient in sitting position with UE's supported goal: pain, spasms Moist heat applied to both shoulders, cervical spine with estim. For pain, spasms, no adverse reactions noted   Patient response to treatment: Improved ROM cervical spine rotation with reported decreased tightness from 4/10 to 3/10.   PT Education - 01/01/18 1032    Education Details  educated about posture, condition    Person(s) Educated  Patient    Methods  Explanation;Demonstration    Comprehension  Verbalized understanding          PT Long Term Goals - 12/09/17 0954      PT LONG TERM GOAL #1   Title  Patient  will report max pain level to 5/10 to allow improved function with daily activities involving neck     Baseline  intially 10/28/17: pain ranges from 7-10/10 and worse with turning head; 12/09/17: pain level ranges from 3-8/10    Status  On-going    Target Date  01/06/18      PT LONG TERM GOAL #2   Title  Patient will report max pain level to 3/10 to allow improved function with daily activities involving neck     Baseline  pain ranges from 7-10/10 and worse with turning head; max pain level 12/09/2017: 8/10    Status  On-going    Target Date  01/06/18      PT LONG TERM GOAL #3   Title  patient will be independent with home program for pain control and exercises for posture to allow self management of symptoms once discharged from  physical therapy    Baseline  no knowledge of appropriate pain control strategies, posture correction or exercise progression without guided instruction and cuing    Status  On-going    Target Date  01/06/18      PT LONG TERM GOAL #4   Title  Patient will demonstrate improved function with daily activities with decreased pain intensity to mild with NDI score of 20% or less    Baseline  NDI 34%    Status  New    Target Date  01/06/18            Plan - 01/01/18 1033    Clinical Impression Statement  Patient demonstrates improved cervical rotation post STM and mobilization with movement, though still complains of pain with returning to neutral positioning.    Clinical Impairments Affecting Rehab Potential  (-)pain worsening with radating symptoms across back to left side/shoulder; multiple co morbidities    PT Frequency  2x / week    PT Duration  4 weeks    PT Treatment/Interventions  Iontophoresis 4mg /ml Dexamethasone;Cryotherapy;Electrical Stimulation;Ultrasound;Moist Heat;Therapeutic exercise;Therapeutic activities;Neuromuscular re-education;Patient/family education;Manual techniques;Dry needling    PT Next Visit Plan  re assess ROM, function and continue with current treatment as indicated    PT Home Exercise Plan  posture awareness, correction, use of lumbar and cervical rolls for positioning sitting and sleeping; scapular retraction standing with resistive bands; cervical spine extension isometrics, UE endurance exercises    Consulted and Agree with Plan of Care  Patient       Patient will benefit from skilled therapeutic intervention in order to improve the following deficits and impairments:  Pain, Postural dysfunction, Increased muscle spasms, Decreased activity tolerance, Decreased endurance, Decreased range of motion, Impaired perceived functional ability, Impaired UE functional use  Visit Diagnosis: Other muscle spasm  Cervicalgia     Problem List There are no active  problems to display for this patient.   Lieutenant Diego PT, DPT 11:15 AM,01/01/18 [phone]  [facility] [address], [place], [address] Phone: [phone]   Fax:  [phone]  Name: Joe Torres MRN: [id number] Date of Birth: [birth year]

## 2018-01-06 ENCOUNTER — Ambulatory Visit: Payer: Medicare HMO

## 2018-01-06 DIAGNOSIS — M62838 Other muscle spasm: Secondary | ICD-10-CM

## 2018-01-06 DIAGNOSIS — M542 Cervicalgia: Secondary | ICD-10-CM

## 2018-01-06 NOTE — Therapy (Addendum)
[facility] [address] [place], [address] Phone: [phone]   Fax:  [phone]  Physical Therapy Treatment  And  Progress Report  Patient Details  Name: Joe Torres MRN: [id number] Date of Birth: 1945/12/28 Referring Provider: Kingsley Spittle MD   Encounter Date: 01/06/2018  PT End of Session - 01/06/18 1031    Visit Number  17    Number of Visits  18    Date for PT Re-Evaluation  02/03/18    Authorization Type  7/10    PT Start Time  0945    PT Stop Time  1032    PT Time Calculation (min)  47 min    Activity Tolerance  Patient tolerated treatment well    Behavior During Therapy  [facility] for tasks assessed/performed        Subjective Assessment - 01/06/18 1025    Subjective  Patient reports his L sided neck pain is 5/10 today. States he felt good after therapy but the next day his pain returned.    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 Pain is worsening at this time and is radiating to the left shoulder, none in right shoulder.      Limitations  Lifting;Sitting;House hold activities;Other (comment)    Patient Stated Goals  decrease pain in neck so he can return to PLOF    Pain Score  5     Pain Location  Neck    Pain Orientation  Left    Pain Descriptors / Indicators  Aching;Tender;Tightness    Pain Onset  More than a month ago    Aggravating Factors   rotation of head right/left      Objective: Palpation: mild to moderate spasms and tenderness along L upper trapezius and cervical spine paraspinal muscles and SCM left side, posterior cervical paraspinals on L reproduces concordant pain.  Cervical rotation to L: ~60% and painful Cervical rotation to R: ~70% slight  pain Cervical flex and extension WFLs, increased pain with cervical flexion UE ROM WFLs, L shoulder pain with abduction   Treatment:  Therapeutic Exercises: goal: To improve ROM, increase tissue extensibility In supine: cervical rotation with manual assist from PT with mild reports of pain when returning to neutral position Cervical rotation with assist from towel (SNAGs) to promote increased cervical ROM, 8x5" b/l with extensive tactile cues   Manual Therapy: 25 min: goal : spasms, pain, improve ROM cervical spine STM with trigger point release to L UT/levator scap, suboccipital muscles with patient in supine, mobilizations with movement of cervical rotations, gentle PA mobs   Modalities: Electrical stimulation: 10 min: High volt estim.clincial program for muscle spasms  (4) electrodes applied to bilateral posterior cervical spine paraspinal muscles and upper trapezius muscles over spasms with intensity to tolerance with patient in sitting position with UE's supported goal: pain, spasms Moist heat applied to both shoulders, cervical spine with estim. For pain, spasms, no adverse reactions noted Patient response to treatment: Improved ROM cervical spine rotation with reported decreased tightness from 5/10 to 2/10.   PT Education - 01/06/18 1030    Education Details  educated about condition, ther ex, HEP importance    Person(s) Educated  Patient    Methods  Explanation;Demonstration          PT Long Term Goals - 01/06/18 1159      PT LONG TERM GOAL #1   Title  Patient will report max pain level to 5/10 to allow improved function with daily activities involving neck     Baseline  intially 10/28/17: pain ranges from 7-10/10 and worse with turning head; 12/09/17: pain level ranges from 3-8/10 01/06/09 3-6/10    Time  4    Period  Weeks    Status  On-going    Target Date  02/03/18      PT LONG TERM GOAL #2   Title  Patient will report max pain level to 3/10 to allow improved function  with daily activities involving neck     Baseline  pain ranges from 7-10/10 and worse with turning head; max pain level 12/09/2017: 8/10 : pain ranges from 3-6/10 01/06/18    Time  4    Period  Weeks    Status  On-going    Target Date  02/03/18      PT LONG TERM GOAL #3   Title  patient will be independent with home program for pain control and exercises for posture to allow self management of symptoms once discharged from physical therapy    Baseline  Reports some compliance with exercises at home, final HEP not completed yet    Time  4    Period  Weeks    Status  On-going    Target Date  02/03/18      PT LONG TERM GOAL #4   Title  Patient will demonstrate improved function with daily activities with decreased pain intensity to mild with NDI score of 20% or less    Baseline  NDI 34%    Time  4    Period  Weeks    Status  On-going    Target Date  02/03/18      PT LONG TERM GOAL #5   Title  Patient will demonstrate cervical rotation with pain 0-3/10 at most to promote return to PLOF, improve ease with functional activities such as driving.     Time  4    Period  Weeks    Status  New    Target Date  02/03/18            Plan - 01/06/18 1200    Clinical Impression Statement  Patient continues to demonstrate improvement in cervical rotation post session as well as decreased pain. Overall patient still demonstrates limitations in cervical ROM, decrease in functional activities compared to PLOF, and pain with cervical mobility. Patient also continues to present with TTP and trigger points of L upper quadrant. The patient would benefit from further skilled PT to continue to progress towards goals and return patient to PLOF.     History and Personal Factors relevant to plan of care:  Patient reports he began having neck pain about 6 weeks when he extended his right arm to reach for a tool in back of his [employer] from the front seat and felt pain in his chest and back of his neck. Pain is  worsening at this time and is radiating to left shoulder, none in right shoulder. Has had decrease in pain overall with therapy but still complains of cervical pain with ADLs, sleeping, and functional activities.     Clinical Impairments Affecting Rehab Potential  (-)pain worsening with radating symptoms across back to left side/shoulder;  multiple co morbidities    PT Frequency  2x / week    PT Duration  4 weeks    PT Treatment/Interventions  Iontophoresis 4mg /ml Dexamethasone;Cryotherapy;Electrical Stimulation;Ultrasound;Moist Heat;Therapeutic exercise;Therapeutic activities;Neuromuscular re-education;Patient/family education;Manual techniques;Dry needling    PT Next Visit Plan  re assess ROM, function and continue with current treatment as indicated    PT Home Exercise Plan  posture awareness, correction, use of lumbar and cervical rolls for positioning sitting and sleeping; scapular retraction standing with resistive bands; cervical spine extension isometrics, UE endurance exercises    Consulted and Agree with Plan of Care  Patient       Patient will benefit from skilled therapeutic intervention in order to improve the following deficits and impairments:  Pain, Postural dysfunction, Increased muscle spasms, Decreased activity tolerance, Decreased endurance, Decreased range of motion, Impaired perceived functional ability, Impaired UE functional use  Visit Diagnosis: Other muscle spasm  Cervicalgia    Lieutenant Diego PT, DPT 12:19 PM,01/06/18 [facility] [address] [place], [address] Phone: [phone]   Fax:  [phone]  Name: KIWAN GADSDEN MRN: [id number] Date of Birth: 02/14/1946

## 2018-01-08 ENCOUNTER — Ambulatory Visit: Payer: Medicare HMO

## 2018-01-08 DIAGNOSIS — M62838 Other muscle spasm: Secondary | ICD-10-CM

## 2018-01-08 DIAGNOSIS — M542 Cervicalgia: Secondary | ICD-10-CM

## 2018-01-08 NOTE — Therapy (Signed)
[facility] [address] [place], [address] Phone: [phone]   Fax:  [phone]  Physical Therapy Treatment  Patient Details  Name: Joe Torres MRN: [id number] Date of Birth: 12/15/1945 Referring Provider: Kingsley Spittle MD   Encounter Date: 01/08/2018  PT End of Session - 01/08/18 1030    Visit Number  18    Number of Visits  27    Date for PT Re-Evaluation  02/03/18    Authorization Type  7/67 (recert and reassessed 3/41/93) 1/10?    PT Start Time  0945    PT Stop Time  1030    PT Time Calculation (min)  45 min    Activity Tolerance  Patient tolerated treatment well    Behavior During Therapy  [facility] for tasks assessed/performed       History reviewed. No pertinent past medical history.  History reviewed. No pertinent surgical history.  There were no vitals filed for this visit.  Subjective Assessment - 01/08/18 0948    Subjective  Patient reports that his pain is a little bit better today. states it is about a 4/10.     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 Pain is worsening at this time and is radiating to the left shoulder, none in right shoulder.      Patient Stated Goals  decrease pain in neck so he can return to PLOF    Currently in Pain?  Yes    Pain Location  Neck   pulling   Pain Orientation  Left    Pain Type  Acute pain    Pain Onset  More than a month ago       Objective: Palpation: mild to moderate spasms and tenderness along L upper trapezius and cervical spine paraspinal muscles and SCM left side, posterior cervical paraspinals on L reproduces concordant pain.   Treatment:  Therapeutic Exercises: goal: To improve ROM, increase tissue extensibility In  supine:?cervical rotation with manual assist from PT with?mild reports of pain when returning to neutral position Cervical rotation with assist from towel (SNAGs) to promote increased cervical ROM, 8x5" b/l with extensive tactile cues Levator scap and UT stretches with PT assist on L 2x30" ea Taught STM with golf ball for home use, demonstrated ~100mins  Manual Therapy: 25 min: goal : spasms, pain, improve ROM cervical spine STM, IASTM,  with trigger point release to L UT/levator scap, suboccipital muscles with patient in sitting, mobilizations with movement of cervical rotation   Patient response to treatment: Improved ROM cervical spine rotation with reported decreased tightness from 4/10 to 3/10.     PT Education - 01/08/18 1029    Education Details  educated about condition, HEP update    Person(s) Educated  Patient    Methods  Explanation;Demonstration    Comprehension  Verbalized understanding;Returned demonstration          PT Long Term Goals - 01/06/18 1159      PT LONG TERM GOAL #1   Title  Patient will report max pain level to 5/10 to allow improved function with daily activities involving neck     Baseline  intially 10/28/17: pain ranges from 7-10/10 and  worse with turning head; 12/09/17: pain level ranges from 3-8/10 01/06/09 3-6/10    Time  4    Period  Weeks    Status  On-going    Target Date  02/03/18      PT LONG TERM GOAL #2   Title  Patient will report max pain level to 3/10 to allow improved function with daily activities involving neck     Baseline  pain ranges from 7-10/10 and worse with turning head; max pain level 12/09/2017: 8/10 : pain ranges from 3-6/10 01/06/18    Time  4    Period  Weeks    Status  On-going    Target Date  02/03/18      PT LONG TERM GOAL #3   Title  patient will be independent with home program for pain control and exercises for posture to allow self management of symptoms once discharged from physical therapy    Baseline  Reports  some compliance with exercises at home, final HEP not completed yet    Time  4    Period  Weeks    Status  On-going    Target Date  02/03/18      PT LONG TERM GOAL #4   Title  Patient will demonstrate improved function with daily activities with decreased pain intensity to mild with NDI score of 20% or less    Baseline  NDI 34%    Time  4    Period  Weeks    Status  On-going    Target Date  02/03/18      PT LONG TERM GOAL #5   Title  Patient will demonstrate cervical rotation with pain 0-3/10 at most to promote return to PLOF, improve ease with functional activities such as driving.     Time  4    Period  Weeks    Status  New    Target Date  02/03/18            Plan - 01/08/18 1029    Clinical Impression Statement  Patient demonstrated improved cervical rotation post session as well as improved pain from 4/10 to 3/10. 1 cavitation noted in C spine with mobilization with movement. Patient instructed in STM at home with golf ball/tennis ball to improve carry over of relief of symptoms.     Rehab Potential  Good    Clinical Impairments Affecting Rehab Potential  (-)pain worsening with radating symptoms across back to left side/shoulder; multiple co morbidities    PT Frequency  2x / week    PT Duration  4 weeks    PT Treatment/Interventions  Iontophoresis 4mg /ml Dexamethasone;Cryotherapy;Electrical Stimulation;Ultrasound;Moist Heat;Therapeutic exercise;Therapeutic activities;Neuromuscular re-education;Patient/family education;Manual techniques;Dry needling    PT Next Visit Plan  re assess ROM, function and continue with current treatment as indicated    PT Home Exercise Plan  posture awareness, correction, use of lumbar and cervical rolls for positioning sitting and sleeping; scapular retraction standing with resistive bands; cervical spine extension isometrics, UE endurance exercises    Consulted and Agree with Plan of Care  Patient       Patient will benefit from skilled  therapeutic intervention in order to improve the following deficits and impairments:  Pain, Postural dysfunction, Increased muscle spasms, Decreased activity tolerance, Decreased endurance, Decreased range of motion, Impaired perceived functional ability, Impaired UE functional use  Visit Diagnosis: Other muscle spasm  Cervicalgia     Problem List There are no active problems to display for this patient.  Lieutenant Diego PT, DPT 11:01 AM,01/08/18 [place] [facility] [address], [place], [address] Phone: [phone]   Fax:  [phone]  Name: PATRIK TURNBAUGH MRN: [id number] Date of Birth: [birth year]

## 2018-01-14 ENCOUNTER — Ambulatory Visit: Payer: Medicare HMO | Attending: Internal Medicine

## 2018-01-14 DIAGNOSIS — M62838 Other muscle spasm: Secondary | ICD-10-CM | POA: Diagnosis not present

## 2018-01-14 DIAGNOSIS — M542 Cervicalgia: Secondary | ICD-10-CM | POA: Diagnosis present

## 2018-01-14 NOTE — Therapy (Signed)
[facility] [address] [place], [address] Phone: [phone]   Fax:  [phone]  Physical Therapy Treatment  Patient Details  Name: Joe Torres MRN: [id number] Date of Birth: 1946-04-09 Referring Provider: Kingsley Spittle MD   Encounter Date: 01/14/2018  PT End of Session - 01/14/18 1429    Visit Number  19    Number of Visits  27    Date for PT Re-Evaluation  02/03/18    Authorization Type  6/26 (recert and reassessed 9/48/54) 2/10?    PT Start Time  1345    PT Stop Time  1426    PT Time Calculation (min)  41 min    Activity Tolerance  Patient tolerated treatment well    Behavior During Therapy  [facility] for tasks assessed/performed       History reviewed. No pertinent past medical history.  History reviewed. No pertinent surgical history.  There were no vitals filed for this visit.  Subjective Assessment - 01/14/18 1346    Subjective  Patient reports that his pain is a little better today. States that the first 3 days after leaving therapy he did not feel well, but the last couple of days it has been better.     Patient is accompained by:  Interpreter    Currently in Pain?  Yes    Pain Score  3     Pain Location  Neck    Pain Orientation  Left    Pain Descriptors / Indicators  Aching    Pain Onset  More than a month ago       Objective:  Palpation: mild to moderate spasms and tenderness along L upper trapezius and cervical spine paraspinal muscles and SCM left side, posterior cervical paraspinals on L reproduces concordant pain.   Treatment:  Therapeutic Exercises: goal: To improve ROM, increase tissue extensibility, improve posture  (SNAGs) to promote increased cervical ROM, 10x5"?b/l with extensive tactile cues  Posture corrections with chin tucks 15x5" Seated scapular retractions, tactile cues throughout for proper form/technique   Manual Therapy: 25 min: goal : spasms, pain, improve ROM  cervical spine  STM, IASTM, with trigger point release to L UT/levator scap, suboccipital muscles with patient in sitting, mobilizations with movement of cervical rotation  Cervical rotation with manual assist a C4-C6 from PT without pain   Patient response to treatment: Improved ROM cervical spine rotation with reported decreased tightness from 3/10 to 2/10. Several cavitations noted this session without pain.     PT Education - 01/14/18 1347    Education Details  posture, HEP compliance    Person(s) Educated  Patient    Methods  Explanation;Demonstration          PT Long Term Goals - 01/06/18 1159      PT LONG TERM GOAL #1   Title  Patient will report max pain level to 5/10 to allow improved function with daily activities involving neck     Baseline  intially 10/28/17: pain ranges from 7-10/10 and worse with turning head; 12/09/17: pain level ranges from 3-8/10 01/06/09 3-6/10    Time  4    Period  Weeks    Status  On-going    Target Date  02/03/18      PT LONG TERM GOAL #2   Title  Patient will report max pain level to 3/10 to allow improved function with daily activities involving neck     Baseline  pain ranges from  7-10/10 and worse with turning head; max pain level 12/09/2017: 8/10 : pain ranges from 3-6/10 01/06/18    Time  4    Period  Weeks    Status  On-going    Target Date  02/03/18      PT LONG TERM GOAL #3   Title  patient will be independent with home program for pain control and exercises for posture to allow self management of symptoms once discharged from physical therapy    Baseline  Reports some compliance with exercises at home, final HEP not completed yet    Time  4    Period  Weeks    Status  On-going    Target Date  02/03/18      PT LONG TERM GOAL #4   Title  Patient will demonstrate improved function with daily activities with decreased pain intensity to mild with NDI score of 20% or less    Baseline  NDI 34%    Time  4    Period  Weeks    Status   On-going    Target Date  02/03/18      PT LONG TERM GOAL #5   Title  Patient will demonstrate cervical rotation with pain 0-3/10 at most to promote return to PLOF, improve ease with functional activities such as driving.     Time  4    Period  Weeks    Status  New    Target Date  02/03/18            Plan - 01/14/18 1427    Clinical Impression Statement  Patient reports that he is performing STM at home with good results. Able to tolerate progression of program without increasing pain. Several cavitations noted during session today, with cervical rotation and cervical lateral flexion. Overall decreased pain reports during session. Many tactile cues needed for proper posture/technique today.     PT Frequency  2x / week    PT Duration  4 weeks    PT Treatment/Interventions  Iontophoresis 4mg /ml Dexamethasone;Cryotherapy;Electrical Stimulation;Ultrasound;Moist Heat;Therapeutic exercise;Therapeutic activities;Neuromuscular re-education;Patient/family education;Manual techniques;Dry needling    PT Next Visit Plan  re assess ROM, function and continue with current treatment as indicated    PT Home Exercise Plan  posture awareness, correction, use of lumbar and cervical rolls for positioning sitting and sleeping; scapular retraction standing with resistive bands; cervical spine extension isometrics, UE endurance exercises    Consulted and Agree with Plan of Care  Patient       Patient will benefit from skilled therapeutic intervention in order to improve the following deficits and impairments:  Pain, Postural dysfunction, Increased muscle spasms, Decreased activity tolerance, Decreased endurance, Decreased range of motion, Impaired perceived functional ability, Impaired UE functional use  Visit Diagnosis: Other muscle spasm  Cervicalgia     Problem List There are no active problems to display for this patient.  Lieutenant Diego PT, DPT 2:30 PM,01/14/18 [facility] [address] [place], [address] Phone: [phone]   Fax:  [phone]  Name: ANTERIO SCHEEL MRN: [id number] Date of Birth: January 02, 1946

## 2018-01-20 ENCOUNTER — Ambulatory Visit: Payer: Medicare HMO

## 2018-01-20 DIAGNOSIS — M62838 Other muscle spasm: Secondary | ICD-10-CM

## 2018-01-20 DIAGNOSIS — M542 Cervicalgia: Secondary | ICD-10-CM

## 2018-01-20 NOTE — Therapy (Signed)
[facility] [address] [place], [address] Phone: [phone]   Fax:  [phone]  Physical Therapy Treatment  Patient Details  Name: Joe Torres MRN: [id number] Date of Birth: 28-Dec-1945 Referring Provider: Kingsley Spittle MD   Encounter Date: 01/20/2018  PT End of Session - 01/20/18 0944    Visit Number  20    Number of Visits  27    Date for PT Re-Evaluation  02/03/18    Authorization Type  6/01 (recert and reassessed 0/93/23) 3/10?    PT Start Time  0903    PT Stop Time  0945    PT Time Calculation (min)  42 min    Activity Tolerance  Patient tolerated treatment well    Behavior During Therapy  [facility] for tasks assessed/performed       History reviewed. No pertinent past medical history.  History reviewed. No pertinent surgical history.  There were no vitals filed for this visit.  Subjective Assessment - 01/20/18 0905    Subjective  Patient reports that he had a good weekend, states that his neck still bothers him when he turns his head to his L it still bothers him the most. Still has some minimal complaints of R rotation as well if he tries to turn as much as he can.    Patient is accompained by:  Interpreter    Pain Score  3     Pain Location  Neck    Pain Orientation  Left    Pain Type  Acute pain    Pain Onset  More than a month ago        Objective:  Palpation: mild spasms and tenderness along L upper trapezius, moderate spasms and tenderness with L levator scapulae, reproduces concordant pain.   Treatment:  Therapeutic Exercises: goal: To improve ROM, increase tissue extensibility, improve posture  Cervical rotations x15 with tactile cues Cervical lateral fexion x15 with verbal cues Posture corrections with chin tucks 15x5"  Manual Therapy: 15 min: goal : spasms, pain, improve ROM cervical spine  STM, with trigger point release to L UT/levator scap.  Modalities:  x4mins Russisan stim to b/l scapular retractors to improve motor control/strengthen postural muscles, scapular retractions with russian stim.   Patient response to treatment: Improved ROM cervical spine rotation with reported decreased tightness. Soft tissue integrity improved by 50% after STM     PT Education - 01/20/18 0958    Education Details  proper posture, HEP techniques    Person(s) Educated  Patient    Methods  Explanation;Demonstration;Verbal cues    Comprehension  Verbalized understanding;Returned demonstration          PT Long Term Goals - 01/06/18 1159      PT LONG TERM GOAL #1   Title  Patient will report max pain level to 5/10 to allow improved function with daily activities involving neck     Baseline  intially 10/28/17: pain ranges from 7-10/10 and worse with turning head; 12/09/17: pain level ranges from 3-8/10 01/06/09 3-6/10    Time  4    Period  Weeks    Status  On-going    Target Date  02/03/18      PT LONG TERM GOAL #2   Title  Patient will report max pain level to 3/10 to allow improved function with daily activities involving neck     Baseline  pain ranges from 7-10/10 and worse with turning head; max pain level  12/09/2017: 8/10 : pain ranges from 3-6/10 01/06/18    Time  4    Period  Weeks    Status  On-going    Target Date  02/03/18      PT LONG TERM GOAL #3   Title  patient will be independent with home program for pain control and exercises for posture to allow self management of symptoms once discharged from physical therapy    Baseline  Reports some compliance with exercises at home, final HEP not completed yet    Time  4    Period  Weeks    Status  On-going    Target Date  02/03/18      PT LONG TERM GOAL #4   Title  Patient will demonstrate improved function with daily activities with decreased pain intensity to mild with NDI score of 20% or less    Baseline  NDI 34%    Time  4    Period  Weeks    Status  On-going    Target Date   02/03/18      PT LONG TERM GOAL #5   Title  Patient will demonstrate cervical rotation with pain 0-3/10 at most to promote return to PLOF, improve ease with functional activities such as driving.     Time  4    Period  Weeks    Status  New    Target Date  02/03/18          Patient will benefit from skilled therapeutic intervention in order to improve the following deficits and impairments:  Pain, Postural dysfunction, Increased muscle spasms, Decreased activity tolerance, Decreased endurance, Decreased range of motion, Impaired perceived functional ability, Impaired UE functional use  Visit Diagnosis: Other muscle spasm  Cervicalgia     Problem List There are no active problems to display for this patient.   Lieutenant Diego PT, DPT 10:09 AM,01/20/18 [facility] [address] [place], [address] Phone: [phone]   Fax:  [phone]  Name: Joe Torres MRN: [id number] Date of Birth: 22-Jul-1945

## 2018-01-22 ENCOUNTER — Ambulatory Visit: Payer: Medicare HMO

## 2018-01-27 ENCOUNTER — Ambulatory Visit: Payer: Medicare HMO

## 2018-01-27 DIAGNOSIS — M542 Cervicalgia: Secondary | ICD-10-CM

## 2018-01-27 DIAGNOSIS — M62838 Other muscle spasm: Secondary | ICD-10-CM

## 2018-01-27 NOTE — Therapy (Signed)
[facility] [address] [place], [address] Phone: [phone]   Fax:  [phone]  Physical Therapy Treatment  Patient Details  Name: Joe Torres MRN: [id number] Date of Birth: 04-06-1946 Referring Provider: Kingsley Spittle MD   Encounter Date: 01/27/2018  PT End of Session - 01/27/18 0952    Visit Number  21    Number of Visits  27    Date for PT Re-Evaluation  02/03/18    Authorization Type  4/10    PT Start Time  0948    PT Stop Time  1032    PT Time Calculation (min)  44 min    Activity Tolerance  Patient tolerated treatment well       History reviewed. No pertinent past medical history.  History reviewed. No pertinent surgical history.  There were no vitals filed for this visit.  Subjective Assessment - 01/27/18 0950    Subjective  Patient reports that last week he had to cancel due to his COPD. States he is now taking medication for potential flu symptoms. States his pain is about the same today, though the HA pain is better.    Patient is accompained by:  Interpreter    Currently in Pain?  Yes    Pain Score  3     Pain Location  Neck       Objective:  Palpation: mild spasms and tenderness along  L cervical paraspinals, reproduces concordant pain.   Treatment:  Therapeutic Exercises: goal: To improve ROM, increase tissue extensibility, improve posture  Cervical rotations x15 with tactile cues Scapular rows x15 with RTB with russian stim in place   Manual Therapy: 25 min: goal : spasms, pain, improve ROM cervical spine  STM, with trigger point release to L UT/levator scap. Cervical rotations with mobilizations, 1 cavitation noted. x2mins of cervical traction, no change in symptoms reported. Pain initially reports pain with returning to resting position first 2 traction pulls.  Modalities: x71mins Russisan stim to b/l scapular retractors to improve motor control/strengthen postural  muscles, scapular retractions with russian stim.   Patient response to treatment:Improved ROM cervical spine rotation with reported decreased tightness. Soft tissue integrity improved by 50% after STM     PT Education - 01/27/18 1100    Education Details  therex, condition    Person(s) Educated  Patient    Methods  Explanation;Demonstration    Comprehension  Verbalized understanding          PT Long Term Goals - 01/06/18 1159      PT LONG TERM GOAL #1   Title  Patient will report max pain level to 5/10 to allow improved function with daily activities involving neck     Baseline  intially 10/28/17: pain ranges from 7-10/10 and worse with turning head; 12/09/17: pain level ranges from 3-8/10 01/06/09 3-6/10    Time  4    Period  Weeks    Status  On-going    Target Date  02/03/18      PT LONG TERM GOAL #2   Title  Patient will report max pain level to 3/10 to allow improved function with daily activities involving neck     Baseline  pain ranges from 7-10/10 and worse with turning head; max pain level 12/09/2017: 8/10 : pain ranges from 3-6/10 01/06/18    Time  4    Period  Weeks    Status  On-going    Target Date  02/03/18      PT LONG TERM GOAL #3   Title  patient will be independent with home program for pain control and exercises for posture to allow self management of symptoms once discharged from physical therapy    Baseline  Reports some compliance with exercises at home, final HEP not completed yet    Time  4    Period  Weeks    Status  On-going    Target Date  02/03/18      PT LONG TERM GOAL #4   Title  Patient will demonstrate improved function with daily activities with decreased pain intensity to mild with NDI score of 20% or less    Baseline  NDI 34%    Time  4    Period  Weeks    Status  On-going    Target Date  02/03/18      PT LONG TERM GOAL #5   Title  Patient will demonstrate cervical rotation with pain 0-3/10 at most to promote return to PLOF,  improve ease with functional activities such as driving.     Time  4    Period  Weeks    Status  New    Target Date  02/03/18            Plan - 01/27/18 1100    Clinical Impression Statement  Patient demonstrates plateau in progress in the last several visits. His neck pain stays about 3-5/10. continues to have soft tissue integrity tension especially with cervical paraspinals. Patient reports good compliance with HEP and STM with tennis ball at home.     PT Frequency  2x / week    PT Duration  4 weeks       Patient will benefit from skilled therapeutic intervention in order to improve the following deficits and impairments:  Pain, Postural dysfunction, Increased muscle spasms, Decreased activity tolerance, Decreased endurance, Decreased range of motion, Impaired perceived functional ability, Impaired UE functional use  Visit Diagnosis: Other muscle spasm  Cervicalgia     Problem List There are no active problems to display for this patient.  Lieutenant Diego PT, DPT 11:15 AM,01/27/18 [phone]  [facility] [address], [place], [address] Phone: [phone]   Fax:  [phone]  Name: Joe Torres MRN: [id number] Date of Birth: 1946-03-22

## 2018-01-29 ENCOUNTER — Ambulatory Visit: Payer: Medicare HMO

## 2018-01-29 DIAGNOSIS — M542 Cervicalgia: Secondary | ICD-10-CM

## 2018-01-29 DIAGNOSIS — M62838 Other muscle spasm: Secondary | ICD-10-CM

## 2018-01-29 NOTE — Therapy (Signed)
[facility] [address] [place], [address] Phone: [phone]   Fax:  [phone]  Physical Therapy Treatment  Patient Details  Name: Joe Torres MRN: [id number] Date of Birth: [birth year] Referring Provider: Kingsley Spittle MD   Encounter Date: 01/29/2018  PT End of Session - 01/29/18 1129    Visit Number  22    Number of Visits  27    Date for PT Re-Evaluation  02/03/18    Authorization Type  5/10    PT Start Time  0945    PT Stop Time  1030    PT Time Calculation (min)  45 min    Activity Tolerance  Patient tolerated treatment well    Behavior During Therapy  [facility] for tasks assessed/performed       History reviewed. No pertinent past medical history.  History reviewed. No pertinent surgical history.  There were no vitals filed for this visit.  Subjective Assessment - 01/29/18 0949    Subjective  Pt reports that he is doing well today. He reports 4/10 neck pain today. Reports improvement in pain since starting therapy. No specific questions or concerns currently.     Patient is accompained by:  Interpreter    Currently in Pain?  Yes    Pain Score  4     Pain Location  Neck    Pain Orientation  Left    Pain Descriptors / Indicators  Aching    Pain Type  Chronic pain    Pain Onset  More than a month ago          TREATMENT  Manual Therapy  UBE x 4 minutes for warm-up during history (2 min forward/2 min backwards), 2 minutes unbilled Cervical rotation PROM with end range hold x 10 bilateral; Cervical lateral flexion PROM with end range 10 second hold x 2 bilateral; C2-C6 grade I CPA, 30s/bout x 2 bouts/level; C2/3 L rotational mobilization with movement in supine x 10; STM, with trigger point release to L UT/levator scap; Gentle cervical traction with 10s hold, 10s release x 10;  Trigger Point Dry Needling (TDN), unbilled Education performed with patient regarding potential benefit of  TDN. Reviewed precautions and risks with patient. Reviewed special precautions/risks over lung fields which include pneumothorax. Reviewed signs and symptoms of pneumothorax and advised pt to go to ER immediately if these symptoms develop advise them of dry needling treatment. Extensive time spent with pt to ensure full understanding of TDN risks. Pt provided verbal consent to treatment. TDN performed to L splenius capitus, cervical multifidus, and upper trap with 0.30 x 60 single needle placements with local twitch response (LTR) at each site. Pistoning technique utilized. Improved L cervical rotation motion from 45 to 57 degrees following intervention.   Estim [employer] Estim to bilateral cervical paraspinals at pt tolerated intensity (200V on L and 210V on R) x 15 minutes with moist heat pack. No signs of superficial skin irritation following removal of electrodes. Pt reports further reduction in pain and improved motion following estim.   Pt reporting pain with left cervical rotation and especially when returning back to midline from a left rotated position. Pt reports decrease in pain and improved cervical mobility following TDN and electrical stimulation. Pt encouraged to continue HEP and follow-up as scheduled.                         PT Long Term Goals - 01/06/18  [place] #1   Title  Patient will report max pain level to 5/10 to allow improved function with daily activities involving neck     Baseline  intially 10/28/17: pain ranges from 7-10/10 and worse with turning head; 12/09/17: pain level ranges from 3-8/10 01/06/09 3-6/10    Time  4    Period  Weeks    Status  On-going    Target Date  02/03/18      PT LONG TERM GOAL #2   Title  Patient will report max pain level to 3/10 to allow improved function with daily activities involving neck     Baseline  pain ranges from 7-10/10 and worse with turning head; max pain level 12/09/2017: 8/10 : pain ranges from  3-6/10 01/06/18    Time  4    Period  Weeks    Status  On-going    Target Date  02/03/18      PT LONG TERM GOAL #3   Title  patient will be independent with home program for pain control and exercises for posture to allow self management of symptoms once discharged from physical therapy    Baseline  Reports some compliance with exercises at home, final HEP not completed yet    Time  4    Period  Weeks    Status  On-going    Target Date  02/03/18      PT LONG TERM GOAL #4   Title  Patient will demonstrate improved function with daily activities with decreased pain intensity to mild with NDI score of 20% or less    Baseline  NDI 34%    Time  4    Period  Weeks    Status  On-going    Target Date  02/03/18      PT LONG TERM GOAL #5   Title  Patient will demonstrate cervical rotation with pain 0-3/10 at most to promote return to PLOF, improve ease with functional activities such as driving.     Time  4    Period  Weeks    Status  New    Target Date  02/03/18            Plan - 01/29/18 1131    Clinical Impression Statement  Pt reporting pain with left cervical rotation and especially when returning back to midline from a left rotated position. Pt reports decrease in pain and improved cervical mobility following TDN and electrical stimulation. Pt encouraged to continue HEP and follow-up as scheduled.       PT Frequency  2x / week    PT Duration  4 weeks    PT Treatment/Interventions  Iontophoresis 4mg /ml Dexamethasone;Cryotherapy;Electrical Stimulation;Ultrasound;Moist Heat;Therapeutic exercise;Therapeutic activities;Neuromuscular re-education;Patient/family education;Manual techniques;Dry needling    PT Next Visit Plan  re assess ROM, function and continue with current treatment as indicated. Assess response to dry needling    PT Home Exercise Plan  posture awareness, correction, use of lumbar and cervical rolls for positioning sitting and sleeping; scapular retraction standing  with resistive bands; cervical spine extension isometrics, UE endurance exercises       Patient will benefit from skilled therapeutic intervention in order to improve the following deficits and impairments:  Pain, Postural dysfunction, Increased muscle spasms, Decreased activity tolerance, Decreased endurance, Decreased range of motion, Impaired perceived functional ability, Impaired UE functional use  Visit Diagnosis: Other muscle spasm  Cervicalgia     Problem List There are no  active problems to display for this patient.  Lyndel Safe Sakshi Sermons PT, DPT, GCS  Kalin Kyler 01/29/2018, 11:44 AM  [facility] [address] [place], [address] Phone: [phone]   Fax:  [phone]  Name: KYRI DAI MRN: [id number] Date of Birth: [birth year]

## 2018-02-03 ENCOUNTER — Ambulatory Visit: Payer: Medicare HMO

## 2018-02-03 DIAGNOSIS — M542 Cervicalgia: Secondary | ICD-10-CM

## 2018-02-03 DIAGNOSIS — M62838 Other muscle spasm: Secondary | ICD-10-CM

## 2018-02-03 NOTE — Patient Instructions (Signed)
HEP updated, progressed as needed.

## 2018-02-03 NOTE — Therapy (Addendum)
[facility] [address], [place], [address] Phone: [phone]   Fax:  [phone]  Physical Therapy Treatment  Patient Details  Name: Joe Torres MRN: [id number] Date of Birth: 1945-08-07 Referring Provider: Kingsley Spittle   Encounter Date: 02/03/2018  PT End of Session - 02/03/18 0949    Visit Number  23    Number of Visits  27    Date for PT Re-Evaluation  02/17/18    Authorization Type  6/10    PT Start Time  0949    PT Stop Time  1031    PT Time Calculation (min)  42 min    Activity Tolerance  Patient tolerated treatment well    Behavior During Therapy  [facility] for tasks assessed/performed       No past medical history on file.  No past surgical history on file.  There were no vitals filed for this visit.  Subjective Assessment - 02/03/18 0954    Subjective  Patient thinks that TDN was helpful. States that his pain was a 2/10 after needling, and he feels like his knot is smaller.     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 Pain is worsening at this time and is radiating to the left shoulder, none in right shoulder.      Limitations  Lifting;Sitting;House hold activities;Other (comment)    Patient Stated Goals  decrease pain in neck so he can return to PLOF    Currently in Pain?  Yes    Pain Score  3    current pain 3.5 best: 2-3 worst: 4   Pain Location  Neck    Pain Orientation  Left    Pain Descriptors / Indicators  Constant;Tightness;Aching    Pain Type  Chronic pain    Pain Radiating Towards  T L lateral arm, above elbow    Pain Onset  More than a month ago    Aggravating Factors   head rotation, sleep ing position         [facility] PT Assessment - 02/03/18 0001       Assessment   Referring Provider  Burns, Harriett      ROM / Strength   AROM / PROM / Strength  AROM;Strength      AROM   Overall AROM   Deficits    AROM Assessment Site  Shoulder;Cervical    Right/Left Shoulder  --   R and L shoulder WFLs, pain with R shoulder abd/flex ~90deg   Cervical Flexion  45    Cervical Extension  50    Cervical - Right Side Bend  25    Cervical - Left Side Bend  40    Cervical - Right Rotation  50%    Cervical - Left Rotation  60%      Strength   Overall Strength  Within functional limits for tasks performed      Flexibility   Soft Tissue Assessment /Muscle Length  --   TTP cervical paraspinals, suboccipital space, UT, lat should        See above for objective measurements.   TREATMENT Therex: PT and patient spent time reviewing condition, progress and POC. HEP  also updated and progressed as appropriate. See patient instruction section for updates.    Manual therapy:  Trigger Point Dry Needling (TDN), unbilled: Performed by Roxana Hires PT, DPT Education performed with patient regarding potential benefit of TDN. Reviewed precautions and risks with patient. Pt provided verbal consent to treatment. TDN performed to L splenius capitus, cervical multifidus, and upper trap with 0.30 x 60 single needle placements with local twitch response (LTR) at each site. Pistoning technique utilized. Improved L cervical rotation motion improved from last session.   Patient response to treatment: Patient reported little to no change post TDN, but demonstrates improved cervical rotation b/l.    PT Education - 02/03/18 1230    Education Details  therex, POC    Person(s) Educated  Patient    Methods  Explanation;Demonstration    Comprehension  Verbalized understanding          PT Long Term Goals - 02/03/18 0954      PT LONG TERM GOAL #1   Title  Patient will report max pain level to 5/10 to allow improved function with daily activities involving neck      Baseline  intially 10/28/17: pain ranges from 7-10/10 and worse with turning head; 12/09/17: pain level ranges from 3-8/10 01/06/09 3-6/10: max pain is 4/10    Time  4    Period  Weeks    Status  Achieved    Target Date  02/03/18      PT LONG TERM GOAL #2   Title  Patient will report max pain level to 3/10 to allow improved function with daily activities involving neck     Baseline  pain ranges from 7-10/10 and worse with turning head; max pain level 12/09/2017: 8/10 : pain ranges from 3-6/10 01/06/18 : pain ranges up to a 4-5/10    Time  4    Period  Weeks    Status  On-going    Target Date  02/17/18      PT LONG TERM GOAL #3   Title  patient will be independent with home program for pain control and exercises for posture to allow self management of symptoms once discharged from physical therapy    Baseline  Reports some compliance with exercises at home, final HEP not administered yet    Time  4    Period  Weeks    Status  On-going    Target Date  02/17/18      PT LONG TERM GOAL #4   Title  Patient will demonstrate improved function with daily activities with decreased pain intensity to mild with NDI score of 20% or less    Baseline  NDI 34%: FOTO 56    Time  4    Period  Weeks    Status  On-going      PT LONG TERM GOAL #5   Title  Patient will demonstrate cervical rotation with pain 0-3/10 at most to promote return to PLOF, improve ease with functional activities such as driving.     Time  4    Period  Weeks    Status  On-going    Target Date  02/24/18      Additional Long Term Goals   Additional Long Term Goals  Yes      PT LONG TERM GOAL #6   Title  Patient will report and demonstrate decreased tensoin/pain with cervical pain in response to TDN.    Time  2    Period  Weeks  Status  New    Target Date  02/17/18            Plan - 02/03/18 1236    Clinical Impression Statement  Patient TDN of cervical musculature, reported no improvement in symptoms, though did  demonstrate improvement in cervical rotation. Overall the patient demonstrated improved cervical ROM and minimal to no pain with MMT of UE (L shoulder abduction/flexion painful ~90deg), as well as overall improved pain from initial onset. Patient still has impaired soft tissue integrity, including cervical paraspinals/UT. The patient still has complaints of difficulty/pain with cervical lateral flexion and rotation and could benefit from a few sessions of TDN to treat remaining deficits.     Rehab Potential  Good    Clinical Impairments Affecting Rehab Potential  (-)pain worsening with radating symptoms across back to left side/shoulder; multiple co morbidities    PT Frequency  2x / week    PT Duration  2 weeks    PT Treatment/Interventions  Iontophoresis 4mg /ml Dexamethasone;Cryotherapy;Electrical Stimulation;Ultrasound;Moist Heat;Therapeutic exercise;Therapeutic activities;Neuromuscular re-education;Patient/family education;Manual techniques;Dry needling;Spinal Manipulations;Joint Manipulations;Passive range of motion;Balance training    PT Next Visit Plan  re assess ROM, function and continue with current treatment as indicated. Assess response to dry needling    PT Home Exercise Plan  posture awareness, correction, use of lumbar and cervical rolls for positioning sitting and sleeping; scapular retraction standing with resistive bands; cervical spine extension isometrics, UE endurance exercises    Consulted and Agree with Plan of Care  Patient       Patient will benefit from skilled therapeutic intervention in order to improve the following deficits and impairments:  Pain, Postural dysfunction, Increased muscle spasms, Decreased activity tolerance, Decreased endurance, Decreased range of motion, Impaired perceived functional ability, Impaired UE functional use  Visit Diagnosis: Other muscle spasm  Cervicalgia     Problem List There are no active problems to display for this  patient.  Lieutenant Diego PT, DPT 4:54 PM,02/03/18 [facility] [address] [place], [address] Phone: [phone]   Fax:  [phone]  Name: Joe Torres MRN: [id number] Date of Birth: [birth year]

## 2018-02-05 ENCOUNTER — Ambulatory Visit: Payer: Medicare HMO

## 2018-02-05 ENCOUNTER — Ambulatory Visit: Payer: Medicare HMO | Admitting: Physical Therapy

## 2018-02-05 ENCOUNTER — Encounter: Payer: Self-pay | Admitting: Physical Therapy

## 2018-02-05 DIAGNOSIS — M62838 Other muscle spasm: Secondary | ICD-10-CM | POA: Diagnosis not present

## 2018-02-05 NOTE — Therapy (Signed)
[facility] [address] [place], [address] Phone: [phone]   Fax:  [phone]  Physical Therapy Treatment  Patient Details  Name: Joe Torres MRN: [id number] Date of Birth: 15-Apr-1946 Referring Provider (PT): Kingsley Spittle   Encounter Date: 02/05/2018  PT End of Session - 02/05/18 1355    Visit Number  24    Number of Visits  27    Date for PT Re-Evaluation  02/17/18    PT Start Time  0145    PT Stop Time  0230    PT Time Calculation (min)  45 min    Activity Tolerance  Patient tolerated treatment well    Behavior During Therapy  [facility] for tasks assessed/performed       History reviewed. No pertinent past medical history.  History reviewed. No pertinent surgical history.  There were no vitals filed for this visit.  Subjective Assessment - 02/05/18 1350    Subjective  Patient reports his last session of TDN was very painful and did not give him pain relief. Patient reports he would rather have "massage and ESTIM" for pain relief. Patient reports 4/10 pain today, with patin increased to 8/10 last night.     Pertinent History  Patient reports he began having neck pain about 6 weeks ago when he extended his right arm to reach for a tool in the back of his [employer] Lei from the front seat and felt pain in his chest and back of his neck. Pain is worsening at this time and is radiating to the left shoulder, none in right shoulder.      Limitations  Lifting;Sitting;House hold activities;Other (comment)    Patient Stated Goals  decrease pain in neck so he can return to PLOF    Pain Onset  More than a month ago       Treatment:  Therapeutic Exercise - UBE 2 min forward 20min backward during hx intake - Standing rows 10# 3x 10 with demo and min TC to prevent shoulder hiking with good carry over following - Low rows green tband 3x 10 with demo and min cuing for proper form with good carry over following  Manual  Therapy: STM, with trigger point release to L UT/levator scap, and cervical paraspinals - Cervical traction 10sec traction/ 10sec relax x10 for pain modulation    Estim HiVolt Estim to bilateral cervical paraspinals at pt tolerated intensity (205V on L and 190V; inc to 210V on R) x 15 minutes with moist heat pack. No signs of superficial skin irritation following removal of electrode. PT utilized time to review SNAG with rotation with patient as he expressed some confusion on proper technique. Patient with decreased pain to 2/10 with improved cervical rotation following.                        PT Education - 02/05/18 1354    Education Details  Exercise form    Person(s) Educated  Patient    Methods  Explanation;Demonstration;Tactile cues;Verbal cues    Comprehension  Returned demonstration;Verbalized understanding;Verbal cues required;Tactile cues required          PT Long Term Goals - 02/03/18 0954      PT LONG TERM GOAL #1   Title  Patient will report max pain level to 5/10 to allow improved function with daily activities involving neck     Baseline  intially 10/28/17: pain ranges from 7-10/10 and worse with  turning head; 12/09/17: pain level ranges from 3-8/10 01/06/09 3-6/10: max pain is 4/10    Time  4    Period  Weeks    Status  Achieved    Target Date  02/03/18      PT LONG TERM GOAL #2   Title  Patient will report max pain level to 3/10 to allow improved function with daily activities involving neck     Baseline  pain ranges from 7-10/10 and worse with turning head; max pain level 12/09/2017: 8/10 : pain ranges from 3-6/10 01/06/18 : pain ranges up to a 4-5/10    Time  4    Period  Weeks    Status  On-going    Target Date  02/17/18      PT LONG TERM GOAL #3   Title  patient will be independent with home program for pain control and exercises for posture to allow self management of symptoms once discharged from physical therapy    Baseline  Reports some  compliance with exercises at home, final HEP not administered yet    Time  4    Period  Weeks    Status  On-going    Target Date  02/17/18      PT LONG TERM GOAL #4   Title  Patient will demonstrate improved function with daily activities with decreased pain intensity to mild with NDI score of 20% or less    Baseline  NDI 34%: FOTO 56    Time  4    Period  Weeks    Status  On-going      PT LONG TERM GOAL #5   Title  Patient will demonstrate cervical rotation with pain 0-3/10 at most to promote return to PLOF, improve ease with functional activities such as driving.     Time  4    Period  Weeks    Status  On-going    Target Date  02/24/18      Additional Long Term Goals   Additional Long Term Goals  Yes      PT LONG TERM GOAL #6   Title  Patient will report and demonstrate decreased tensoin/pain with cervical pain in response to TDN.    Time  2    Period  Weeks    Status  New    Target Date  02/17/18            Plan - 02/05/18 1425    Clinical Impression Statement  PT continued to utilize STM with ESTIM to decrease pain as patient is reporting this is the most helpful to him. Patient reports decreased pain at the end of session. PT reviewe HEP with patient to ensure proper form with compliance. PT continued to utilise therex to improve postural control to decrease scapular elevation muscle tension.     Rehab Potential  Good    Clinical Impairments Affecting Rehab Potential  (-)pain worsening with radating symptoms across back to left side/shoulder; multiple co morbidities    PT Frequency  2x / week    PT Duration  2 weeks    PT Treatment/Interventions  Iontophoresis 4mg /ml Dexamethasone;Cryotherapy;Electrical Stimulation;Ultrasound;Moist Heat;Therapeutic exercise;Therapeutic activities;Neuromuscular re-education;Patient/family education;Manual techniques;Dry needling;Spinal Manipulations;Joint Manipulations;Passive range of motion;Balance training    PT Next Visit Plan  re  assess ROM, function and continue with current treatment as indicated. Assess response to dry needling    PT Home Exercise Plan  posture awareness, correction, use of lumbar and cervical rolls for positioning sitting and sleeping;  scapular retraction standing with resistive bands; cervical spine extension isometrics, UE endurance exercises    Consulted and Agree with Plan of Care  Patient       Patient will benefit from skilled therapeutic intervention in order to improve the following deficits and impairments:  Pain, Postural dysfunction, Increased muscle spasms, Decreased activity tolerance, Decreased endurance, Decreased range of motion, Impaired perceived functional ability, Impaired UE functional use  Visit Diagnosis: Other muscle spasm     Problem List There are no active problems to display for this patient.  Shelton Silvas PT, DPT Shelton Silvas 02/05/2018, 2:27 PM  [facility] [address] [place], [address] Phone: [phone]   Fax:  [phone]  Name: TAEQUAN STOCKHAUSEN MRN: [id number] Date of Birth: August 02, 1945

## 2018-02-10 ENCOUNTER — Ambulatory Visit: Payer: Medicare HMO | Admitting: Physical Therapy

## 2018-02-12 ENCOUNTER — Ambulatory Visit: Payer: Medicare HMO | Admitting: Physical Therapy

## 2018-02-16 ENCOUNTER — Ambulatory Visit: Payer: Medicare HMO

## 2018-02-18 ENCOUNTER — Encounter: Payer: Medicare HMO | Admitting: Physical Therapy

## 2018-02-18 ENCOUNTER — Ambulatory Visit: Payer: Medicare HMO

## 2018-06-19 DIAGNOSIS — M1711 Unilateral primary osteoarthritis, right knee: Secondary | ICD-10-CM | POA: Insufficient documentation

## 2018-09-21 ENCOUNTER — Other Ambulatory Visit (HOSPITAL_COMMUNITY): Payer: Self-pay | Admitting: Internal Medicine

## 2018-10-01 ENCOUNTER — Other Ambulatory Visit: Payer: Self-pay | Admitting: Internal Medicine

## 2018-10-01 DIAGNOSIS — G8929 Other chronic pain: Secondary | ICD-10-CM

## 2018-10-08 ENCOUNTER — Other Ambulatory Visit: Payer: Self-pay | Admitting: Internal Medicine

## 2018-10-08 DIAGNOSIS — M2669 Other specified disorders of temporomandibular joint: Secondary | ICD-10-CM

## 2018-10-13 ENCOUNTER — Other Ambulatory Visit: Payer: Self-pay

## 2018-10-13 ENCOUNTER — Ambulatory Visit
Admission: RE | Admit: 2018-10-13 | Discharge: 2018-10-13 | Disposition: A | Discharge status: Home / Self Care | Payer: Medicare HMO | Source: Ambulatory Visit | Attending: Internal Medicine | Admitting: Internal Medicine

## 2018-10-13 DIAGNOSIS — M2669 Other specified disorders of temporomandibular joint: Secondary | ICD-10-CM

## 2018-10-13 DIAGNOSIS — M542 Cervicalgia: Secondary | ICD-10-CM | POA: Diagnosis present

## 2018-10-13 DIAGNOSIS — G8929 Other chronic pain: Secondary | ICD-10-CM | POA: Diagnosis present

## 2019-05-27 DIAGNOSIS — E785 Hyperlipidemia, unspecified: Secondary | ICD-10-CM | POA: Insufficient documentation

## 2019-05-27 DIAGNOSIS — G4733 Obstructive sleep apnea (adult) (pediatric): Secondary | ICD-10-CM | POA: Insufficient documentation

## 2019-05-27 DIAGNOSIS — I10 Essential (primary) hypertension: Secondary | ICD-10-CM | POA: Insufficient documentation

## 2020-04-20 ENCOUNTER — Other Ambulatory Visit: Payer: Self-pay

## 2020-04-20 ENCOUNTER — Encounter: Payer: Self-pay | Admitting: Urology

## 2020-04-20 ENCOUNTER — Ambulatory Visit (INDEPENDENT_AMBULATORY_CARE_PROVIDER_SITE_OTHER): Payer: Medicare HMO | Admitting: Urology

## 2020-04-20 VITALS — BP 154/79 | HR 98 | Ht 66.1 in | Wt 160.0 lb

## 2020-04-20 DIAGNOSIS — R3129 Other microscopic hematuria: Secondary | ICD-10-CM | POA: Diagnosis not present

## 2020-04-20 DIAGNOSIS — Z87891 Personal history of nicotine dependence: Secondary | ICD-10-CM | POA: Diagnosis not present

## 2020-04-20 LAB — URINALYSIS, COMPLETE
Bilirubin, UA: NEGATIVE
Glucose, UA: NEGATIVE
Ketones, UA: NEGATIVE
Leukocytes,UA: NEGATIVE
Nitrite, UA: NEGATIVE
Specific Gravity, UA: 1.02 (ref 1.005–1.030)
Urobilinogen, Ur: 0.2 mg/dL (ref 0.2–1.0)
pH, UA: 7 (ref 5.0–7.5)

## 2020-04-20 LAB — MICROSCOPIC EXAMINATION
Bacteria, UA: NONE SEEN
RBC, Urine: >30 /hpf — AB (ref 0–2)

## 2020-04-20 NOTE — Progress Notes (Signed)
04/20/2020 9:45 AM   Joe Torres [birth year] [id number]  Referring provider: Ellamae Sia, MD [address] [place],  [place] [address]  Chief Complaint  Patient presents with  . Hematuria    HPI: 74 year old male who presents today for further evaluation of gross hematuria.  He is accompanied by a [place] interpreter today.  He was seen and evaluated by his primary care physician last month for an episode of gross hematuria.  He was treated for presumed infection although we do not have these labs.  Per his last clinic note, culture was not sent at that time.  He returned to his primary care physician at which time ongoing microscopic hematuria was noted.  He is also been having intermittent gross hematuria since then.  On one occasion he had a few small clots.  He reports that his urine is light pink today.  He denies any other urinary symptoms including no dysuria, urgency frequency or changes in his baseline voiding symptoms.  He does have a smoking history.  He quit smoking about 12 years ago but smoked for 30+ years prior to this, approximately 1.5 packs a day or more.  He denies any flank pain.  No history of kidney stones.  No family history of any GU malignancy.  He reported subjective weight loss to his primary care physician but his weight has been stable per her note.  He has a personal history of BPH on Flomax.   PMH: Past Medical History:  Diagnosis Date  . Hypertension     Surgical History: No past surgical history on file.  Home Medications:  Allergies as of 04/20/2020      Reactions   Shrimp [shellfish Allergy] Other (See Comments)   Per MD comments critical      Medication List       Accurate as of April 20, 2020  9:45 AM. If you have any questions, ask your nurse or doctor.        acetaminophen 500 MG tablet Commonly known as: TYLENOL Take 500 mg by mouth every 8 (eight) hours as needed.   albuterol 108 (90 Base) MCG/ACT  inhaler Commonly known as: VENTOLIN HFA Inhale 2 puffs into the lungs every 4 (four) hours as needed for wheezing or shortness of breath.   albuterol (2.5 MG/3ML) 0.083% nebulizer solution Commonly known as: PROVENTIL Take 2.5 mg by nebulization every 6 (six) hours as needed for wheezing or shortness of breath.   amLODipine 10 MG tablet Commonly known as: NORVASC Take 10 mg by mouth daily.   arformoterol 15 MCG/2ML Nebu Commonly known as: BROVANA Take 15 mcg by nebulization 2 (two) times daily.   cetirizine 10 MG chewable tablet Commonly known as: ZYRTEC Chew 10 mg by mouth daily.   hydrochlorothiazide 25 MG tablet Commonly known as: HYDRODIURIL Take 25 mg by mouth daily.   montelukast 10 MG tablet Commonly known as: SINGULAIR Take 10 mg by mouth at bedtime.   pantoprazole 40 MG tablet Commonly known as: PROTONIX Take 40 mg by mouth daily.   predniSONE 5 MG tablet Commonly known as: DELTASONE Take 5 mg by mouth daily with breakfast.   roflumilast 500 MCG Tabs tablet Commonly known as: DALIRESP Take 500 mcg by mouth daily.   simvastatin 20 MG tablet Commonly known as: ZOCOR Take 20 mg by mouth daily.   tamsulosin 0.4 MG Caps capsule Commonly known as: FLOMAX Take 0.4 mg by mouth.   traMADol 50 MG tablet Commonly known  as: ULTRAM Take 50 mg by mouth every 6 (six) hours as needed.       Allergies:  Allergies  Allergen Reactions  . Shrimp [Shellfish Allergy] Other (See Comments)    Per MD comments critical    Family History: No family history on file.  Social History:  reports that he quit smoking about 11 years ago. His smoking use included cigarettes. He has a 60.00 pack-year smoking history. He has never used smokeless tobacco. No history on file for alcohol use and drug use.   Physical Exam: BP (!) 154/79   Pulse 98   Ht 5' 6.1" (1.679 m)   Wt 160 lb (72.6 kg)   BMI 25.75 kg/m   Constitutional:  Alert and oriented, No acute distress. HEENT:  [place]ney AT, moist mucus membranes.  Trachea midline, no masses. Cardiovascular: No clubbing, cyanosis, or edema. Respiratory: Normal respiratory effort, no increased work of breathing. Skin: No rashes, bruises or suspicious lesions. Neurologic: Grossly intact, no focal deficits, moving all 4 extremities. Psychiatric: Normal mood and affect.  Urinalysis 30+ RBCs per high-powered field on urinalysis today, otherwise negative   Assessment & Plan:    1. Microscopic hematuria/ gross hematuria We discussed the differential diagnosis for  hematuria including nephrolithiasis, renal or upper tract tumors, bladder stones, UTIs, or bladder tumors as well as undetermined etiologies. Per AUA guidelines, I did recommend complete microscopic hematuria evaluation including CTU, possible urine cytology, and office cystoscopy.  He is agreeable this plan.  I do have a fairly significant concern in this particular patient given his risk factors and extensive previous smoking history, ongoing gross hematuria, etc.  I try to expedite this work-up to be done prior to the new year if possible.  - Urinalysis, Complete  2. History of tobacco abuse Risk factor    Hollice Espy, MD  [facility] [address] [place], [place] [address] [phone]

## 2020-04-20 NOTE — Patient Instructions (Signed)
Cystoscopy Cystoscopy is a procedure that is used to help diagnose and sometimes treat conditions that affect the lower urinary tract. The lower urinary tract includes the bladder and the urethra. The urethra is the tube that drains urine from the bladder. Cystoscopy is done using a thin, tube-shaped instrument with a light and camera at the end (cystoscope). The cystoscope may be hard or flexible, depending on the goal of the procedure. The cystoscope is inserted through the urethra, into the bladder. Cystoscopy may be recommended if you have:  Urinary tract infections that keep coming back.  Blood in the urine (hematuria).  An inability to control when you urinate (urinary incontinence) or an overactive bladder.  Unusual cells found in a urine sample.  A blockage in the urethra, such as a urinary stone.  Painful urination.  An abnormality in the bladder found during an intravenous pyelogram (IVP) or CT scan. Cystoscopy may also be done to remove a sample of tissue to be examined under a microscope (biopsy). What are the risks? Generally, this is a safe procedure. However, problems may occur, including:  Infection.  Bleeding.  What happens during the procedure?  1. You will be given one or more of the following: ? A medicine to numb the area (local anesthetic). 2. The area around the opening of your urethra will be cleaned. 3. The cystoscope will be passed through your urethra into your bladder. 4. Germ-free (sterile) fluid will flow through the cystoscope to fill your bladder. The fluid will stretch your bladder so that your health care provider can clearly examine your bladder walls. 5. Your doctor will look at the urethra and bladder. 6. The cystoscope will be removed The procedure may vary among health care providers  What can I expect after the procedure? After the procedure, it is common to have: 1. Some soreness or pain in your abdomen and urethra. 2. Urinary symptoms.  These include: ? Mild pain or burning when you urinate. Pain should stop within a few minutes after you urinate. This may last for up to 1 week. ? A small amount of blood in your urine for several days. ? Feeling like you need to urinate but producing only a small amount of urine. Follow these instructions at home: General instructions  Return to your normal activities as told by your health care provider.   Do not drive for 24 hours if you were given a sedative during your procedure.  Watch for any blood in your urine. If the amount of blood in your urine increases, call your health care provider.  If a tissue sample was removed for testing (biopsy) during your procedure, it is up to you to get your test results. Ask your health care provider, or the department that is doing the test, when your results will be ready.  Drink enough fluid to keep your urine pale yellow.  Keep all follow-up visits as told by your health care provider. This is important. Contact a health care provider if you:  Have pain that gets worse or does not get better with medicine, especially pain when you urinate.  Have trouble urinating.  Have more blood in your urine. Get help right away if you:  Have blood clots in your urine.  Have abdominal pain.  Have a fever or chills.  Are unable to urinate. Summary  Cystoscopy is a procedure that is used to help diagnose and sometimes treat conditions that affect the lower urinary tract.  Cystoscopy is done using   a thin, tube-shaped instrument with a light and camera at the end.  After the procedure, it is common to have some soreness or pain in your abdomen and urethra.  Watch for any blood in your urine. If the amount of blood in your urine increases, call your health care provider.  If you were prescribed an antibiotic medicine, take it as told by your health care provider. Do not stop taking the antibiotic even if you start to feel better. This  information is not intended to replace advice given to you by your health care provider. Make sure you discuss any questions you have with your health care provider. Document Revised: 04/21/2018 Document Reviewed: 04/21/2018 Elsevier Patient Education  2020 [employer]   

## 2020-04-24 NOTE — Addendum Note (Signed)
Addended by: Verlene Mayer A on: 04/24/2020 01:56 PM   Modules accepted: Orders

## 2020-04-25 ENCOUNTER — Other Ambulatory Visit
Admission: RE | Admit: 2020-04-25 | Discharge: 2020-04-25 | Disposition: A | Discharge status: Home / Self Care | Payer: Medicare HMO | Source: Home / Self Care | Attending: Urology | Admitting: Urology

## 2020-04-25 ENCOUNTER — Other Ambulatory Visit: Payer: Self-pay

## 2020-04-25 ENCOUNTER — Other Ambulatory Visit: Payer: Self-pay | Admitting: *Deleted

## 2020-04-25 ENCOUNTER — Ambulatory Visit
Admission: RE | Admit: 2020-04-25 | Discharge: 2020-04-25 | Disposition: A | Discharge status: Home / Self Care | Payer: Medicare HMO | Source: Ambulatory Visit | Attending: Urology | Admitting: Urology

## 2020-04-25 DIAGNOSIS — R3129 Other microscopic hematuria: Secondary | ICD-10-CM

## 2020-04-25 HISTORY — DX: Unspecified asthma, uncomplicated: J45.909

## 2020-04-25 LAB — CREATININE, SERUM
Creatinine, Ser: 0.97 mg/dL (ref 0.61–1.24)
GFR, Estimated: >60 mL/min (ref >60)

## 2020-04-25 MED ORDER — IOHEXOL 300 MG/ML  SOLN
150.0000 mL | Freq: Once | INTRAMUSCULAR | Status: AC | PRN
  Administered 2020-04-25: 125 mL via INTRAVENOUS

## 2020-04-26 ENCOUNTER — Other Ambulatory Visit: Payer: Self-pay | Admitting: Radiology

## 2020-04-26 ENCOUNTER — Ambulatory Visit (INDEPENDENT_AMBULATORY_CARE_PROVIDER_SITE_OTHER): Payer: Medicare HMO | Admitting: Urology

## 2020-04-26 ENCOUNTER — Encounter: Payer: Self-pay | Admitting: Urology

## 2020-04-26 VITALS — BP 159/79 | HR 99

## 2020-04-26 DIAGNOSIS — C674 Malignant neoplasm of posterior wall of bladder: Secondary | ICD-10-CM | POA: Diagnosis not present

## 2020-04-26 DIAGNOSIS — C679 Malignant neoplasm of bladder, unspecified: Secondary | ICD-10-CM

## 2020-04-26 LAB — URINALYSIS, COMPLETE
Bilirubin, UA: NEGATIVE
Glucose, UA: NEGATIVE
Ketones, UA: NEGATIVE
Leukocytes,UA: NEGATIVE
Nitrite, UA: NEGATIVE
Specific Gravity, UA: 1.015 (ref 1.005–1.030)
Urobilinogen, Ur: 0.2 mg/dL (ref 0.2–1.0)
pH, UA: 7 (ref 5.0–7.5)

## 2020-04-26 LAB — MICROSCOPIC EXAMINATION
Bacteria, UA: NONE SEEN
RBC, Urine: >30 /hpf — AB (ref 0–2)

## 2020-04-26 MED ORDER — GEMCITABINE CHEMO FOR BLADDER INSTILLATION 2000 MG: 2000.0000 mg | Freq: Once | INTRAVENOUS | Status: DC

## 2020-04-26 NOTE — Progress Notes (Signed)
   04/26/20  CC:  Chief Complaint  Patient presents with  . Cysto    HPI: 74 year old male with intermittent gross hematuria who presents today for cystoscopic evaluation.  He underwent CT urogram which showed two lesions in the bladder, one measuring 2.1 x 2.1 cm on the posterior bladder wall as well as an eccentric mass just left of the midline measuring 1.6 x 1.1 cm.  No upper tract filling defects.  He is accompanied today by his daughter as well as Patent attorney, Ronnald Collum.   He is on home O2.  Blood pressure (!) 159/79, pulse 99. NED. A&Ox3.   No respiratory distress   Abd soft, NT, ND Normal phallus with bilateral descended testicles  Cystoscopy Procedure Note  Patient identification was confirmed, informed consent was obtained, and patient was prepped using Betadine solution.  Lidocaine jelly was administered per urethral meatus.     Pre-Procedure: - Inspection reveals a normal caliber ureteral meatus.  Procedure: The flexible cystoscope was introduced without difficulty - No urethral strictures/lesions are present. - Enlarged prostate  -Visualization was somewhat murky due to gross hematuria however there is a large at least 2 cm bladder tumor, spherical and somewhat high-grade appearing in the midline posterior bladder wall.   Assessment/ Plan:  1. 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  I recommended intravesical chemotherapy in the form of gemcitabine in addition to this following TURBT to reduce the risk of recurrence.  Possible risk of this were also discussed.  All questions answered.  Given his history of COPD on home O2, he really will discuss with anesthesiology whether this can be done with spinal anesthetic and  sedation rather than general anesthesia.  All questions answered  Preoperative UA/urine culture.  - Urinalysis, Complete - CULTURE, URINE COMPREHENSIVE   Hollice Espy, MD

## 2020-04-26 NOTE — H&P (View-Only) (Signed)
   04/26/20  CC:  Chief Complaint  Patient presents with  . Cysto    HPI: 74 year old male with intermittent gross hematuria who presents today for cystoscopic evaluation.  He underwent CT urogram which showed two lesions in the bladder, one measuring 2.1 x 2.1 cm on the posterior bladder wall as well as an eccentric mass just left of the midline measuring 1.6 x 1.1 cm.  No upper tract filling defects.  He is accompanied today by his daughter as well as Patent attorney, Ronnald Collum.   He is on home O2.  Blood pressure (!) 159/79, pulse 99. NED. A&Ox3.   No respiratory distress   Abd soft, NT, ND Normal phallus with bilateral descended testicles  Cystoscopy Procedure Note  Patient identification was confirmed, informed consent was obtained, and patient was prepped using Betadine solution.  Lidocaine jelly was administered per urethral meatus.     Pre-Procedure: - Inspection reveals a normal caliber ureteral meatus.  Procedure: The flexible cystoscope was introduced without difficulty - No urethral strictures/lesions are present. - Enlarged prostate  -Visualization was somewhat murky due to gross hematuria however there is a large at least 2 cm bladder tumor, spherical and somewhat high-grade appearing in the midline posterior bladder wall.   Assessment/ Plan:  1. 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  I recommended intravesical chemotherapy in the form of gemcitabine in addition to this following TURBT to reduce the risk of recurrence.  Possible risk of this were also discussed.  All questions answered.  Given his history of COPD on home O2, he really will discuss with anesthesiology whether this can be done with spinal anesthetic and  sedation rather than general anesthesia.  All questions answered  Preoperative UA/urine culture.  - Urinalysis, Complete - CULTURE, URINE COMPREHENSIVE   Hollice Espy, MD

## 2020-04-28 ENCOUNTER — Encounter: Payer: Self-pay | Admitting: Internal Medicine

## 2020-05-01 LAB — CULTURE, URINE COMPREHENSIVE

## 2020-05-15 ENCOUNTER — Other Ambulatory Visit: Payer: Medicare HMO

## 2020-05-15 ENCOUNTER — Other Ambulatory Visit: Payer: Self-pay | Admitting: Radiology

## 2020-05-15 ENCOUNTER — Other Ambulatory Visit: Payer: Self-pay

## 2020-05-15 DIAGNOSIS — C679 Malignant neoplasm of bladder, unspecified: Secondary | ICD-10-CM

## 2020-05-15 DIAGNOSIS — R3129 Other microscopic hematuria: Secondary | ICD-10-CM

## 2020-05-15 NOTE — Addendum Note (Signed)
Addended by: Filomena Jungling on: 05/15/2020 11:33 AM   Modules accepted: Orders

## 2020-05-16 ENCOUNTER — Other Ambulatory Visit
Admission: RE | Admit: 2020-05-16 | Discharge: 2020-05-16 | Disposition: A | Discharge status: Home / Self Care | Payer: Medicare HMO | Source: Ambulatory Visit | Attending: Urology | Admitting: Urology

## 2020-05-16 HISTORY — DX: Unspecified osteoarthritis, unspecified site: M19.90

## 2020-05-16 HISTORY — DX: Chronic obstructive pulmonary disease, unspecified: J44.9

## 2020-05-16 HISTORY — DX: Dyspnea, unspecified: R06.00

## 2020-05-16 HISTORY — DX: Malignant (primary) neoplasm, unspecified: C80.1

## 2020-05-16 HISTORY — DX: Gastro-esophageal reflux disease without esophagitis: K21.9

## 2020-05-16 LAB — MICROSCOPIC EXAMINATION
Bacteria, UA: NONE SEEN
RBC, Urine: >30 /hpf — ABNORMAL HIGH (ref 0–2)

## 2020-05-16 LAB — URINALYSIS, COMPLETE
Bilirubin, UA: NEGATIVE
Glucose, UA: NEGATIVE
Nitrite, UA: NEGATIVE
Specific Gravity, UA: 1.015 (ref 1.005–1.030)
Urobilinogen, Ur: 1 mg/dL (ref 0.2–1.0)
pH, UA: 5.5 (ref 5.0–7.5)

## 2020-05-16 NOTE — Patient Instructions (Addendum)
Your procedure is scheduled on: 05/22/20 Report to the Registration Desk on the 1st floor of the [facility]. To find out your arrival time, please call [phone] between 1PM - 3PM on: 05/19/20  REMEMBER: Instructions that are not followed completely may result in serious medical risk, up to and including death; or upon the discretion of your surgeon and anesthesiologist your surgery may need to be rescheduled.  Do not eat food after midnight the night before surgery.  No gum chewing, lozengers or hard candies.  TAKE THESE MEDICATIONS THE MORNING OF SURGERY WITH A SIP OF WATER:  - amLODipine (NORVASC) 10 MG tablet - cetirizine (ZYRTEC) 10 MG tablet - pantoprazole (PROTONIX) 40 MG table,(take one the night before and one on the morning of surgery - helps to prevent nausea after surgery.) - roflumilast (DALIRESP) 500 MCG TABS tablet - arformoterol (BROVANA) 15 MCG/2ML NEB - budesonide (PULMICORT) 0.5 mg/2 mL nebulizer solution  - fluticasone propionate (FLONASE) 50 mcg/actuation nasal spray   - albuterol (PROVENTIL HFA;VENTOLIN HFA) 108 (90 Base) MCG/ bring to the hospital.  One week prior to surgery: Stop Anti-inflammatories (NSAIDS) such as Advil, Aleve, Ibuprofen, Motrin, Naproxen, Naprosyn and Aspirin based products such as Excedrin, Goodys Powder, BC Powder.  Stop ANY OVER THE COUNTER supplements until after surgery. (However, you may continue taking Vitamin D, Vitamin B, and multivitamin up until the day before surgery.)  No Alcohol for 24 hours before or after surgery.  No Smoking including e-cigarettes for 24 hours prior to surgery.  No chewable tobacco products for at least 6 hours prior to surgery.  No nicotine patches on the day of surgery.  Do not use any "recreational" drugs for at least a week prior to your surgery.  Please be advised that the combination of cocaine and anesthesia may have negative outcomes, up to and including death. If you test positive for  cocaine, your surgery will be cancelled.  On the morning of surgery brush your teeth with toothpaste and water, you may rinse your mouth with mouthwash if you wish. Do not swallow any toothpaste or mouthwash.  Do not wear jewelry, make-up, hairpins, clips or nail polish.  Do not wear lotions, powders, or perfumes.   Do not shave body from the neck down 48 hours prior to surgery just in case you cut yourself which could leave a site for infection.  Also, freshly shaved skin may become irritated if using the CHG soap.  Contact lenses, hearing aids and dentures may not be worn into surgery.  Do not bring valuables to the hospital. [facility] is not responsible for any missing/lost belongings or valuables.   Notify your doctor if there is any change in your medical condition (cold, fever, infection).  Bring your C-PAP to the hospital with you in case you may have to spend the night.   Wear comfortable clothing (specific to your surgery type) to the hospital.  Plan for stool softeners for home use; pain medications have a tendency to cause constipation. You can also help prevent constipation by eating foods high in fiber such as fruits and vegetables and drinking plenty of fluids as your diet allows.  After surgery, you can help prevent lung complications by doing breathing exercises.  Take deep breaths and cough every 1-2 hours. Your doctor may order a device called an Incentive Spirometer to help you take deep breaths. When coughing or sneezing, hold a pillow firmly against your incision with both hands. This is called "splinting." Doing this  helps protect your incision. It also decreases belly discomfort.  If you are being admitted to the hospital overnight, leave your suitcase in the car. After surgery it may be brought to your room.  If you are being discharged the day of surgery, you will not be allowed to drive home. You will need a responsible adult (18 years or older) to drive  you home and stay with you that night.   If you are taking public transportation, you will need to have a responsible adult (18 years or older) with you. Please confirm with your physician that it is acceptable to use public transportation.   Please call the Pre-admissions Testing Dept. at [phone] if you have any questions about these instructions.  Visitation Policy:  Patients undergoing a surgery or procedure may have one family member or support person with them as long as that person is not COVID-19 positive or experiencing its symptoms.  That person may remain in the waiting area during the procedure.  Inpatient Visitation:    Visiting hours are 7 a.m. to 8 p.m. Patients will be allowed one visitor. The visitor may change daily. The visitor must pass COVID-19 screenings, use hand sanitizer when entering and exiting the patient's room and wear a mask at all times, including in the patient's room. Patients must also wear a mask when staff or their visitor are in the room. Masking is required regardless of vaccination status. Systemwide, no visitors 17 or younger.

## 2020-05-18 ENCOUNTER — Other Ambulatory Visit
Admission: RE | Admit: 2020-05-18 | Discharge: 2020-05-18 | Disposition: A | Discharge status: Home / Self Care | Payer: Medicare HMO | Source: Ambulatory Visit | Attending: Urology | Admitting: Urology

## 2020-05-18 ENCOUNTER — Other Ambulatory Visit: Payer: Self-pay

## 2020-05-18 DIAGNOSIS — Z20822 Contact with and (suspected) exposure to covid-19: Secondary | ICD-10-CM | POA: Diagnosis not present

## 2020-05-18 DIAGNOSIS — Z01812 Encounter for preprocedural laboratory examination: Secondary | ICD-10-CM | POA: Insufficient documentation

## 2020-05-18 LAB — SARS CORONAVIRUS 2 (TAT 6-24 HRS): SARS Coronavirus 2: NEGATIVE

## 2020-05-18 LAB — CULTURE, URINE COMPREHENSIVE

## 2020-05-19 ENCOUNTER — Telehealth: Payer: Self-pay

## 2020-05-19 NOTE — Telephone Encounter (Signed)
Patient daughter called stating pt is urinating blood. As Per Sam, PA. As long as he is not having difficulty urinating, unable to void, Thick like urine with blood, or blood clots it is okay. PT daughter aware and verbalized understanding.

## 2020-05-22 ENCOUNTER — Encounter
Admission: RE | Disposition: A | Discharge status: Home / Self Care | Payer: Self-pay | Source: Home / Self Care | Attending: Urology

## 2020-05-22 ENCOUNTER — Ambulatory Visit: Payer: Medicare HMO | Admitting: Registered Nurse

## 2020-05-22 ENCOUNTER — Ambulatory Visit
Admission: RE | Admit: 2020-05-22 | Discharge: 2020-05-22 | Disposition: A | Discharge status: Home / Self Care | Payer: Medicare HMO | Attending: Urology | Admitting: Urology

## 2020-05-22 ENCOUNTER — Encounter: Payer: Self-pay | Admitting: Urology

## 2020-05-22 ENCOUNTER — Ambulatory Visit: Payer: Medicare HMO

## 2020-05-22 ENCOUNTER — Other Ambulatory Visit: Payer: Self-pay

## 2020-05-22 DIAGNOSIS — R310 Gross hematuria: Secondary | ICD-10-CM | POA: Insufficient documentation

## 2020-05-22 DIAGNOSIS — C674 Malignant neoplasm of posterior wall of bladder: Secondary | ICD-10-CM | POA: Insufficient documentation

## 2020-05-22 DIAGNOSIS — J449 Chronic obstructive pulmonary disease, unspecified: Secondary | ICD-10-CM | POA: Insufficient documentation

## 2020-05-22 DIAGNOSIS — Z9981 Dependence on supplemental oxygen: Secondary | ICD-10-CM | POA: Diagnosis not present

## 2020-05-22 DIAGNOSIS — C679 Malignant neoplasm of bladder, unspecified: Secondary | ICD-10-CM

## 2020-05-22 DIAGNOSIS — D494 Neoplasm of unspecified behavior of bladder: Secondary | ICD-10-CM | POA: Diagnosis not present

## 2020-05-22 HISTORY — PX: TRANSURETHRAL RESECTION OF BLADDER TUMOR WITH MITOMYCIN-C: SHX6459

## 2020-05-22 SURGERY — TRANSURETHRAL RESECTION OF BLADDER TUMOR WITH MITOMYCIN-C
Anesthesia: General

## 2020-05-22 MED ORDER — FENTANYL CITRATE (PF) 100 MCG/2ML IJ SOLN: 25.0000 ug | INTRAMUSCULAR | Status: DC | PRN

## 2020-05-22 MED ORDER — GEMCITABINE CHEMO FOR BLADDER INSTILLATION 2000 MG
INTRAVENOUS | Status: DC | PRN
  Administered 2020-05-22: 2000 mg via INTRAVESICAL

## 2020-05-22 MED ORDER — HYDROCODONE-ACETAMINOPHEN 5-325 MG PO TABS: 1.0000 | ORAL_TABLET | Freq: Four times a day (QID) | ORAL | 0 refills | Status: DC | PRN

## 2020-05-22 MED ORDER — KETAMINE HCL 50 MG/5ML IJ SOSY
PREFILLED_SYRINGE | INTRAMUSCULAR | Status: AC
  Filled 2020-05-22: qty 5

## 2020-05-22 MED ORDER — DEXMEDETOMIDINE HCL 200 MCG/2ML IV SOLN
INTRAVENOUS | Status: DC | PRN
  Administered 2020-05-22: 8 ug via INTRAVENOUS
  Administered 2020-05-22: 12 ug via INTRAVENOUS

## 2020-05-22 MED ORDER — ORAL CARE MOUTH RINSE: 15.0000 mL | Freq: Once | OROMUCOSAL | Status: AC

## 2020-05-22 MED ORDER — LIDOCAINE HCL (CARDIAC) PF 100 MG/5ML IV SOSY
PREFILLED_SYRINGE | INTRAVENOUS | Status: DC | PRN
  Administered 2020-05-22: 80 mg via INTRAVENOUS
  Administered 2020-05-22: 20 mg via INTRAVENOUS

## 2020-05-22 MED ORDER — PROPOFOL 10 MG/ML IV BOLUS
INTRAVENOUS | Status: DC | PRN
  Administered 2020-05-22: 130 mg via INTRAVENOUS

## 2020-05-22 MED ORDER — OXYBUTYNIN CHLORIDE 5 MG PO TABS: 5.0000 mg | ORAL_TABLET | Freq: Three times a day (TID) | ORAL | 0 refills | Status: DC | PRN

## 2020-05-22 MED ORDER — ONDANSETRON HCL 4 MG/2ML IJ SOLN
INTRAMUSCULAR | Status: AC
  Filled 2020-05-22: qty 2

## 2020-05-22 MED ORDER — ONDANSETRON HCL 4 MG/2ML IJ SOLN
INTRAMUSCULAR | Status: DC | PRN
  Administered 2020-05-22: 4 mg via INTRAVENOUS

## 2020-05-22 MED ORDER — LACTATED RINGERS IV SOLN: INTRAVENOUS | Status: DC

## 2020-05-22 MED ORDER — ACETAMINOPHEN 10 MG/ML IV SOLN
INTRAVENOUS | Status: DC | PRN
  Administered 2020-05-22: 1000 mg via INTRAVENOUS

## 2020-05-22 MED ORDER — CEFAZOLIN SODIUM-DEXTROSE 2-4 GM/100ML-% IV SOLN
INTRAVENOUS | Status: AC
  Filled 2020-05-22: qty 100

## 2020-05-22 MED ORDER — CEFAZOLIN SODIUM-DEXTROSE 2-4 GM/100ML-% IV SOLN
2.0000 g | INTRAVENOUS | Status: AC
  Administered 2020-05-22: 2 g via INTRAVENOUS

## 2020-05-22 MED ORDER — DEXAMETHASONE SODIUM PHOSPHATE 10 MG/ML IJ SOLN
INTRAMUSCULAR | Status: AC
  Filled 2020-05-22: qty 1

## 2020-05-22 MED ORDER — ACETAMINOPHEN 10 MG/ML IV SOLN
INTRAVENOUS | Status: AC
  Filled 2020-05-22: qty 100

## 2020-05-22 MED ORDER — PROPOFOL 10 MG/ML IV BOLUS
INTRAVENOUS | Status: AC
  Filled 2020-05-22: qty 20

## 2020-05-22 MED ORDER — ONDANSETRON HCL 4 MG/2ML IJ SOLN: 4.0000 mg | Freq: Once | INTRAMUSCULAR | Status: DC | PRN

## 2020-05-22 MED ORDER — FENTANYL CITRATE (PF) 100 MCG/2ML IJ SOLN
INTRAMUSCULAR | Status: DC | PRN
  Administered 2020-05-22 (×3): 25 ug via INTRAVENOUS

## 2020-05-22 MED ORDER — PROPOFOL 500 MG/50ML IV EMUL
INTRAVENOUS | Status: AC
  Filled 2020-05-22: qty 50

## 2020-05-22 MED ORDER — CHLORHEXIDINE GLUCONATE 0.12 % MT SOLN: 15.0000 mL | Freq: Once | OROMUCOSAL | Status: AC

## 2020-05-22 MED ORDER — CHLORHEXIDINE GLUCONATE 0.12 % MT SOLN
OROMUCOSAL | Status: AC
  Administered 2020-05-22: 15 mL via OROMUCOSAL
  Filled 2020-05-22: qty 15

## 2020-05-22 MED ORDER — FENTANYL CITRATE (PF) 100 MCG/2ML IJ SOLN
INTRAMUSCULAR | Status: AC
  Filled 2020-05-22: qty 2

## 2020-05-22 MED ORDER — KETAMINE HCL 10 MG/ML IJ SOLN
INTRAMUSCULAR | Status: DC | PRN
  Administered 2020-05-22 (×2): 5 mg via INTRAVENOUS
  Administered 2020-05-22: 30 mg via INTRAVENOUS

## 2020-05-22 MED ORDER — PHENYLEPHRINE HCL (PRESSORS) 10 MG/ML IV SOLN
INTRAVENOUS | Status: DC | PRN
  Administered 2020-05-22: 100 ug via INTRAVENOUS

## 2020-05-22 MED ORDER — DEXAMETHASONE SODIUM PHOSPHATE 10 MG/ML IJ SOLN
INTRAMUSCULAR | Status: DC | PRN
  Administered 2020-05-22: 5 mg via INTRAVENOUS

## 2020-05-22 MED ORDER — DEXMEDETOMIDINE (PRECEDEX) IN NS 20 MCG/5ML (4 MCG/ML) IV SYRINGE
PREFILLED_SYRINGE | INTRAVENOUS | Status: AC
  Filled 2020-05-22: qty 5

## 2020-05-22 SURGICAL SUPPLY — 31 items
BAG DRAIN CYSTO-URO LG1000N (MISCELLANEOUS) ×2 IMPLANT
BAG DRN RND TRDRP ANRFLXCHMBR (UROLOGICAL SUPPLIES) ×1
BAG URINE DRAIN 2000ML AR STRL (UROLOGICAL SUPPLIES) ×2 IMPLANT
BRUSH SCRUB EZ  4% CHG (MISCELLANEOUS) ×1
BRUSH SCRUB EZ 4% CHG (MISCELLANEOUS) ×1 IMPLANT
CATH FOLEY 2WAY  5CC 16FR (CATHETERS) ×1
CATH FOLEY 2WAY 5CC 16FR (CATHETERS) ×1
CATH URTH 16FR FL 2W BLN LF (CATHETERS) ×1 IMPLANT
DRAPE UTILITY 15X26 TOWEL STRL (DRAPES) ×2 IMPLANT
DRSG TELFA 4X3 1S NADH ST (GAUZE/BANDAGES/DRESSINGS) ×2 IMPLANT
ELECT LOOP 22F BIPOLAR SML (ELECTROSURGICAL) ×2
ELECT REM PT RETURN 9FT ADLT (ELECTROSURGICAL) ×2
ELECTRODE LOOP 22F BIPOLAR SML (ELECTROSURGICAL) ×1 IMPLANT
ELECTRODE REM PT RTRN 9FT ADLT (ELECTROSURGICAL) ×1 IMPLANT
GLOVE SURG ENC MOIS LTX SZ6.5 (GLOVE) ×4 IMPLANT
GOWN STRL REUS W/ TWL LRG LVL3 (GOWN DISPOSABLE) ×2 IMPLANT
GOWN STRL REUS W/TWL LRG LVL3 (GOWN DISPOSABLE) ×4
IV NS IRRIG 3000ML ARTHROMATIC (IV SOLUTION) ×16 IMPLANT
KIT TURNOVER CYSTO (KITS) ×2 IMPLANT
LOOP CUT BIPOLAR 24F LRG (ELECTROSURGICAL) IMPLANT
MANIFOLD NEPTUNE II (INSTRUMENTS) IMPLANT
NDL SAFETY ECLIPSE 18X1.5 (NEEDLE) ×1 IMPLANT
NEEDLE HYPO 18GX1.5 SHARP (NEEDLE) ×2
PACK CYSTO AR (MISCELLANEOUS) ×2 IMPLANT
PAD ARMBOARD 7.5X6 YLW CONV (MISCELLANEOUS) ×2 IMPLANT
SET IRRIG Y TYPE TUR BLADDER L (SET/KITS/TRAYS/PACK) ×2 IMPLANT
SURGILUBE 2OZ TUBE FLIPTOP (MISCELLANEOUS) ×2 IMPLANT
SYR TOOMEY IRRIG 70ML (MISCELLANEOUS) ×2
SYRINGE TOOMEY IRRIG 70ML (MISCELLANEOUS) ×1 IMPLANT
WATER STERILE IRR 1000ML POUR (IV SOLUTION) ×2 IMPLANT
WATER STERILE IRR 3000ML UROMA (IV SOLUTION) IMPLANT

## 2020-05-22 NOTE — Anesthesia Preprocedure Evaluation (Signed)
Anesthesia Evaluation  Patient identified by MRN, date of birth, ID band Patient awake    Reviewed: Allergy & Precautions, H&P , NPO status , Patient's Chart, lab work & pertinent test results, reviewed documented beta blocker date and time   Airway Mallampati: II  TM Distance: >3 FB Neck ROM: full    Dental  (+) Teeth Intact   Pulmonary neg pulmonary ROS, shortness of breath, asthma , COPD, former smoker,    Pulmonary exam normal        Cardiovascular Exercise Tolerance: Poor hypertension, On Medications negative cardio ROS Normal cardiovascular exam Rate:Normal     Neuro/Psych negative neurological ROS  negative psych ROS   GI/Hepatic negative GI ROS, Neg liver ROS, GERD  Medicated,  Endo/Other  negative endocrine ROS  Renal/GU negative Renal ROS  negative genitourinary   Musculoskeletal   Abdominal   Peds  Hematology negative hematology ROS (+)   Anesthesia Other Findings   Reproductive/Obstetrics negative OB ROS                             Anesthesia Physical Anesthesia Plan  ASA: III  Anesthesia Plan: General LMA   Post-op Pain Management:    Induction:   PONV Risk Score and Plan: 3  Airway Management Planned:   Additional Equipment:   Intra-op Plan:   Post-operative Plan:   Informed Consent: I have reviewed the patients History and Physical, chart, labs and discussed the procedure including the risks, benefits and alternatives for the proposed anesthesia with the patient or authorized representative who has indicated his/her understanding and acceptance.       Plan Discussed with: CRNA  Anesthesia Plan Comments:         Anesthesia Quick Evaluation

## 2020-05-22 NOTE — Transfer of Care (Signed)
Immediate Anesthesia Transfer of Care Note  Patient: Joe Torres  Procedure(s) Performed: TRANSURETHRAL RESECTION OF BLADDER TUMOR WITH Gemcitabine (N/A )  Patient Location: PACU  Anesthesia Type:General  Level of Consciousness: drowsy  Airway & Oxygen Therapy: Patient Spontanous Breathing and Patient connected to face mask oxygen  Post-op Assessment: Report given to RN and Post -op Vital signs reviewed and stable  Post vital signs: Reviewed and stable  Last Vitals:  Vitals Value Taken Time  BP 120/70 05/22/20 1038  Temp    Pulse 72 05/22/20 1041  Resp 12 05/22/20 1041  SpO2 98 % 05/22/20 1041  Vitals shown include unvalidated device data.  Last Pain:  Vitals:   05/22/20 0802  TempSrc: Oral  PainSc: 0-No pain         Complications: No complications documented.

## 2020-05-22 NOTE — Discharge Instructions (Addendum)
Reseccin transuretral de tumor de vejiga, cuidados posteriores Transurethral Resection of Bladder Tumor, Care After Esta hoja le brinda informacin sobre cmo cuidarse despus del procedimiento. El mdico tambin podr darle indicaciones ms especficas. Comunquese con el mdico si tiene problemas o preguntas. Qu puedo esperar despus del procedimiento? Despus del procedimiento, es comn [employer] siguientes sntomas:  Una pequea cantidad de sangre en la orina durante hasta 2semanas.  Molestias o dolor leve debido al catter. Despus de que le retiren Print production planner, puede sentir [employer], especialmente al [employer].  Dolor en la zona inferior del abdomen. Siga estas indicaciones en su casa: Medicamentos  [employer] de venta libre y los recetados solamente como se lo haya indicado el mdico.  Si le recetaron un antibitico, tmelo como se lo haya indicado el mdico. No deje de tomar el antibitico aunque comience a sentirse mejor.  No conduzca durante 24horas si le administraron un sedante durante el procedimiento.  Pregntele al mdico si el medicamento que le recet: ? Hace que sea necesario no conducir ni usar maquinaria pesada. ? Puede causarle estreimiento. Es posible que tenga que tomar estas medidas para prevenir o tratar el estreimiento:  Tomar medicamentos recetados o de [employer].  Consumir alimentos ricos en fibra, como frijoles, cereales integrales, y frutas y verduras frescas.  Limitar su consumo de alimentos ricos en grasa y azcares procesados, como los alimentos fritos o dulces.   Actividad  Retome sus actividades normales como se lo haya indicado el mdico. Pregntele al mdico qu actividades son seguras para usted.  No levante ningn objeto que pese ms de 10libras (4.5kg) o que supere el lmite de peso que le hayan indicado, hasta que el mdico le diga que puede [place].  Evite la actividad fsica intensa durante el tiempo que le haya  indicado el mdico.  Haga reposo como se lo haya indicado el mdico.  Evite estar sentado durante largos perodos sin moverse. Levntese y camine un poco cada 1 a 2 horas. Esto es importante para mejorar el flujo sanguneo y la respiracin. Pida ayuda si se siente dbil o inestable. Indicaciones generales  No beba alcohol durante el tiempo que le haya indicado el mdico. Esto es muy importante si toma analgsicos recetados.  No tome baos de inmersin, no nade ni use el jacuzzi hasta que el mdico lo autorice. Pregntele al mdico si puede ducharse. Thurston Pounds solo le permitan darse baos de [place].  Si tiene Science writer, siga las indicaciones del mdico sobre el cuidado del catter y de la bolsa de drenaje.  Beba suficiente lquido como para Theatre manager la orina de color amarillo plido.  Use medias de compresin como se lo haya indicado su mdico. Estas medias ayudan a evitar la formacin de cogulos de [place] y a reducir la hinchazn de las piernas.  Concurra a todas las visitas de seguimiento como se lo haya indicado el mdico. Esto es importante. ? Se le deber hacer un seguimiento riguroso con controles peridicos de la vejiga y Geologist, engineering (cistoscopias) para asegurarse de que el cncer no haya regresado.   Comunquese con un mdico si:  Tiene un dolor que empeora o que no mejora con los medicamentos.  Tiene sangre en la orina durante ms de 2semanas.  La orina es turbia o tiene mal olor.  Tiene estreimiento. Entre los signos de estreimiento, se incluyen los siguientes: ? Defecar menos de tres veces por semana. ? Tener dificultades para defecar. ? Tener las heces secas y [place], [place]  ms grandes que lo normal.  Irene Shipper. Solicite ayuda inmediatamente si:  Tiene lo siguiente: ? Dolor intenso. ? Sangre de color rojo brillante en la orina. ? Cogulos de [employer]. ? Mucha sangre en la orina.  Le han retirado el catter y no puede Garment/textile technologist.  Tiene colocado un catter,  pero este no drena la orina. Resumen  Despus del procedimiento, es comn tener una pequea cantidad de sangre en la orina, sensibilidad o dolor leve debido al catter y Social research officer, government en la zona inferior del abdomen.  Tome los medicamentos de venta libre y los recetados solamente como se lo haya indicado el mdico.  Haga reposo como se lo haya indicado el [place] indicaciones del mdico respecto de cmo retomar sus actividades habituales. Pregunte qu actividades son seguras para usted.  Si tiene Science writer, siga las indicaciones del mdico sobre el cuidado del catter y de la bolsa de drenaje.  Solicite ayuda de inmediato si no puede orinar, si tiene dolor intenso o si hay sangre de color rojo brillante o cogulos de [employer]. Esta informacin no tiene Marine scientist el consejo del mdico. Asegrese de hacerle al mdico cualquier pregunta que tenga. Document Revised: 01/01/2018 Document Reviewed: 01/01/2018 Elsevier Patient Education  2021 [place]   1) The drugs that you were given will stay in your system until tomorrow so for the next 24 hours you should not:  A) Drive an automobile B) Make any legal decisions C) Drink any alcoholic beverage   2) You may resume regular meals tomorrow.  Today it is better to start with liquids and gradually work up to solid foods.  You may eat anything you prefer, but it is better to start with liquids, then soup and crackers, and gradually work up to solid foods.   3) Please notify your doctor immediately if you have any unusual bleeding, trouble breathing, redness and pain at the surgery site, drainage, fever, or pain not relieved by medication.    4) Additional Instructions:        Please contact your physician with any problems or Same Day Surgery at [phone], Monday through Friday 6 am to 4 pm, or [place] at [facility] number at  [phone].     CIRUGIA AMBULATORIA       Instruccionnes de alta       1.  Las drogas que se Statistician en su cuerpo [employer], asi            que por las proximas 24 horas usted no debe:   Conducir Scientist, research (medical)) un automovil   Hacer ninguna decision legal   Tomar ninguna bebida alcoholica  2.  A) [place] puede comenzar una dieta regular.  Es mejor que hoy empiece con           liquidos y gradualmente anada comidas solidas.       B) Puede comer cualquier comida que desee pero es mejor empezar con liquidos,                      luego sopitas con galletas saladas y gradualmente llegar a las comidas solidas.  3.  Por favor avise a su medico inmediatamente si usted tiene algun sangrado anormal,       tiene dificultad con la respiracion, enrojecimiento y [employer] sitio de la [place], [place],       fiebro o  dolor que se Product/process development scientist.

## 2020-05-22 NOTE — Interval H&P Note (Signed)
H&P up-to-date  Regular rate and rhythm Clear to auscultation bilaterally  All questions answered today with interpreter present.

## 2020-05-22 NOTE — Anesthesia Procedure Notes (Signed)
Procedure Name: LMA Insertion Date/Time: 05/22/2020 9:12 AM Performed by: Lia Foyer, CRNA Pre-anesthesia Checklist: Patient identified, Emergency Drugs available, Suction available and Patient being monitored Patient Re-evaluated:Patient Re-evaluated prior to induction Oxygen Delivery Method: Circle system utilized Preoxygenation: Pre-oxygenation with 100% oxygen Induction Type: IV induction Ventilation: Mask ventilation without difficulty LMA Size: 3.5 Tube type: Oral Number of attempts: 1 Airway Equipment and Method: Stylet and Oral airway Placement Confirmation: positive ETCO2 and breath sounds checked- equal and bilateral Tube secured with: Tape Dental Injury: Teeth and Oropharynx as per pre-operative assessment

## 2020-05-22 NOTE — Op Note (Signed)
Date of procedure: 05/22/20  Preoperative diagnosis:  1. Gross hematuria 2. 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 same  Procedure: 1. TURBT, medium 2. Clot evacuation 3. Instillation of intravesical chemotherapy  Surgeon: Hollice Espy, MD  Anesthesia: General  Complications: None  Intraoperative findings: At least 2.5 cm posterior bladder tumor on a relatively small stock with almost necrotic/hemorrhagic material.  Additional tumor adjacent to that, more papillary in nature a more broad-based arc with a more traditional TCC appearance.  Finally, there is irregular approximately 2 cm nodular area on the left lateral bladder wall which almost had a submucosal solid infiltrative appearance.  Significant bladder varices and hypervascularity surrounding these tumors.  Bilateral UOs patent.  Mild trabeculation.  EBL: Minimal  Specimens: Bladder tumor chips, deep base of posterior bladder wall tumor  Drains: 16 French Foley catheter  Indication: Joe Torres is a 75 y.o. patient with intermittent gross hematuria found to have multiple bladder tumors.  After reviewing the management options for treatment, he elected to proceed with the above surgical procedure(s). We have discussed the potential benefits and risks of the procedure, side effects of the proposed treatment, the likelihood of the patient achieving the goals of the procedure, and any potential problems that might occur during the procedure or recuperation. Informed consent has been obtained.  Description of procedure:  The patient was taken to the operating room and general anesthesia was induced.  The patient was placed in the dorsal lithotomy position, prepped and draped in the usual sterile fashion, and preoperative antibiotics were administered. A preoperative time-out was performed.   A 26 French resectoscope was advanced using a blunt angled obturator into the bladder.  The bladder is  carefully inspected.  This revealed 3 discrete tumors.  The largest of which measured 2.5 cm with adherent well-formed clot on the posterior bladder wall measuring at least 2.5 cm.  Some of this appeared to be relatively solid and others appear to be necrotic hemorrhagic possibly organized clot.  This particular tumor was on a relatively fine stalk.  More than likely, this represented both a solid tumor perhaps it should had outgrown his blood supply and become necrotic/coagulated or had significant adherent organized clot.  Adjacent to this just to the left, there was a more traditional papillary type tumor measuring approximately 1.5 cm which is relatively broad-based very hypervascular.  There was significant varices and hypervascularity of the bladder coursing to each of these tumors.  There was a third much different appearing tumor on the left lateral bladder wall in close proximity to the left UO extending all the way to the left bladder neck at least 2 cm in diameter.  This had almost a submucosal or nodule and solid appearance different from the other 2 tumors.  This was highly concerning for high-grade possibly muscle invasive tumor.  The bipolar resectoscope was then used to resect each of these tumors.  I started with the largest tumor in the posterior bladder wall.  Is able to get down to the fine stock of this tumor and actually avulsed this tumor off from its base.  The tumor was large and spherical, dark and possibly represented well organized clot in addition to some tumor burden.  Because of its large size, I did use the bipolar loop dependent down and resected it piece wise in order to be able to irrigated from the bladder.  He is cold cup biopsy forceps to resect the base of this  tumor and sent that off separately as the deep base of this largest tumor but on the fine a stalk.  Next, the bipolar loop was used to take down some traditional adjacent tumor just to the left of this posterior  bladder wall tumor.  It had a more traditional papillary type appearance on a relatively broad base and was quite vascular.  Finally, the bipolar loop was used to resect the nodular tumor on the left lateral bladder wall and bladder neck.  This was relatively different from the other 2 tumors as it was very solid without any papillary features and did seem to go quite deep into the lining of the bladder.  In the central most portion of this tumor, I am not certain that the entirety of the tumor was resected as it still had a yellowish and solid appearance.  I was hesitant to resect any further due to concern for obturator reflex as well as how deep the resection was at this point.  It took some time to clear all the tumor chips from the bladder which had to be trimmed down.  I used a Toomey syringe to evacuate these tumor chips as well as clot from the bladder.  Ultimately, I was able to clear all of the chips and careful and adequate hemostasis was achieved.  Both UOs were visualized and were effluxing clear yellow urine.  Finally the scope was removed and a 16 French Foley catheter was placed.  The balloon was filled with 10 cc of sterile water.  The patient was then cleaned and dried, repositioned in supine position, versa myesthesia, and taken to the PACU in stable condition.  2000 mg of intravesical gemcitabine was instilled into the bladder.  Was allowed to dwell in the PACU for 1 hour.  This was well-tolerated.  After 1 hour, the chemo was drained and the catheter was removed.  I like to have him follow-up with me next week in clinic to review pathology.  I am highly suspicious that he may have invasive bladder cancer based on the intraoperative findings.  At minimum, he will likely need reresection/second look along with further treatment.  Hollice Espy, M.D.

## 2020-05-22 NOTE — OR Nursing (Signed)
Per Dr. Erlene Quan ([facility] room staff), patient does not need to void prior to discharge.

## 2020-05-23 ENCOUNTER — Encounter: Payer: Self-pay | Admitting: Urology

## 2020-05-23 ENCOUNTER — Ambulatory Visit (INDEPENDENT_AMBULATORY_CARE_PROVIDER_SITE_OTHER): Payer: Medicare HMO | Admitting: Urology

## 2020-05-23 DIAGNOSIS — R339 Retention of urine, unspecified: Secondary | ICD-10-CM

## 2020-05-23 DIAGNOSIS — C679 Malignant neoplasm of bladder, unspecified: Secondary | ICD-10-CM

## 2020-05-23 LAB — SURGICAL PATHOLOGY

## 2020-05-23 LAB — BLADDER SCAN AMB NON-IMAGING

## 2020-05-23 MED ORDER — TAMSULOSIN HCL 0.4 MG PO CAPS
0.4000 mg | ORAL_CAPSULE | Freq: Every day | ORAL | 0 refills | Status: DC
Start: 2020-05-23 — End: 2020-09-15

## 2020-05-23 NOTE — Progress Notes (Signed)
Interpreter ID# [id number]  Patient present today unable to void since 2am this morning post TURBT yesterday. Very uncomfortable, bladder scan 755ml. Per Dr. Erlene Quan 22FR coude to be placed  Simple Catheter Placement  Due to urinary retention patient is present today for a foley cath placement.  Patient was cleaned and prepped in a sterile fashion with betadine. A 22 coude FR foley catheter was inserted, urine return was noted  862ml, urine was yellow in color.  The balloon was filled with 10cc of sterile water.  A night bag was attached for drainage. Patient was also given a leg bag to take home and was given instruction on how to change from one bag to another.  Patient was given instruction on proper catheter care.  Patient tolerated well, no complications were noted Catheter was irrigated with 157ml of sterile water no clots or tissue was noted urine was clear.   Performed by: Fonnie Jarvis, CMA  Additional notes/ Follow up: 1 week post op V &T. Per Dr. Erlene Quan patient to start Tamsulosin. Script sent into pharmacy. Patient's daughter also present at visit today and given instructions for catheter care

## 2020-05-23 NOTE — Patient Instructions (Signed)
Foley Catheter Care and Patient Education  Perform catheter care every day.  You can do this while in the shower, but NOT while taking a tub bath.  You will need the following supplies: -mild soap, such as Dove -water -a clean washcloth (not one already used for bathing) or a 4x4 piece of gauze -1 Cath-Secure -night drainage bag -2 alcohol swabs  1. Was you hands thoroughly with soap and water 2. Using mild soap and water, clan your genital area a. Men should retract the foreskin, if needed, and clean the area, including the penis b. Women should separate the labia, and clean the area from front to back  3. Clean your urinary opening, which is where the catheter enters your body. 4. Clean the catheter from where it enters your body and then down, away from your body.  Hold the catheter at the point it enters your body so that you don't put tension on it. 5. Rinse the area well and dry it gently.  Changing the drainage bag You will change your drainage bag twice a day -in the morning after you shower, change he night bag to the leg bag -at night before you go to bed, change the leg bag to the night bag  1. Wash your hands thoroughly with soap and water 2. Empty the urine from the drainage bag into the toilet before you change it 3. Pinch off the catheter with your fingers and disconnect the used bag 4. Wipe the end of the catheter using an alcohol pad 5. Wipe the connector on the bag using the second alcohol pad 6. Connect the clean bag to the catheter and release your finger pinch 7. Check all connections.  Straighten any kinks or twits in the tubing  Caring for the Leg bag -always wear the leg bag below your knee.  This will help it drain. -keep the leg bag secure with the velcro straps.  If the straps leave a mark on your leg they are to tight and should be loosened.  Leaving the straps too tight can decrease you circulation and lead to blood clots. -empty the leg bag through  the spout at the bottom every 2-4 hours as needed.  Do not let the bag become completely full. -do not lie down for longer than 2 hours while you are wearing the leg bag.  Caring for the Night Bag -always keep the night bag below the level of your bladder . -to hang your night bag while you sleep, place a clean plastic bag inside of a wastebasket.  Hang the night bag on the inside of the wastebasket.  Cleaning the Drainage bag 1. Wash you hands thoroughly with soap and water. 2. Rinse the equipment with cool water.  Do not use hot water it can damage the plastic equipment. 3. Was the equipment with a mild liquid detergent (ivory) and rinse with cool water. 4. To decrease odor, fill the bag halfway with a mixture of 1 part vinegar and 3 parts water. Shake the bag and let it sit for 15 minutes. 5. Rinse the bag with cool water and hang it up to dry.  Preventing infection -keep the drainage bag below the level of your bladder and off the floor at all times. -keep the catheter secured to your thigh to prevent it from moving. -do not lie on or block the flow of urine in the tubing. -shower daily to keep the catheter clean. -clean your hands before and after touching  the catheter or bag. -the spout of the drainage bag should never touch the side of the toilet or any emptying container.  Special Points -You may see some blood or urine around where the catheter enters your body, especially when walking or having a bowel movement.  This is normal, as long as there is urine draining into the drainage bag.  If you experience significant leakage around  catheter tube where it enters your urethra possibly associated with lower abdominal cramping you could be having a bladder spasm.  Please notify your doctor and we can prescribe you a medication to improve your symptoms. -drink 1-2 glasses of liquids every 2 hours while you're awake.  Call your doctor immediately if -your catheter comes out, do not try  to replace it yourself -you have temperature of 101F (38.8C) or higher -you have decrease in the amount of urine you are making -you have foul-smelling urine -you have bright red blood or large blood clots in your urine -you have abdominal pain and no urine in your catheter bag    Cuidados para un catter Joe Torres, en adultos Indwelling Urinary Catheter Care, Adult Un catter urinario permanente es un tubo delgado que se inserta en la vejiga. Este tubo ayuda a Tour manager pis (la orina) del cuerpo. El tubo entra a travs de Geologist, engineering. La uretra es por donde sale la orina del cuerpo. La orina saldr a Lawerance Cruel del catter [employer] bolsa (bolsa de drenaje). Cuide bien el catter de modo que funcione bien. Cmo usar Print production planner y la bolsa Materiales necesarios  Cinta adhesiva o una correa para pierna.  Pao con alcohol o agua y jabn (si [place]).  Una toalla limpia (si [place] cinta adhesiva).  Una bolsa grande para la noche.  Una bolsa pequea (bolsa de pierna). Para usar el catter [employer] catter a la pierna con cinta adhesiva o una correa para pierna.  Asegrese de que el catter no est tirante.  Si la correa para pierna se humedece, retrela y reemplcela por una seca.  Si [place] cinta adhesiva para sostener la bolsa en la pierna: 1. Use un pao con alcohol o agua y jabn para lavar la piel donde haya restos de [place] de la cinta adhesiva previa. 2. Use una toalla limpia para secar la piel sin frotar. 3. Use una nueva cinta adhesiva para sujetar la bolsa a su pierna. Para usar las bolsas Se le tiene que haber entregado una bolsa grande para la noche.  Puede usar la bolsa para la noche durante el da o la noche.  Siempre tenga la bolsa para la noche por debajo de la vejiga.  No deje que la bolsa toque el suelo.  Antes de ir a dormir, coloque una bolsa de plstico limpia en un cesto de basura. Luego, cuelgue la bolsa para la noche dentro del cesto de  basura. Tambin debe tener una bolsa de pierna ms pequea para usar debajo de la ropa.  Use siempre la bolsa de pierna por debajo de su rodilla.  No use la bolsa de pierna a la noche. Cmo cuidar la piel y el catter Materiales necesarios  Un pao limpio.  [place] y [place].  Una toalla limpia. Para cuidar la piel y el catter  Limpie la piel alrededor del catter [employer]: 1. Lvese las manos con agua y [place]. 2. Moje un pao limpio con agua tibia y [place]. 3. Limpie la piel alrededor de la uretra.  Si es mujer:  Abra suavemente los pliegues de piel alrededor de la vagina (labios de la vulva).  Con el pao en la otra mano, limpie el lado interior de los labios de la vulva de cada lado. Higiencese de adelante hacia atrs.  Si es hombre:  Empuje hacia atrs la piel que cubre el extremo del pene (prepucio).  Con el pao en la otra mano, limpie el pene haciendo crculos pequeos. Comience a limpiar la punta del pene, luego limpie alejndose del catter.  Mueva el prepucio a Chief of Staff, si es necesario. 4. Con la otra mano, sostenga el catter cerca de donde entra en el cuerpo.  Siga sosteniendo el catter mientras limpia la zona de modo de no tirar y Actuary. 5. Con el pao en la otra mano, limpie el catter.  Solo limpie el catter con movimientos hacia abajo.  No limpie hacia arriba, hacia el cuerpo. Esto puede hacer que ingresen grmenes en la uretra y provocar una infeccin. 6. Use una toalla limpia para secar el catter y la piel a su alrededor sin frotar. Asegrese de [employer] restos de [place]. 7. Lvese las manos con agua y [place].  Dchese a diario. No tome baos de inmersin.  No use cremas, ungentos o lociones en la zona donde el catter ingresa en el cuerpo, a menos que se lo indique el mdico.  No use talcos, aerosoles ni lociones en la zona genital.  Controle la piel alrededor del catter [employer] para detectar signos de  infeccin. Est atento a los siguientes signos: ? Enrojecimiento, hinchazn o dolor. ? Lquido o sangre. ? Calor. ? Pus o mal olor.      Cmo vaciar la bolsa Materiales necesarios  Alcohol rectificado.  Apsito de gasa o bola de algodn.  Cinta adhesiva o correa para pierna. Para vaciar la bolsa Saque la orina de la bolsa cuando esta se haya llenado hasta la mitad, o por lo menos 2 o 3veces al [employer]. Haga esto para la bolsa para la noche y la bolsa de pierna. 1. Lvese las manos con agua y [place]. 2. Separe (desprenda) la bolsa de su pierna. 3. Sostenga la bolsa sobre el inodoro o una cubeta limpia. [place] bolsa por debajo de las caderas y vejiga. Esto es para que el pis (la orina) no vuelva a ingresar al tubo. [place] vertedor. Se encuentra en el fondo de la bolsa. 5. Vace la orina en el inodoro o cubeta. No permita que el pico vertedor toque ninguna superficie. 6. Coloque alcohol rectificado en el apsito de gasa o la bola de algodn. 7. Use el apsito de gasa o la bola de algodn para Tax inspector. [place] vertedor. 9. Sujete la bolsa a su pierna con cinta adhesiva o una correa para pierna. [place] con agua y [place]. Siga las instrucciones para limpiar la bolsa de drenaje:  Del fabricante del producto.  Como se lo haya indicado el mdico. Cmo cambiar la bolsa Materiales necesarios  Paos con alcohol.  Una bolsa limpia.  Cinta adhesiva o correa para pierna. Para cambiar la bolsa Reemplcela cuando comience a gotear, oler mal o tener un aspecto sucio. 1. Lvese las manos con agua y [place]. 2. Separe la bolsa sucia de su pierna. 3. Apriete el catter con los dedos para que no se derrame la orina. 4. Separe el tubo del catter del tubo de la bolsa en donde se conectan ambos tubos (  en la vlvula de conexin). No permita que las sondas toquen ninguna superficie. 5. Limpie el extremo del catter con un pao con alcohol. Use otro  pao con alcohol para limpiar el extremo del tubo de la bolsa. 6. Conecte el tubo del catter al tubo de la bolsa limpia. 7. Sujete la bolsa limpia a su pierna con cinta adhesiva o una correa para pierna. No ajuste demasiado la bolsa sobre la pierna. 8. Lvese las manos con agua y [place]. Normas generales  Nunca tire del catter. Nunca intente sacarlo. Hacer eso podra lastimarlo.  Lvese siempre las manos antes y despus de tocar el catter o la bolsa. Use un Ashok Cordia y sin perfume. Use un desinfectante para manos si no dispone de [place] y [place].  Asegrese siempre de que no haya torceduras (pliegues) en el tubo del catter.  Asegrese siempre de que no haya filtraciones en el catter ni en la bolsa.  Beba suficiente lquido como para Theatre manager la orina de color amarillo plido.  No tome baos de inmersin, no practique natacin ni utilice el jacuzzi.  Si es [place], higiencese de adelante hacia atrs despus de defecar (tener una deposicin).   Comunquese con un mdico si:  Su orina es turbia.  La orina [employer] lo habitual.  El catter se obstruye.  El catter gotea.  Siente que su vejiga est llena. Solicite ayuda inmediatamente si:  Tiene enrojecimiento, hinchazn o dolor donde el catter ingresa en el cuerpo.  Tiene lquido, sangre, pus o mal olor del rea donde el catter ingresa en el cuerpo.  La piel del rea donde el catter ingresa en el cuerpo se siente caliente.  Tiene fiebre.  Siente dolor en: ? El vientre (abdomen). ? Las piernas. ? La parte inferior de la espalda. ? La vejiga.  Observa sangre en el catter.  Su orina es de color rosa o rojo.  Siente malestar estomacal (nuseas).  Vomita.  Tiene escalofros.  Su orina no drena dentro de [employer].  El catter se Therapist, occupational. Resumen  Un catter [employer] es un tubo delgado que se coloca en el interior de la vejiga para ayudar a Musician pis (orina) fuera del cuerpo.  El  catter se coloca dentro de la parte del cuerpo que drena la orina de la vejiga (uretra).  Si cuida adecuadamente del catter, har que funcione de [place] y ayudar a Product/process development scientist que surjan problemas.  Lvese siempre las manos antes y despus de tocar el catter o la bolsa.  Nunca tire del catter ni trate de quitarlo. Esta informacin no tiene Marine scientist el consejo del mdico. Asegrese de hacerle al mdico cualquier pregunta que tenga. Document Revised: 07/10/2018 Document Reviewed: 07/10/2018 Elsevier Patient Education  2021 [employer].

## 2020-05-23 NOTE — Progress Notes (Signed)
05/23/2020 11:29 AM   Joe Torres 07-15-1945 [id number]  Referring provider: Center, Joe Torres,  Joe Torres [id number]  Chief Complaint  Patient presents with  . Urinary Retention    HPI: 75 year old male with multifocal bladder cancer status post TURBT yesterday who presents urgently to clinic today with inability to void.  He is accompanied today by his daughter.  They report that he was able to void a small amount yesterday but today, unable to urinate whatsoever.  Bladder scan indicated urinary retention.  A Foley catheter was placed with drainage of approximately 700 cc of clear yellow urine without clot.  Please see CMA note.  No history of urinary difficulty other than intermittent gross hematuria as previously outlined or urinary retention.  He does state that he is feels relieved now that the catheter is in.   PMH: Past Medical History:  Diagnosis Date  . Arthritis   . Asthma   . Cancer ([place])   . COPD (chronic obstructive pulmonary disease) ([place])   . Dyspnea   . GERD (gastroesophageal reflux disease)   . Hypertension     Surgical History: Past Surgical History:  Procedure Laterality Date  . COLONOSCOPY    . HERNIA REPAIR  2017  . TRANSURETHRAL RESECTION OF BLADDER TUMOR WITH MITOMYCIN-C N/A 05/22/2020   Procedure: TRANSURETHRAL RESECTION OF BLADDER TUMOR WITH Gemcitabine;  Surgeon: Hollice Espy, MD;  Location: [facility] ORS;  Service: Urology;  Laterality: N/A;    Home Medications:  Allergies as of 05/23/2020      Reactions   Shrimp [shellfish Allergy] Other (See Comments)   Per MD comments critical      Medication List       Accurate as of May 23, 2020 11:29 AM. If you have any questions, ask your nurse or doctor.        acetaminophen 500 MG tablet Commonly known as: TYLENOL Take 500 mg by mouth every 8 (eight) hours as needed for moderate pain.   albuterol 108 (90 Base) MCG/ACT  inhaler Commonly known as: VENTOLIN HFA Inhale 2 puffs into the lungs every 4 (four) hours as needed for wheezing or shortness of breath.   albuterol (2.5 MG/3ML) 0.083% nebulizer solution Commonly known as: PROVENTIL Take 2.5 mg by nebulization every 4 (four) hours as needed for wheezing or shortness of breath.   amLODipine 10 MG tablet Commonly known as: NORVASC Take 10 mg by mouth daily.   arformoterol 15 MCG/2ML Nebu Commonly known as: BROVANA Take 15 mcg by nebulization 2 (two) times daily.   cetirizine 10 MG tablet Commonly known as: ZYRTEC Chew 10 mg by mouth daily.   diclofenac Sodium 1 % Gel Commonly known as: VOLTAREN Apply 1 application topically 2 (two) times daily as needed (pain).   hydrochlorothiazide 25 MG tablet Commonly known as: HYDRODIURIL Take 25 mg by mouth daily.   HYDROcodone-acetaminophen 5-325 MG tablet Commonly known as: NORCO/VICODIN Take 1-2 tablets by mouth every 6 (six) hours as needed for moderate pain.   montelukast 10 MG tablet Commonly known as: SINGULAIR Take 10 mg by mouth at bedtime.   Nucala 100 MG Solr Generic drug: Mepolizumab Inject 100 mg into the skin every 30 (thirty) days.   oxybutynin 5 MG tablet Commonly known as: DITROPAN Take 1 tablet (5 mg total) by mouth every 8 (eight) hours as needed for bladder spasms.   pantoprazole 40 MG tablet Commonly known as: PROTONIX Take 40 mg by mouth daily.  roflumilast 500 MCG Tabs tablet Commonly known as: DALIRESP Take 500 mcg by mouth daily.   simvastatin 20 MG tablet Commonly known as: ZOCOR Take 20 mg by mouth daily.   tamsulosin 0.4 MG Caps capsule Commonly known as: FLOMAX Take 0.4 mg by mouth daily. What changed: Another medication with the same name was added. Make sure you understand how and when to take each. Changed by: Hollice Espy, MD   tamsulosin 0.4 MG Caps capsule Commonly known as: FLOMAX Take 1 capsule (0.4 mg total) by mouth daily. What changed:  You were already taking a medication with the same name, and this prescription was added. Make sure you understand how and when to take each. Changed by: Hollice Espy, MD   tiotropium 18 MCG inhalation capsule Commonly known as: SPIRIVA Place 18 mcg into inhaler and inhale daily.   traMADol 50 MG tablet Commonly known as: ULTRAM Take 50 mg by mouth every 6 (six) hours as needed for severe pain.       Allergies:  Allergies  Allergen Reactions  . Shrimp [Shellfish Allergy] Other (See Comments)    Per MD comments critical    Family History: No family history on file.  Social History:  reports that he quit smoking about 12 years ago. His smoking use included cigarettes. He has a 60.00 pack-year smoking history. He has never used smokeless tobacco. He reports previous alcohol use. He reports that he does not use drugs.   Physical Exam: Constitutional:  Alert and oriented, No acute distress.  Accompanied by daughter today.  Wearing oxygen. HEENT: [place] AT, moist mucus membranes.  Trachea midline, no masses. Cardiovascular: No clubbing, cyanosis, or edema. Respiratory: Normal respiratory effort, no increased work of breathing. GI: Abdomen is soft, nontender, nondistended, no abdominal masses GU: Normal circumcised phallus with Foley catheter in place draining clear yellow urine Skin: No rashes, bruises or suspicious lesions. Neurologic: Grossly intact, no focal deficits, moving all 4 extremities. Psychiatric: Normal mood and affect.  Results for orders placed or performed in visit on 05/23/20  BLADDER SCAN AMB NON-IMAGING  Result Value Ref Range   Scan Result 792ml      Assessment & Plan:    1. Urinary retention Postoperative urinary retention  Plan to leave the catheter in for a week with a voiding trial at his upcoming appointment to discuss pathology next week.  He is agreeable this plan.  Also go ahead and start him on Flomax for short-term for the next month or so.   We discussed possible side effects of orthostatic hypotension/dizziness since advised to stop this medication if he experiences any of the side effects.  All questions answered.  2. Malignant neoplasm of urinary bladder, unspecified site [facility]) Pathology pending, anticipate he will need further treatment or reresection based on intraoperative findings  - [place], MD  [facility] [address] [place], [place] [address] [phone]

## 2020-05-23 NOTE — Anesthesia Postprocedure Evaluation (Signed)
Anesthesia Post Note  Patient: Joe Torres  Procedure(s) Performed: TRANSURETHRAL RESECTION OF BLADDER TUMOR WITH Gemcitabine (N/A )  Patient location during evaluation: PACU Anesthesia Type: General Level of consciousness: awake and alert and oriented Pain management: pain level controlled Vital Signs Assessment: post-procedure vital signs reviewed and stable Respiratory status: spontaneous breathing Cardiovascular status: blood pressure returned to baseline Anesthetic complications: no   No complications documented.   Last Vitals:  Vitals:   05/22/20 1154 05/22/20 1212  BP: (!) 161/86 135/69  Pulse: 84 83  Resp: 16 18  Temp: (!) 36.4 C   SpO2: 97% 100%    Last Pain:  Vitals:   05/22/20 1212  TempSrc:   PainSc: 0-No pain                 Shiasia Porro

## 2020-05-30 ENCOUNTER — Other Ambulatory Visit: Payer: Self-pay

## 2020-05-30 ENCOUNTER — Encounter: Payer: Self-pay | Admitting: Urology

## 2020-05-30 ENCOUNTER — Ambulatory Visit (INDEPENDENT_AMBULATORY_CARE_PROVIDER_SITE_OTHER): Payer: Medicare HMO | Admitting: Urology

## 2020-05-30 VITALS — BP 168/84 | HR 80 | Ht 66.0 in | Wt 154.0 lb

## 2020-05-30 DIAGNOSIS — C674 Malignant neoplasm of posterior wall of bladder: Secondary | ICD-10-CM

## 2020-05-30 DIAGNOSIS — R339 Retention of urine, unspecified: Secondary | ICD-10-CM

## 2020-05-30 MED ORDER — CEPHALEXIN 250 MG PO CAPS
500.0000 mg | ORAL_CAPSULE | Freq: Once | ORAL | Status: AC
  Administered 2020-05-30: 500 mg via ORAL

## 2020-05-30 NOTE — Progress Notes (Unsigned)
05/30/2020 4:05 PM   CHARES SLAYMAKER 12/03/1945 [id number]  Referring provider: [facility], [facility] [place],  [place] [address]  Chief Complaint  Patient presents with  . Bladder Cancer    HPI: 75 year old male with newly diagnosed bladder cancer who returns today accompanied by his daughter as well as Patent attorney via Associate Professor.  He initially presented with intermittent gross hematuria.  He underwent CT urogram which was highly suspicious for malignancy within the bladder but no evidence of lymphadenopathy or metastatic disease.  He is a to the operating room for TURBT on 05/22/2018 at which time several overlapping posterior bladder wall tumors were identified, at least 2.5 cm as well as a more worrisome irregular nodular tumor in the left lateral bladder wall which was highly suspicious for muscle invasive cancer.  Postoperatively, he developed urinary retention on postop day 1 and Foley catheter was placed.  He returns today for voiding trial which he was able to urinate spontaneously after his bladder was filled and the catheter was removed.  He was started on Flomax last week.  Surgical pathology consistent with at least high-grade T1.  There is areas where there is cancer abutting muscularis propria as well as representation of the muscle within the specimen highly suspicious for muscular involvement although no direct invasion was identified.  Mr. Toman has multiple medical comorbidities including personal history of COPD on O2.   PMH: Past Medical History:  Diagnosis Date  . Arthritis   . Asthma   . Cancer ([place])   . COPD (chronic obstructive pulmonary disease) ([place])   . Dyspnea   . GERD (gastroesophageal reflux disease)   . Hypertension     Surgical History: Past Surgical History:  Procedure Laterality Date  . COLONOSCOPY    . HERNIA REPAIR  2017  . TRANSURETHRAL RESECTION OF BLADDER TUMOR WITH MITOMYCIN-C N/A  05/22/2020   Procedure: TRANSURETHRAL RESECTION OF BLADDER TUMOR WITH Gemcitabine;  Surgeon: Hollice Espy, MD;  Location: [facility] ORS;  Service: Urology;  Laterality: N/A;    Home Medications:  Allergies as of 05/30/2020      Reactions   Shrimp [shellfish Allergy] Other (See Comments)   Per MD comments critical      Medication List       Accurate as of May 30, 2020 11:59 PM. If you have any questions, ask your nurse or doctor.        STOP taking these medications   HYDROcodone-acetaminophen 5-325 MG tablet Commonly known as: NORCO/VICODIN Stopped by: Hollice Espy, MD   oxybutynin 5 MG tablet Commonly known as: DITROPAN Stopped by: Hollice Espy, MD     TAKE these medications   acetaminophen 500 MG tablet Commonly known as: TYLENOL Take 500 mg by mouth every 8 (eight) hours as needed for moderate pain.   albuterol 108 (90 Base) MCG/ACT inhaler Commonly known as: VENTOLIN HFA Inhale 2 puffs into the lungs every 4 (four) hours as needed for wheezing or shortness of breath.   albuterol (2.5 MG/3ML) 0.083% nebulizer solution Commonly known as: PROVENTIL Take 2.5 mg by nebulization every 4 (four) hours as needed for wheezing or shortness of breath.   amLODipine 10 MG tablet Commonly known as: NORVASC Take 10 mg by mouth daily.   arformoterol 15 MCG/2ML Nebu Commonly known as: BROVANA Take 15 mcg by nebulization 2 (two) times daily.   cetirizine 10 MG tablet Commonly known as: ZYRTEC Chew 10 mg by mouth daily.   diclofenac  Sodium 1 % Gel Commonly known as: VOLTAREN Apply 1 application topically 2 (two) times daily as needed (pain).   hydrochlorothiazide 25 MG tablet Commonly known as: HYDRODIURIL Take 25 mg by mouth daily.   montelukast 10 MG tablet Commonly known as: SINGULAIR Take 10 mg by mouth at bedtime.   Nucala 100 MG Solr Generic drug: Mepolizumab Inject 100 mg into the skin every 30 (thirty) days.   pantoprazole 40 MG tablet Commonly known  as: PROTONIX Take 40 mg by mouth daily.   roflumilast 500 MCG Tabs tablet Commonly known as: DALIRESP Take 500 mcg by mouth daily.   simvastatin 20 MG tablet Commonly known as: ZOCOR Take 20 mg by mouth daily.   tamsulosin 0.4 MG Caps capsule Commonly known as: FLOMAX Take 1 capsule (0.4 mg total) by mouth daily. What changed: Another medication with the same name was removed. Continue taking this medication, and follow the directions you see here. Changed by: Hollice Espy, MD   tiotropium 18 MCG inhalation capsule Commonly known as: SPIRIVA Place 18 mcg into inhaler and inhale daily.   traMADol 50 MG tablet Commonly known as: ULTRAM Take 50 mg by mouth every 6 (six) hours as needed for severe pain.       Allergies:  Allergies  Allergen Reactions  . Shrimp [Shellfish Allergy] Other (See Comments)    Per MD comments critical    Family History: History reviewed. No pertinent family history.  Social History:  reports that he quit smoking about 12 years ago. His smoking use included cigarettes. He has a 60.00 pack-year smoking history. He has never used smokeless tobacco. He reports previous alcohol use. He reports that he does not use drugs.   Physical Exam: BP (!) 168/84   Pulse 80   Ht 5\' 6"  (1.676 m)   Wt 154 lb (69.9 kg)   BMI 24.86 kg/m   Constitutional:  Alert and oriented, No acute distress. HEENT: [place] AT, moist mucus membranes.  Trachea midline, no masses. Cardiovascular: No clubbing, cyanosis, or edema. Respiratory: Normal respiratory effort, no increased work of breathing. GU: Normal phallus with Foley catheter in place prior to voiding trial with clear yellow urine.  He did receive antibiotics at the time of catheter removal. Skin: No rashes, bruises or suspicious lesions. Neurologic: Grossly intact, no focal deficits, moving all 4 extremities. Psychiatric: Normal mood and affect.  Laboratory Data:  Lab Results  Component Value Date   CREATININE  0.97 04/25/2020     Assessment & Plan:    1. Malignant neoplasm of posterior wall/lateral of urinary bladder [facility]) Surgical pathology was reviewed.  Plan also present this at tumor board.  Based on clinical suspicion both clinically the time of TURBT and pathologically, highly concerning for muscle invasive bladder cancer.  We discussed the role of repeat TUR.  He like to avoid return to the operating room at this time if at all possible given that he is fairly high risk on O2 with fairly significant COPD.  We discussed management options for presumed muscle invasive bladder cancer.  He is adamantly against cystoprostatectomy and he is a fairly poor candidate for this given his functional status and medical comorbidities.  We discussed the role of chemo with radiation.  He is now status post debulking.  Plan to present him at tumor board later this week as well as facilitate referral to radiation oncology and medical oncology for possible platinum based chemotherapy with radiation.  His creatinine is normal.  He is started  agreeable this plan.  We will tentatively plan for cystoscopy in about 6 months to reassess his bladder.  All questions answered.  2. Urinary retention Successful voiding trial today  Urinary retention in the setting of BPH, anesthesia, and recent surgical intervention.  Advised to return later today if he is unable to urinate adequately.  Continue Flomax for the time being. - cephALEXin (KEFLEX) capsule 500 mg   Hollice Espy, MD  [facility] [address], [place], [place] [address] [phone]  I spent 40 total minutes on the day of the encounter including pre-visit review of the medical record, face-to-face time with the patient, and post visit ordering of labs/imaging/tests.

## 2020-05-30 NOTE — Progress Notes (Signed)
Fill and Pull Catheter Removal  Patient is present today for a catheter removal.  Patient was cleaned and prepped in a sterile fashion 98ml of sterile water/ saline was instilled into the bladder when the patient felt the urge to urinate. 143ml of water was then drained from the balloon.  A 22FR foley cath was removed from the bladder no complications were noted did have a bladder spasm. Patient as then given some time to void on their own.  Patient can void  14ml on their own after some time.  Patient tolerated well.  Performed by: Verlene Mayer, CMA and Lesli Albee

## 2020-06-01 ENCOUNTER — Other Ambulatory Visit: Payer: Medicare HMO

## 2020-06-01 DIAGNOSIS — C678 Malignant neoplasm of overlapping sites of bladder: Secondary | ICD-10-CM

## 2020-06-03 ENCOUNTER — Encounter: Payer: Self-pay | Admitting: Urology

## 2020-06-08 ENCOUNTER — Encounter: Payer: Self-pay | Admitting: Radiation Oncology

## 2020-06-08 ENCOUNTER — Ambulatory Visit
Admission: RE | Admit: 2020-06-08 | Discharge: 2020-06-08 | Disposition: A | Discharge status: Home / Self Care | Payer: Medicare HMO | Source: Ambulatory Visit | Attending: Radiation Oncology | Admitting: Radiation Oncology

## 2020-06-08 VITALS — BP 162/85 | HR 102 | Temp 96.8°F | Wt 152.0 lb

## 2020-06-08 DIAGNOSIS — K219 Gastro-esophageal reflux disease without esophagitis: Secondary | ICD-10-CM | POA: Diagnosis not present

## 2020-06-08 DIAGNOSIS — Z87891 Personal history of nicotine dependence: Secondary | ICD-10-CM | POA: Diagnosis not present

## 2020-06-08 DIAGNOSIS — C678 Malignant neoplasm of overlapping sites of bladder: Secondary | ICD-10-CM | POA: Diagnosis present

## 2020-06-08 DIAGNOSIS — M129 Arthropathy, unspecified: Secondary | ICD-10-CM | POA: Insufficient documentation

## 2020-06-08 DIAGNOSIS — I10 Essential (primary) hypertension: Secondary | ICD-10-CM | POA: Diagnosis not present

## 2020-06-08 DIAGNOSIS — Z79899 Other long term (current) drug therapy: Secondary | ICD-10-CM | POA: Insufficient documentation

## 2020-06-08 DIAGNOSIS — J449 Chronic obstructive pulmonary disease, unspecified: Secondary | ICD-10-CM | POA: Diagnosis not present

## 2020-06-08 DIAGNOSIS — R0602 Shortness of breath: Secondary | ICD-10-CM | POA: Diagnosis not present

## 2020-06-08 DIAGNOSIS — J45909 Unspecified asthma, uncomplicated: Secondary | ICD-10-CM | POA: Insufficient documentation

## 2020-06-08 NOTE — Consult Note (Signed)
NEW PATIENT EVALUATION  Name: Joe Torres  MRN: [id number]  Date:   06/08/2020     DOB: 1945-08-18   This 75 y.o. male patient presents to the clinic for initial evaluation of probable muscle invading bladder cancer.  REFERRING PHYSICIAN: Center, Rafael Hernandez:  Chief Complaint  Patient presents with  . Consult    DIAGNOSIS: The encounter diagnosis was Malignant neoplasm of overlapping sites of bladder ([place]).   PREVIOUS INVESTIGATIONS:  CT scans reviewed Pathology report reviewed Clinical notes reviewed Case presented at tumor conference  HPI: Patient is a 75 year old Spanish-speaking male accompanied by interpreter who initially presented with intermittent gross hematuria.  Urogram showed 2 enhancing lesions identified in the bladder base suspicious for urothelial neoplasm.  No finding of nodal metastasis is noted prostate gland had enlargement with mass-effect upon the base of the bladder.  He was taken for TURBT on January 10 showing overlapping left posterior bladder wall tumors at least 2.5 cm and a more worrisome irregular nodular tumor in the left lateral base which was highly suspicious for muscle invasive cancer.  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  Patient is voiding well now.  His case was presented at weekly tumor conference.  Patient is against cystoprostatectomy and patient has also declined based on his comorbidities including need for nasal oxygen to have repeat TURBT for definitive muscle invasion.  Recommendation at tumor conference was for possible chemoradiation and he is seen today for radiation oncology opinion.  He is having no further hematuria.  He is voiding well at this time.  PLANNED TREATMENT REGIMEN: Possible concurrent chemoradiation  PAST MEDICAL HISTORY:  has a past medical history of Arthritis, Asthma, Cancer ([place]), COPD (chronic obstructive  pulmonary disease) ([place]), Dyspnea, GERD (gastroesophageal reflux disease), and Hypertension.    PAST SURGICAL HISTORY:  Past Surgical History:  Procedure Laterality Date  . COLONOSCOPY    . HERNIA REPAIR  2017  . TRANSURETHRAL RESECTION OF BLADDER TUMOR WITH MITOMYCIN-C N/A 05/22/2020   Procedure: TRANSURETHRAL RESECTION OF BLADDER TUMOR WITH Gemcitabine;  Surgeon: Hollice Espy, MD;  Location: [facility] ORS;  Service: Urology;  Laterality: N/A;    FAMILY HISTORY: family history is not on file.  SOCIAL HISTORY:  reports that he quit smoking about 12 years ago. His smoking use included cigarettes. He has a 60.00 pack-year smoking history. He has never used smokeless tobacco. He reports previous alcohol use. He reports that he does not use drugs.  ALLERGIES: Shrimp [shellfish allergy]  MEDICATIONS:  Current Outpatient Medications  Medication Sig Dispense Refill  . acetaminophen (TYLENOL) 500 MG tablet Take 500 mg by mouth every 8 (eight) hours as needed for moderate pain.    Marland Kitchen albuterol (PROVENTIL HFA;VENTOLIN HFA) 108 (90 Base) MCG/ACT inhaler Inhale 2 puffs into the lungs every 4 (four) hours as needed for wheezing or shortness of breath.    Marland Kitchen albuterol (PROVENTIL) (2.5 MG/3ML) 0.083% nebulizer solution Take 2.5 mg by nebulization every 4 (four) hours as needed for wheezing or shortness of breath.    Marland Kitchen amLODipine (NORVASC) 10 MG tablet Take 10 mg by mouth daily.    Marland Kitchen arformoterol (BROVANA) 15 MCG/2ML NEBU Take 15 mcg by nebulization 2 (two) times daily.    . cetirizine (ZYRTEC) 10 MG tablet Chew 10 mg by mouth daily.    . diclofenac Sodium (VOLTAREN) 1 % GEL Apply 1 application topically 2 (two) times daily as needed (pain).    Marland Kitchen  hydrochlorothiazide (HYDRODIURIL) 25 MG tablet Take 25 mg by mouth daily.    . Mepolizumab (NUCALA) 100 MG SOLR Inject 100 mg into the skin every 30 (thirty) days.    . montelukast (SINGULAIR) 10 MG tablet Take 10 mg by mouth at bedtime.    . pantoprazole  (PROTONIX) 40 MG tablet Take 40 mg by mouth daily.    . roflumilast (DALIRESP) 500 MCG TABS tablet Take 500 mcg by mouth daily.    . simvastatin (ZOCOR) 20 MG tablet Take 20 mg by mouth daily.    . tamsulosin (FLOMAX) 0.4 MG CAPS capsule Take 1 capsule (0.4 mg total) by mouth daily. 30 capsule 0  . tiotropium (SPIRIVA) 18 MCG inhalation capsule Place 18 mcg into inhaler and inhale daily.    . traMADol (ULTRAM) 50 MG tablet Take 50 mg by mouth every 6 (six) hours as needed for severe pain.     Current Facility-Administered Medications  Medication Dose Route Frequency Provider Last Rate Last Admin  . gemcitabine (GEMZAR) chemo syringe for bladder instillation 2,000 mg  2,000 mg Bladder Instillation Once Hollice Espy, MD        ECOG PERFORMANCE STATUS:  0 - Asymptomatic  REVIEW OF SYSTEMS: Patient denies any weight loss, fatigue, weakness, fever, chills or night sweats. Patient denies any loss of vision, blurred vision. Patient denies any ringing  of the ears or hearing loss. No irregular heartbeat. Patient denies heart murmur or history of fainting. Patient denies any chest pain or pain radiating to her upper extremities. Patient denies any shortness of breath, difficulty breathing at night, cough or hemoptysis. Patient denies any swelling in the lower legs. Patient denies any nausea vomiting, vomiting of blood, or coffee ground material in the vomitus. Patient denies any stomach pain. Patient states has had normal bowel movements no significant constipation or diarrhea. Patient denies any dysuria, hematuria or significant nocturia. Patient denies any problems walking, swelling in the joints or loss of balance. Patient denies any skin changes, loss of hair or loss of weight. Patient denies any excessive worrying or anxiety or significant depression. Patient denies any problems with insomnia. Patient denies excessive thirst, polyuria, polydipsia. Patient denies any swollen glands, patient denies easy  bruising or easy bleeding. Patient denies any recent infections, allergies or URI. Patient "s visual fields have not changed significantly in recent time.   PHYSICAL EXAM: BP (!) 162/85   Pulse (!) 102   Temp (!) 96.8 F (36 C) (Tympanic)   Wt 152 lb (68.9 kg)   SpO2 94% Comment: 3 liters o2  BMI 24.53 kg/m  Patient is oxygen dependent well-developed well-nourished patient in NAD. HEENT reveals PERLA, EOMI, discs not visualized.  Oral cavity is clear. No oral mucosal lesions are identified. Neck is clear without evidence of cervical or supraclavicular adenopathy. Lungs are clear to A&P. Cardiac examination is essentially unremarkable with regular rate and rhythm without murmur rub or thrill. Abdomen is benign with no organomegaly or masses noted. Motor sensory and DTR levels are equal and symmetric in the upper and lower extremities. Cranial nerves II through XII are grossly intact. Proprioception is intact. No peripheral adenopathy or edema is identified. No motor or sensory levels are noted. Crude visual fields are within normal range.  LABORATORY DATA: Pathology report reviewed    RADIOLOGY RESULTS: CT scan reviewed compatible with above-stated findings   IMPRESSION: Highly suspicious for muscle invasion [place] urothelial carcinoma of the bladder in 75 year old male  PLAN: At this time I have recommended  pelvic radiation to 45 Gray over 5 weeks would also boost another 20 Pearline Cables his region of the bladder base were tumor was involved by initial CT criteria.  Patient also has an appoint with medical oncology next week for consideration of concurrent chemo.  Risks and benefits of radiation including increased lower urinary tract symptoms diarrhea fatigue alteration of blood count skin reaction all were described in detail to the patient.  Patient comprehends my recommendations well.  I personally set up and ordered CT simulation.  There will be extra effort by both professional staff as well as  technical staff to coordinate and manage concurrent chemoradiation and ensuing side effects during his treatments.   I would like to take this opportunity to thank you for allowing me to participate in the care of your patient.Noreene Filbert, MD

## 2020-06-12 ENCOUNTER — Inpatient Hospital Stay: Payer: Medicare HMO

## 2020-06-12 ENCOUNTER — Inpatient Hospital Stay: Payer: Medicare HMO | Admitting: Oncology

## 2020-06-14 ENCOUNTER — Ambulatory Visit
Admission: RE | Admit: 2020-06-14 | Discharge: 2020-06-14 | Disposition: A | Discharge status: Home / Self Care | Payer: Medicare HMO | Source: Ambulatory Visit | Attending: Radiation Oncology | Admitting: Radiation Oncology

## 2020-06-14 DIAGNOSIS — C678 Malignant neoplasm of overlapping sites of bladder: Secondary | ICD-10-CM | POA: Insufficient documentation

## 2020-06-14 DIAGNOSIS — Z510 Encounter for antineoplastic radiation therapy: Secondary | ICD-10-CM | POA: Insufficient documentation

## 2020-06-15 ENCOUNTER — Other Ambulatory Visit: Payer: Self-pay | Admitting: *Deleted

## 2020-06-15 ENCOUNTER — Inpatient Hospital Stay: Payer: Medicare HMO

## 2020-06-15 ENCOUNTER — Encounter: Payer: Self-pay | Admitting: Oncology

## 2020-06-15 ENCOUNTER — Inpatient Hospital Stay: Payer: Medicare HMO | Attending: Oncology | Admitting: Oncology

## 2020-06-15 ENCOUNTER — Telehealth (INDEPENDENT_AMBULATORY_CARE_PROVIDER_SITE_OTHER): Payer: Self-pay

## 2020-06-15 ENCOUNTER — Encounter (INDEPENDENT_AMBULATORY_CARE_PROVIDER_SITE_OTHER): Payer: Self-pay

## 2020-06-15 VITALS — BP 143/78 | HR 91 | Temp 97.0°F | Resp 20 | Wt 153.2 lb

## 2020-06-15 DIAGNOSIS — K219 Gastro-esophageal reflux disease without esophagitis: Secondary | ICD-10-CM | POA: Insufficient documentation

## 2020-06-15 DIAGNOSIS — Z79899 Other long term (current) drug therapy: Secondary | ICD-10-CM | POA: Diagnosis not present

## 2020-06-15 DIAGNOSIS — Z87891 Personal history of nicotine dependence: Secondary | ICD-10-CM | POA: Insufficient documentation

## 2020-06-15 DIAGNOSIS — J449 Chronic obstructive pulmonary disease, unspecified: Secondary | ICD-10-CM | POA: Insufficient documentation

## 2020-06-15 DIAGNOSIS — Z510 Encounter for antineoplastic radiation therapy: Secondary | ICD-10-CM | POA: Diagnosis not present

## 2020-06-15 DIAGNOSIS — I10 Essential (primary) hypertension: Secondary | ICD-10-CM | POA: Diagnosis not present

## 2020-06-15 DIAGNOSIS — Z7189 Other specified counseling: Secondary | ICD-10-CM

## 2020-06-15 DIAGNOSIS — Z923 Personal history of irradiation: Secondary | ICD-10-CM | POA: Diagnosis not present

## 2020-06-15 DIAGNOSIS — C678 Malignant neoplasm of overlapping sites of bladder: Secondary | ICD-10-CM

## 2020-06-15 DIAGNOSIS — Z5111 Encounter for antineoplastic chemotherapy: Secondary | ICD-10-CM | POA: Insufficient documentation

## 2020-06-15 LAB — CBC WITH DIFFERENTIAL/PLATELET
Abs Immature Granulocytes: 0.04 10*3/uL (ref 0.00–0.07)
Basophils Absolute: 0 10*3/uL (ref 0.0–0.1)
Basophils Relative: 0 %
Eosinophils Absolute: 0 10*3/uL (ref 0.0–0.5)
Eosinophils Relative: 0 %
HCT: 41.1 % (ref 39.0–52.0)
Hemoglobin: 13.9 g/dL (ref 13.0–17.0)
Immature Granulocytes: 1 %
Lymphocytes Relative: 16 %
Lymphs Abs: 1.3 10*3/uL (ref 0.7–4.0)
MCH: 29.8 pg (ref 26.0–34.0)
MCHC: 33.8 g/dL (ref 30.0–36.0)
MCV: 88 fL (ref 80.0–100.0)
Monocytes Absolute: 0.8 10*3/uL (ref 0.1–1.0)
Monocytes Relative: 10 %
Neutro Abs: 6.1 10*3/uL (ref 1.7–7.7)
Neutrophils Relative %: 73 %
Platelets: 497 10*3/uL — ABNORMAL HIGH (ref 150–400)
RBC: 4.67 MIL/uL (ref 4.22–5.81)
RDW: 12.3 % (ref 11.5–15.5)
WBC: 8.3 10*3/uL (ref 4.0–10.5)
nRBC: 0 % (ref 0.0–0.2)

## 2020-06-15 LAB — COMPREHENSIVE METABOLIC PANEL
ALT: 27 U/L (ref 0–44)
AST: 25 U/L (ref 15–41)
Albumin: 4.4 g/dL (ref 3.5–5.0)
Alkaline Phosphatase: 69 U/L (ref 38–126)
Anion gap: 10 (ref 5–15)
BUN: 11 mg/dL (ref 8–23)
CO2: 30 mmol/L (ref 22–32)
Calcium: 9 mg/dL (ref 8.9–10.3)
Chloride: 100 mmol/L (ref 98–111)
Creatinine, Ser: 0.79 mg/dL (ref 0.61–1.24)
GFR, Estimated: >60 mL/min (ref >60)
Glucose, Bld: 106 mg/dL — ABNORMAL HIGH (ref 70–99)
Potassium: 3.9 mmol/L (ref 3.5–5.1)
Sodium: 140 mmol/L (ref 135–145)
Total Bilirubin: 0.4 mg/dL (ref 0.3–1.2)
Total Protein: 7.7 g/dL (ref 6.5–8.1)

## 2020-06-15 NOTE — Telephone Encounter (Signed)
Patient has been schedule for port placement on 06/19/20 with Dr Lucky Cowboy with arrival time 12 pm and Covid-19 testing is schedule for 06/16/20 between 8-1 pm at the [facility]. I have went over pre-instructions over with patient daughter and she will coming by the office to pick the pre-instructions.

## 2020-06-16 ENCOUNTER — Other Ambulatory Visit: Payer: Self-pay

## 2020-06-16 ENCOUNTER — Other Ambulatory Visit
Admission: RE | Admit: 2020-06-16 | Discharge: 2020-06-16 | Disposition: A | Discharge status: Home / Self Care | Payer: Medicare HMO | Source: Ambulatory Visit | Attending: Vascular Surgery | Admitting: Vascular Surgery

## 2020-06-16 ENCOUNTER — Encounter: Payer: Self-pay | Admitting: Oncology

## 2020-06-16 DIAGNOSIS — Z01812 Encounter for preprocedural laboratory examination: Secondary | ICD-10-CM | POA: Insufficient documentation

## 2020-06-16 DIAGNOSIS — Z20822 Contact with and (suspected) exposure to covid-19: Secondary | ICD-10-CM | POA: Diagnosis not present

## 2020-06-16 LAB — SARS CORONAVIRUS 2 (TAT 6-24 HRS): SARS Coronavirus 2: NEGATIVE

## 2020-06-18 DIAGNOSIS — Z7189 Other specified counseling: Secondary | ICD-10-CM | POA: Insufficient documentation

## 2020-06-18 DIAGNOSIS — C678 Malignant neoplasm of overlapping sites of bladder: Secondary | ICD-10-CM | POA: Insufficient documentation

## 2020-06-18 MED ORDER — PROCHLORPERAZINE MALEATE 10 MG PO TABS: 10.0000 mg | ORAL_TABLET | Freq: Four times a day (QID) | ORAL | 1 refills | Status: DC | PRN

## 2020-06-18 MED ORDER — LIDOCAINE-PRILOCAINE 2.5-2.5 % EX CREA: TOPICAL_CREAM | CUTANEOUS | 3 refills | Status: DC

## 2020-06-18 MED ORDER — LORAZEPAM 0.5 MG PO TABS: 0.5000 mg | ORAL_TABLET | Freq: Four times a day (QID) | ORAL | 0 refills | Status: DC | PRN

## 2020-06-18 MED ORDER — ONDANSETRON HCL 8 MG PO TABS: 8.0000 mg | ORAL_TABLET | Freq: Two times a day (BID) | ORAL | 1 refills | Status: DC | PRN

## 2020-06-18 NOTE — Progress Notes (Signed)
START ON PATHWAY REGIMEN - Bladder     One cycle, concurrent with RT:     Fluorouracil      Mitomycin   **Always confirm dose/schedule in your pharmacy ordering system**  Patient Characteristics: Pre-Cystectomy or Nonsurgical Candidate (Clinical Staging), cT2-4a, cN0-1, M0, Cystectomy Ineligible Therapeutic Status: Pre-Cystectomy or Nonsurgical Candidate (Clinical Staging) AJCC M Category: cM0 AJCC 8 Stage Grouping: II AJCC T Category: cT2 AJCC N Category: cN0 Intent of Therapy: Curative Intent, Discussed with Patient

## 2020-06-18 NOTE — Progress Notes (Signed)
Hematology/Oncology Consult note [facility] [phone] [phone]  Patient Care Team: Center, [facility] as PCP - General (General Practice)   Name of the patient: Joe Torres  [id number]  03-26-1946    Reason for referral-new diagnosis of bladder cancer   Referring physician-Dr. Hollice Espy  Date of visit: 06/18/20   History of presenting illness-patient is a 75 year old Hispanic male.  History obtained with the help of Spanish interpreter.  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  He presented with symptoms of gross hematuria and underwent CT urogram which showed 2 lesions in the bladder 1 measuring 2.1 x 2.1 cm in the posterior bladder wall as well as an eccentric mass just left of the midline measuring 1.6 x 1.1 cm.  Patient was seen by urology and underwent cystoscopy.  Visualization was limited due to gross hematuria.  Large at least 2 cm spherical high-grade appearing tumor in the midline posterior bladder wall.  This was followed by TURBT which showed a 2.5 cm posterior bladder tumor appearing necrotic/hemorrhagic another 2 cm nodular area which had a submucosal solid infiltrative appearance.  Significant bladder varices and hypervascularity surrounding tumors.  Pathology showed invasive urothelial carcinoma high-grade with high suspicion for muscularis propria involvement.  Scattered small fragments of muscularis propria identified an invasive tumor was seen adjacent to these muscle bundles.  However unequivocal muscle invasion was not readily apparent.  Patient could not urinate following his TURBT and required temporary placement of Foley catheter but was able to pass his trial of void later.  Risk of repeat TURBT was discussed and mutual consensus was to avoid another invasive procedure given his oxygen dependent COPD.  Case discussed at tumor board.  He was not deemed to be a good candidate for  cystoprostatectomy.  Based on the appearance of the tumor at the time of TURBT as well as pathology, it was deemed that there was a high risk of muscle invasion.  Patient referred to medical oncology and radiation oncology for further management  At baseline patient lives with his wife.  He has oxygen dependent COPD but is independent of his ADLs.  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  He has not had any recent falls or hospitalizations.  Presently patient denies any hematuria  ECOG PS- 1  Pain scale- 0   Review of systems- Review of Systems  Constitutional: Positive for malaise/fatigue. Negative for chills, fever and weight loss.  HENT: Negative for congestion, ear discharge and nosebleeds.   Eyes: Negative for blurred vision.  Respiratory: Positive for shortness of breath. Negative for cough, hemoptysis, sputum production and wheezing.   Cardiovascular: Negative for chest pain, palpitations, orthopnea and claudication.  Gastrointestinal: Negative for abdominal pain, blood in stool, constipation, diarrhea, heartburn, melena, nausea and vomiting.  Genitourinary: Negative for dysuria, flank pain, frequency, hematuria and urgency.  Musculoskeletal: Negative for back pain, joint pain and myalgias.  Skin: Negative for rash.  Neurological: Negative for dizziness, tingling, focal weakness, seizures, weakness and headaches.  Endo/Heme/Allergies: Does not bruise/bleed easily.  Psychiatric/Behavioral: Negative for depression and suicidal ideas. The patient does not have insomnia.     Allergies  Allergen Reactions  . Shrimp [Shellfish Allergy] Other (See Comments)    Per MD comments critical    Patient Active Problem List   Diagnosis Date Noted  . Malignant neoplasm of overlapping sites of bladder ([place]) 06/18/2020  . Goals of care, counseling/discussion 06/18/2020  .  Hyperlipidemia 05/27/2019  . Hypertension 05/27/2019  . OSA on CPAP 05/27/2019  . Primary osteoarthritis  of right knee 06/19/2018  . Cataract 07/17/2015  . Injury of conjunctiva and corneal abrasion of left eye without foreign body 06/28/2015  . Hordeolum externum 03/30/2015  . Right inguinal hernia 03/20/2015  . Environmental allergies 03/17/2015  . Skin lesion 11/02/2014  . Lumbar strain, initial encounter 06/17/2014  . 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 Impaired glucose tolerance 05/09/2014  . Knee pain 05/09/2014  . Carpal tunnel syndrome 03/28/2014  . BPH (benign prostatic hyperplasia) 05/10/2013  . GERD (gastroesophageal reflux disease) 05/10/2013  . Chronic pain syndrome 03/10/2013  . 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 Pulmonary nodule seen on imaging study 01/29/2013  . Cortical senile cataract 12/04/2011  . Tear film insufficiency 12/04/2011  . Obstructive chronic bronchitis ([place]) 07/31/2010  . Onychomycosis 03/28/2009  . Encounter for general adult medical examination without abnormal findings 04/27/2008  . Sciatica 12/30/2007  . Back pain 12/16/2007     Past Medical History:  Diagnosis Date  . Arthritis   . Asthma   . Bladder cancer ([place])   . Cancer ([place])   . COPD (chronic obstructive pulmonary disease) ([place])   . Dyspnea   . GERD (gastroesophageal reflux disease)   . Hypertension      Past Surgical History:  Procedure Laterality Date  . COLONOSCOPY    . HERNIA REPAIR  2017  . TRANSURETHRAL RESECTION OF BLADDER TUMOR WITH MITOMYCIN-C N/A 05/22/2020   Procedure: TRANSURETHRAL RESECTION OF BLADDER TUMOR WITH Gemcitabine;  Surgeon: Hollice Espy, MD;  Location: [facility] ORS;  Service: Urology;  Laterality: N/A;    Social History   Socioeconomic History  . Marital status: Married    Spouse name: Not on file  . Number of children: Not on file  . Years of education: Not on file  . Highest education level: Not on file  Occupational History  . Not on file  Tobacco Use  . Smoking status: Former Smoker     Packs/day: 1.50    Years: 40.00    Pack years: 60.00    Types: Cigarettes    Quit date: 05/21/2008    Years since quitting: 12.0  . Smokeless tobacco: Never Used  Vaping Use  . Vaping Use: Never used  Substance and Sexual Activity  . Alcohol use: Not Currently  . Drug use: Never  . Sexual activity: Not on file  Other Topics Concern  . Not on file  Social History Narrative  . Not on file   Social Determinants of Health   Financial Resource Strain: Not on file  Food Insecurity: Not on file  Transportation Needs: Not on file  Physical Activity: Not on file  Stress: Not on file  Social Connections: Not on file  Intimate Partner Violence: Not on file     No family history on file.   Current Outpatient Medications:  .  acetaminophen (TYLENOL) 500 MG tablet, Take 500 mg by mouth every 8 (eight) hours as needed for moderate pain., Disp: , Rfl:  .  albuterol (PROVENTIL HFA;VENTOLIN HFA) 108 (90 Base) MCG/ACT inhaler, Inhale 2 puffs into the lungs every 4 (four) hours as needed for wheezing or shortness of breath., Disp: , Rfl:  .  albuterol (PROVENTIL) (2.5 MG/3ML) 0.083% nebulizer solution, Take 2.5 mg by nebulization every 4 (four) hours as needed for wheezing or shortness of breath., Disp: , Rfl:  .  amLODipine (NORVASC) 10 MG  tablet, Take 10 mg by mouth daily., Disp: , Rfl:  .  arformoterol (BROVANA) 15 MCG/2ML NEBU, Take 15 mcg by nebulization 2 (two) times daily., Disp: , Rfl:  .  cetirizine (ZYRTEC) 10 MG tablet, Chew 10 mg by mouth daily., Disp: , Rfl:  .  diclofenac Sodium (VOLTAREN) 1 % GEL, Apply 1 application topically 2 (two) times daily as needed (pain)., Disp: , Rfl:  .  hydrochlorothiazide (HYDRODIURIL) 25 MG tablet, Take 25 mg by mouth daily., Disp: , Rfl:  .  Mepolizumab (NUCALA) 100 MG SOLR, Inject 100 mg into the skin every 30 (thirty) days., Disp: , Rfl:  .  montelukast (SINGULAIR) 10 MG tablet, Take 10 mg by mouth at bedtime., Disp: , Rfl:  .  pantoprazole  (PROTONIX) 40 MG tablet, Take 40 mg by mouth daily., Disp: , Rfl:  .  roflumilast (DALIRESP) 500 MCG TABS tablet, Take 500 mcg by mouth daily., Disp: , Rfl:  .  simvastatin (ZOCOR) 20 MG tablet, Take 20 mg by mouth daily., Disp: , Rfl:  .  tamsulosin (FLOMAX) 0.4 MG CAPS capsule, Take 1 capsule (0.4 mg total) by mouth daily., Disp: 30 capsule, Rfl: 0 .  tiotropium (SPIRIVA) 18 MCG inhalation capsule, Place 18 mcg into inhaler and inhale daily., Disp: , Rfl:  .  traMADol (ULTRAM) 50 MG tablet, Take 50 mg by mouth every 6 (six) hours as needed for severe pain., Disp: , Rfl:  .  lidocaine-prilocaine (EMLA) cream, Apply to affected area once, Disp: 30 g, Rfl: 3 .  LORazepam (ATIVAN) 0.5 MG tablet, Take 1 tablet (0.5 mg total) by mouth every 6 (six) hours as needed (Nausea or vomiting)., Disp: 30 tablet, Rfl: 0 .  ondansetron (ZOFRAN) 8 MG tablet, Take 1 tablet (8 mg total) by mouth 2 (two) times daily as needed (Nausea or vomiting)., Disp: 30 tablet, Rfl: 1 .  prochlorperazine (COMPAZINE) 10 MG tablet, Take 1 tablet (10 mg total) by mouth every 6 (six) hours as needed (Nausea or vomiting)., Disp: 30 tablet, Rfl: 1  Current Facility-Administered Medications:  .  gemcitabine (GEMZAR) chemo syringe for bladder instillation 2,000 mg, 2,000 mg, Bladder Instillation, Once, Hollice Espy, MD   Physical exam:  Vitals:   06/15/20 1125  BP: (!) 143/78  Pulse: 91  Resp: 20  Temp: (!) 97 F (36.1 C)  TempSrc: Tympanic  SpO2: 98%  Weight: 153 lb 3.2 oz (69.5 kg)   Physical Exam Constitutional:      General: He is not in acute distress.    Comments: On home oxygen  Eyes:     Extraocular Movements: EOM normal.     Pupils: Pupils are equal, round, and reactive to light.  Cardiovascular:     Rate and Rhythm: Normal rate and regular rhythm.     Heart sounds: Normal heart sounds.  Pulmonary:     Effort: Pulmonary effort is normal.     Breath sounds: Normal breath sounds.  Abdominal:     General:  Bowel sounds are normal.     Palpations: Abdomen is soft.  Skin:    General: Skin is warm and dry.  Neurological:     Mental Status: He is alert and oriented to person, place, and time.        CMP Latest Ref Rng & Units 06/15/2020  Glucose 70 - 99 mg/dL 106(H)  BUN 8 - 23 mg/dL 11  Creatinine 0.61 - 1.24 mg/dL 0.79  Sodium 135 - 145 mmol/L 140  Potassium 3.5 - 5.1  mmol/L 3.9  Chloride 98 - 111 mmol/L 100  CO2 22 - 32 mmol/L 30  Calcium 8.9 - 10.3 mg/dL 9.0  Total Protein 6.5 - 8.1 g/dL 7.7  Total Bilirubin 0.3 - 1.2 mg/dL 0.4  Alkaline Phos 38 - 126 U/L 69  AST 15 - 41 U/L 25  ALT 0 - 44 U/L 27   CBC Latest Ref Rng & Units 06/15/2020  WBC 4.0 - 10.5 K/uL 8.3  Hemoglobin 13.0 - 17.0 g/dL 13.9  Hematocrit 39.0 - 52.0 % 41.1  Platelets 150 - 400 K/uL 497(H)      DG OR UROLOGY CYSTO IMAGE ([facility] ONLY)  Result Date: 05/22/2020 There is no interpretation for this exam.  This order is for images obtained during a surgical procedure.  Please See "Surgeries" Tab for more information regarding the procedure.    Assessment and plan- Patient is a 75 y.o. 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  CT hematuria work-up on 04/25/2020 did not reveal any evidence of distant metastatic disease.  Pathology shows high-grade urothelial carcinoma and based on cross appearance and pathology it was highly suspicious for muscle invasive disease although not conclusively proven.  Patient did not wish to go through a repeat TURBT.  As such the consensus at tumor board was to proceed as if he was to have muscle invasive disease.    At baseline patient has oxygen dependent COPD as well as some neuropathy in his right hand.  His renal functions are normal and although cisplatin could be considered, I am concerned about his comorbidities as well  as lack of definitive evidence of muscle invasive disease to put him through possible toxicities of cisplatin.  I would therefore recommend a nonplatinum based regimen for him with a combination of 5-FU mitomycin concurrent with radiation.  5-FU would be given from day 1 to day 5 is a continuous infusion and day 16 today 20 concurrent with radiation at 500 mg per metered square per day.  Mitomycin will be given at 12 mg per metered square on day 1.  Discussed risks and benefits of chemotherapy including all but not limited to nausea, vomiting, low blood counts, risk of infections and hospitalizations.  Patient will need a port placement for chemotherapy as well as chemo teach.  Treatment will be given with a curative intent  I will tentatively see him back in 2 weeks time for the start of 5-FU chemotherapy    Thank you for this kind referral and the opportunity to participate in the care of this patient   Visit Diagnosis 1. Malignant neoplasm of overlapping sites of bladder ([place])   2. Goals of care, counseling/discussion     Dr. Randa Evens, MD, MPH [facility] [phone] 06/18/2020 9:50 AM

## 2020-06-18 NOTE — H&P (View-Only) (Signed)
Hematology/Oncology Consult note [facility] [phone] [phone]  Patient Care Team: Center, [facility] as PCP - General (General Practice)   Name of the patient: Joe Torres  [id number]  03-26-1946    Reason for referral-new diagnosis of bladder cancer   Referring physician-Dr. Hollice Espy  Date of visit: 06/18/20   History of presenting illness-patient is a 75 year old Hispanic male.  History obtained with the help of Spanish interpreter.  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  He presented with symptoms of gross hematuria and underwent CT urogram which showed 2 lesions in the bladder 1 measuring 2.1 x 2.1 cm in the posterior bladder wall as well as an eccentric mass just left of the midline measuring 1.6 x 1.1 cm.  Patient was seen by urology and underwent cystoscopy.  Visualization was limited due to gross hematuria.  Large at least 2 cm spherical high-grade appearing tumor in the midline posterior bladder wall.  This was followed by TURBT which showed a 2.5 cm posterior bladder tumor appearing necrotic/hemorrhagic another 2 cm nodular area which had a submucosal solid infiltrative appearance.  Significant bladder varices and hypervascularity surrounding tumors.  Pathology showed invasive urothelial carcinoma high-grade with high suspicion for muscularis propria involvement.  Scattered small fragments of muscularis propria identified an invasive tumor was seen adjacent to these muscle bundles.  However unequivocal muscle invasion was not readily apparent.  Patient could not urinate following his TURBT and required temporary placement of Foley catheter but was able to pass his trial of void later.  Risk of repeat TURBT was discussed and mutual consensus was to avoid another invasive procedure given his oxygen dependent COPD.  Case discussed at tumor board.  He was not deemed to be a good candidate for  cystoprostatectomy.  Based on the appearance of the tumor at the time of TURBT as well as pathology, it was deemed that there was a high risk of muscle invasion.  Patient referred to medical oncology and radiation oncology for further management  At baseline patient lives with his wife.  He has oxygen dependent COPD but is independent of his ADLs.  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  He has not had any recent falls or hospitalizations.  Presently patient denies any hematuria  ECOG PS- 1  Pain scale- 0   Review of systems- Review of Systems  Constitutional: Positive for malaise/fatigue. Negative for chills, fever and weight loss.  HENT: Negative for congestion, ear discharge and nosebleeds.   Eyes: Negative for blurred vision.  Respiratory: Positive for shortness of breath. Negative for cough, hemoptysis, sputum production and wheezing.   Cardiovascular: Negative for chest pain, palpitations, orthopnea and claudication.  Gastrointestinal: Negative for abdominal pain, blood in stool, constipation, diarrhea, heartburn, melena, nausea and vomiting.  Genitourinary: Negative for dysuria, flank pain, frequency, hematuria and urgency.  Musculoskeletal: Negative for back pain, joint pain and myalgias.  Skin: Negative for rash.  Neurological: Negative for dizziness, tingling, focal weakness, seizures, weakness and headaches.  Endo/Heme/Allergies: Does not bruise/bleed easily.  Psychiatric/Behavioral: Negative for depression and suicidal ideas. The patient does not have insomnia.     Allergies  Allergen Reactions  . Shrimp [Shellfish Allergy] Other (See Comments)    Per MD comments critical    Patient Active Problem List   Diagnosis Date Noted  . Malignant neoplasm of overlapping sites of bladder ([place]) 06/18/2020  . Goals of care, counseling/discussion 06/18/2020  .  Hyperlipidemia 05/27/2019  . Hypertension 05/27/2019  . OSA on CPAP 05/27/2019  . Primary osteoarthritis  of right knee 06/19/2018  . Cataract 07/17/2015  . Injury of conjunctiva and corneal abrasion of left eye without foreign body 06/28/2015  . Hordeolum externum 03/30/2015  . Right inguinal hernia 03/20/2015  . Environmental allergies 03/17/2015  . Skin lesion 11/02/2014  . Lumbar strain, initial encounter 06/17/2014  . 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 Impaired glucose tolerance 05/09/2014  . Knee pain 05/09/2014  . Carpal tunnel syndrome 03/28/2014  . BPH (benign prostatic hyperplasia) 05/10/2013  . GERD (gastroesophageal reflux disease) 05/10/2013  . Chronic pain syndrome 03/10/2013  . 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 Pulmonary nodule seen on imaging study 01/29/2013  . Cortical senile cataract 12/04/2011  . Tear film insufficiency 12/04/2011  . Obstructive chronic bronchitis ([place]) 07/31/2010  . Onychomycosis 03/28/2009  . Encounter for general adult medical examination without abnormal findings 04/27/2008  . Sciatica 12/30/2007  . Back pain 12/16/2007     Past Medical History:  Diagnosis Date  . Arthritis   . Asthma   . Bladder cancer ([place])   . Cancer ([place])   . COPD (chronic obstructive pulmonary disease) ([place])   . Dyspnea   . GERD (gastroesophageal reflux disease)   . Hypertension      Past Surgical History:  Procedure Laterality Date  . COLONOSCOPY    . HERNIA REPAIR  2017  . TRANSURETHRAL RESECTION OF BLADDER TUMOR WITH MITOMYCIN-C N/A 05/22/2020   Procedure: TRANSURETHRAL RESECTION OF BLADDER TUMOR WITH Gemcitabine;  Surgeon: Hollice Espy, MD;  Location: [facility] ORS;  Service: Urology;  Laterality: N/A;    Social History   Socioeconomic History  . Marital status: Married    Spouse name: Not on file  . Number of children: Not on file  . Years of education: Not on file  . Highest education level: Not on file  Occupational History  . Not on file  Tobacco Use  . Smoking status: Former Smoker     Packs/day: 1.50    Years: 40.00    Pack years: 60.00    Types: Cigarettes    Quit date: 05/21/2008    Years since quitting: 12.0  . Smokeless tobacco: Never Used  Vaping Use  . Vaping Use: Never used  Substance and Sexual Activity  . Alcohol use: Not Currently  . Drug use: Never  . Sexual activity: Not on file  Other Topics Concern  . Not on file  Social History Narrative  . Not on file   Social Determinants of Health   Financial Resource Strain: Not on file  Food Insecurity: Not on file  Transportation Needs: Not on file  Physical Activity: Not on file  Stress: Not on file  Social Connections: Not on file  Intimate Partner Violence: Not on file     No family history on file.   Current Outpatient Medications:  .  acetaminophen (TYLENOL) 500 MG tablet, Take 500 mg by mouth every 8 (eight) hours as needed for moderate pain., Disp: , Rfl:  .  albuterol (PROVENTIL HFA;VENTOLIN HFA) 108 (90 Base) MCG/ACT inhaler, Inhale 2 puffs into the lungs every 4 (four) hours as needed for wheezing or shortness of breath., Disp: , Rfl:  .  albuterol (PROVENTIL) (2.5 MG/3ML) 0.083% nebulizer solution, Take 2.5 mg by nebulization every 4 (four) hours as needed for wheezing or shortness of breath., Disp: , Rfl:  .  amLODipine (NORVASC) 10 MG  tablet, Take 10 mg by mouth daily., Disp: , Rfl:  .  arformoterol (BROVANA) 15 MCG/2ML NEBU, Take 15 mcg by nebulization 2 (two) times daily., Disp: , Rfl:  .  cetirizine (ZYRTEC) 10 MG tablet, Chew 10 mg by mouth daily., Disp: , Rfl:  .  diclofenac Sodium (VOLTAREN) 1 % GEL, Apply 1 application topically 2 (two) times daily as needed (pain)., Disp: , Rfl:  .  hydrochlorothiazide (HYDRODIURIL) 25 MG tablet, Take 25 mg by mouth daily., Disp: , Rfl:  .  Mepolizumab (NUCALA) 100 MG SOLR, Inject 100 mg into the skin every 30 (thirty) days., Disp: , Rfl:  .  montelukast (SINGULAIR) 10 MG tablet, Take 10 mg by mouth at bedtime., Disp: , Rfl:  .  pantoprazole  (PROTONIX) 40 MG tablet, Take 40 mg by mouth daily., Disp: , Rfl:  .  roflumilast (DALIRESP) 500 MCG TABS tablet, Take 500 mcg by mouth daily., Disp: , Rfl:  .  simvastatin (ZOCOR) 20 MG tablet, Take 20 mg by mouth daily., Disp: , Rfl:  .  tamsulosin (FLOMAX) 0.4 MG CAPS capsule, Take 1 capsule (0.4 mg total) by mouth daily., Disp: 30 capsule, Rfl: 0 .  tiotropium (SPIRIVA) 18 MCG inhalation capsule, Place 18 mcg into inhaler and inhale daily., Disp: , Rfl:  .  traMADol (ULTRAM) 50 MG tablet, Take 50 mg by mouth every 6 (six) hours as needed for severe pain., Disp: , Rfl:  .  lidocaine-prilocaine (EMLA) cream, Apply to affected area once, Disp: 30 g, Rfl: 3 .  LORazepam (ATIVAN) 0.5 MG tablet, Take 1 tablet (0.5 mg total) by mouth every 6 (six) hours as needed (Nausea or vomiting)., Disp: 30 tablet, Rfl: 0 .  ondansetron (ZOFRAN) 8 MG tablet, Take 1 tablet (8 mg total) by mouth 2 (two) times daily as needed (Nausea or vomiting)., Disp: 30 tablet, Rfl: 1 .  prochlorperazine (COMPAZINE) 10 MG tablet, Take 1 tablet (10 mg total) by mouth every 6 (six) hours as needed (Nausea or vomiting)., Disp: 30 tablet, Rfl: 1  Current Facility-Administered Medications:  .  gemcitabine (GEMZAR) chemo syringe for bladder instillation 2,000 mg, 2,000 mg, Bladder Instillation, Once, Hollice Espy, MD   Physical exam:  Vitals:   06/15/20 1125  BP: (!) 143/78  Pulse: 91  Resp: 20  Temp: (!) 97 F (36.1 C)  TempSrc: Tympanic  SpO2: 98%  Weight: 153 lb 3.2 oz (69.5 kg)   Physical Exam Constitutional:      General: He is not in acute distress.    Comments: On home oxygen  Eyes:     Extraocular Movements: EOM normal.     Pupils: Pupils are equal, round, and reactive to light.  Cardiovascular:     Rate and Rhythm: Normal rate and regular rhythm.     Heart sounds: Normal heart sounds.  Pulmonary:     Effort: Pulmonary effort is normal.     Breath sounds: Normal breath sounds.  Abdominal:     General:  Bowel sounds are normal.     Palpations: Abdomen is soft.  Skin:    General: Skin is warm and dry.  Neurological:     Mental Status: He is alert and oriented to person, place, and time.        CMP Latest Ref Rng & Units 06/15/2020  Glucose 70 - 99 mg/dL 106(H)  BUN 8 - 23 mg/dL 11  Creatinine 0.61 - 1.24 mg/dL 0.79  Sodium 135 - 145 mmol/L 140  Potassium 3.5 - 5.1  mmol/L 3.9  Chloride 98 - 111 mmol/L 100  CO2 22 - 32 mmol/L 30  Calcium 8.9 - 10.3 mg/dL 9.0  Total Protein 6.5 - 8.1 g/dL 7.7  Total Bilirubin 0.3 - 1.2 mg/dL 0.4  Alkaline Phos 38 - 126 U/L 69  AST 15 - 41 U/L 25  ALT 0 - 44 U/L 27   CBC Latest Ref Rng & Units 06/15/2020  WBC 4.0 - 10.5 K/uL 8.3  Hemoglobin 13.0 - 17.0 g/dL 13.9  Hematocrit 39.0 - 52.0 % 41.1  Platelets 150 - 400 K/uL 497(H)      DG OR UROLOGY CYSTO IMAGE ([facility] ONLY)  Result Date: 05/22/2020 There is no interpretation for this exam.  This order is for images obtained during a surgical procedure.  Please See "Surgeries" Tab for more information regarding the procedure.    Assessment and plan- Patient is a 75 y.o. 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  CT hematuria work-up on 04/25/2020 did not reveal any evidence of distant metastatic disease.  Pathology shows high-grade urothelial carcinoma and based on cross appearance and pathology it was highly suspicious for muscle invasive disease although not conclusively proven.  Patient did not wish to go through a repeat TURBT.  As such the consensus at tumor board was to proceed as if he was to have muscle invasive disease.    At baseline patient has oxygen dependent COPD as well as some neuropathy in his right hand.  His renal functions are normal and although cisplatin could be considered, I am concerned about his comorbidities as well  as lack of definitive evidence of muscle invasive disease to put him through possible toxicities of cisplatin.  I would therefore recommend a nonplatinum based regimen for him with a combination of 5-FU mitomycin concurrent with radiation.  5-FU would be given from day 1 to day 5 is a continuous infusion and day 16 today 20 concurrent with radiation at 500 mg per metered square per day.  Mitomycin will be given at 12 mg per metered square on day 1.  Discussed risks and benefits of chemotherapy including all but not limited to nausea, vomiting, low blood counts, risk of infections and hospitalizations.  Patient will need a port placement for chemotherapy as well as chemo teach.  Treatment will be given with a curative intent  I will tentatively see him back in 2 weeks time for the start of 5-FU chemotherapy    Thank you for this kind referral and the opportunity to participate in the care of this patient   Visit Diagnosis 1. Malignant neoplasm of overlapping sites of bladder ([place])   2. Goals of care, counseling/discussion     Dr. Randa Evens, MD, MPH [facility] [phone] 06/18/2020 9:50 AM

## 2020-06-19 ENCOUNTER — Other Ambulatory Visit (INDEPENDENT_AMBULATORY_CARE_PROVIDER_SITE_OTHER): Payer: Self-pay | Admitting: Nurse Practitioner

## 2020-06-19 ENCOUNTER — Ambulatory Visit
Admission: RE | Admit: 2020-06-19 | Discharge: 2020-06-19 | Disposition: A | Discharge status: Home / Self Care | Payer: Medicare HMO | Attending: Vascular Surgery | Admitting: Vascular Surgery

## 2020-06-19 ENCOUNTER — Other Ambulatory Visit: Payer: Self-pay

## 2020-06-19 ENCOUNTER — Encounter
Admission: RE | Disposition: A | Discharge status: Home / Self Care | Payer: Self-pay | Source: Home / Self Care | Attending: Vascular Surgery

## 2020-06-19 ENCOUNTER — Encounter: Payer: Self-pay | Admitting: Vascular Surgery

## 2020-06-19 DIAGNOSIS — Z79899 Other long term (current) drug therapy: Secondary | ICD-10-CM | POA: Insufficient documentation

## 2020-06-19 DIAGNOSIS — Z9981 Dependence on supplemental oxygen: Secondary | ICD-10-CM | POA: Insufficient documentation

## 2020-06-19 DIAGNOSIS — C679 Malignant neoplasm of bladder, unspecified: Secondary | ICD-10-CM | POA: Diagnosis present

## 2020-06-19 DIAGNOSIS — E785 Hyperlipidemia, unspecified: Secondary | ICD-10-CM | POA: Diagnosis not present

## 2020-06-19 DIAGNOSIS — I10 Essential (primary) hypertension: Secondary | ICD-10-CM | POA: Insufficient documentation

## 2020-06-19 DIAGNOSIS — J449 Chronic obstructive pulmonary disease, unspecified: Secondary | ICD-10-CM | POA: Diagnosis not present

## 2020-06-19 DIAGNOSIS — Z87891 Personal history of nicotine dependence: Secondary | ICD-10-CM | POA: Diagnosis not present

## 2020-06-19 HISTORY — PX: PORTA CATH INSERTION: CATH118285

## 2020-06-19 SURGERY — PORTA CATH INSERTION
Anesthesia: Moderate Sedation

## 2020-06-19 MED ORDER — FAMOTIDINE 20 MG PO TABS
ORAL_TABLET | ORAL | Status: AC
  Filled 2020-06-19: qty 2

## 2020-06-19 MED ORDER — METHYLPREDNISOLONE SODIUM SUCC 125 MG IJ SOLR
INTRAMUSCULAR | Status: AC
  Administered 2020-06-19: 125 mg via INTRAVENOUS
  Filled 2020-06-19: qty 2

## 2020-06-19 MED ORDER — CEFAZOLIN SODIUM-DEXTROSE 2-4 GM/100ML-% IV SOLN
INTRAVENOUS | Status: AC
  Administered 2020-06-19: 2 g via INTRAVENOUS
  Filled 2020-06-19: qty 100

## 2020-06-19 MED ORDER — FAMOTIDINE 20 MG PO TABS
40.0000 mg | ORAL_TABLET | Freq: Once | ORAL | Status: AC | PRN
  Administered 2020-06-19: 40 mg via ORAL

## 2020-06-19 MED ORDER — ONDANSETRON HCL 4 MG/2ML IJ SOLN: 4.0000 mg | Freq: Four times a day (QID) | INTRAMUSCULAR | Status: DC | PRN

## 2020-06-19 MED ORDER — CEFAZOLIN SODIUM-DEXTROSE 2-4 GM/100ML-% IV SOLN: 2.0000 g | Freq: Once | INTRAVENOUS | Status: AC

## 2020-06-19 MED ORDER — DIPHENHYDRAMINE HCL 50 MG/ML IJ SOLN: 50.0000 mg | Freq: Once | INTRAMUSCULAR | Status: AC | PRN

## 2020-06-19 MED ORDER — SODIUM CHLORIDE 0.9 % IV SOLN: INTRAVENOUS | Status: DC

## 2020-06-19 MED ORDER — MIDAZOLAM HCL 2 MG/2ML IJ SOLN
INTRAMUSCULAR | Status: AC
  Filled 2020-06-19: qty 2

## 2020-06-19 MED ORDER — HYDROMORPHONE HCL 1 MG/ML IJ SOLN: 1.0000 mg | Freq: Once | INTRAMUSCULAR | Status: DC | PRN

## 2020-06-19 MED ORDER — MIDAZOLAM HCL 2 MG/2ML IJ SOLN
INTRAMUSCULAR | Status: DC | PRN
  Administered 2020-06-19: 2 mg via INTRAVENOUS

## 2020-06-19 MED ORDER — FENTANYL CITRATE (PF) 100 MCG/2ML IJ SOLN
INTRAMUSCULAR | Status: DC | PRN
  Administered 2020-06-19: 50 ug via INTRAVENOUS

## 2020-06-19 MED ORDER — METHYLPREDNISOLONE SODIUM SUCC 125 MG IJ SOLR: 125.0000 mg | Freq: Once | INTRAMUSCULAR | Status: AC | PRN

## 2020-06-19 MED ORDER — MIDAZOLAM HCL 2 MG/ML PO SYRP: 8.0000 mg | ORAL_SOLUTION | Freq: Once | ORAL | Status: DC | PRN

## 2020-06-19 MED ORDER — CHLORHEXIDINE GLUCONATE CLOTH 2 % EX PADS: 6.0000 | MEDICATED_PAD | Freq: Every day | CUTANEOUS | Status: DC

## 2020-06-19 MED ORDER — SODIUM CHLORIDE 0.9 % IV SOLN
Freq: Once | INTRAVENOUS | Status: DC
  Filled 2020-06-19: qty 2

## 2020-06-19 MED ORDER — DIPHENHYDRAMINE HCL 50 MG/ML IJ SOLN
INTRAMUSCULAR | Status: AC
  Administered 2020-06-19: 50 mg via INTRAVENOUS
  Filled 2020-06-19: qty 1

## 2020-06-19 MED ORDER — FENTANYL CITRATE (PF) 100 MCG/2ML IJ SOLN
INTRAMUSCULAR | Status: AC
  Filled 2020-06-19: qty 2

## 2020-06-19 SURGICAL SUPPLY — 11 items
ADH SKN CLS APL DERMABOND .7 (GAUZE/BANDAGES/DRESSINGS) ×1
DERMABOND ADVANCED (GAUZE/BANDAGES/DRESSINGS) ×1
DERMABOND ADVANCED .7 DNX12 (GAUZE/BANDAGES/DRESSINGS) ×1 IMPLANT
KIT PORT POWER 8FR ISP CVUE (Port) ×2 IMPLANT
PACK ANGIOGRAPHY (CUSTOM PROCEDURE TRAY) ×2 IMPLANT
SUT MNCRL 4-0 (SUTURE) ×2
SUT MNCRL 4-0 27XMFL (SUTURE) ×1
SUT VIC AB 3-0 SH 27 (SUTURE) ×2
SUT VIC AB 3-0 SH 27X BRD (SUTURE) ×1 IMPLANT
SUTURE MNCRL 4-0 27XMF (SUTURE) ×1 IMPLANT
TOWEL OR 17X26 4PK STRL BLUE (TOWEL DISPOSABLE) ×2 IMPLANT

## 2020-06-19 NOTE — Op Note (Signed)
      [facility]       Operative Note  Date: 06/19/2020  Preoperative diagnosis:  1. Bladder cancer  Postoperative diagnosis:  Same as above  Procedures: #1. Ultrasound guidance for vascular access to the right internal jugular vein. #2. Fluoroscopic guidance for placement of catheter. #3. Placement of CT compatible Port-A-Cath, right internal jugular vein.  Surgeon: Leotis Pain, MD.   Anesthesia: Local with moderate conscious sedation for approximately 16  minutes using 2 mg of Versed and 50 mcg of Fentanyl  Fluoroscopy time: less than 1 minute  Contrast used: 0  Estimated blood loss: 5 cc  Indication for the procedure:  The patient is a 75 y.o.male with bladder cancer.  The patient needs a Port-A-Cath for durable venous access, chemotherapy, lab draws, and CT scans. We are asked to place this. Risks and benefits were discussed and informed consent was obtained.  Description of procedure: The patient was brought to the vascular and interventional radiology suite.  Moderate conscious sedation was administered throughout the procedure during a face to face encounter with the patient with my supervision of the RN administering medicines and monitoring the patient's vital signs, pulse oximetry, telemetry and mental status throughout from the start of the procedure until the patient was taken to the recovery room. The right neck chest and shoulder were sterilely prepped and draped, and a sterile surgical field was created. Ultrasound was used to help visualize a patent right internal jugular vein. This was then accessed under direct ultrasound guidance without difficulty with the Seldinger needle and a permanent image was recorded. A J-wire was placed. After skin nick and dilatation, the peel-away sheath was then placed over the wire. I then anesthetized an area under the clavicle approximately 1-2 fingerbreadths. A transverse incision was created and an inferior pocket was  created with electrocautery and blunt dissection. The port was then brought onto the field, placed into the pocket and secured to the chest wall with 2 Prolene sutures. The catheter was connected to the port and tunneled from the subclavicular incision to the access site. Fluoroscopic guidance was then used to cut the catheter to an appropriate length. The catheter was then placed through the peel-away sheath and the peel-away sheath was removed. The catheter tip was parked in excellent location under fluorocoscopic guidance in the SVC just above the right atrium. The pocket was then irrigated with antibiotic impregnated saline and the wound was closed with a running 3-0 Vicryl and a 4-0 Monocryl. The access incision was closed with a single 4-0 Monocryl. The Huber needle was used to withdraw blood and flush the port with heparinized saline. Dermabond was then placed as a dressing. The patient tolerated the procedure well and was taken to the recovery room in stable condition.   Leotis Pain 06/19/2020 4:31 PM   This note was created with Dragon Medical transcription system. Any errors in dictation are purely unintentional.

## 2020-06-19 NOTE — Progress Notes (Signed)
Patient clinically stable post PORT placement per Dr Lucky Cowboy, tolerated well. Daughter at bedside post procedure with Dr Lucky Cowboy and interpreter here with questions answered and discharge instructions given, vitals stable. Awake/alert and oriented post procedure. Denies complaints at this time.

## 2020-06-19 NOTE — Interval H&P Note (Signed)
History and Physical Interval Note:  06/19/2020 12:05 PM  Joe Torres  has presented today for surgery, with the diagnosis of [facility] Cath Placement  Bladder Ca   Spanish Interpreter Requested  Covid Feb 4.  The various methods of treatment have been discussed with the patient and family. After consideration of risks, benefits and other options for treatment, the patient has consented to  Procedure(s): PORTA CATH INSERTION (N/A) as a surgical intervention.  The patient's history has been reviewed, patient examined, no change in status, stable for surgery.  I have reviewed the patient's chart and labs.  Questions were answered to the patient's satisfaction.     Leotis Pain

## 2020-06-20 ENCOUNTER — Encounter: Payer: Self-pay | Admitting: Vascular Surgery

## 2020-06-20 NOTE — Patient Instructions (Signed)
Mitomycin injection Qu es este medicamento? La MITOMICINA es un agente quimioteraputico. Este medicamento se utiliza en el tratamiento del cncer de estmago y de pncreas. Este medicamento puede ser utilizado para otros usos; si tiene alguna pregunta consulte con su proveedor de atencin mdica o con su farmacutico. MARCAS COMUNES: Mutamycin Qu le debo informar a mi profesional de la salud antes de tomar este medicamento? Necesitan saber si usted presenta alguno de los [employer] o situaciones: trastornos de sangrado infeccin (especialmente infecciones virales, como varicela, fuegos labiales o herpes) conteos sanguneos bajos, como baja cantidad de glbulos blancos, plaquetas o glbulos rojos enfermedad renal una reaccin alrgica o inusual a la mitomicina, a otros medicamentos, alimentos, colorantes o conservantes si est embarazada o buscando quedar embarazada si est amamantando a un beb Cmo debo utilizar este medicamento? Este medicamento se administra mediante infusin o inyeccin por va intravenosa. Lo administra un profesional de la salud calificado en un hospital o en un entorno clnico. Hable con su pediatra para informarse acerca del uso de este medicamento en nios. Puede requerir atencin especial. Sobredosis: Pngase en contacto inmediatamente con un centro toxicolgico o una sala de urgencia si usted cree que haya tomado demasiado medicamento. ATENCIN: [employer] es solo para usted. No comparta este medicamento con nadie. Qu sucede si me olvido de una dosis? Es importante no olvidar ninguna dosis. Informe a su mdico o a su profesional de la salud si no puede asistir a Photographer. Qu puede interactuar con este medicamento? No se anticipan interacciones. Puede ser que esta lista no menciona todas las posibles interacciones. Informe a su profesional de [employer]	[place] de [employer] productos a base de hierbas, medicamentos de [place] o suplementos nutritivos  que est tomando. Si usted fuma, consume bebidas alcohlicas o si utiliza drogas ilegales, indqueselo tambin a su profesional de [employer]	[place]. Algunas sustancias pueden interactuar con su medicamento. A qu debo estar atento al usar Coca[employer]Cola? Se supervisar su condicin atentamente mientras reciba este medicamento. Tendr que hacerse anlisis de sangre peridicos mientras est tomando este medicamento. Este medicamento puede hacerle sentir un Nurse, mental health. Esto es normal ya que la quimioterapia afecta tanto a las clulas sanas como a las clulas cancerosas. Si presenta alguno de los [employer], infrmelos. Sin embargo, contine con el tratamiento aun si se siente enfermo, a menos que su mdico le indique que lo suspenda. Consulte a su mdico o a su profesional de la salud por asesoramiento si tiene fiebre, escalofros, dolor de garganta o cualquier otro sntoma de resfro o gripe. No se trate usted mismo. Este medicamento puede reducir la capacidad del cuerpo para combatir infecciones. Trate de no acercarse a personas que estn enfermas. [employer] puede aumentar el riesgo de magulladuras o sangrado. Consulte a su mdico o a su profesional de la salud si observa sangrados inusuales. Proceda con cuidado al cepillar sus dientes, usar hilo dental o Risk manager palillos para los dientes, ya que puede contraer una infeccin o Therapist, art con mayor facilidad. Si se somete a algn tratamiento dental, informe a su dentista que est [employer]. Evite tomar productos que contienen aspirina, acetaminofeno, ibuprofeno, naproxeno o quetoprofeno a menos que as lo indique su mdico. Estos productos pueden disimular la fiebre. No se debe quedar embarazada mientras est tomando [employer]a. Las mujeres deben informar a su mdico si estn buscando quedar embarazadas o si creen que estn embarazadas. Existe la posibilidad de efectos secundarios graves a un beb sin nacer. Para ms  informacin  hable con su profesional de la salud o su farmacutico. No debe Economist a un beb mientras est tomando este medicamento. Qu efectos secundarios puedo tener al [employer] este medicamento? Efectos secundarios que debe informar a su mdico o a Barrister's clerk de la salud tan pronto como sea posible: Chief of Staff, como erupcin cutnea, comezn/picazn o urticaria, e hinchazn de la cara, los labios o la lengua problemas para Contractor, enrojecimiento o Actor de la inyeccin signos y sntomas de Teacher, music, tales como heces con sangre o de color negro y aspecto alquitranado; [place] de color rojo o marrn oscuro; escupir sangre o material marrn que tiene el aspecto de granos de caf molido; Tree surgeon rojas en la piel; sangrado o moretones inusuales en los ojos, las encas o la nariz signos y sntomas de infeccin, tales como fiebre, escalofros, tos, Social research officer, government de garganta, Social research officer, government o dificultad para Garment/textile technologist signos y sntomas de lesin al rin, tales como dificultad para orinar o cambios en la cantidad de orina signos y sntomas de baja cantidad de glbulos rojos o anemia, tales como debilidad o cansancio inusual; sensacin de desmayo o aturdimiento; cadas; problemas para respirar Efectos secundarios que generalmente no requieren atencin mdica (infrmelos a su mdico o a su profesional de la salud si persisten o si son molestos): color de orina verde a azul cada del cabello prdida del apetito llagas en la boca nuseas, vmito Puede ser que [employer] no menciona todos los posibles efectos secundarios. Comunquese a su mdico por asesoramiento mdico [employer] Usted puede informar los efectos secundarios a la FDA por telfono al 1-800-FDA-1088. Dnde debo guardar mi medicina? Este medicamento se administra en hospitales o clnicas y no necesitar guardarlo en su domicilio. ATENCIN: Este folleto es un resumen. Puede ser que no cubra toda la posible  informacin. Si usted tiene preguntas acerca de esta medicina, consulte con su mdico, su farmacutico o su profesional de Technical sales engineer.  2021 Elsevier/Gold Standard (2019-03-03 00:00:00) Fluorouracil, 5-FU injection Qu es este medicamento? [place], 5-FU es un agente quimioteraputico. Desacelera el crecimiento de clulas cancerosas. Este medicamento se [place] para tratar muchos tipos de cncer, tales como cncer de mama, cncer de colon o rectal, cncer de pncreas y cncer de estmago. Este medicamento puede ser utilizado para otros usos; si tiene alguna pregunta consulte con su proveedor de atencin mdica o con su farmacutico. MARCAS COMUNES: Adrucil Qu le debo informar a mi profesional de la salud antes de tomar este medicamento? Necesitan saber si usted presenta alguno de los siguientes problemas o situaciones: trastornos sanguneos deficiencia de dihidropirimidina deshidrogenasa (DPD) infeccin (especialmente infecciones virales, como varicela, fuegos labiales o herpes) enfermedad renal enfermedad heptica desnutricin, nutricin deficiente terapia de radiacin en curso o reciente una reaccin alrgica o inusual al fluorouracilo, a otros medicamentos quimioteraputicos, a otros medicamentos, alimentos, colorantes o conservantes si est embarazada o buscando quedar embarazada si est amamantando a un beb Cmo debo utilizar este medicamento? Este medicamento se administra como infusin o inyeccin en una vena. Un profesional de la salud especialmente capacitado lo administra en un hospital o clnica. Hable con su pediatra para informarse acerca del uso de este medicamento en nios. Puede requerir atencin especial. Sobredosis: Pngase en contacto inmediatamente con un centro toxicolgico o una sala de urgencia si usted cree que haya tomado demasiado medicamento. ATENCIN: [employer] es solo para usted. No comparta este medicamento con nadie. Qu sucede si me olvido de una  dosis? Es importante no olvidar ninguna dosis.  Informe a su mdico o a su profesional de la salud si no puede asistir a Photographer. Qu puede interactuar con este medicamento? No use este medicamento con ninguno de los siguientes frmacos: vacunas de virus vivos Este medicamento tambin puede interactuar con los siguientes frmacos: medicamentos que tratan o previenen cogulos sanguneos, tales como warfarina, enoxaparina y dalteparina Puede ser que esta lista no menciona todas las posibles interacciones. Informe a su profesional de [employer]	[place] de [employer] productos a base de hierbas, medicamentos de [place] o suplementos nutritivos que est tomando. Si usted fuma, consume bebidas alcohlicas o si utiliza drogas ilegales, indqueselo tambin a su profesional de [employer]	[place]. Algunas sustancias pueden interactuar con su medicamento. A qu debo estar atento al usar Coca-Cola? Visite a su mdico para que revise su evolucin. Este medicamento podra hacerle sentir un Nurse, mental health. Esto es normal, ya que la quimioterapia puede afectar tanto a las clulas sanas como a las clulas cancerosas. Si presenta algn efecto secundario, infrmelo. Contine con el tratamiento aun si se siente enfermo, a menos que su mdico le indique que lo suspenda. En algunos casos, podra recibir [employer] para ayudarlo con los efectos secundarios. Siga todas las instrucciones para usarlos. Llame a su mdico o a su profesional de la salud si tiene fiebre, escalofros o dolor de garganta, o cualquier otro sntoma de resfro o gripe. No se trate usted mismo. Este medicamento reduce la capacidad del cuerpo para combatir infecciones. Trate de no acercarse a personas que estn enfermas. Este medicamento podra aumentar el riesgo de moretones o sangrado. Consulte a su mdico o a su profesional de la salud si observa sangrados inusuales. Proceda con cuidado al cepillar sus dientes, usar hilo dental o Risk manager  palillos para los dientes, ya que podra contraer una infeccin o Therapist, art con mayor facilidad. Si se somete a algn tratamiento dental, informe a su dentista que est [employer]. Evite usar productos que contienen aspirina, acetaminofeno, ibuprofeno, naproxeno o ketoprofeno, a menos que as lo indique su mdico. Estos productos pueden ocultar la fiebre. No debe quedar embarazada mientras est [employer]. Las mujeres deben informar a su mdico si estn buscando quedar embarazadas o si creen que podran estar embarazadas. Existe la posibilidad de efectos secundarios graves en un beb sin nacer. Para obtener ms informacin, hable con su profesional de la salud o su farmacutico. No debe Economist a un beb mientras est usando este medicamento. Los hombres deben informar a su mdico si quieren tener hijos. Este medicamento puede reducir el recuento de esperma. No trate la diarrea con productos de [employer]. Contacte a su mdico si tiene diarrea por ms de 2 das, o si es grave y [place]. Este medicamento puede aumentar su sensibilidad al sol. Evite la [employer]. Si no la Product manager, utilice ropa protectora y crema de Photographer. No utilice lmparas solares, camas solares ni cabinas solares. Qu efectos secundarios puedo tener al [employer] este medicamento? Efectos secundarios que debe informar a su mdico o a Barrister's clerk de la salud tan pronto como sea posible: Chief of Staff, tales como erupcin cutnea, comezn/picazn o urticaria, e hinchazn de la cara, los labios o la lengua recuentos sanguneos bajos: este medicamento podra reducir la cantidad de glbulos blancos, glbulos rojos y plaquetas. Su riesgo de infeccin y sangrado podra ser mayor. signos de infeccin: fiebre o escalofros, tos, dolor de garganta, dolor o dificultad para orinar signos de disminucin en la cantidad de plaquetas o sangrado: moretones,  puntos rojos en la piel, heces de color negro y  aspecto alquitranado, sangre en la orina signos de disminucin en la cantidad de glbulos rojos: debilidad o cansancio inusuales, Youth worker, aturdimiento problemas para respirar cambios en la visin Market researcher en la boca nuseas y vmito dolor, hinchazn, enrojecimiento en TEFL teacher de Air cabin crew, hormigueo o entumecimiento de las manos o los pies enrojecimiento, hinchazn o Pension scheme manager en las manos o los pies dolor estomacal sangrado inusual Efectos secundarios que generalmente no requieren atencin mdica (infrmelos a su mdico o a su profesional de la salud si persisten o si son molestos): cambios en las uas de los pies o de las manos diarrea comezn/picazn o sequedad de la piel cada del cabello dolor de cabeza prdida del apetito sensibilidad de los ojos a la luz Tree surgeon estomacal ojos inusualmente llorosos Puede ser que esta lista no menciona todos los posibles efectos secundarios. Comunquese a su mdico por asesoramiento mdico [employer] Usted puede informar los efectos secundarios a la FDA por telfono al 1-800-FDA-1088. Dnde debo guardar mi medicina? Este medicamento se administra en hospitales o clnicas, y no necesitar guardarlo en su domicilio. ATENCIN: Este folleto es un resumen. Puede ser que no cubra toda la posible informacin. Si usted tiene preguntas acerca de esta medicina, consulte con su mdico, su farmacutico o su profesional de Technical sales engineer.  2021 Elsevier/Gold Standard (2019-09-02 00:00:00)

## 2020-06-21 ENCOUNTER — Inpatient Hospital Stay: Payer: Medicare HMO

## 2020-06-21 ENCOUNTER — Ambulatory Visit: Admission: RE | Admit: 2020-06-21 | Payer: Medicare HMO | Source: Ambulatory Visit

## 2020-06-21 ENCOUNTER — Other Ambulatory Visit: Payer: Self-pay

## 2020-06-21 DIAGNOSIS — Z510 Encounter for antineoplastic radiation therapy: Secondary | ICD-10-CM | POA: Diagnosis not present

## 2020-06-22 ENCOUNTER — Inpatient Hospital Stay: Payer: Medicare HMO

## 2020-06-22 ENCOUNTER — Ambulatory Visit
Admission: RE | Admit: 2020-06-22 | Discharge: 2020-06-22 | Disposition: A | Discharge status: Home / Self Care | Payer: Medicare HMO | Source: Ambulatory Visit | Attending: Radiation Oncology | Admitting: Radiation Oncology

## 2020-06-22 ENCOUNTER — Other Ambulatory Visit: Payer: Self-pay | Admitting: Oncology

## 2020-06-22 ENCOUNTER — Telehealth: Payer: Self-pay | Admitting: *Deleted

## 2020-06-22 ENCOUNTER — Inpatient Hospital Stay: Payer: Medicare HMO | Admitting: Oncology

## 2020-06-22 VITALS — BP 130/78 | HR 88 | Temp 98.5°F | Resp 20 | Wt 150.2 lb

## 2020-06-22 DIAGNOSIS — C678 Malignant neoplasm of overlapping sites of bladder: Secondary | ICD-10-CM

## 2020-06-22 DIAGNOSIS — Z510 Encounter for antineoplastic radiation therapy: Secondary | ICD-10-CM | POA: Diagnosis not present

## 2020-06-22 DIAGNOSIS — Z5111 Encounter for antineoplastic chemotherapy: Secondary | ICD-10-CM | POA: Diagnosis not present

## 2020-06-22 MED ORDER — MITOMYCIN CHEMO IV INJECTION 20 MG
11.5000 mg/m2 | Freq: Once | INTRAVENOUS | Status: AC
  Administered 2020-06-22: 20 mg via INTRAVENOUS
  Filled 2020-06-22: qty 40

## 2020-06-22 MED ORDER — MITOMYCIN CHEMO IV INJECTION 40 MG
12.0000 mg/m2 | Freq: Once | INTRAVENOUS | Status: DC
Start: 2020-06-22 — End: 2020-06-22

## 2020-06-22 MED ORDER — SODIUM CHLORIDE 0.9% FLUSH
10.0000 mL | INTRAVENOUS | Status: DC | PRN
  Administered 2020-06-22 (×2): 10 mL
  Filled 2020-06-22: qty 10

## 2020-06-22 MED ORDER — PROCHLORPERAZINE MALEATE 10 MG PO TABS
10.0000 mg | ORAL_TABLET | Freq: Once | ORAL | Status: AC
  Administered 2020-06-22: 10 mg via ORAL
  Filled 2020-06-22: qty 1

## 2020-06-22 MED ORDER — HEPARIN SOD (PORK) LOCK FLUSH 100 UNIT/ML IV SOLN
500.0000 [IU] | Freq: Once | INTRAVENOUS | Status: DC | PRN
  Filled 2020-06-22: qty 5

## 2020-06-22 MED ORDER — SODIUM CHLORIDE 0.9 % IV SOLN
500.0000 mg/m2/d | INTRAVENOUS | Status: DC
  Administered 2020-06-22: 4500 mg via INTRAVENOUS
  Filled 2020-06-22: qty 90

## 2020-06-22 MED ORDER — SODIUM CHLORIDE 0.9 % IV SOLN
Freq: Once | INTRAVENOUS | Status: AC
  Filled 2020-06-22: qty 250

## 2020-06-22 NOTE — Telephone Encounter (Signed)
Call returned to [facility] and explained to her that any questions she has will be answered Tuesday. She stated that is fine and thanked me for calling back

## 2020-06-22 NOTE — Telephone Encounter (Signed)
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

## 2020-06-22 NOTE — Telephone Encounter (Signed)
It was after you spoke with her, she said she had spoken with you, then she spoke with her daughter who went to the chemotherapy class

## 2020-06-22 NOTE — Progress Notes (Signed)
1250- Patient tolerated treatment well. Patient stable and discharged to home with Fluorouracil Continuous Infusion Pump in place.

## 2020-06-22 NOTE — Telephone Encounter (Signed)
Hassan Rowan I have spoken to daughter susana this morning at 10 am and she said she had no questions. I see again on Tuesday and will answer whatever questions they have in person

## 2020-06-22 NOTE — Telephone Encounter (Signed)
When was this call? I just spoke to her this morning

## 2020-06-23 ENCOUNTER — Ambulatory Visit
Admission: RE | Admit: 2020-06-23 | Discharge: 2020-06-23 | Disposition: A | Discharge status: Home / Self Care | Payer: Medicare HMO | Source: Ambulatory Visit | Attending: Radiation Oncology | Admitting: Radiation Oncology

## 2020-06-23 DIAGNOSIS — Z510 Encounter for antineoplastic radiation therapy: Secondary | ICD-10-CM | POA: Diagnosis not present

## 2020-06-26 ENCOUNTER — Telehealth: Payer: Self-pay | Admitting: *Deleted

## 2020-06-26 ENCOUNTER — Ambulatory Visit
Admission: RE | Admit: 2020-06-26 | Discharge: 2020-06-26 | Disposition: A | Discharge status: Home / Self Care | Payer: Medicare HMO | Source: Ambulatory Visit | Attending: Radiation Oncology | Admitting: Radiation Oncology

## 2020-06-26 ENCOUNTER — Other Ambulatory Visit: Payer: Self-pay | Admitting: *Deleted

## 2020-06-26 DIAGNOSIS — Z510 Encounter for antineoplastic radiation therapy: Secondary | ICD-10-CM | POA: Diagnosis not present

## 2020-06-26 MED ORDER — MAGIC MOUTHWASH W/LIDOCAINE: 5.0000 mL | Freq: Four times a day (QID) | ORAL | 1 refills | Status: DC | PRN

## 2020-06-26 NOTE — Telephone Encounter (Signed)
Pt having sores in his mouth. [name] asked me to send MMW and I faxed it to his pharmacy. Pt will be coming here tom.

## 2020-06-27 ENCOUNTER — Ambulatory Visit
Admission: RE | Admit: 2020-06-27 | Discharge: 2020-06-27 | Disposition: A | Discharge status: Home / Self Care | Payer: Medicare HMO | Source: Ambulatory Visit | Attending: Radiation Oncology | Admitting: Radiation Oncology

## 2020-06-27 ENCOUNTER — Inpatient Hospital Stay: Payer: Medicare HMO

## 2020-06-27 ENCOUNTER — Encounter: Payer: Self-pay | Admitting: Oncology

## 2020-06-27 ENCOUNTER — Inpatient Hospital Stay (HOSPITAL_BASED_OUTPATIENT_CLINIC_OR_DEPARTMENT_OTHER): Payer: Medicare HMO | Admitting: Oncology

## 2020-06-27 VITALS — BP 124/78 | HR 99 | Temp 98.3°F | Wt 148.6 lb

## 2020-06-27 DIAGNOSIS — Z5111 Encounter for antineoplastic chemotherapy: Secondary | ICD-10-CM | POA: Diagnosis not present

## 2020-06-27 DIAGNOSIS — C678 Malignant neoplasm of overlapping sites of bladder: Secondary | ICD-10-CM

## 2020-06-27 DIAGNOSIS — Z510 Encounter for antineoplastic radiation therapy: Secondary | ICD-10-CM | POA: Diagnosis not present

## 2020-06-27 LAB — CBC WITH DIFFERENTIAL/PLATELET
Abs Immature Granulocytes: 0.09 10*3/uL — ABNORMAL HIGH (ref 0.00–0.07)
Basophils Absolute: 0 10*3/uL (ref 0.0–0.1)
Basophils Relative: 0 %
Eosinophils Absolute: 0 10*3/uL (ref 0.0–0.5)
Eosinophils Relative: 0 %
HCT: 38.8 % — ABNORMAL LOW (ref 39.0–52.0)
Hemoglobin: 13.1 g/dL (ref 13.0–17.0)
Immature Granulocytes: 1 %
Lymphocytes Relative: 3 %
Lymphs Abs: 0.3 10*3/uL — ABNORMAL LOW (ref 0.7–4.0)
MCH: 29.5 pg (ref 26.0–34.0)
MCHC: 33.8 g/dL (ref 30.0–36.0)
MCV: 87.4 fL (ref 80.0–100.0)
Monocytes Absolute: 0.2 10*3/uL (ref 0.1–1.0)
Monocytes Relative: 2 %
Neutro Abs: 10.5 10*3/uL — ABNORMAL HIGH (ref 1.7–7.7)
Neutrophils Relative %: 94 %
Platelets: 240 10*3/uL (ref 150–400)
RBC: 4.44 MIL/uL (ref 4.22–5.81)
RDW: 12.5 % (ref 11.5–15.5)
WBC: 11.1 10*3/uL — ABNORMAL HIGH (ref 4.0–10.5)
nRBC: 0 % (ref 0.0–0.2)

## 2020-06-27 LAB — COMPREHENSIVE METABOLIC PANEL
ALT: 22 U/L (ref 0–44)
AST: 23 U/L (ref 15–41)
Albumin: 4.1 g/dL (ref 3.5–5.0)
Alkaline Phosphatase: 63 U/L (ref 38–126)
Anion gap: 12 (ref 5–15)
BUN: 18 mg/dL (ref 8–23)
CO2: 28 mmol/L (ref 22–32)
Calcium: 8.8 mg/dL — ABNORMAL LOW (ref 8.9–10.3)
Chloride: 99 mmol/L (ref 98–111)
Creatinine, Ser: 0.85 mg/dL (ref 0.61–1.24)
GFR, Estimated: >60 mL/min (ref >60)
Glucose, Bld: 125 mg/dL — ABNORMAL HIGH (ref 70–99)
Potassium: 3.7 mmol/L (ref 3.5–5.1)
Sodium: 139 mmol/L (ref 135–145)
Total Bilirubin: 1 mg/dL (ref 0.3–1.2)
Total Protein: 7.2 g/dL (ref 6.5–8.1)

## 2020-06-27 MED ORDER — SODIUM CHLORIDE 0.9% FLUSH
10.0000 mL | INTRAVENOUS | Status: DC | PRN
  Administered 2020-06-27: 10 mL
  Filled 2020-06-27: qty 10

## 2020-06-27 MED ORDER — HEPARIN SOD (PORK) LOCK FLUSH 100 UNIT/ML IV SOLN
500.0000 [IU] | Freq: Once | INTRAVENOUS | Status: AC | PRN
  Administered 2020-06-27: 500 [IU]
  Filled 2020-06-27: qty 5

## 2020-06-27 MED ORDER — HEPARIN SOD (PORK) LOCK FLUSH 100 UNIT/ML IV SOLN
INTRAVENOUS | Status: AC
  Filled 2020-06-27: qty 5

## 2020-06-27 NOTE — Progress Notes (Signed)
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  History obtained with the help of Spanish interpreter.  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  He presented with symptoms of gross hematuria and underwent CT urogram which showed 2 lesions in the bladder 1 measuring 2.1 x 2.1 cm in the posterior bladder wall as well as an eccentric mass just left of the midline measuring 1.6 x 1.1 cm.  Patient was seen by urology and underwent cystoscopy.  Visualization was limited due to gross hematuria.  Large at least 2 cm spherical high-grade appearing tumor in the midline posterior bladder wall.  This was followed by TURBT which showed a 2.5 cm posterior bladder tumor appearing necrotic/hemorrhagic another 2 cm nodular area which had a submucosal solid infiltrative appearance.  Significant bladder varices and hypervascularity surrounding tumors.  Pathology showed invasive urothelial carcinoma high-grade with high suspicion for muscularis propria involvement.  Scattered small fragments of muscularis propria identified an invasive tumor was seen adjacent to these muscle bundles.  However unequivocal muscle invasion was not readily apparent.  Patient could not urinate following his TURBT and required temporary placement of Foley catheter but was able to pass his trial of void later.  Risk of repeat TURBT was discussed and mutual consensus was to avoid another invasive procedure given his oxygen dependent COPD.  Case  discussed at tumor board.  He was not deemed to be a good candidate for cystoprostatectomy.  Based on the appearance of the tumor at the time of TURBT as well as pathology, it was deemed that there was a high risk of muscle invasion.   Patient receiving concurrent chemoradiation with 5-FU mitomycin.  Day 1 given on 06/20/2020 along with 1 dose of mitomycin  Interval history-patient tolerated chemotherapy well.  And he denies any significant nausea or vomiting.  He did have some itching following chemotherapy for which he has been taking as needed Benadryl.  Itching has improved significantly.  He also reported discomfort and burning in his mouth for which she was given Magic mouthwash.  Reports symptoms have improved presently  ECOG PS- 2 Pain scale- 0   Review of systems- Review of Systems  Constitutional: Positive for malaise/fatigue. Negative for chills, fever and weight loss.  HENT: Negative for congestion, ear discharge and nosebleeds.   Eyes: Negative for blurred vision.  Respiratory: Negative for cough, hemoptysis, sputum production, shortness of breath and wheezing.   Cardiovascular: Negative for chest pain, palpitations, orthopnea and claudication.  Gastrointestinal: Negative for abdominal pain, blood in stool, constipation, diarrhea, heartburn, melena, nausea and vomiting.  Genitourinary: Negative for dysuria, flank pain, frequency, hematuria and urgency.  Musculoskeletal: Negative for back pain, joint pain and myalgias.  Skin: Negative for rash.  Neurological: Negative for dizziness, tingling, focal weakness, seizures, weakness and headaches.  Endo/Heme/Allergies: Does not bruise/bleed easily.  Psychiatric/Behavioral: Negative for depression and suicidal ideas. The patient does not have insomnia.       Allergies  Allergen Reactions  . Shrimp [Shellfish Allergy] Other (See Comments)    Per MD comments  critical     Past Medical History:  Diagnosis Date  . Arthritis   .  Asthma   . Bladder cancer ([place])   . Cancer ([place])   . COPD (chronic obstructive pulmonary disease) ([place])   . Dyspnea   . GERD (gastroesophageal reflux disease)   . Hypertension      Past Surgical History:  Procedure Laterality Date  . COLONOSCOPY    . HERNIA REPAIR  2017  . PORTA CATH INSERTION N/A 06/19/2020   Procedure: PORTA CATH INSERTION;  Surgeon: Algernon Huxley, MD;  Location: [place] [facility];  Service: Cardiovascular;  Laterality: N/A;  . TRANSURETHRAL RESECTION OF BLADDER TUMOR WITH MITOMYCIN-C N/A 05/22/2020   Procedure: TRANSURETHRAL RESECTION OF BLADDER TUMOR WITH Gemcitabine;  Surgeon: Hollice Espy, MD;  Location: [facility] OR[facility];  Service: Urology;  Laterality: N/A;    Social History   Socioeconomic History  . Marital status: Married    Spouse name: Not on file  . Number of children: Not on file  . Years of education: Not on file  . Highest education level: Not on file  Occupational History  . Not on file  Tobacco Use  . Smoking status: Former Smoker    Packs/day: 1.50    Years: 40.00    Pack years: 60.00    Types: Cigarettes    Quit date: 05/21/2008    Years since quitting: 12.1  . Smokeless tobacco: Never Used  Vaping Use  . Vaping Use: Never used  Substance and Sexual Activity  . Alcohol use: Not Currently  . Drug use: Never  . Sexual activity: Not on file  Other Topics Concern  . Not on file  Social History Narrative  . Not on file   Social Determinants of Health   Financial Resource Strain: Not on file  Food Insecurity: Not on file  Transportation Needs: Not on file  Physical Activity: Not on file  Stress: Not on file  Social Connections: Not on file  Intimate Partner Violence: Not on file    No family history on file.   Current Outpatient Medications:  .  acetaminophen (TYLENOL) 500 MG tablet, Take 500 mg by mouth every 8 (eight) hours as needed for moderate pain., Disp: , Rfl:  .  albuterol (PROVENTIL HFA;VENTOLIN HFA) 108 (90 Base)  MCG/ACT inhaler, Inhale 2 puffs into the lungs every 4 (four) hours as needed for wheezing or shortness of breath., Disp: , Rfl:  .  albuterol (PROVENTIL) (2.5 MG/3ML) 0.083% nebulizer solution, Take 2.5 mg by nebulization every 4 (four) hours as needed for wheezing or shortness of breath., Disp: , Rfl:  .  amLODipine (NORVASC) 10 MG tablet, Take 10 mg by mouth daily., Disp: , Rfl:  .  arformoterol (BROVANA) 15 MCG/2ML NEBU, Take 15 mcg by nebulization 2 (two) times daily., Disp: , Rfl:  .  cetirizine (ZYRTEC) 10 MG tablet, Chew 10 mg by mouth daily., Disp: , Rfl:  .  diclofenac Sodium (VOLTAREN) 1 % GEL, Apply 1 application topically 2 (two) times daily as needed (pain)., Disp: , Rfl:  .  hydrochlorothiazide (HYDRODIURIL) 25 MG tablet, Take 25 mg by mouth daily., Disp: , Rfl:  .  lidocaine-prilocaine (EMLA) cream, Apply to affected area once, Disp: 30 g, Rfl: 3 .  LORazepam (ATIVAN) 0.5 MG tablet, Take 1 tablet (0.5 mg total) by mouth every 6 (six) hours as needed (Nausea or vomiting)., Disp: 30 tablet, Rfl: 0 .  magic mouthwash w/lidocaine SOLN, Take 5 mLs by mouth 4 (  four) times daily as needed for mouth pain., Disp: 480 mL, Rfl: 1 .  Mepolizumab (NUCALA) 100 MG SOLR, Inject 100 mg into the skin every 30 (thirty) days., Disp: , Rfl:  .  montelukast (SINGULAIR) 10 MG tablet, Take 10 mg by mouth at bedtime., Disp: , Rfl:  .  ondansetron (ZOFRAN) 8 MG tablet, Take 1 tablet (8 mg total) by mouth 2 (two) times daily as needed (Nausea or vomiting)., Disp: 30 tablet, Rfl: 1 .  pantoprazole (PROTONIX) 40 MG tablet, Take 40 mg by mouth daily., Disp: , Rfl:  .  prochlorperazine (COMPAZINE) 10 MG tablet, Take 1 tablet (10 mg total) by mouth every 6 (six) hours as needed (Nausea or vomiting)., Disp: 30 tablet, Rfl: 1 .  roflumilast (DALIRESP) 500 MCG TABS tablet, Take 500 mcg by mouth daily., Disp: , Rfl:  .  simvastatin (ZOCOR) 20 MG tablet, Take 20 mg by mouth daily., Disp: , Rfl:  .  tamsulosin (FLOMAX)  0.4 MG CAPS capsule, Take 1 capsule (0.4 mg total) by mouth daily., Disp: 30 capsule, Rfl: 0 .  tiotropium (SPIRIVA) 18 MCG inhalation capsule, Place 18 mcg into inhaler and inhale daily., Disp: , Rfl:  .  traMADol (ULTRAM) 50 MG tablet, Take 50 mg by mouth every 6 (six) hours as needed for severe pain., Disp: , Rfl:   Current Facility-Administered Medications:  .  gemcitabine (GEMZAR) chemo syringe for bladder instillation 2,000 mg, 2,000 mg, Bladder Instillation, Once, Hollice Espy, MD  Physical exam:  Vitals:   06/27/20 1356  BP: 124/78  Pulse: 99  Temp: 98.3 F (36.8 C)  TempSrc: Tympanic  SpO2: 95%  Weight: 148 lb 9.6 oz (67.4 kg)   Physical Exam Constitutional:      Comments: On home oxygen  HENT:     Mouth/Throat:     Mouth: Mucous membranes are moist.     Pharynx: Oropharynx is clear.  Eyes:     Extraocular Movements: EOM normal.  Cardiovascular:     Rate and Rhythm: Normal rate and regular rhythm.     Heart sounds: Normal heart sounds.  Skin:    General: Skin is warm and dry.  Neurological:     Mental Status: He is alert and oriented to person, place, and time.      CMP Latest Ref Rng & Units 06/27/2020  Glucose 70 - 99 mg/dL 125(H)  BUN 8 - 23 mg/dL 18  Creatinine 0.61 - 1.24 mg/dL 0.85  Sodium 135 - 145 mmol/L 139  Potassium 3.5 - 5.1 mmol/L 3.7  Chloride 98 - 111 mmol/L 99  CO2 22 - 32 mmol/L 28  Calcium 8.9 - 10.3 mg/dL 8.8(L)  Total Protein 6.5 - 8.1 g/dL 7.2  Total Bilirubin 0.3 - 1.2 mg/dL 1.0  Alkaline Phos 38 - 126 U/L 63  AST 15 - 41 U/L 23  ALT 0 - 44 U/L 22   CBC Latest Ref Rng & Units 06/27/2020  WBC 4.0 - 10.5 K/uL 11.1(H)  Hemoglobin 13.0 - 17.0 g/dL 13.1  Hematocrit 39.0 - 52.0 % 38.8(L)  Platelets 150 - 400 K/uL 240    No images are attached to the encounter.  PERIPHERAL VASCULAR CATHETERIZATION  Result Date: 06/19/2020 See op note    Assessment and plan- Patient is a 75 y.o. male with high-grade urothelial carcinoma stage  II T2 N0 M0 currently undergoing concurrent chemoradiation here for routine follow-up  Patient started radiation treatment on 06/20/2020 and received this 5-FU chemotherapy on day 1 which she  gets continuously for 5 days and he came for pump disconnect today.  He also received mitomycin on day 1.  Today his CBC is within normal limits.  He will be seen by covering NP on 07/18/2020 in my absence to proceed with day 16 today 20 of 5-FU chemotherapy and will get port labs CBC with differential and CMP on that day.  I will see him on 07/25/2020  Continue as needed Benadryl for itching.  No overt skin rash noted today  Continue Magic mouthwash for throat discomfort.  No mucositis seen on today's exam   Visit Diagnosis 1. Malignant neoplasm of overlapping sites of bladder [facility])      Dr. Randa Evens, MD, MPH [facility] at [facility] [phone] 06/27/2020 2:51 PM

## 2020-06-28 ENCOUNTER — Ambulatory Visit
Admission: RE | Admit: 2020-06-28 | Discharge: 2020-06-28 | Disposition: A | Discharge status: Home / Self Care | Payer: Medicare HMO | Source: Ambulatory Visit | Attending: Radiation Oncology | Admitting: Radiation Oncology

## 2020-06-28 DIAGNOSIS — Z510 Encounter for antineoplastic radiation therapy: Secondary | ICD-10-CM | POA: Diagnosis not present

## 2020-06-29 ENCOUNTER — Ambulatory Visit
Admission: RE | Admit: 2020-06-29 | Discharge: 2020-06-29 | Disposition: A | Discharge status: Home / Self Care | Payer: Medicare HMO | Source: Ambulatory Visit | Attending: Radiation Oncology | Admitting: Radiation Oncology

## 2020-06-29 DIAGNOSIS — Z510 Encounter for antineoplastic radiation therapy: Secondary | ICD-10-CM | POA: Diagnosis not present

## 2020-06-30 ENCOUNTER — Ambulatory Visit
Admission: RE | Admit: 2020-06-30 | Discharge: 2020-06-30 | Disposition: A | Discharge status: Home / Self Care | Payer: Medicare HMO | Source: Ambulatory Visit | Attending: Radiation Oncology | Admitting: Radiation Oncology

## 2020-06-30 ENCOUNTER — Other Ambulatory Visit: Payer: Self-pay | Admitting: Oncology

## 2020-06-30 ENCOUNTER — Telehealth: Payer: Self-pay | Admitting: *Deleted

## 2020-06-30 DIAGNOSIS — Z510 Encounter for antineoplastic radiation therapy: Secondary | ICD-10-CM | POA: Diagnosis not present

## 2020-06-30 DIAGNOSIS — Z5189 Encounter for other specified aftercare: Secondary | ICD-10-CM

## 2020-06-30 MED ORDER — MORPHINE SULFATE (CONCENTRATE) 10 MG /0.5 ML PO SOLN: 5.0000 mg | ORAL | 0 refills | Status: DC | PRN

## 2020-06-30 NOTE — Progress Notes (Signed)
orphine

## 2020-06-30 NOTE — Telephone Encounter (Signed)
I spoke with Joe Torres and she states that radiation therapy nurse was speaking with [name] now, She did agree to Appointment for lab Tuesday after his radiation therapy appt

## 2020-06-30 NOTE — Telephone Encounter (Signed)
I called daughter and told her that the morphine and the MMW will hopefully take care of the issue, however if it gets worse or not working let us know

## 2020-06-30 NOTE — Telephone Encounter (Signed)
Joe Torres called stating the the [place] is not helping with his mouth sores and would like something else to be ordered

## 2020-06-30 NOTE — Telephone Encounter (Signed)
Called daughter back, she had left message for [place] to see if there was any other thing to do for pt's mouth sores. Dr. Janese Banks states that between the MMW and the morphine liq. It should help. She is appreciable for the call

## 2020-06-30 NOTE — Telephone Encounter (Signed)
Joe Torres can you call him and ask if he is willing to try low dose morphien solution 5 mg q6 prn. I would also like repeat cbc with diff done next week on Tuesday. If he doesn't want morphine we can give him doxepin mouthwash

## 2020-07-03 ENCOUNTER — Other Ambulatory Visit: Payer: Self-pay | Admitting: *Deleted

## 2020-07-03 ENCOUNTER — Inpatient Hospital Stay (HOSPITAL_BASED_OUTPATIENT_CLINIC_OR_DEPARTMENT_OTHER): Payer: Medicare HMO | Admitting: Nurse Practitioner

## 2020-07-03 ENCOUNTER — Other Ambulatory Visit: Payer: Self-pay

## 2020-07-03 ENCOUNTER — Inpatient Hospital Stay: Payer: Medicare HMO

## 2020-07-03 ENCOUNTER — Ambulatory Visit
Admission: RE | Admit: 2020-07-03 | Discharge: 2020-07-03 | Disposition: A | Discharge status: Home / Self Care | Payer: Medicare HMO | Source: Ambulatory Visit | Attending: Radiation Oncology | Admitting: Radiation Oncology

## 2020-07-03 VITALS — BP 136/92 | HR 95 | Temp 97.9°F

## 2020-07-03 DIAGNOSIS — K1379 Other lesions of oral mucosa: Secondary | ICD-10-CM

## 2020-07-03 DIAGNOSIS — C678 Malignant neoplasm of overlapping sites of bladder: Secondary | ICD-10-CM

## 2020-07-03 DIAGNOSIS — K1231 Oral mucositis (ulcerative) due to antineoplastic therapy: Secondary | ICD-10-CM | POA: Diagnosis not present

## 2020-07-03 DIAGNOSIS — Z5111 Encounter for antineoplastic chemotherapy: Secondary | ICD-10-CM | POA: Diagnosis not present

## 2020-07-03 DIAGNOSIS — Z510 Encounter for antineoplastic radiation therapy: Secondary | ICD-10-CM | POA: Diagnosis not present

## 2020-07-03 LAB — CBC WITH DIFFERENTIAL/PLATELET
Abs Immature Granulocytes: 0.04 10*3/uL (ref 0.00–0.07)
Basophils Absolute: 0 10*3/uL (ref 0.0–0.1)
Basophils Relative: 0 %
Eosinophils Absolute: 0 10*3/uL (ref 0.0–0.5)
Eosinophils Relative: 0 %
HCT: 42.4 % (ref 39.0–52.0)
Hemoglobin: 14.2 g/dL (ref 13.0–17.0)
Immature Granulocytes: 1 %
Lymphocytes Relative: 5 %
Lymphs Abs: 0.4 10*3/uL — ABNORMAL LOW (ref 0.7–4.0)
MCH: 30 pg (ref 26.0–34.0)
MCHC: 33.5 g/dL (ref 30.0–36.0)
MCV: 89.5 fL (ref 80.0–100.0)
Monocytes Absolute: 0.5 10*3/uL (ref 0.1–1.0)
Monocytes Relative: 7 %
Neutro Abs: 6 10*3/uL (ref 1.7–7.7)
Neutrophils Relative %: 87 %
Platelets: 114 10*3/uL — ABNORMAL LOW (ref 150–400)
RBC: 4.74 MIL/uL (ref 4.22–5.81)
RDW: 12.4 % (ref 11.5–15.5)
WBC: 7 10*3/uL (ref 4.0–10.5)
nRBC: 0 % (ref 0.0–0.2)

## 2020-07-03 LAB — BASIC METABOLIC PANEL
Anion gap: 12 (ref 5–15)
BUN: 21 mg/dL (ref 8–23)
CO2: 29 mmol/L (ref 22–32)
Calcium: 9 mg/dL (ref 8.9–10.3)
Chloride: 98 mmol/L (ref 98–111)
Creatinine, Ser: 0.93 mg/dL (ref 0.61–1.24)
GFR, Estimated: >60 mL/min (ref >60)
Glucose, Bld: 134 mg/dL — ABNORMAL HIGH (ref 70–99)
Potassium: 3.8 mmol/L (ref 3.5–5.1)
Sodium: 139 mmol/L (ref 135–145)

## 2020-07-03 MED ORDER — OXYCODONE HCL 5 MG PO TABS: 2.5000 mg | ORAL_TABLET | ORAL | 0 refills | Status: DC | PRN

## 2020-07-03 NOTE — Progress Notes (Signed)
Symptom Management [place]  [phone] [phone]  Patient Care Team: Center, [facility] as PCP - General (General Practice)   Name of the patient: Joe Torres  [id number]  [birth year]   Date of visit: 07/03/20  Diagnosis- Bladder Cancer  Chief complaint/ Reason for visit- Mouth Sores  Heme/Onc history:  Oncology History  Malignant neoplasm of overlapping sites of bladder ([place])  06/13/2020 Cancer Staging   Staging form: Urinary Bladder, AJCC 8th Edition - Clinical stage from 06/13/2020: Stage II (cT2, cN0, cM0) - Signed by Sindy Guadeloupe, MD on 06/18/2020 WHO/ISUP grade (low/high): High Grade Histologic grading system: 2 grade system   06/18/2020 Initial Diagnosis   Malignant neoplasm of overlapping sites of bladder ([place])   06/22/2020 -  Chemotherapy    Patient is on Treatment Plan: BLADDER MITOMYCIN D1 + 5FU D1-5, 21-25 + XRT        Interval history-patient is 75 year old male currently undergoing chemotherapy and radiation for history of bladder cancer.  He presents to symptom management clinic for complaints of mouth sores.  He has been using Magic mouthwash with some improvement initially but then symptoms persisted and he was started on morphine.  Warfarin improves pain but wears off too fast.  He has been applying Chapstick to his lips regularly.  Notices crusting behind his lower lip that is most bothersome overnight when his mouth dries out.  He sips water throughout the night to try to help with his symptoms.  Otherwise feels well and feels that he is drinking sufficiently.  Denies any neurologic complaints. Denies recent fevers or illnesses. Denies any easy bleeding or bruising. Reports good appetite and denies weight loss. Denies chest pain. Denies any nausea, vomiting, constipation, or diarrhea. Denies urinary complaints. Patient offers no further specific complaints today.  Review of systems- Review of  Systems  Constitutional: Negative for chills, fever, malaise/fatigue and weight loss.  HENT: Positive for sore throat. Negative for hearing loss, nosebleeds and tinnitus.   Eyes: Negative for blurred vision and double vision.  Respiratory: Negative for cough, hemoptysis, shortness of breath and wheezing.   Cardiovascular: Negative for chest pain, palpitations and leg swelling.  Gastrointestinal: Negative for abdominal pain, blood in stool, constipation, diarrhea, melena, nausea and vomiting.  Genitourinary: Negative for dysuria and urgency.  Musculoskeletal: Negative for back pain, falls, joint pain and myalgias.  Skin: Negative for itching and rash.  Neurological: Negative for dizziness, tingling, sensory change, loss of consciousness, weakness and headaches.  Endo/Heme/Allergies: Negative for environmental allergies. Does not bruise/bleed easily.  Psychiatric/Behavioral: Negative for depression. The patient is not nervous/anxious and does not have insomnia.       Allergies  Allergen Reactions  . Shrimp [Shellfish Allergy] Other (See Comments)    Per MD comments critical    Past Medical History:  Diagnosis Date  . Arthritis   . Asthma   . Bladder cancer ([place])   . Cancer ([place])   . COPD (chronic obstructive pulmonary disease) ([place])   . Dyspnea   . GERD (gastroesophageal reflux disease)   . Hypertension     Past Surgical History:  Procedure Laterality Date  . COLONOSCOPY    . HERNIA REPAIR  2017  . PORTA CATH INSERTION N/A 06/19/2020   Procedure: PORTA CATH INSERTION;  Surgeon: Algernon Huxley, MD;  Location: [place] [facility];  Service: Cardiovascular;  Laterality: N/A;  . TRANSURETHRAL RESECTION OF BLADDER TUMOR WITH MITOMYCIN-C N/A 05/22/2020   Procedure: TRANSURETHRAL RESECTION  OF BLADDER TUMOR WITH Gemcitabine;  Surgeon: Hollice Espy, MD;  Location: [facility] ORS;  Service: Urology;  Laterality: N/A;    Social History   Socioeconomic History  . Marital status: Married     Spouse name: Not on file  . Number of children: Not on file  . Years of education: Not on file  . Highest education level: Not on file  Occupational History  . Not on file  Tobacco Use  . Smoking status: Former Smoker    Packs/day: 1.50    Years: 40.00    Pack years: 60.00    Types: Cigarettes    Quit date: 05/21/2008    Years since quitting: 12.1  . Smokeless tobacco: Never Used  Vaping Use  . Vaping Use: Never used  Substance and Sexual Activity  . Alcohol use: Not Currently  . Drug use: Never  . Sexual activity: Not on file  Other Topics Concern  . Not on file  Social History Narrative  . Not on file   Social Determinants of Health   Financial Resource Strain: Not on file  Food Insecurity: Not on file  Transportation Needs: Not on file  Physical Activity: Not on file  Stress: Not on file  Social Connections: Not on file  Intimate Partner Violence: Not on file    No family history on file.   Current Outpatient Medications:  .  acetaminophen (TYLENOL) 500 MG tablet, Take 500 mg by mouth every 8 (eight) hours as needed for moderate pain., Disp: , Rfl:  .  albuterol (PROVENTIL HFA;VENTOLIN HFA) 108 (90 Base) MCG/ACT inhaler, Inhale 2 puffs into the lungs every 4 (four) hours as needed for wheezing or shortness of breath., Disp: , Rfl:  .  albuterol (PROVENTIL) (2.5 MG/3ML) 0.083% nebulizer solution, Take 2.5 mg by nebulization every 4 (four) hours as needed for wheezing or shortness of breath., Disp: , Rfl:  .  amLODipine (NORVASC) 10 MG tablet, Take 10 mg by mouth daily., Disp: , Rfl:  .  arformoterol (BROVANA) 15 MCG/2ML NEBU, Take 15 mcg by nebulization 2 (two) times daily., Disp: , Rfl:  .  cetirizine (ZYRTEC) 10 MG tablet, Chew 10 mg by mouth daily., Disp: , Rfl:  .  diclofenac Sodium (VOLTAREN) 1 % GEL, Apply 1 application topically 2 (two) times daily as needed (pain)., Disp: , Rfl:  .  hydrochlorothiazide (HYDRODIURIL) 25 MG tablet, Take 25 mg by mouth daily.,  Disp: , Rfl:  .  lidocaine-prilocaine (EMLA) cream, Apply to affected area once, Disp: 30 g, Rfl: 3 .  LORazepam (ATIVAN) 0.5 MG tablet, Take 1 tablet (0.5 mg total) by mouth every 6 (six) hours as needed (Nausea or vomiting)., Disp: 30 tablet, Rfl: 0 .  magic mouthwash w/lidocaine SOLN, Take 5 mLs by mouth 4 (four) times daily as needed for mouth pain., Disp: 480 mL, Rfl: 1 .  Mepolizumab (NUCALA) 100 MG SOLR, Inject 100 mg into the skin every 30 (thirty) days., Disp: , Rfl:  .  montelukast (SINGULAIR) 10 MG tablet, Take 10 mg by mouth at bedtime., Disp: , Rfl:  .  Morphine Sulfate (MORPHINE CONCENTRATE) 10 mg / 0.5 ml concentrated solution, Take 0.25 mLs (5 mg total) by mouth every 4 (four) hours as needed for severe pain., Disp: 30 mL, Rfl: 0 .  pantoprazole (PROTONIX) 40 MG tablet, Take 40 mg by mouth daily., Disp: , Rfl:  .  roflumilast (DALIRESP) 500 MCG TABS tablet, Take 500 mcg by mouth daily., Disp: , Rfl:  .  simvastatin (ZOCOR) 20 MG tablet, Take 20 mg by mouth daily., Disp: , Rfl:  .  tamsulosin (FLOMAX) 0.4 MG CAPS capsule, Take 1 capsule (0.4 mg total) by mouth daily., Disp: 30 capsule, Rfl: 0 .  tiotropium (SPIRIVA) 18 MCG inhalation capsule, Place 18 mcg into inhaler and inhale daily., Disp: , Rfl:  .  traMADol (ULTRAM) 50 MG tablet, Take 50 mg by mouth every 6 (six) hours as needed for severe pain., Disp: , Rfl:  .  ondansetron (ZOFRAN) 8 MG tablet, Take 1 tablet (8 mg total) by mouth 2 (two) times daily as needed (Nausea or vomiting). (Patient not taking: Reported on 07/03/2020), Disp: 30 tablet, Rfl: 1 .  prochlorperazine (COMPAZINE) 10 MG tablet, Take 1 tablet (10 mg total) by mouth every 6 (six) hours as needed (Nausea or vomiting). (Patient not taking: Reported on 07/03/2020), Disp: 30 tablet, Rfl: 1  Current Facility-Administered Medications:  .  gemcitabine (GEMZAR) chemo syringe for bladder instillation 2,000 mg, 2,000 mg, Bladder Instillation, Once, Hollice Espy,  MD  Physical exam:  Vitals:   07/03/20 1241  BP: (!) 136/92  Pulse: 95  Temp: 97.9 F (36.6 C)  TempSrc: Tympanic  SpO2: 99%   Physical Exam Constitutional:      General: He is not in acute distress.    Appearance: He is well-developed and well-nourished.  HENT:     Head: Normocephalic and atraumatic.     Mouth/Throat:     Mouth: Oropharynx is clear and moist. Mucous membranes are dry.     Pharynx: No oropharyngeal exudate.     Comments: Crusting lesions inside lower lip. No apparent mucositis or fungal infection Eyes:     Extraocular Movements: EOM normal.  Cardiovascular:     Rate and Rhythm: Normal rate and regular rhythm.  Pulmonary:     Effort: Pulmonary effort is normal.     Breath sounds: No wheezing.     Comments: On oxygen chronically Abdominal:     General: There is no distension.     Palpations: Abdomen is soft.     Tenderness: There is no abdominal tenderness.  Musculoskeletal:        General: No edema. Normal range of motion.     Cervical back: Normal range of motion and neck supple.  Skin:    General: Skin is warm and dry.  Neurological:     Mental Status: He is alert and oriented to person, place, and time.  Psychiatric:        Mood and Affect: Mood and affect and mood normal.        Behavior: Behavior normal.      CMP Latest Ref Rng & Units 07/03/2020  Glucose 70 - 99 mg/dL 134(H)  BUN 8 - 23 mg/dL 21  Creatinine 0.61 - 1.24 mg/dL 0.93  Sodium 135 - 145 mmol/L 139  Potassium 3.5 - 5.1 mmol/L 3.8  Chloride 98 - 111 mmol/L 98  CO2 22 - 32 mmol/L 29  Calcium 8.9 - 10.3 mg/dL 9.0  Total Protein 6.5 - 8.1 g/dL -  Total Bilirubin 0.3 - 1.2 mg/dL -  Alkaline Phos 38 - 126 U/L -  AST 15 - 41 U/L -  ALT 0 - 44 U/L -   CBC Latest Ref Rng & Units 07/03/2020  WBC 4.0 - 10.5 K/uL 7.0  Hemoglobin 13.0 - 17.0 g/dL 14.2  Hematocrit 39.0 - 52.0 % 42.4  Platelets 150 - 400 K/uL 114(L)    No images are attached to the  encounter.  PERIPHERAL VASCULAR  CATHETERIZATION  Result Date: 06/19/2020 See op note   Assessment and plan- Patient is a 75 y.o. male with chemotherapy induced mucositis in various stages of healing. Crusting and dry mouth most bothersome. Provided with samples of overnight dry mouth gel which may help and dry mouth rinse. Encouraged hygiene. Provided sponge for cleaning. Can continue magic mouthwash. Will switch pain medication to oxycodone for long lasting relief. Patient declines iv fluids today. Drinking well so this is acceptable. Continue to monitor. RTC if symptoms do not improve or worsen in the interim.   Due to language barrier a spanish language interpreter participated in all patient interactions.    Visit Diagnosis 1. Mucositis due to chemotherapy     Patient expressed understanding and was in agreement with this plan. He also understands that He can call clinic at any time with any questions, concerns, or complaints.   Thank you for allowing me to participate in the care of this very pleasant patient.   I spent a total of 25 minutes reviewing chart data, face-to-face evaluation with the patient, counseling and coordination of care as detailed above.   Beckey Rutter, DNP, AGNP-[facility] [place] [facility] [place]

## 2020-07-03 NOTE — Progress Notes (Signed)
Pt still with mouth sores and his throat is sore also. The morphine works for a few min. And then it is back hurting. It hurts to drink water but he is drinking, he drinks gingerale. He is not eating very much- hard to get it down with throat and sores. I told him he can dink gatorade or powerade. We have samples of boost and ensure and he does not want any. This am his stomach hurt and he has 3 stools and some liquid and some form in each of them. I told pt that we will get labs and possible fluids if pt needs them. He states he does not need fluids and he will try more fluids at home. He just wanted someone to see his mouth and throat and recommend something to help him better if possible

## 2020-07-04 ENCOUNTER — Inpatient Hospital Stay: Payer: Medicare HMO

## 2020-07-04 ENCOUNTER — Ambulatory Visit
Admission: RE | Admit: 2020-07-04 | Discharge: 2020-07-04 | Disposition: A | Discharge status: Home / Self Care | Payer: Medicare HMO | Source: Ambulatory Visit | Attending: Radiation Oncology | Admitting: Radiation Oncology

## 2020-07-04 DIAGNOSIS — Z510 Encounter for antineoplastic radiation therapy: Secondary | ICD-10-CM | POA: Diagnosis not present

## 2020-07-05 ENCOUNTER — Ambulatory Visit
Admission: RE | Admit: 2020-07-05 | Discharge: 2020-07-05 | Disposition: A | Discharge status: Home / Self Care | Payer: Medicare HMO | Source: Ambulatory Visit | Attending: Radiation Oncology | Admitting: Radiation Oncology

## 2020-07-05 DIAGNOSIS — Z510 Encounter for antineoplastic radiation therapy: Secondary | ICD-10-CM | POA: Diagnosis not present

## 2020-07-06 ENCOUNTER — Ambulatory Visit
Admission: RE | Admit: 2020-07-06 | Discharge: 2020-07-06 | Disposition: A | Discharge status: Home / Self Care | Payer: Medicare HMO | Source: Ambulatory Visit | Attending: Radiation Oncology | Admitting: Radiation Oncology

## 2020-07-06 DIAGNOSIS — Z510 Encounter for antineoplastic radiation therapy: Secondary | ICD-10-CM | POA: Diagnosis not present

## 2020-07-07 ENCOUNTER — Ambulatory Visit
Admission: RE | Admit: 2020-07-07 | Discharge: 2020-07-07 | Disposition: A | Discharge status: Home / Self Care | Payer: Medicare HMO | Source: Ambulatory Visit | Attending: Radiation Oncology | Admitting: Radiation Oncology

## 2020-07-07 DIAGNOSIS — Z510 Encounter for antineoplastic radiation therapy: Secondary | ICD-10-CM | POA: Diagnosis not present

## 2020-07-10 ENCOUNTER — Ambulatory Visit
Admission: RE | Admit: 2020-07-10 | Discharge: 2020-07-10 | Disposition: A | Discharge status: Home / Self Care | Payer: Medicare HMO | Source: Ambulatory Visit | Attending: Radiation Oncology | Admitting: Radiation Oncology

## 2020-07-10 DIAGNOSIS — Z510 Encounter for antineoplastic radiation therapy: Secondary | ICD-10-CM | POA: Diagnosis not present

## 2020-07-11 ENCOUNTER — Ambulatory Visit
Admission: RE | Admit: 2020-07-11 | Discharge: 2020-07-11 | Disposition: A | Discharge status: Home / Self Care | Payer: Medicare HMO | Source: Ambulatory Visit | Attending: Radiation Oncology | Admitting: Radiation Oncology

## 2020-07-11 DIAGNOSIS — C678 Malignant neoplasm of overlapping sites of bladder: Secondary | ICD-10-CM | POA: Insufficient documentation

## 2020-07-11 DIAGNOSIS — Z510 Encounter for antineoplastic radiation therapy: Secondary | ICD-10-CM | POA: Insufficient documentation

## 2020-07-11 HISTORY — PX: TRANSURETHRAL RESECTION OF BLADDER TUMOR WITH GYRUS (TURBT-GYRUS): SHX6458

## 2020-07-12 ENCOUNTER — Ambulatory Visit
Admission: RE | Admit: 2020-07-12 | Discharge: 2020-07-12 | Disposition: A | Discharge status: Home / Self Care | Payer: Medicare HMO | Source: Ambulatory Visit | Attending: Radiation Oncology | Admitting: Radiation Oncology

## 2020-07-12 DIAGNOSIS — Z510 Encounter for antineoplastic radiation therapy: Secondary | ICD-10-CM | POA: Diagnosis not present

## 2020-07-13 ENCOUNTER — Ambulatory Visit
Admission: RE | Admit: 2020-07-13 | Discharge: 2020-07-13 | Disposition: A | Discharge status: Home / Self Care | Payer: Medicare HMO | Source: Ambulatory Visit | Attending: Radiation Oncology | Admitting: Radiation Oncology

## 2020-07-13 DIAGNOSIS — Z510 Encounter for antineoplastic radiation therapy: Secondary | ICD-10-CM | POA: Diagnosis not present

## 2020-07-14 ENCOUNTER — Ambulatory Visit
Admission: RE | Admit: 2020-07-14 | Discharge: 2020-07-14 | Disposition: A | Discharge status: Home / Self Care | Payer: Medicare HMO | Source: Ambulatory Visit | Attending: Radiation Oncology | Admitting: Radiation Oncology

## 2020-07-14 DIAGNOSIS — Z510 Encounter for antineoplastic radiation therapy: Secondary | ICD-10-CM | POA: Diagnosis not present

## 2020-07-17 ENCOUNTER — Ambulatory Visit
Admission: RE | Admit: 2020-07-17 | Discharge: 2020-07-17 | Disposition: A | Discharge status: Home / Self Care | Payer: Medicare HMO | Source: Ambulatory Visit | Attending: Radiation Oncology | Admitting: Radiation Oncology

## 2020-07-17 DIAGNOSIS — Z510 Encounter for antineoplastic radiation therapy: Secondary | ICD-10-CM | POA: Diagnosis not present

## 2020-07-18 ENCOUNTER — Inpatient Hospital Stay: Payer: Medicare HMO

## 2020-07-18 ENCOUNTER — Ambulatory Visit
Admission: RE | Admit: 2020-07-18 | Discharge: 2020-07-18 | Disposition: A | Discharge status: Home / Self Care | Payer: Medicare HMO | Source: Ambulatory Visit | Attending: Radiation Oncology | Admitting: Radiation Oncology

## 2020-07-18 ENCOUNTER — Encounter: Payer: Self-pay | Admitting: Oncology

## 2020-07-18 ENCOUNTER — Inpatient Hospital Stay: Payer: Medicare HMO | Attending: Oncology

## 2020-07-18 ENCOUNTER — Telehealth: Payer: Self-pay | Admitting: Oncology

## 2020-07-18 ENCOUNTER — Inpatient Hospital Stay (HOSPITAL_BASED_OUTPATIENT_CLINIC_OR_DEPARTMENT_OTHER): Payer: Medicare HMO | Admitting: Nurse Practitioner

## 2020-07-18 VITALS — BP 118/68 | HR 92 | Temp 98.8°F | Resp 18 | Wt 147.9 lb

## 2020-07-18 DIAGNOSIS — Z79899 Other long term (current) drug therapy: Secondary | ICD-10-CM | POA: Insufficient documentation

## 2020-07-18 DIAGNOSIS — I10 Essential (primary) hypertension: Secondary | ICD-10-CM | POA: Insufficient documentation

## 2020-07-18 DIAGNOSIS — Z5111 Encounter for antineoplastic chemotherapy: Secondary | ICD-10-CM | POA: Insufficient documentation

## 2020-07-18 DIAGNOSIS — C679 Malignant neoplasm of bladder, unspecified: Secondary | ICD-10-CM | POA: Insufficient documentation

## 2020-07-18 DIAGNOSIS — T451X5A Adverse effect of antineoplastic and immunosuppressive drugs, initial encounter: Secondary | ICD-10-CM | POA: Insufficient documentation

## 2020-07-18 DIAGNOSIS — K1231 Oral mucositis (ulcerative) due to antineoplastic therapy: Secondary | ICD-10-CM | POA: Insufficient documentation

## 2020-07-18 DIAGNOSIS — C678 Malignant neoplasm of overlapping sites of bladder: Secondary | ICD-10-CM

## 2020-07-18 DIAGNOSIS — K219 Gastro-esophageal reflux disease without esophagitis: Secondary | ICD-10-CM | POA: Diagnosis not present

## 2020-07-18 DIAGNOSIS — Z510 Encounter for antineoplastic radiation therapy: Secondary | ICD-10-CM | POA: Diagnosis not present

## 2020-07-18 DIAGNOSIS — J449 Chronic obstructive pulmonary disease, unspecified: Secondary | ICD-10-CM | POA: Diagnosis not present

## 2020-07-18 DIAGNOSIS — Z87891 Personal history of nicotine dependence: Secondary | ICD-10-CM | POA: Diagnosis not present

## 2020-07-18 DIAGNOSIS — G893 Neoplasm related pain (acute) (chronic): Secondary | ICD-10-CM | POA: Diagnosis not present

## 2020-07-18 DIAGNOSIS — R7989 Other specified abnormal findings of blood chemistry: Secondary | ICD-10-CM | POA: Insufficient documentation

## 2020-07-18 DIAGNOSIS — Z9981 Dependence on supplemental oxygen: Secondary | ICD-10-CM | POA: Insufficient documentation

## 2020-07-18 DIAGNOSIS — E876 Hypokalemia: Secondary | ICD-10-CM | POA: Insufficient documentation

## 2020-07-18 LAB — COMPREHENSIVE METABOLIC PANEL
ALT: 46 U/L — ABNORMAL HIGH (ref 0–44)
AST: 33 U/L (ref 15–41)
Albumin: 3.2 g/dL — ABNORMAL LOW (ref 3.5–5.0)
Alkaline Phosphatase: 83 U/L (ref 38–126)
Anion gap: 12 (ref 5–15)
BUN: 11 mg/dL (ref 8–23)
CO2: 28 mmol/L (ref 22–32)
Calcium: 8.6 mg/dL — ABNORMAL LOW (ref 8.9–10.3)
Chloride: 98 mmol/L (ref 98–111)
Creatinine, Ser: 0.71 mg/dL (ref 0.61–1.24)
GFR, Estimated: >60 mL/min (ref >60)
Glucose, Bld: 129 mg/dL — ABNORMAL HIGH (ref 70–99)
Potassium: 3.4 mmol/L — ABNORMAL LOW (ref 3.5–5.1)
Sodium: 138 mmol/L (ref 135–145)
Total Bilirubin: 0.6 mg/dL (ref 0.3–1.2)
Total Protein: 6.9 g/dL (ref 6.5–8.1)

## 2020-07-18 LAB — CBC WITH DIFFERENTIAL/PLATELET
Abs Immature Granulocytes: 0.1 10*3/uL — ABNORMAL HIGH (ref 0.00–0.07)
Basophils Absolute: 0 10*3/uL (ref 0.0–0.1)
Basophils Relative: 0 %
Eosinophils Absolute: 0 10*3/uL (ref 0.0–0.5)
Eosinophils Relative: 0 %
HCT: 35 % — ABNORMAL LOW (ref 39.0–52.0)
Hemoglobin: 11.7 g/dL — ABNORMAL LOW (ref 13.0–17.0)
Immature Granulocytes: 2 %
Lymphocytes Relative: 3 %
Lymphs Abs: 0.2 10*3/uL — ABNORMAL LOW (ref 0.7–4.0)
MCH: 30 pg (ref 26.0–34.0)
MCHC: 33.4 g/dL (ref 30.0–36.0)
MCV: 89.7 fL (ref 80.0–100.0)
Monocytes Absolute: 0.5 10*3/uL (ref 0.1–1.0)
Monocytes Relative: 8 %
Neutro Abs: 5.8 10*3/uL (ref 1.7–7.7)
Neutrophils Relative %: 87 %
Platelets: 163 10*3/uL (ref 150–400)
RBC: 3.9 MIL/uL — ABNORMAL LOW (ref 4.22–5.81)
RDW: 14.5 % (ref 11.5–15.5)
WBC: 6.6 10*3/uL (ref 4.0–10.5)
nRBC: 0 % (ref 0.0–0.2)

## 2020-07-18 MED ORDER — POTASSIUM CHLORIDE ER 10 MEQ PO TBCR
10.0000 meq | EXTENDED_RELEASE_TABLET | Freq: Every day | ORAL | 0 refills | Status: DC
Start: 2020-07-18 — End: 2020-10-23

## 2020-07-18 MED ORDER — SODIUM CHLORIDE 0.9 % IV SOLN
4500.0000 mg | INTRAVENOUS | Status: DC
  Administered 2020-07-18: 4500 mg via INTRAVENOUS
  Filled 2020-07-18: qty 90

## 2020-07-18 MED ORDER — HEPARIN SOD (PORK) LOCK FLUSH 100 UNIT/ML IV SOLN
500.0000 [IU] | Freq: Once | INTRAVENOUS | Status: DC
  Filled 2020-07-18: qty 5

## 2020-07-18 MED ORDER — SODIUM CHLORIDE 0.9% FLUSH
10.0000 mL | INTRAVENOUS | Status: DC | PRN
  Administered 2020-07-18: 10 mL via INTRAVENOUS
  Filled 2020-07-18: qty 10

## 2020-07-18 NOTE — Telephone Encounter (Signed)
Left message with patient reviewing changes to appointment times on 3/14. Per Dr. Grayland Ormond, move patient's PUMP D/C appointment from 3/15 to 3/14. Per Infusion Charge RN move appointment with MD to 3/14 also. Mailing updated AVS.

## 2020-07-18 NOTE — Progress Notes (Signed)
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  History obtained with the help of Spanish interpreter.  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  He presented with symptoms of gross hematuria and underwent CT urogram which showed 2 lesions in the bladder 1 measuring 2.1 x 2.1 cm in the posterior bladder wall as well as an eccentric mass just left of the midline measuring 1.6 x 1.1 cm.  Patient was seen by urology and underwent cystoscopy.  Visualization was limited due to gross hematuria.  Large at least 2 cm spherical high-grade appearing tumor in the midline posterior bladder wall.  This was followed by TURBT which showed a 2.5 cm posterior bladder tumor appearing necrotic/hemorrhagic another 2 cm nodular area which had a submucosal solid infiltrative appearance.  Significant bladder varices and hypervascularity surrounding tumors.  Pathology showed invasive urothelial carcinoma high-grade with high suspicion for muscularis propria involvement.  Scattered small fragments of muscularis propria identified an invasive tumor was seen adjacent to these muscle bundles.  However unequivocal muscle invasion was not readily apparent.  Patient could not urinate following his TURBT and required temporary placement of Foley catheter but was able to pass his trial of void later.  Risk of repeat TURBT was discussed and mutual consensus was to avoid another invasive procedure given his oxygen dependent COPD.  Case discussed  at tumor board.  He was not deemed to be a good candidate for cystoprostatectomy.  Based on the appearance of the tumor at the time of TURBT as well as pathology, it was deemed that there was a high risk of muscle invasion.   Patient receiving concurrent chemoradiation with 5-FU mitomycin.  Day 1 given on 06/20/2020 along with 1 dose of mitomycin  Interval history- Has had mouthsores and was seen in Symptom Management. Opted for magic mouthwash and pain medication. Mouthsores have resolved. No pain. No significant nausea or vomiting. Takes benadryl for itching after he has treatment. Daughter concerned of low appetite and weight loss. Drinks gatorade.   ECOG PS- 2 Pain scale- 0   Review of systems- Review of Systems  Constitutional: Positive for malaise/fatigue. Negative for chills, fever and weight loss.  HENT: Negative for congestion, ear discharge and nosebleeds.   Eyes: Negative for blurred vision.  Respiratory: Negative for cough, hemoptysis, sputum production, shortness of breath and wheezing.   Cardiovascular: Negative for chest pain, palpitations, orthopnea and claudication.  Gastrointestinal: Negative for abdominal pain, blood in stool, constipation, diarrhea, heartburn, melena, nausea and vomiting.  Genitourinary: Negative for dysuria, flank pain, frequency, hematuria and urgency.  Musculoskeletal: Negative for back pain, joint pain and myalgias.  Skin: Negative for rash.  Neurological: Negative for dizziness, tingling, focal weakness, seizures, weakness and headaches.  Endo/Heme/Allergies: Does not bruise/bleed easily.  Psychiatric/Behavioral: Negative for depression and suicidal ideas. The patient does not have insomnia.      Allergies  Allergen Reactions   Shrimp [Shellfish Allergy] Other (See Comments)    Per MD comments critical    Past Medical History:  Diagnosis Date   Arthritis  Asthma    Bladder cancer ([place])    Cancer ([place])    COPD (chronic obstructive  pulmonary disease) (HCC)    Dyspnea    GERD (gastroesophageal reflux disease)    Hypertension     Past Surgical History:  Procedure Laterality Date   COLONOSCOPY     HERNIA REPAIR  2017   PORTA CATH INSERTION N/A 06/19/2020   Procedure: PORTA CATH INSERTION;  Surgeon: Algernon Huxley, MD;  Location: [place] [facility];  Service: Cardiovascular;  Laterality: N/A;   TRANSURETHRAL RESECTION OF BLADDER TUMOR WITH MITOMYCIN-C N/A 05/22/2020   Procedure: TRANSURETHRAL RESECTION OF BLADDER TUMOR WITH Gemcitabine;  Surgeon: Hollice Espy, MD;  Location: [facility];  Service: Urology;  Laterality: N/A;    Social History   Socioeconomic History   Marital status: Married    Spouse name: Not on file   Number of children: Not on file   Years of education: Not on file   Highest education level: Not on file  Occupational History   Not on file  Tobacco Use   Smoking status: Former Smoker    Packs/day: 1.50    Years: 40.00    Pack years: 60.00    Types: Cigarettes    Quit date: 05/21/2008    Years since quitting: 12.1   Smokeless tobacco: Never Used  Vaping Use   Vaping Use: Never used  Substance and Sexual Activity   Alcohol use: Not Currently   Drug use: Never   Sexual activity: Not on file  Other Topics Concern   Not on file  Social History Narrative   Not on file   Social Determinants of Health   Financial Resource Strain: Not on file  Food Insecurity: Not on file  Transportation Needs: Not on file  Physical Activity: Not on file  Stress: Not on file  Social Connections: Not on file  Intimate Partner Violence: Not on file    No family history on file.   Current Outpatient Medications:    acetaminophen (TYLENOL) 500 MG tablet, Take 500 mg by mouth every 8 (eight) hours as needed for moderate pain., Disp: , Rfl:    albuterol (PROVENTIL HFA;VENTOLIN HFA) 108 (90 Base) MCG/ACT inhaler, Inhale 2 puffs into the lungs every 4 (four) hours as needed for  wheezing or shortness of breath., Disp: , Rfl:    albuterol (PROVENTIL) (2.5 MG/3ML) 0.083% nebulizer solution, Take 2.5 mg by nebulization every 4 (four) hours as needed for wheezing or shortness of breath., Disp: , Rfl:    amLODipine (NORVASC) 10 MG tablet, Take 10 mg by mouth daily., Disp: , Rfl:    arformoterol (BROVANA) 15 MCG/2ML NEBU, Take 15 mcg by nebulization 2 (two) times daily., Disp: , Rfl:    cetirizine (ZYRTEC) 10 MG tablet, Chew 10 mg by mouth daily., Disp: , Rfl:    diclofenac Sodium (VOLTAREN) 1 % GEL, Apply 1 application topically 2 (two) times daily as needed (pain)., Disp: , Rfl:    hydrochlorothiazide (HYDRODIURIL) 25 MG tablet, Take 25 mg by mouth daily., Disp: , Rfl:    lidocaine-prilocaine (EMLA) cream, Apply to affected area once, Disp: 30 g, Rfl: 3   LORazepam (ATIVAN) 0.5 MG tablet, Take 1 tablet (0.5 mg total) by mouth every 6 (six) hours as needed (Nausea or vomiting)., Disp: 30 tablet, Rfl: 0   magic mouthwash w/lidocaine SOLN, Take 5 mLs by mouth 4 (four) times daily as needed for mouth pain., Disp: 480 mL, Rfl: 1   Mepolizumab (NUCALA) 100  MG SOLR, Inject 100 mg into the skin every 30 (thirty) days., Disp: , Rfl:    montelukast (SINGULAIR) 10 MG tablet, Take 10 mg by mouth at bedtime., Disp: , Rfl:    Morphine Sulfate (MORPHINE CONCENTRATE) 10 mg / 0.5 ml concentrated solution, Take 0.25 mLs (5 mg total) by mouth every 4 (four) hours as needed for severe pain., Disp: 30 mL, Rfl: 0   ondansetron (ZOFRAN) 8 MG tablet, Take 1 tablet (8 mg total) by mouth 2 (two) times daily as needed (Nausea or vomiting). (Patient not taking: Reported on 07/03/2020), Disp: 30 tablet, Rfl: 1   oxyCODONE (OXY IR/ROXICODONE) 5 MG immediate release tablet, Take 0.5-1 tablets (2.5-5 mg total) by mouth every 4 (four) hours as needed for severe pain., Disp: 30 tablet, Rfl: 0   pantoprazole (PROTONIX) 40 MG tablet, Take 40 mg by mouth daily., Disp: , Rfl:    prochlorperazine  (COMPAZINE) 10 MG tablet, Take 1 tablet (10 mg total) by mouth every 6 (six) hours as needed (Nausea or vomiting). (Patient not taking: Reported on 07/03/2020), Disp: 30 tablet, Rfl: 1   roflumilast (DALIRESP) 500 MCG TABS tablet, Take 500 mcg by mouth daily., Disp: , Rfl:    simvastatin (ZOCOR) 20 MG tablet, Take 20 mg by mouth daily., Disp: , Rfl:    tamsulosin (FLOMAX) 0.4 MG CAPS capsule, Take 1 capsule (0.4 mg total) by mouth daily., Disp: 30 capsule, Rfl: 0   tiotropium (SPIRIVA) 18 MCG inhalation capsule, Place 18 mcg into inhaler and inhale daily., Disp: , Rfl:   Current Facility-Administered Medications:    gemcitabine (GEMZAR) chemo syringe for bladder instillation 2,000 mg, 2,000 mg, Bladder Instillation, Once, Hollice Espy, MD  Facility-Administered Medications Ordered in Other Visits:    heparin lock flush 100 unit/mL, 500 Units, Intravenous, Once, Sindy Guadeloupe, MD   sodium chloride flush (NS) 0.9 % injection 10 mL, 10 mL, Intravenous, PRN, Sindy Guadeloupe, MD, 10 mL at 07/18/20 0854  Physical exam:  Vitals:   07/18/20 0913  BP: 118/68  Pulse: 92  Resp: 18  Temp: 98.8 F (37.1 C)  TempSrc: Tympanic  SpO2: 98%  Weight: 147 lb 14 oz (67.1 kg)   Physical Exam Constitutional:      Comments: On home oxygen. Accompanied by daughter  HENT:     Mouth/Throat:     Mouth: Mucous membranes are moist.     Pharynx: Oropharynx is clear.     Comments: Mucositis has resolved Cardiovascular:     Rate and Rhythm: Normal rate and regular rhythm.     Heart sounds: Normal heart sounds.  Abdominal:     General: There is no distension.     Palpations: Abdomen is soft.     Tenderness: There is no abdominal tenderness.  Skin:    General: Skin is warm and dry.  Neurological:     Mental Status: He is alert and oriented to person, place, and time.  Psychiatric:        Mood and Affect: Mood normal.        Behavior: Behavior normal.      CMP Latest Ref Rng & Units 07/03/2020   Glucose 70 - 99 mg/dL 134(H)  BUN 8 - 23 mg/dL 21  Creatinine 0.61 - 1.24 mg/dL 0.93  Sodium 135 - 145 mmol/L 139  Potassium 3.5 - 5.1 mmol/L 3.8  Chloride 98 - 111 mmol/L 98  CO2 22 - 32 mmol/L 29  Calcium 8.9 - 10.3 mg/dL 9.0  Total Protein  6.5 - 8.1 g/dL -  Total Bilirubin 0.3 - 1.2 mg/dL -  Alkaline Phos 38 - 126 U/L -  AST 15 - 41 U/L -  ALT 0 - 44 U/L -   CBC Latest Ref Rng & Units 07/03/2020  WBC 4.0 - 10.5 K/uL 7.0  Hemoglobin 13.0 - 17.0 g/dL 14.2  Hematocrit 39.0 - 52.0 % 42.4  Platelets 150 - 400 K/uL 114(L)    No images are attached to the encounter.  PERIPHERAL VASCULAR CATHETERIZATION  Result Date: 06/19/2020 See op note    Assessment and plan- Patient is a 75 y.o. 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   Patient started radiation treatment on 06/20/2020 and received this 5-FU chemotherapy on day 1 which he gets continuously for 5 days with pump disconnect on day 5. Mitomycin on day 1. Counts today reviewed and acceptable to proceed with day 21 of 5-FU. Follow up with Dr. Janese Banks on 3/14 for evaluation and pump disconnect.   Continue as needed Benadryl for itching.  No overt skin rash noted today  Continue Magic mouthwash and pain medication for throat discomfort.   ALT slightly elevated today. Continue to monitor  Hypokalemia- potassium 3.4. Start KCL 10 meq daily.   Weight loss- samples of ensure and ensure clear provided today. Refer to Jennet Maduro for monitoring of weight and trends.   Due to language barrier, a spanish language interpreter was present and participated in all patient interactions.    Visit Diagnosis 1. Encounter for antineoplastic chemotherapy   2. Malignant neoplasm of overlapping sites of bladder ([place])   3. Mucositis due to chemotherapy     Beckey Rutter, [place], AGNP-C [place] at [facility] [phone] (clinic)

## 2020-07-18 NOTE — Progress Notes (Signed)
Pt received 5FU pump placement today. No complaints at d/c.

## 2020-07-18 NOTE — Progress Notes (Signed)
Pump is ordered for 5 days but d/c pump appt is in 7 days.  [name] off.  Per Dr Woodfin Ganja will give pump over 6 days with d/c pump appt moved to Monday.

## 2020-07-19 ENCOUNTER — Ambulatory Visit
Admission: RE | Admit: 2020-07-19 | Discharge: 2020-07-19 | Disposition: A | Discharge status: Home / Self Care | Payer: Medicare HMO | Source: Ambulatory Visit | Attending: Radiation Oncology | Admitting: Radiation Oncology

## 2020-07-19 DIAGNOSIS — Z510 Encounter for antineoplastic radiation therapy: Secondary | ICD-10-CM | POA: Diagnosis not present

## 2020-07-20 ENCOUNTER — Ambulatory Visit
Admission: RE | Admit: 2020-07-20 | Discharge: 2020-07-20 | Disposition: A | Discharge status: Home / Self Care | Payer: Medicare HMO | Source: Ambulatory Visit | Attending: Radiation Oncology | Admitting: Radiation Oncology

## 2020-07-20 DIAGNOSIS — Z510 Encounter for antineoplastic radiation therapy: Secondary | ICD-10-CM | POA: Diagnosis not present

## 2020-07-21 ENCOUNTER — Ambulatory Visit
Admission: RE | Admit: 2020-07-21 | Discharge: 2020-07-21 | Disposition: A | Discharge status: Home / Self Care | Payer: Medicare HMO | Source: Ambulatory Visit | Attending: Radiation Oncology | Admitting: Radiation Oncology

## 2020-07-21 DIAGNOSIS — Z510 Encounter for antineoplastic radiation therapy: Secondary | ICD-10-CM | POA: Diagnosis not present

## 2020-07-24 ENCOUNTER — Ambulatory Visit
Admission: RE | Admit: 2020-07-24 | Discharge: 2020-07-24 | Disposition: A | Discharge status: Home / Self Care | Payer: Medicare HMO | Source: Ambulatory Visit | Attending: Radiation Oncology | Admitting: Radiation Oncology

## 2020-07-24 ENCOUNTER — Encounter: Payer: Self-pay | Admitting: Oncology

## 2020-07-24 ENCOUNTER — Other Ambulatory Visit: Payer: Self-pay | Admitting: Oncology

## 2020-07-24 ENCOUNTER — Inpatient Hospital Stay: Payer: Medicare HMO

## 2020-07-24 ENCOUNTER — Inpatient Hospital Stay (HOSPITAL_BASED_OUTPATIENT_CLINIC_OR_DEPARTMENT_OTHER): Payer: Medicare HMO | Admitting: Oncology

## 2020-07-24 VITALS — BP 136/81 | HR 97 | Temp 98.1°F | Resp 16 | Ht 62.0 in | Wt 143.5 lb

## 2020-07-24 DIAGNOSIS — C678 Malignant neoplasm of overlapping sites of bladder: Secondary | ICD-10-CM

## 2020-07-24 DIAGNOSIS — Z510 Encounter for antineoplastic radiation therapy: Secondary | ICD-10-CM | POA: Diagnosis not present

## 2020-07-24 DIAGNOSIS — Z5111 Encounter for antineoplastic chemotherapy: Secondary | ICD-10-CM | POA: Diagnosis not present

## 2020-07-24 MED ORDER — HEPARIN SOD (PORK) LOCK FLUSH 100 UNIT/ML IV SOLN
500.0000 [IU] | Freq: Once | INTRAVENOUS | Status: AC | PRN
  Administered 2020-07-24: 500 [IU]
  Filled 2020-07-24: qty 5

## 2020-07-24 MED ORDER — HEPARIN SOD (PORK) LOCK FLUSH 100 UNIT/ML IV SOLN
INTRAVENOUS | Status: AC
  Filled 2020-07-24: qty 5

## 2020-07-24 MED ORDER — SODIUM CHLORIDE 0.9% FLUSH
10.0000 mL | INTRAVENOUS | Status: DC | PRN
  Administered 2020-07-24: 10 mL
  Filled 2020-07-24: qty 10

## 2020-07-24 NOTE — Progress Notes (Signed)
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 He presented with symptoms of gross hematuria and underwent CT urogram which showed 2 lesions in the bladder 1 measuring 2.1 x 2.1 cm in the posterior bladder wall as well as an eccentric mass just left of the midline measuring 1.6 x 1.1 cm. Patient was seen by urology and underwent cystoscopy. Visualization was limited due to gross hematuria. Large at least 2 cm spherical high-grade appearing tumor in the midline posterior bladder wall. This was followed by TURBT which showed a 2.5 cm posterior bladder tumor appearing necrotic/hemorrhagic another 2 cm nodular area which had a submucosal solid infiltrative appearance. Significant bladder varices and hypervascularity surrounding tumors. Pathology showed invasive urothelial carcinoma high-grade with high suspicion for muscularis propria involvement. Scattered small fragments of muscularis propria identified an invasive tumor was seen adjacent to these muscle bundles. However unequivocal muscle invasion was not readily apparent. Patient could not urinate following his TURBT and required temporary placement of Foley catheter but was able to pass his trial of void later. Risk of repeat TURBT was discussed and mutual consensus was to avoid another invasive procedure given his oxygen dependent COPD. Case  discussed at tumor board. He was not deemed to be a good candidate for cystoprostatectomy. Based on the appearance of the tumor at the time of TURBT as well as pathology, it was deemed that there was a high risk of muscle invasion.   Patient receiving concurrent chemoradiation with 5-FU mitomycin.  Day 1 given on 06/20/2020 along with 1 dose of mitomycin  Interval history-patient is here with his daughter today.  He reports pain in his bilateral feet as well as in his left calf.  This has been ongoing for the last week to 10 days.  Reported he has not had this kind of pain before.  Mucositis and mouth pain has resolved.  He is not using morphine.  ECOG PS- 2 Pain scale- 3   Review of systems- Review of Systems  Constitutional: Positive for malaise/fatigue. Negative for chills, fever and weight loss.  HENT: Negative for congestion, ear discharge and nosebleeds.   Eyes: Negative for blurred vision.  Respiratory: Negative for cough, hemoptysis, sputum production, shortness of breath and wheezing.   Cardiovascular: Negative for chest pain, palpitations, orthopnea and claudication.  Gastrointestinal: Negative for abdominal pain, blood in stool, constipation, diarrhea, heartburn, melena, nausea and vomiting.  Genitourinary: Negative for dysuria, flank pain, frequency, hematuria and urgency.  Musculoskeletal: Negative for back pain, joint pain and myalgias.  Skin: Negative for rash.  Neurological: Positive for sensory change (Peripheral neuropathy). Negative for dizziness, tingling, focal weakness, seizures, weakness and headaches.  Endo/Heme/Allergies: Does not bruise/bleed easily.  Psychiatric/Behavioral: Negative for depression and suicidal ideas. The patient does not have insomnia.       Allergies  Allergen Reactions  . Shrimp [Shellfish Allergy] Other (See Comments)    Per MD comments critical     Past Medical History:  Diagnosis  Date  . Arthritis   . Asthma   . Bladder cancer  ([place])   . Cancer ([place])   . COPD (chronic obstructive pulmonary disease) ([place])   . Dyspnea   . GERD (gastroesophageal reflux disease)   . Hypertension      Past Surgical History:  Procedure Laterality Date  . COLONOSCOPY    . HERNIA REPAIR  2017  . PORTA CATH INSERTION N/A 06/19/2020   Procedure: PORTA CATH INSERTION;  Surgeon: Algernon Huxley, MD;  Location: [place];  Service: Cardiovascular;  Laterality: N/A;  . TRANSURETHRAL RESECTION OF BLADDER TUMOR WITH MITOMYCIN-C N/A 05/22/2020   Procedure: TRANSURETHRAL RESECTION OF BLADDER TUMOR WITH Gemcitabine;  Surgeon: Hollice Espy, MD;  Location: [facility];  Service: Urology;  Laterality: N/A;    Social History   Socioeconomic History  . Marital status: Married    Spouse name: Not on file  . Number of children: Not on file  . Years of education: Not on file  . Highest education level: Not on file  Occupational History  . Not on file  Tobacco Use  . Smoking status: Former Smoker    Packs/day: 1.50    Years: 40.00    Pack years: 60.00    Types: Cigarettes    Quit date: 05/21/2008    Years since quitting: 12.1  . Smokeless tobacco: Never Used  Vaping Use  . Vaping Use: Never used  Substance and Sexual Activity  . Alcohol use: Not Currently  . Drug use: Never  . Sexual activity: Not Currently  Other Topics Concern  . Not on file  Social History Narrative  . Not on file   Social Determinants of Health   Financial Resource Strain: Not on file  Food Insecurity: Not on file  Transportation Needs: Not on file  Physical Activity: Not on file  Stress: Not on file  Social Connections: Not on file  Intimate Partner Violence: Not on file    History reviewed. No pertinent family history.   Current Outpatient Medications:  .  acetaminophen (TYLENOL) 500 MG tablet, Take 500 mg by mouth every 8 (eight) hours as needed for moderate pain., Disp: , Rfl:  .  albuterol (PROVENTIL HFA;VENTOLIN HFA) 108 (90 Base) MCG/ACT  inhaler, Inhale 2 puffs into the lungs every 4 (four) hours as needed for wheezing or shortness of breath., Disp: , Rfl:  .  albuterol (PROVENTIL) (2.5 MG/3ML) 0.083% nebulizer solution, Take 2.5 mg by nebulization every 4 (four) hours as needed for wheezing or shortness of breath., Disp: , Rfl:  .  amLODipine (NORVASC) 10 MG tablet, Take 10 mg by mouth daily., Disp: , Rfl:  .  arformoterol (BROVANA) 15 MCG/2ML NEBU, Take 15 mcg by nebulization 2 (two) times daily., Disp: , Rfl:  .  budesonide (PULMICORT) 0.5 MG/2ML nebulizer solution, Take 0.5 mg by nebulization 2 (two) times daily., Disp: , Rfl:  .  cetirizine (ZYRTEC) 10 MG tablet, Chew 10 mg by mouth daily., Disp: , Rfl:  .  diclofenac Sodium (VOLTAREN) 1 % GEL, Apply 1 application topically 2 (two) times daily as needed (pain)., Disp: , Rfl:  .  fluticasone (FLONASE) 50 MCG/ACT nasal spray, Place 1 spray into both nostrils daily., Disp: , Rfl:  .  hydrochlorothiazide (HYDRODIURIL) 25 MG tablet, Take 25 mg by mouth daily., Disp: , Rfl:  .  lidocaine-prilocaine (EMLA) cream, Apply to affected area once, Disp: 30 g, Rfl: 3 .  LORazepam (ATIVAN) 0.5 MG tablet, Take 1 tablet (0.5  mg total) by mouth every 6 (six) hours as needed (Nausea or vomiting)., Disp: 30 tablet, Rfl: 0 .  magic mouthwash w/lidocaine SOLN, Take 5 mLs by mouth 4 (four) times daily as needed for mouth pain., Disp: 480 mL, Rfl: 1 .  Mepolizumab (NUCALA) 100 MG SOLR, Inject 100 mg into the skin every 30 (thirty) days., Disp: , Rfl:  .  montelukast (SINGULAIR) 10 MG tablet, Take 10 mg by mouth at bedtime., Disp: , Rfl:  .  pantoprazole (PROTONIX) 40 MG tablet, Take 40 mg by mouth daily., Disp: , Rfl:  .  roflumilast (DALIRESP) 500 MCG TABS tablet, Take 500 mcg by mouth daily., Disp: , Rfl:  .  simvastatin (ZOCOR) 20 MG tablet, Take 20 mg by mouth daily., Disp: , Rfl:  .  tamsulosin (FLOMAX) 0.4 MG CAPS capsule, Take 1 capsule (0.4 mg total) by mouth daily., Disp: 30 capsule, Rfl:  0 .  tiotropium (SPIRIVA) 18 MCG inhalation capsule, Place 18 mcg into inhaler and inhale daily., Disp: , Rfl:  .  traMADol (ULTRAM) 50 MG tablet, Take 50 mg by mouth 2 (two) times daily., Disp: , Rfl:  .  Morphine Sulfate (MORPHINE CONCENTRATE) 10 mg / 0.5 ml concentrated solution, Take 0.25 mLs (5 mg total) by mouth every 4 (four) hours as needed for severe pain. (Patient not taking: Reported on 07/24/2020), Disp: 30 mL, Rfl: 0 .  ondansetron (ZOFRAN) 8 MG tablet, TAKE 1 TABLET (8 MG TOTAL) BY MOUTH 2 (TWO) TIMES DAILY AS NEEDED (NAUSEA OR VOMITING)., Disp: 30 tablet, Rfl: 1 .  oxyCODONE (OXY IR/ROXICODONE) 5 MG immediate release tablet, Take 0.5-1 tablets (2.5-5 mg total) by mouth every 4 (four) hours as needed for severe pain. (Patient not taking: Reported on 07/24/2020), Disp: 30 tablet, Rfl: 0 .  potassium chloride (KLOR-CON) 10 MEQ tablet, Take 1 tablet (10 mEq total) by mouth daily., Disp: 7 tablet, Rfl: 0 .  prochlorperazine (COMPAZINE) 10 MG tablet, Take 1 tablet (10 mg total) by mouth every 6 (six) hours as needed (Nausea or vomiting). (Patient not taking: Reported on 07/24/2020), Disp: 30 tablet, Rfl: 1  Current Facility-Administered Medications:  .  gemcitabine (GEMZAR) chemo syringe for bladder instillation 2,000 mg, 2,000 mg, Bladder Instillation, Once, Hollice Espy, MD  Facility-Administered Medications Ordered in Other Visits:  .  sodium chloride flush (NS) 0.9 % injection 10 mL, 10 mL, Intracatheter, PRN, Jacquelin Hawking, NP, 10 mL at 07/24/20 1210  Physical exam:  Vitals:   07/24/20 1113  BP: 136/81  Pulse: 97  Resp: 16  Temp: 98.1 F (36.7 C)  TempSrc: Oral  Weight: 143 lb 8 oz (65.1 kg)  Height: 5\' 2"  (1.575 m)   Physical Exam Constitutional:      General: He is not in acute distress. HENT:     Mouth/Throat:     Mouth: Mucous membranes are moist.     Pharynx: Oropharynx is clear.  Cardiovascular:     Rate and Rhythm: Normal rate and regular rhythm.     Heart  sounds: Normal heart sounds.  Pulmonary:     Effort: Pulmonary effort is normal.     Breath sounds: Normal breath sounds.  Abdominal:     General: Bowel sounds are normal.     Palpations: Abdomen is soft.  Musculoskeletal:        General: No swelling.     Right lower leg: No edema.     Left lower leg: No edema.  Skin:    General: Skin is  warm and dry.  Neurological:     Mental Status: He is alert and oriented to person, place, and time.      CMP Latest Ref Rng & Units 07/18/2020  Glucose 70 - 99 mg/dL 129(H)  BUN 8 - 23 mg/dL 11  Creatinine 0.61 - 1.24 mg/dL 0.71  Sodium 135 - 145 mmol/L 138  Potassium 3.5 - 5.1 mmol/L 3.4(L)  Chloride 98 - 111 mmol/L 98  CO2 22 - 32 mmol/L 28  Calcium 8.9 - 10.3 mg/dL 8.6(L)  Total Protein 6.5 - 8.1 g/dL 6.9  Total Bilirubin 0.3 - 1.2 mg/dL 0.6  Alkaline Phos 38 - 126 U/L 83  AST 15 - 41 U/L 33  ALT 0 - 44 U/L 46(H)   CBC Latest Ref Rng & Units 07/18/2020  WBC 4.0 - 10.5 K/uL 6.6  Hemoglobin 13.0 - 17.0 g/dL 11.7(L)  Hematocrit 39.0 - 52.0 % 35.0(L)  Platelets 150 - 400 K/uL 163     Assessment and plan- Patient is a 75 y.o. 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  He is currently undergoing concurrent chemoradiation treatment and this is a routine follow-up visit  Patient will be completing day 20 of 5-FU infusional chemotherapy today and will be getting his pump disconnected today.  He completes radiation treatment on 08/10/2020 and I will see him on Monday with CBC with differential and CMP.  Plan to get repeat CT abdomen pelvis sometime in early June 2022.  He has a follow-up appointment with Dr. Erlene Quan in July for a repeat cystoscopy.  Burning pain in his bilateral feet: Possibly secondary to peripheral neuropathy.  However typically 5-FU chemotherapy and mitomycin does not cause peripheral neuropathy.  He has had some neuropathic symptoms in his right hand even prior to starting chemotherapy but not in his feet.  I  will see how his symptoms progress in the next 2 weeks and reassess on 08/10/2020 if he needs to be started on any medications.  Patient and his daughter verbalized understanding   Visit Diagnosis 1. Malignant neoplasm of overlapping sites of bladder [facility])      Dr. Randa Evens, MD, MPH [facility] at [facility] [phone] 07/24/2020 1:25 PM

## 2020-07-24 NOTE — Progress Notes (Signed)
Pt drinks water and has 2 ensure a day. He snack on things but really does not feel hungry. He is urinating and having good BM for now. He aches in his legs mostly in he bend of his leg behind knee, sometimes he feels like that his legs will fail him and he may fall; but he has never fell. He also says that his fatigue is there. He uses voltaren gel and then if it does not work he uses tylenol.

## 2020-07-25 ENCOUNTER — Ambulatory Visit
Admission: RE | Admit: 2020-07-25 | Discharge: 2020-07-25 | Disposition: A | Discharge status: Home / Self Care | Payer: Medicare HMO | Source: Ambulatory Visit | Attending: Radiation Oncology | Admitting: Radiation Oncology

## 2020-07-25 ENCOUNTER — Ambulatory Visit: Payer: Medicare HMO | Admitting: Oncology

## 2020-07-25 DIAGNOSIS — Z510 Encounter for antineoplastic radiation therapy: Secondary | ICD-10-CM | POA: Diagnosis not present

## 2020-07-26 ENCOUNTER — Ambulatory Visit: Admission: RE | Admit: 2020-07-26 | Payer: Medicare HMO | Source: Ambulatory Visit

## 2020-07-26 ENCOUNTER — Ambulatory Visit
Admission: RE | Admit: 2020-07-26 | Discharge: 2020-07-26 | Disposition: A | Discharge status: Home / Self Care | Payer: Medicare HMO | Source: Ambulatory Visit | Attending: Radiation Oncology | Admitting: Radiation Oncology

## 2020-07-26 DIAGNOSIS — Z510 Encounter for antineoplastic radiation therapy: Secondary | ICD-10-CM | POA: Diagnosis not present

## 2020-07-27 ENCOUNTER — Encounter: Payer: Self-pay | Admitting: *Deleted

## 2020-07-27 ENCOUNTER — Ambulatory Visit
Admission: RE | Admit: 2020-07-27 | Discharge: 2020-07-27 | Disposition: A | Discharge status: Home / Self Care | Payer: Medicare HMO | Source: Ambulatory Visit | Attending: Radiation Oncology | Admitting: Radiation Oncology

## 2020-07-27 ENCOUNTER — Other Ambulatory Visit: Payer: Self-pay | Admitting: *Deleted

## 2020-07-27 DIAGNOSIS — C678 Malignant neoplasm of overlapping sites of bladder: Secondary | ICD-10-CM

## 2020-07-27 DIAGNOSIS — M79661 Pain in right lower leg: Secondary | ICD-10-CM

## 2020-07-27 DIAGNOSIS — Z510 Encounter for antineoplastic radiation therapy: Secondary | ICD-10-CM | POA: Diagnosis not present

## 2020-07-28 ENCOUNTER — Other Ambulatory Visit: Payer: Self-pay

## 2020-07-28 ENCOUNTER — Ambulatory Visit
Admission: RE | Admit: 2020-07-28 | Discharge: 2020-07-28 | Disposition: A | Discharge status: Home / Self Care | Payer: Medicare HMO | Source: Ambulatory Visit | Attending: Radiation Oncology | Admitting: Radiation Oncology

## 2020-07-28 ENCOUNTER — Telehealth: Payer: Self-pay | Admitting: Oncology

## 2020-07-28 ENCOUNTER — Encounter: Payer: Self-pay | Admitting: *Deleted

## 2020-07-28 ENCOUNTER — Ambulatory Visit
Admission: RE | Admit: 2020-07-28 | Discharge: 2020-07-28 | Disposition: A | Discharge status: Home / Self Care | Payer: Medicare HMO | Source: Ambulatory Visit | Attending: Oncology | Admitting: Oncology

## 2020-07-28 DIAGNOSIS — C678 Malignant neoplasm of overlapping sites of bladder: Secondary | ICD-10-CM | POA: Insufficient documentation

## 2020-07-28 DIAGNOSIS — M79662 Pain in left lower leg: Secondary | ICD-10-CM | POA: Insufficient documentation

## 2020-07-28 DIAGNOSIS — M79661 Pain in right lower leg: Secondary | ICD-10-CM | POA: Diagnosis present

## 2020-07-28 DIAGNOSIS — Z510 Encounter for antineoplastic radiation therapy: Secondary | ICD-10-CM | POA: Diagnosis not present

## 2020-07-28 NOTE — Telephone Encounter (Signed)
Spoke with pt's daughter Caryl Bis see if pt could come to the hospital for an U/S prior to radiation appt.today. Daughter confirmed.

## 2020-07-31 ENCOUNTER — Ambulatory Visit
Admission: RE | Admit: 2020-07-31 | Discharge: 2020-07-31 | Disposition: A | Discharge status: Home / Self Care | Payer: Medicare HMO | Source: Ambulatory Visit | Attending: Radiation Oncology | Admitting: Radiation Oncology

## 2020-07-31 DIAGNOSIS — Z510 Encounter for antineoplastic radiation therapy: Secondary | ICD-10-CM | POA: Diagnosis not present

## 2020-08-01 ENCOUNTER — Ambulatory Visit
Admission: RE | Admit: 2020-08-01 | Discharge: 2020-08-01 | Disposition: A | Discharge status: Home / Self Care | Payer: Medicare HMO | Source: Ambulatory Visit | Attending: Radiation Oncology | Admitting: Radiation Oncology

## 2020-08-01 DIAGNOSIS — Z510 Encounter for antineoplastic radiation therapy: Secondary | ICD-10-CM | POA: Diagnosis not present

## 2020-08-02 ENCOUNTER — Ambulatory Visit
Admission: RE | Admit: 2020-08-02 | Discharge: 2020-08-02 | Disposition: A | Discharge status: Home / Self Care | Payer: Medicare HMO | Source: Ambulatory Visit | Attending: Radiation Oncology | Admitting: Radiation Oncology

## 2020-08-02 DIAGNOSIS — Z510 Encounter for antineoplastic radiation therapy: Secondary | ICD-10-CM | POA: Diagnosis not present

## 2020-08-03 ENCOUNTER — Ambulatory Visit
Admission: RE | Admit: 2020-08-03 | Discharge: 2020-08-03 | Disposition: A | Discharge status: Home / Self Care | Payer: Medicare HMO | Source: Ambulatory Visit | Attending: Radiation Oncology | Admitting: Radiation Oncology

## 2020-08-03 DIAGNOSIS — Z510 Encounter for antineoplastic radiation therapy: Secondary | ICD-10-CM | POA: Diagnosis not present

## 2020-08-04 ENCOUNTER — Ambulatory Visit
Admission: RE | Admit: 2020-08-04 | Discharge: 2020-08-04 | Disposition: A | Discharge status: Home / Self Care | Payer: Medicare HMO | Source: Ambulatory Visit | Attending: Radiation Oncology | Admitting: Radiation Oncology

## 2020-08-04 DIAGNOSIS — Z510 Encounter for antineoplastic radiation therapy: Secondary | ICD-10-CM | POA: Diagnosis not present

## 2020-08-07 ENCOUNTER — Ambulatory Visit
Admission: RE | Admit: 2020-08-07 | Discharge: 2020-08-07 | Disposition: A | Discharge status: Home / Self Care | Payer: Medicare HMO | Source: Ambulatory Visit | Attending: Radiation Oncology | Admitting: Radiation Oncology

## 2020-08-07 DIAGNOSIS — Z510 Encounter for antineoplastic radiation therapy: Secondary | ICD-10-CM | POA: Diagnosis not present

## 2020-08-08 ENCOUNTER — Encounter: Payer: Self-pay | Admitting: *Deleted

## 2020-08-08 ENCOUNTER — Ambulatory Visit
Admission: RE | Admit: 2020-08-08 | Discharge: 2020-08-08 | Disposition: A | Discharge status: Home / Self Care | Payer: Medicare HMO | Source: Ambulatory Visit | Attending: Radiation Oncology | Admitting: Radiation Oncology

## 2020-08-08 DIAGNOSIS — Z510 Encounter for antineoplastic radiation therapy: Secondary | ICD-10-CM | POA: Diagnosis not present

## 2020-08-09 ENCOUNTER — Ambulatory Visit
Admission: RE | Admit: 2020-08-09 | Discharge: 2020-08-09 | Disposition: A | Discharge status: Home / Self Care | Payer: Medicare HMO | Source: Ambulatory Visit | Attending: Radiation Oncology | Admitting: Radiation Oncology

## 2020-08-09 DIAGNOSIS — Z510 Encounter for antineoplastic radiation therapy: Secondary | ICD-10-CM | POA: Diagnosis not present

## 2020-08-10 ENCOUNTER — Ambulatory Visit
Admission: RE | Admit: 2020-08-10 | Discharge: 2020-08-10 | Disposition: A | Discharge status: Home / Self Care | Payer: Medicare HMO | Source: Ambulatory Visit | Attending: Radiation Oncology | Admitting: Radiation Oncology

## 2020-08-10 ENCOUNTER — Telehealth: Payer: Self-pay | Admitting: Oncology

## 2020-08-10 ENCOUNTER — Inpatient Hospital Stay (HOSPITAL_BASED_OUTPATIENT_CLINIC_OR_DEPARTMENT_OTHER): Payer: Medicare HMO | Admitting: Oncology

## 2020-08-10 ENCOUNTER — Inpatient Hospital Stay: Payer: Medicare HMO

## 2020-08-10 ENCOUNTER — Other Ambulatory Visit: Payer: Self-pay

## 2020-08-10 ENCOUNTER — Encounter: Payer: Self-pay | Admitting: Oncology

## 2020-08-10 VITALS — BP 152/79 | HR 84 | Temp 97.9°F | Resp 16 | Wt 148.7 lb

## 2020-08-10 DIAGNOSIS — C678 Malignant neoplasm of overlapping sites of bladder: Secondary | ICD-10-CM

## 2020-08-10 DIAGNOSIS — Z510 Encounter for antineoplastic radiation therapy: Secondary | ICD-10-CM | POA: Diagnosis not present

## 2020-08-10 DIAGNOSIS — Z5111 Encounter for antineoplastic chemotherapy: Secondary | ICD-10-CM | POA: Diagnosis not present

## 2020-08-10 DIAGNOSIS — Z5189 Encounter for other specified aftercare: Secondary | ICD-10-CM

## 2020-08-10 LAB — CBC WITH DIFFERENTIAL/PLATELET
Abs Immature Granulocytes: 0.05 10*3/uL (ref 0.00–0.07)
Basophils Absolute: 0 10*3/uL (ref 0.0–0.1)
Basophils Relative: 0 %
Eosinophils Absolute: 0 10*3/uL (ref 0.0–0.5)
Eosinophils Relative: 0 %
HCT: 37.9 % — ABNORMAL LOW (ref 39.0–52.0)
Hemoglobin: 12.5 g/dL — ABNORMAL LOW (ref 13.0–17.0)
Immature Granulocytes: 1 %
Lymphocytes Relative: 13 %
Lymphs Abs: 0.6 10*3/uL — ABNORMAL LOW (ref 0.7–4.0)
MCH: 30 pg (ref 26.0–34.0)
MCHC: 33 g/dL (ref 30.0–36.0)
MCV: 91.1 fL (ref 80.0–100.0)
Monocytes Absolute: 0.6 10*3/uL (ref 0.1–1.0)
Monocytes Relative: 14 %
Neutro Abs: 3.2 10*3/uL (ref 1.7–7.7)
Neutrophils Relative %: 72 %
Platelets: 206 10*3/uL (ref 150–400)
RBC: 4.16 MIL/uL — ABNORMAL LOW (ref 4.22–5.81)
RDW: 16.9 % — ABNORMAL HIGH (ref 11.5–15.5)
WBC: 4.5 10*3/uL (ref 4.0–10.5)
nRBC: 0 % (ref 0.0–0.2)

## 2020-08-10 LAB — COMPREHENSIVE METABOLIC PANEL
ALT: 23 U/L (ref 0–44)
AST: 23 U/L (ref 15–41)
Albumin: 3.7 g/dL (ref 3.5–5.0)
Alkaline Phosphatase: 68 U/L (ref 38–126)
Anion gap: 11 (ref 5–15)
BUN: 11 mg/dL (ref 8–23)
CO2: 29 mmol/L (ref 22–32)
Calcium: 8.7 mg/dL — ABNORMAL LOW (ref 8.9–10.3)
Chloride: 102 mmol/L (ref 98–111)
Creatinine, Ser: 0.79 mg/dL (ref 0.61–1.24)
GFR, Estimated: >60 mL/min (ref >60)
Glucose, Bld: 120 mg/dL — ABNORMAL HIGH (ref 70–99)
Potassium: 3.6 mmol/L (ref 3.5–5.1)
Sodium: 142 mmol/L (ref 135–145)
Total Bilirubin: 0.5 mg/dL (ref 0.3–1.2)
Total Protein: 6.8 g/dL (ref 6.5–8.1)

## 2020-08-10 NOTE — Telephone Encounter (Signed)
Daughter left message stating they are unable to get to the 9am nutrition appointment due to transportation.  Secure chat message sent to Joseph Art for follow up as the patient is coming to see the provider at 10 am today.

## 2020-08-10 NOTE — Progress Notes (Signed)
Pt feels better from no sores in mouth now. He is feeling good. Still has breathing problems but on meds. He did talk about allergy to shrimp but he ate it all his life and then got tested and it was positive and then 2018 tested again and he was neg. I told him I will ask rao

## 2020-08-10 NOTE — Progress Notes (Signed)
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 He presented with symptoms of gross hematuria and underwent CT urogram which showed 2 lesions in the bladder 1 measuring 2.1 x 2.1 cm in the posterior bladder wall as well as an eccentric mass just left of the midline measuring 1.6 x 1.1 cm. Patient was seen by urology and underwent cystoscopy. Visualization was limited due to gross hematuria. Large at least 2 cm spherical high-grade appearing tumor in the midline posterior bladder wall. This was followed by TURBT which showed a 2.5 cm posterior bladder tumor appearing necrotic/hemorrhagic another 2 cm nodular area which had a submucosal solid infiltrative appearance. Significant bladder varices and hypervascularity surrounding tumors. Pathology showed invasive urothelial carcinoma high-grade with high suspicion for muscularis propria involvement. Scattered small fragments of muscularis propria identified an invasive tumor was seen adjacent to these muscle bundles. However unequivocal muscle invasion was not readily apparent. Patient could not urinate following his TURBT and required temporary placement of Foley catheter but was able to pass his trial of void later. Risk of repeat TURBT was discussed and mutual consensus was to avoid another invasive procedure given his oxygen dependent COPD. Case discussed at  tumor board. He was not deemed to be a good candidate for cystoprostatectomy. Based on the appearance of the tumor at the time of TURBT as well as pathology, it was deemed that there was a high risk of muscle invasion.  Patient receiving concurrent chemoradiation with 5-FU mitomycin. Day 1 given on 06/20/2020 along with 1 dose of mitomycin  Interval history-patient reports that burning sensation and pain in his bilateral feet and calf has resolved.  He feels at his baseline state of health.  Denies any mouth sores.  ECOG PS- 2 Pain scale- 0   Review of systems- Review of Systems  Constitutional: Positive for malaise/fatigue. Negative for chills, fever and weight loss.  HENT: Negative for congestion, ear discharge and nosebleeds.   Eyes: Negative for blurred vision.  Respiratory: Negative for cough, hemoptysis, sputum production, shortness of breath and wheezing.   Cardiovascular: Negative for chest pain, palpitations, orthopnea and claudication.  Gastrointestinal: Negative for abdominal pain, blood in stool, constipation, diarrhea, heartburn, melena, nausea and vomiting.  Genitourinary: Negative for dysuria, flank pain, frequency, hematuria and urgency.  Musculoskeletal: Negative for back pain, joint pain and myalgias.  Skin: Negative for rash.  Neurological: Negative for dizziness, tingling, focal weakness, seizures, weakness and headaches.  Endo/Heme/Allergies: Does not bruise/bleed easily.  Psychiatric/Behavioral: Negative for depression and suicidal ideas. The patient does not have insomnia.      Allergies  Allergen Reactions  . Shrimp [Shellfish Allergy] Other (See Comments)    Per MD comments critical     Past Medical History:  Diagnosis Date  . Arthritis   . Asthma   . Bladder cancer ([place])   . Cancer ([place])   . COPD (chronic obstructive pulmonary disease) ([place])   . Dyspnea   . GERD (gastroesophageal reflux  disease)   . Hypertension      Past Surgical History:   Procedure Laterality Date  . COLONOSCOPY    . HERNIA REPAIR  2017  . PORTA CATH INSERTION N/A 06/19/2020   Procedure: PORTA CATH INSERTION;  Surgeon: Algernon Huxley, MD;  Location: [place] [facility];  Service: Cardiovascular;  Laterality: N/A;  . TRANSURETHRAL RESECTION OF BLADDER TUMOR WITH MITOMYCIN-C N/A 05/22/2020   Procedure: TRANSURETHRAL RESECTION OF BLADDER TUMOR WITH Gemcitabine;  Surgeon: Hollice Espy, MD;  Location: [facility];  Service: Urology;  Laterality: N/A;    Social History   Socioeconomic History  . Marital status: Married    Spouse name: Not on file  . Number of children: Not on file  . Years of education: Not on file  . Highest education level: Not on file  Occupational History  . Not on file  Tobacco Use  . Smoking status: Former Smoker    Packs/day: 1.50    Years: 40.00    Pack years: 60.00    Types: Cigarettes    Quit date: 05/21/2008    Years since quitting: 12.2  . Smokeless tobacco: Never Used  Vaping Use  . Vaping Use: Never used  Substance and Sexual Activity  . Alcohol use: Not Currently  . Drug use: Never  . Sexual activity: Not Currently  Other Topics Concern  . Not on file  Social History Narrative  . Not on file   Social Determinants of Health   Financial Resource Strain: Not on file  Food Insecurity: Not on file  Transportation Needs: Not on file  Physical Activity: Not on file  Stress: Not on file  Social Connections: Not on file  Intimate Partner Violence: Not on file    History reviewed. No pertinent family history.   Current Outpatient Medications:  .  acetaminophen (TYLENOL) 500 MG tablet, Take 500 mg by mouth every 8 (eight) hours as needed for moderate pain., Disp: , Rfl:  .  albuterol (PROVENTIL HFA;VENTOLIN HFA) 108 (90 Base) MCG/ACT inhaler, Inhale 2 puffs into the lungs every 4 (four) hours as needed for wheezing or shortness of breath., Disp: , Rfl:  .  albuterol (PROVENTIL) (2.5 MG/3ML) 0.083% nebulizer solution,  Take 2.5 mg by nebulization every 4 (four) hours as needed for wheezing or shortness of breath., Disp: , Rfl:  .  amLODipine (NORVASC) 10 MG tablet, Take 10 mg by mouth daily., Disp: , Rfl:  .  arformoterol (BROVANA) 15 MCG/2ML NEBU, Take 15 mcg by nebulization 2 (two) times daily., Disp: , Rfl:  .  budesonide (PULMICORT) 0.5 MG/2ML nebulizer solution, Take 0.5 mg by nebulization 2 (two) times daily., Disp: , Rfl:  .  cetirizine (ZYRTEC) 10 MG tablet, Chew 10 mg by mouth daily., Disp: , Rfl:  .  diclofenac Sodium (VOLTAREN) 1 % GEL, Apply 1 application topically 2 (two) times daily as needed (pain)., Disp: , Rfl:  .  fluticasone (FLONASE) 50 MCG/ACT nasal spray, Place 1 spray into both nostrils daily., Disp: , Rfl:  .  hydrochlorothiazide (HYDRODIURIL) 25 MG tablet, Take 25 mg by mouth daily., Disp: , Rfl:  .  lidocaine-prilocaine (EMLA) cream, Apply to affected area once, Disp: 30 g, Rfl: 3 .  Mepolizumab (NUCALA) 100 MG SOLR, Inject 100 mg into the skin every 30 (thirty) days., Disp: , Rfl:  .  montelukast (SINGULAIR) 10 MG tablet, Take 10 mg by mouth at bedtime., Disp: , Rfl:  .  pantoprazole (PROTONIX) 40 MG tablet, Take 40 mg  by mouth daily., Disp: , Rfl:  .  potassium chloride (KLOR-CON) 10 MEQ tablet, Take 1 tablet (10 mEq total) by mouth daily., Disp: 7 tablet, Rfl: 0 .  prochlorperazine (COMPAZINE) 10 MG tablet, Take 1 tablet (10 mg total) by mouth every 6 (six) hours as needed (Nausea or vomiting)., Disp: 30 tablet, Rfl: 1 .  roflumilast (DALIRESP) 500 MCG TABS tablet, Take 500 mcg by mouth daily., Disp: , Rfl:  .  simvastatin (ZOCOR) 20 MG tablet, Take 20 mg by mouth daily., Disp: , Rfl:  .  tamsulosin (FLOMAX) 0.4 MG CAPS capsule, Take 1 capsule (0.4 mg total) by mouth daily., Disp: 30 capsule, Rfl: 0 .  tiotropium (SPIRIVA) 18 MCG inhalation capsule, Place 18 mcg into inhaler and inhale daily., Disp: , Rfl:  .  traMADol (ULTRAM) 50 MG tablet, Take 50 mg by mouth 2 (two) times daily.,  Disp: , Rfl:  .  LORazepam (ATIVAN) 0.5 MG tablet, Take 1 tablet (0.5 mg total) by mouth every 6 (six) hours as needed (Nausea or vomiting). (Patient not taking: Reported on 08/10/2020), Disp: 30 tablet, Rfl: 0 .  ondansetron (ZOFRAN) 8 MG tablet, TAKE 1 TABLET (8 MG TOTAL) BY MOUTH 2 (TWO) TIMES DAILY AS NEEDED (NAUSEA OR VOMITING). (Patient not taking: Reported on 08/10/2020), Disp: 30 tablet, Rfl: 1  Current Facility-Administered Medications:  .  gemcitabine (GEMZAR) chemo syringe for bladder instillation 2,000 mg, 2,000 mg, Bladder Instillation, Once, Hollice Espy, MD  Physical exam:  Vitals:   08/10/20 1052  BP: (!) 152/79  Pulse: 84  Resp: 16  Temp: 97.9 F (36.6 C)  TempSrc: Oral  Weight: 148 lb 11.2 oz (67.4 kg)   Physical Exam Constitutional:      General: He is not in acute distress.    Comments: On home oxygen.  Appears in no acute distress  Cardiovascular:     Rate and Rhythm: Normal rate.  Pulmonary:     Effort: Pulmonary effort is normal.  Skin:    General: Skin is warm and dry.  Neurological:     Mental Status: He is alert and oriented to person, place, and time.      CMP Latest Ref Rng & Units 08/10/2020  Glucose 70 - 99 mg/dL 120(H)  BUN 8 - 23 mg/dL 11  Creatinine 0.61 - 1.24 mg/dL 0.79  Sodium 135 - 145 mmol/L 142  Potassium 3.5 - 5.1 mmol/L 3.6  Chloride 98 - 111 mmol/L 102  CO2 22 - 32 mmol/L 29  Calcium 8.9 - 10.3 mg/dL 8.7(L)  Total Protein 6.5 - 8.1 g/dL 6.8  Total Bilirubin 0.3 - 1.2 mg/dL 0.5  Alkaline Phos 38 - 126 U/L 68  AST 15 - 41 U/L 23  ALT 0 - 44 U/L 23   CBC Latest Ref Rng & Units 08/10/2020  WBC 4.0 - 10.5 K/uL 4.5  Hemoglobin 13.0 - 17.0 g/dL 12.5(L)  Hematocrit 39.0 - 52.0 % 37.9(L)  Platelets 150 - 400 K/uL 206    No images are attached to the encounter.  US Venous Img Lower Bilateral  Result Date: 07/28/2020 CLINICAL DATA:  75 year old male with bilateral calf pain for 4-5 days. EXAM: BILATERAL LOWER EXTREMITY VENOUS  DOPPLER ULTRASOUND TECHNIQUE: Gray-scale sonography with graded compression, as well as color Doppler and duplex ultrasound were performed to evaluate the lower extremity deep venous systems from the level of the common femoral vein and including the common femoral, femoral, profunda femoral, popliteal and calf veins including the posterior tibial, peroneal and  gastrocnemius veins when visible. The superficial great saphenous vein was also interrogated. Spectral Doppler was utilized to evaluate flow at rest and with distal augmentation maneuvers in the common femoral, femoral and popliteal veins. COMPARISON:  None. FINDINGS: RIGHT LOWER EXTREMITY Common Femoral Vein: No evidence of thrombus. Normal compressibility, respiratory phasicity and response to augmentation. Saphenofemoral Junction: No evidence of thrombus. Normal compressibility and flow on color Doppler imaging. Profunda Femoral Vein: No evidence of thrombus. Normal compressibility and flow on color Doppler imaging. Femoral Vein: No evidence of thrombus. Normal compressibility, respiratory phasicity and response to augmentation. Popliteal Vein: No evidence of thrombus. Normal compressibility, respiratory phasicity and response to augmentation. Calf Veins: No evidence of thrombus. Normal compressibility and flow on color Doppler imaging. Other Findings:  None. LEFT LOWER EXTREMITY Common Femoral Vein: No evidence of thrombus. Normal compressibility, respiratory phasicity and response to augmentation. Saphenofemoral Junction: No evidence of thrombus. Normal compressibility and flow on color Doppler imaging. Profunda Femoral Vein: No evidence of thrombus. Normal compressibility and flow on color Doppler imaging. Femoral Vein: No evidence of thrombus. Normal compressibility, respiratory phasicity and response to augmentation. Popliteal Vein: No evidence of thrombus. Normal compressibility, respiratory phasicity and response to augmentation. Calf Veins: No  evidence of thrombus. Normal compressibility and flow on color Doppler imaging. Other Findings:  None. IMPRESSION: No evidence of bilateral lower extremity deep vein thrombosis. Ruthann Cancer, MD [facility] Electronically Signed   By: Ruthann Cancer MD   On: 07/28/2020 11:27     Assessment and plan- Patient is a 74 y.o. 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  He is s/p concurrent chemoradiation this is a routine follow-up visit  Patient has completed infusional 5-FU chemotherapy along with concurrent radiation.  Radiation treatment completes today.  He has tolerated treatment well so far.  He denies any urinary symptoms at this time.  Bilateral calf pain feet pain has resolved.  I will plan to get repeat CT chest abdomen pelvis with contrast in early June 2022.  He has an appointment with Dr. Erlene Quan in July for repeat cystoscopy.  I will see him with labs after CT scans.  For now he will have his port in place and get it flushed every 3 months   Visit Diagnosis 1. Malignant neoplasm of overlapping sites of bladder [facility])      Dr. Randa Evens, MD, MPH [facility] [phone] 08/10/2020 2:23 PM

## 2020-08-30 ENCOUNTER — Other Ambulatory Visit: Payer: Self-pay | Admitting: Oncology

## 2020-08-30 DIAGNOSIS — C678 Malignant neoplasm of overlapping sites of bladder: Secondary | ICD-10-CM

## 2020-09-14 ENCOUNTER — Encounter: Payer: Self-pay | Admitting: Radiation Oncology

## 2020-09-14 ENCOUNTER — Ambulatory Visit
Admission: RE | Admit: 2020-09-14 | Discharge: 2020-09-14 | Disposition: A | Discharge status: Home / Self Care | Payer: Medicare HMO | Source: Ambulatory Visit | Attending: Radiation Oncology | Admitting: Radiation Oncology

## 2020-09-14 ENCOUNTER — Other Ambulatory Visit: Payer: Self-pay

## 2020-09-14 ENCOUNTER — Other Ambulatory Visit: Payer: Self-pay | Admitting: Urology

## 2020-09-14 VITALS — BP 161/85 | HR 88 | Temp 97.2°F | Wt 150.0 lb

## 2020-09-14 DIAGNOSIS — Z9221 Personal history of antineoplastic chemotherapy: Secondary | ICD-10-CM | POA: Insufficient documentation

## 2020-09-14 DIAGNOSIS — Z923 Personal history of irradiation: Secondary | ICD-10-CM | POA: Insufficient documentation

## 2020-09-14 DIAGNOSIS — R140 Abdominal distension (gaseous): Secondary | ICD-10-CM | POA: Insufficient documentation

## 2020-09-14 DIAGNOSIS — C678 Malignant neoplasm of overlapping sites of bladder: Secondary | ICD-10-CM | POA: Insufficient documentation

## 2020-09-14 DIAGNOSIS — Z9981 Dependence on supplemental oxygen: Secondary | ICD-10-CM | POA: Diagnosis not present

## 2020-09-14 NOTE — Progress Notes (Signed)
Radiation Oncology Follow up Note  Name: Joe Torres   Date:   09/14/2020 MRN:  [id number] DOB: May 20, 1945    This 75 y.o. 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  Patient is oxygen dependent declined cystoprostatectomy.  Seen today in routine follow-up he is doing well no more hematuria.  Specifically denies any increased lower urinary tract symptoms diarrhea or fatigue.  Does occasionally have some bloating in his abdomen.  COMPLICATIONS OF TREATMENT: none  FOLLOW UP COMPLIANCE: keeps appointments   PHYSICAL EXAM:  BP (!) 161/85   Pulse 88   Temp (!) 97.2 F (36.2 C) (Tympanic)   Wt 150 lb (68 kg)   SpO2 96% Comment: 2.5 o2  BMI 27.44 kg/m  Well-developed well-nourished patient in NAD. HEENT reveals PERLA, EOMI, discs not visualized.  Oral cavity is clear. No oral mucosal lesions are identified. Neck is clear without evidence of cervical or supraclavicular adenopathy. Lungs are clear to A&P. Cardiac examination is essentially unremarkable with regular rate and rhythm without murmur rub or thrill. Abdomen is benign with no organomegaly or masses noted. Motor sensory and DTR levels are equal and symmetric in the upper and lower extremities. Cranial nerves II through XII are grossly intact. Proprioception is intact. No peripheral adenopathy or edema is identified. No motor or sensory levels are noted. Crude visual fields are within normal range.  RADIOLOGY RESULTS: CT scan scheduled for approximately month  PLAN: Present time patient is well with very low side effect profile from his concurrent chemoradiation for his  transitional cell carcinoma the bladder.  On pleased with his overall progress.  Of asked to see him back in 4 to 5 months.  He is already scheduled for cystoscopy as well as follow-up CT scans.  Patient and family know to call with any concerns.  I would like to take this opportunity to thank you for allowing me to participate in the care of your patient.Noreene Filbert, MD

## 2020-09-22 ENCOUNTER — Other Ambulatory Visit: Payer: Self-pay | Admitting: Oncology

## 2020-09-22 DIAGNOSIS — C678 Malignant neoplasm of overlapping sites of bladder: Secondary | ICD-10-CM

## 2020-09-28 ENCOUNTER — Telehealth: Payer: Self-pay | Admitting: Oncology

## 2020-09-28 NOTE — Telephone Encounter (Signed)
Spoke to patients daughter Lonna Duval.  They have decided not to keep the nutrition consult appointment scheduled for 5/31.  They are going to come for the port flush and will call back if they decide to reschedule the nutrition consult.

## 2020-10-10 ENCOUNTER — Inpatient Hospital Stay: Payer: Medicare HMO | Attending: Oncology

## 2020-10-10 DIAGNOSIS — Z79899 Other long term (current) drug therapy: Secondary | ICD-10-CM | POA: Diagnosis not present

## 2020-10-10 DIAGNOSIS — C678 Malignant neoplasm of overlapping sites of bladder: Secondary | ICD-10-CM | POA: Diagnosis present

## 2020-10-10 DIAGNOSIS — Z923 Personal history of irradiation: Secondary | ICD-10-CM | POA: Insufficient documentation

## 2020-10-10 DIAGNOSIS — Z9221 Personal history of antineoplastic chemotherapy: Secondary | ICD-10-CM | POA: Insufficient documentation

## 2020-10-10 DIAGNOSIS — I10 Essential (primary) hypertension: Secondary | ICD-10-CM | POA: Insufficient documentation

## 2020-10-10 DIAGNOSIS — Z87891 Personal history of nicotine dependence: Secondary | ICD-10-CM | POA: Diagnosis not present

## 2020-10-10 DIAGNOSIS — Z95828 Presence of other vascular implants and grafts: Secondary | ICD-10-CM

## 2020-10-10 DIAGNOSIS — Z452 Encounter for adjustment and management of vascular access device: Secondary | ICD-10-CM | POA: Insufficient documentation

## 2020-10-10 LAB — CBC WITH DIFFERENTIAL/PLATELET
Abs Immature Granulocytes: 0.04 10*3/uL (ref 0.00–0.07)
Basophils Absolute: 0 10*3/uL (ref 0.0–0.1)
Basophils Relative: 0 %
Eosinophils Absolute: 0 10*3/uL (ref 0.0–0.5)
Eosinophils Relative: 0 %
HCT: 38.5 % — ABNORMAL LOW (ref 39.0–52.0)
Hemoglobin: 12.9 g/dL — ABNORMAL LOW (ref 13.0–17.0)
Immature Granulocytes: 1 %
Lymphocytes Relative: 11 %
Lymphs Abs: 0.8 10*3/uL (ref 0.7–4.0)
MCH: 29.5 pg (ref 26.0–34.0)
MCHC: 33.5 g/dL (ref 30.0–36.0)
MCV: 88.1 fL (ref 80.0–100.0)
Monocytes Absolute: 0.8 10*3/uL (ref 0.1–1.0)
Monocytes Relative: 10 %
Neutro Abs: 5.7 10*3/uL (ref 1.7–7.7)
Neutrophils Relative %: 78 %
Platelets: 324 10*3/uL (ref 150–400)
RBC: 4.37 MIL/uL (ref 4.22–5.81)
RDW: 14.2 % (ref 11.5–15.5)
WBC: 7.3 10*3/uL (ref 4.0–10.5)
nRBC: 0 % (ref 0.0–0.2)

## 2020-10-10 LAB — COMPREHENSIVE METABOLIC PANEL
ALT: 19 U/L (ref 0–44)
AST: 22 U/L (ref 15–41)
Albumin: 3.9 g/dL (ref 3.5–5.0)
Alkaline Phosphatase: 68 U/L (ref 38–126)
Anion gap: 11 (ref 5–15)
BUN: 10 mg/dL (ref 8–23)
CO2: 27 mmol/L (ref 22–32)
Calcium: 8.5 mg/dL — ABNORMAL LOW (ref 8.9–10.3)
Chloride: 101 mmol/L (ref 98–111)
Creatinine, Ser: 0.85 mg/dL (ref 0.61–1.24)
GFR, Estimated: >60 mL/min (ref >60)
Glucose, Bld: 105 mg/dL — ABNORMAL HIGH (ref 70–99)
Potassium: 3.1 mmol/L — ABNORMAL LOW (ref 3.5–5.1)
Sodium: 139 mmol/L (ref 135–145)
Total Bilirubin: 0.7 mg/dL (ref 0.3–1.2)
Total Protein: 6.9 g/dL (ref 6.5–8.1)

## 2020-10-10 MED ORDER — SODIUM CHLORIDE 0.9% FLUSH
10.0000 mL | Freq: Once | INTRAVENOUS | Status: AC
  Administered 2020-10-10: 10 mL via INTRAVENOUS
  Filled 2020-10-10: qty 10

## 2020-10-10 MED ORDER — HEPARIN SOD (PORK) LOCK FLUSH 100 UNIT/ML IV SOLN
INTRAVENOUS | Status: AC
  Filled 2020-10-10: qty 5

## 2020-10-10 MED ORDER — HEPARIN SOD (PORK) LOCK FLUSH 100 UNIT/ML IV SOLN
500.0000 [IU] | Freq: Once | INTRAVENOUS | Status: AC
  Administered 2020-10-10: 500 [IU] via INTRAVENOUS
  Filled 2020-10-10: qty 5

## 2020-10-13 ENCOUNTER — Other Ambulatory Visit: Payer: Self-pay | Admitting: Urology

## 2020-10-13 ENCOUNTER — Other Ambulatory Visit: Payer: Self-pay

## 2020-10-13 ENCOUNTER — Telehealth: Payer: Self-pay | Admitting: *Deleted

## 2020-10-13 ENCOUNTER — Other Ambulatory Visit: Payer: Self-pay | Admitting: Oncology

## 2020-10-13 ENCOUNTER — Encounter (HOSPITAL_COMMUNITY): Payer: Self-pay

## 2020-10-13 ENCOUNTER — Ambulatory Visit (HOSPITAL_COMMUNITY)
Admission: RE | Admit: 2020-10-13 | Discharge: 2020-10-13 | Disposition: A | Discharge status: Home / Self Care | Payer: Medicare HMO | Source: Ambulatory Visit | Attending: Oncology | Admitting: Oncology

## 2020-10-13 DIAGNOSIS — C678 Malignant neoplasm of overlapping sites of bladder: Secondary | ICD-10-CM | POA: Insufficient documentation

## 2020-10-13 MED ORDER — SODIUM CHLORIDE (PF) 0.9 % IJ SOLN
INTRAMUSCULAR | Status: AC
  Filled 2020-10-13: qty 100

## 2020-10-13 MED ORDER — POTASSIUM CHLORIDE CRYS ER 20 MEQ PO TBCR: 20.0000 meq | EXTENDED_RELEASE_TABLET | Freq: Two times a day (BID) | ORAL | 0 refills | Status: DC

## 2020-10-13 MED ORDER — IOHEXOL 300 MG/ML  SOLN
100.0000 mL | Freq: Once | INTRAMUSCULAR | Status: AC | PRN
  Administered 2020-10-13: 100 mL via INTRAVENOUS

## 2020-10-13 NOTE — Telephone Encounter (Signed)
Called pt's house and got his daughter and let her know that when he had labs earlier in the week. His potassium was low and we wanted to send in 1 week of potassium to get level back up. They are agreeable and will send in 20 meq of potassium bid for 1 week. Rx sent

## 2020-10-16 ENCOUNTER — Encounter: Payer: Self-pay | Admitting: Oncology

## 2020-10-23 ENCOUNTER — Other Ambulatory Visit: Payer: Self-pay

## 2020-10-23 ENCOUNTER — Encounter: Payer: Self-pay | Admitting: Oncology

## 2020-10-23 ENCOUNTER — Inpatient Hospital Stay: Payer: Medicare HMO | Attending: Oncology | Admitting: Oncology

## 2020-10-23 VITALS — BP 133/81 | HR 88 | Temp 97.4°F | Resp 17 | Ht 62.0 in | Wt 149.1 lb

## 2020-10-23 DIAGNOSIS — R9389 Abnormal findings on diagnostic imaging of other specified body structures: Secondary | ICD-10-CM | POA: Diagnosis not present

## 2020-10-23 DIAGNOSIS — Z923 Personal history of irradiation: Secondary | ICD-10-CM | POA: Diagnosis not present

## 2020-10-23 DIAGNOSIS — Z79899 Other long term (current) drug therapy: Secondary | ICD-10-CM | POA: Diagnosis not present

## 2020-10-23 DIAGNOSIS — J438 Other emphysema: Secondary | ICD-10-CM | POA: Diagnosis not present

## 2020-10-23 DIAGNOSIS — C678 Malignant neoplasm of overlapping sites of bladder: Secondary | ICD-10-CM | POA: Diagnosis present

## 2020-10-23 DIAGNOSIS — Z7951 Long term (current) use of inhaled steroids: Secondary | ICD-10-CM | POA: Diagnosis not present

## 2020-10-23 DIAGNOSIS — I2510 Atherosclerotic heart disease of native coronary artery without angina pectoris: Secondary | ICD-10-CM | POA: Insufficient documentation

## 2020-10-23 DIAGNOSIS — I10 Essential (primary) hypertension: Secondary | ICD-10-CM | POA: Diagnosis not present

## 2020-10-23 DIAGNOSIS — J9811 Atelectasis: Secondary | ICD-10-CM | POA: Diagnosis not present

## 2020-10-23 NOTE — Progress Notes (Signed)
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  He presented with symptoms of gross hematuria and underwent CT urogram which showed 2 lesions in the bladder 1 measuring 2.1 x 2.1 cm in the posterior bladder wall as well as an eccentric mass just left of the midline measuring 1.6 x 1.1 cm.  Patient was seen by urology and underwent cystoscopy.  Visualization was limited due to gross hematuria.  Large at least 2 cm spherical high-grade appearing tumor in the midline posterior bladder wall.  This was followed by TURBT which showed a 2.5 cm posterior bladder tumor appearing necrotic/hemorrhagic another 2 cm nodular area which had a submucosal solid infiltrative appearance.  Significant bladder varices and hypervascularity surrounding tumors.  Pathology showed invasive urothelial carcinoma high-grade with high suspicion for muscularis propria involvement.  Scattered small fragments of muscularis propria identified an invasive tumor was seen adjacent to these muscle bundles.  However unequivocal muscle invasion was not readily apparent.  Patient could not urinate following his TURBT and required temporary placement of Foley catheter but was able to pass his trial of void later.  Risk of repeat TURBT was discussed and mutual consensus was to avoid another invasive procedure given his oxygen dependent COPD.  Case discussed at tumor board.  He was not deemed to be a good candidate  for cystoprostatectomy.  Based on the appearance of the tumor at the time of TURBT as well as pathology, it was deemed that there was a high risk of muscle invasion.   Patient completed concurrent chemoradiation to his bladder with 5-FU mitomycin regimen in March 2022.  Interval history-patient is here with his daughter today.  Overall he reports doing well.  He has baseline fatigue and exertional shortness of breath which is stable.  Denies any blood in his urine.  ECOG PS- 1 Pain scale- 0   Review of systems- Review of Systems  Constitutional:  Negative for chills, fever, malaise/fatigue and weight loss.  HENT:  Negative for congestion, ear discharge and nosebleeds.   Eyes:  Negative for blurred vision.  Respiratory:  Positive for shortness of breath. Negative for cough, hemoptysis, sputum production and wheezing.   Cardiovascular:  Negative for chest pain, palpitations, orthopnea and claudication.  Gastrointestinal:  Negative for abdominal pain, blood in stool, constipation, diarrhea, heartburn, melena, nausea and vomiting.  Genitourinary:  Negative for dysuria, flank pain, frequency, hematuria and urgency.  Musculoskeletal:  Negative for back pain, joint pain and myalgias.  Skin:  Negative for rash.  Neurological:  Negative for dizziness, tingling, focal weakness, seizures, weakness and headaches.  Endo/Heme/Allergies:  Does not bruise/bleed easily.  Psychiatric/Behavioral:  Negative for depression and suicidal ideas. The patient does not have insomnia.      Allergies  Allergen Reactions   Shrimp [Shellfish Allergy] Other (See Comments)    Per MD comments critical     Past Medical History:  Diagnosis Date   Arthritis    Asthma    Bladder cancer ([place])    Cancer (  HCC)    COPD (chronic obstructive pulmonary disease) (HCC)    Dyspnea    GERD (gastroesophageal reflux disease)    Hypertension      Past Surgical History:  Procedure Laterality Date   COLONOSCOPY     HERNIA  REPAIR  2017   PORTA CATH INSERTION N/A 06/19/2020   Procedure: PORTA CATH INSERTION;  Surgeon: Algernon Huxley, MD;  Location: [facility];  Service: Cardiovascular;  Laterality: N/A;   TRANSURETHRAL RESECTION OF BLADDER TUMOR WITH MITOMYCIN-C N/A 05/22/2020   Procedure: TRANSURETHRAL RESECTION OF BLADDER TUMOR WITH Gemcitabine;  Surgeon: Hollice Espy, MD;  Location: [facility];  Service: Urology;  Laterality: N/A;    Social History   Socioeconomic History   Marital status: Married    Spouse name: Not on file   Number of children: Not on file   Years of education: Not on file   Highest education level: Not on file  Occupational History   Not on file  Tobacco Use   Smoking status: Former    Packs/day: 1.50    Years: 40.00    Pack years: 60.00    Types: Cigarettes    Quit date: 05/21/2008    Years since quitting: 12.4   Smokeless tobacco: Never  Vaping Use   Vaping Use: Never used  Substance and Sexual Activity   Alcohol use: Not Currently   Drug use: Never   Sexual activity: Not Currently  Other Topics Concern   Not on file  Social History Narrative   Not on file   Social Determinants of Health   Financial Resource Strain: Not on file  Food Insecurity: Not on file  Transportation Needs: Not on file  Physical Activity: Not on file  Stress: Not on file  Social Connections: Not on file  Intimate Partner Violence: Not on file    No family history on file.   Current Outpatient Medications:    acetaminophen (TYLENOL) 500 MG tablet, Take 500 mg by mouth every 8 (eight) hours as needed for moderate pain., Disp: , Rfl:    albuterol (PROVENTIL HFA;VENTOLIN HFA) 108 (90 Base) MCG/ACT inhaler, Inhale 2 puffs into the lungs every 4 (four) hours as needed for wheezing or shortness of breath., Disp: , Rfl:    albuterol (PROVENTIL) (2.5 MG/3ML) 0.083% nebulizer solution, Take 2.5 mg by nebulization every 4 (four) hours as needed for wheezing or shortness of breath., Disp: ,  Rfl:    amLODipine (NORVASC) 10 MG tablet, Take 10 mg by mouth daily., Disp: , Rfl:    arformoterol (BROVANA) 15 MCG/2ML NEBU, Take 15 mcg by nebulization 2 (two) times daily., Disp: , Rfl:    budesonide (PULMICORT) 0.5 MG/2ML nebulizer solution, Take 0.5 mg by nebulization 2 (two) times daily., Disp: , Rfl:    cetirizine (ZYRTEC) 10 MG tablet, Chew 10 mg by mouth daily., Disp: , Rfl:    diclofenac Sodium (VOLTAREN) 1 % GEL, Apply 1 application topically 2 (two) times daily as needed (pain)., Disp: , Rfl:    fluticasone (FLONASE) 50 MCG/ACT nasal spray, Place 1 spray into both nostrils daily., Disp: , Rfl:    hydrochlorothiazide (HYDRODIURIL) 25 MG tablet, Take 25 mg by mouth daily., Disp: , Rfl:    lidocaine-prilocaine (EMLA) cream, Apply to affected area once, Disp: 30 g, Rfl: 3   Mepolizumab (NUCALA) 100 MG SOLR, Inject 100 mg into the skin every 30 (thirty) days., Disp: , Rfl:    montelukast (SINGULAIR) 10 MG tablet, Take 10 mg by  mouth at bedtime., Disp: , Rfl:    pantoprazole (PROTONIX) 40 MG tablet, Take 40 mg by mouth daily., Disp: , Rfl:    roflumilast (DALIRESP) 500 MCG TABS tablet, Take 500 mcg by mouth daily., Disp: , Rfl:    simvastatin (ZOCOR) 20 MG tablet, Take 20 mg by mouth daily., Disp: , Rfl:    tamsulosin (FLOMAX) 0.4 MG CAPS capsule, TAKE 1 CAPSULE BY MOUTH EVERY DAY, Disp: 30 capsule, Rfl: 0   tiotropium (SPIRIVA) 18 MCG inhalation capsule, Place 18 mcg into inhaler and inhale daily., Disp: , Rfl:    traMADol (ULTRAM) 50 MG tablet, Take 50 mg by mouth 2 (two) times daily., Disp: , Rfl:    LORazepam (ATIVAN) 0.5 MG tablet, Take 1 tablet (0.5 mg total) by mouth every 6 (six) hours as needed (Nausea or vomiting). (Patient not taking: No sig reported), Disp: 30 tablet, Rfl: 0   ondansetron (ZOFRAN) 8 MG tablet, TAKE 1 TABLET (8 MG TOTAL) BY MOUTH 2 (TWO) TIMES DAILY AS NEEDED (NAUSEA OR VOMITING). (Patient not taking: Reported on 10/23/2020), Disp: 30 tablet, Rfl: 1  Current  Facility-Administered Medications:    gemcitabine (GEMZAR) chemo syringe for bladder instillation 2,000 mg, 2,000 mg, Bladder Instillation, Once, Hollice Espy, MD  Physical exam:  Vitals:   10/23/20 0931  BP: 133/81  Pulse: 88  Resp: 17  Temp: (!) 97.4 F (36.3 C)  SpO2: 97%  Weight: 149 lb 1.6 oz (67.6 kg)  Height: 5\' 2"  (1.575 m)   Physical Exam Constitutional:      Comments: He is on home oxygen  Cardiovascular:     Rate and Rhythm: Normal rate and regular rhythm.     Heart sounds: Normal heart sounds.  Pulmonary:     Comments: Scattered bilateral wheezing Skin:    General: Skin is warm and dry.  Neurological:     Mental Status: He is alert and oriented to person, place, and time.     CMP Latest Ref Rng & Units 10/10/2020  Glucose 70 - 99 mg/dL 105(H)  BUN 8 - 23 mg/dL 10  Creatinine 0.61 - 1.24 mg/dL 0.85  Sodium 135 - 145 mmol/L 139  Potassium 3.5 - 5.1 mmol/L 3.1(L)  Chloride 98 - 111 mmol/L 101  CO2 22 - 32 mmol/L 27  Calcium 8.9 - 10.3 mg/dL 8.5(L)  Total Protein 6.5 - 8.1 g/dL 6.9  Total Bilirubin 0.3 - 1.2 mg/dL 0.7  Alkaline Phos 38 - 126 U/L 68  AST 15 - 41 U/L 22  ALT 0 - 44 U/L 19   CBC Latest Ref Rng & Units 10/10/2020  WBC 4.0 - 10.5 K/uL 7.3  Hemoglobin 13.0 - 17.0 g/dL 12.9(L)  Hematocrit 39.0 - 52.0 % 38.5(L)  Platelets 150 - 400 K/uL 324    No images are attached to the encounter.  CT CHEST ABDOMEN PELVIS W CONTRAST  Result Date: 10/13/2020 CLINICAL DATA:  Restaging of bladder cancer in a 75 year old male. EXAM: CT CHEST, ABDOMEN, AND PELVIS WITH CONTRAST TECHNIQUE: Multidetector CT imaging of the chest, abdomen and pelvis was performed following the standard protocol during bolus administration of intravenous contrast. CONTRAST:  157mL OMNIPAQUE IOHEXOL 300 MG/ML  SOLN COMPARISON:  April 25, 2020 FINDINGS: CT CHEST FINDINGS Cardiovascular: RIGHT-sided Port-A-Cath terminates at the caval to atrial junction. Heart size is stable without  substantial pericardial fluid. Calcified coronary artery disease and generalized calcified atheromatous plaque of the thoracic aorta without aneurysm. Central pulmonary vasculature is normal caliber. Mediastinum/Nodes: No axillary, hilar, thoracic  inlet or mediastinal lymphadenopathy. Esophagus grossly normal. Lungs/Pleura: Pulmonary emphysema worse at the lung apices, moderate to marked in severity. Basilar atelectasis. Airways are patent. No effusion or sign of consolidation. No suspicious pulmonary nodule with calcified granulomata in the upper lobes. A 10 x 8 mm area of nodularity at the posterior RIGHT lung base with some surrounding septal thickening is a new finding compared to previous imaging from December of 2021. There is some bronchial wall thickening in there are inspissated secretions in lower lobe bronchi to the LEFT lung base. This persists on prone imaging. Musculoskeletal: See below for full details regarding musculoskeletal structures. CT ABDOMEN PELVIS FINDINGS Hepatobiliary: Tiny hypodensity in the RIGHT hepatic lobe inferiorly in hepatic subsegment VI likely unchanged since previous imaging. Signs of hepatic steatosis. The portal vein is patent. No pericholecystic stranding. Cholelithiasis. No biliary duct dilation. Pancreas: Normal, without mass, inflammation or ductal dilatation. Spleen: Spleen normal size and contour. Adrenals/Urinary Tract: Adrenal glands are normal. There is symmetric renal enhancement with renal cortical scarring and small low-density lesions in the bilateral kidneys that are unchanged since previous imaging no hydronephrosis. No perinephric stranding. Urinary bladder is collapsed with mild irregularity of the bladder wall but without discrete lesion on limited assessment due to under distension. Excretory images without signs of upper tract lesion or secondary sign of stricture. Stomach/Bowel: Stomach unremarkable. Small bowel without acute process. Cecum in the midline  as on the previous study may indicate cecal bascule though without signs of stranding or evidence of obstruction about the cecum or proximal colon. Diverticular disease of the sigmoid. Subtle surrounding stranding. No abscess. No free intraperitoneal air. Findings are quite subtle but with some increased thickening since the most recent comparison evaluation. Appendix not visualized though there are no secondary signs to suggest acute appendicitis. Vascular/Lymphatic: Calcified atheromatous plaque of the abdominal aorta with normal caliber. Smooth contour of the IVC. There is no gastrohepatic or hepatoduodenal ligament lymphadenopathy. No retroperitoneal or mesenteric lymphadenopathy. No pelvic sidewall lymphadenopathy. Reproductive: Unremarkable by CT. Other: No ascites.  No free air. Musculoskeletal: Spinal degenerative changes. No acute or destructive bone process. IMPRESSION: 1. No visible bladder lesion or upper tract lesion. No signs of metastatic disease in the chest, abdomen or pelvis. 2. A 10 x 8 mm area of nodularity at the posterior RIGHT lung base with some surrounding septal thickening is a new finding compared to previous imaging from December of 2021. Some findings with respect to bronchi subtending this area suggests this may be inflammatory perhaps related even to aspiration change. Would suggest 8-12 week follow-up and correlate with any signs of respiratory illness. 3. Mild stranding about the sigmoid in the pelvis, this could relate to ongoing radiotherapy or low level diverticulitis. Correlate with any pelvic symptoms. 4. Cholelithiasis. 5. Signs of hepatic steatosis. 6. Calcified coronary artery disease and aortic atherosclerosis. 7. Emphysema and aortic atherosclerosis. Aortic Atherosclerosis (ICD10-I70.0) and Emphysema (ICD10-J43.9). Electronically Signed   By: Zetta Bills M.D.   On: 10/13/2020 16:51     Assessment and plan- Patient is a 75 y.o. male  with high-grade urothelial  carcinoma stage II T2 N0 M0.  He is s/p concurrent chemoradiation.  He is here to discuss CT scan results and further management  I have reviewed CT chest abdomen pelvis with contrast images independently and discussed findings with the patient.After concurrent chemoradiation there is presently no evidence of recurrent or progressive disease.  Bladder was underdistended and a definite bladder mass could not be seen.  However he  is seeing Dr. Erlene Quan next month and will undergo cystoscopy.    Patient was also noted to have a nodularity in the right posterior aspect of the lung measuring 10 x 8 mm.  Patient reports having symptoms of cough with mucoid expectoration about 10 days ago but presently feels back to his baseline.  I therefore do not feel that I need to give him any antibiotics for this.  However I would like to follow-up on these lung findings with a repeat chest x-ray in 3 months.  He will be seen by NP at that time.   Hypokalemia noted on labs a week ago: He was given oral potassium.   I will see him back in 6 months with repeat CT chest abdomen and pelvis without contrast   Visit Diagnosis 1. Malignant neoplasm of overlapping sites of bladder ([place])   2. Abnormal CT of the chest   3. Other emphysema ([place])   4. Bilateral atelectasis      Dr. Randa Evens, MD, MPH [facility] at [employer] [phone] 10/23/2020 4:31 PM

## 2020-11-02 ENCOUNTER — Encounter: Payer: Self-pay | Admitting: Oncology

## 2020-11-11 ENCOUNTER — Other Ambulatory Visit: Payer: Self-pay | Admitting: Urology

## 2020-11-14 ENCOUNTER — Telehealth: Payer: Medicare HMO

## 2020-11-29 ENCOUNTER — Encounter: Payer: Self-pay | Admitting: Urology

## 2020-11-29 ENCOUNTER — Ambulatory Visit (INDEPENDENT_AMBULATORY_CARE_PROVIDER_SITE_OTHER): Payer: Medicare HMO | Admitting: Urology

## 2020-11-29 ENCOUNTER — Other Ambulatory Visit: Payer: Self-pay

## 2020-11-29 VITALS — BP 136/76 | HR 76 | Ht 62.0 in | Wt 149.0 lb

## 2020-11-29 DIAGNOSIS — C674 Malignant neoplasm of posterior wall of bladder: Secondary | ICD-10-CM | POA: Diagnosis not present

## 2020-11-30 NOTE — Progress Notes (Signed)
   11/29/20  CC:  Chief Complaint  Patient presents with   Cysto    HPI: 75 year old male with muscle invasive bladder cancer status postradiation therapy who presents today for cystoscopic surveillance.  He is a to the operating room for TURBT on 05/22/2020 at which time several overlapping posterior bladder wall tumors were identified, at least 2.5 cm as well as a more worrisome irregular nodular tumor in the left lateral bladder wall which was highly suspicious for muscle invasive cancer.  He is now status post chemo 5-FU / radiation.  He reports today that he did fairly well with treatment.  He has absolutely no urinary complaints today.  He is voiding well without dysuria or gross hematuria.  Most recent CT scan 10/13/20 NED in the abdomen pelvis and a 10 mm area of nodularity in the right lung base felt to be possibly infectious.  He he continues to have multiple medical comorbidities including COPD on home O2.    Blood pressure 136/76, pulse 76, height 5\' 2"  (1.575 m), weight 149 lb (67.6 kg). NED. A&Ox3.   No respiratory distress   Abd soft, NT, ND Normal phallus with bilateral descended testicles  Cystoscopy Procedure Note  Patient identification was confirmed, informed consent was obtained, and patient was prepped using Betadine solution.  Lidocaine jelly was administered per urethral meatus.     Pre-Procedure: - Inspection reveals a normal caliber ureteral meatus.  Procedure: The flexible cystoscope was introduced without difficulty - No urethral strictures/lesions are present. - Enlarged prostate bilobar coaptation - Normal bladder neck - Bilateral ureteral orifices identified - Bladder mucosa  reveals no ulcers, tumors, or lesions.   Stellate scar posterior bladder wall appreciated.  Retroflexion shows unremarkable   Post-Procedure: - Patient tolerated the procedure well  Assessment/ Plan:  1. Malignant neoplasm of posterior wall of urinary bladder  (HCC) S/p modal therapy with TURBT, chemo, and rads  CT scan and cystoscopy today unremarkable, NED  Continue fairly close surveillance cystoscopies, generally would recommend every 3 months but given his medical comorbidities, we will push that out to every 4 months as long as continued serial cross-sectional imaging facilitated by Dr. Janese Banks - Urinalysis, Complete   Hollice Espy, MD

## 2020-12-01 LAB — URINALYSIS, COMPLETE
Bilirubin, UA: NEGATIVE
Glucose, UA: NEGATIVE
Ketones, UA: NEGATIVE
Leukocytes,UA: NEGATIVE
Nitrite, UA: NEGATIVE
Protein,UA: NEGATIVE
RBC, UA: NEGATIVE
Specific Gravity, UA: 1.015 (ref 1.005–1.030)
Urobilinogen, Ur: 0.2 mg/dL (ref 0.2–1.0)
pH, UA: 5.5 (ref 5.0–7.5)

## 2020-12-01 LAB — MICROSCOPIC EXAMINATION
Bacteria, UA: NONE SEEN
Epithelial Cells (non renal): NONE SEEN /hpf (ref 0–10)

## 2020-12-05 ENCOUNTER — Other Ambulatory Visit: Payer: Medicare HMO | Admitting: Urology

## 2020-12-09 ENCOUNTER — Other Ambulatory Visit: Payer: Self-pay | Admitting: Urology

## 2020-12-25 ENCOUNTER — Telehealth (INDEPENDENT_AMBULATORY_CARE_PROVIDER_SITE_OTHER): Payer: Self-pay | Admitting: Gastroenterology

## 2020-12-25 ENCOUNTER — Other Ambulatory Visit: Payer: Self-pay

## 2020-12-25 DIAGNOSIS — Z1211 Encounter for screening for malignant neoplasm of colon: Secondary | ICD-10-CM

## 2020-12-25 MED ORDER — NA SULFATE-K SULFATE-MG SULF 17.5-3.13-1.6 GM/177ML PO SOLN: 1.0000 | Freq: Once | ORAL | 0 refills | Status: AC

## 2020-12-25 NOTE — Progress Notes (Signed)
Gastroenterology Pre-Procedure Review  Request Date: 01/16/2021 Requesting Physician: Dr. Vicente Males  PATIENT REVIEW QUESTIONS: The patient responded to the following health history questions as indicated:    1. Are you having any GI issues? no 2. Do you have a personal history of Polyps? no 3. Do you have a family history of Colon Cancer or Polyps? no 4. Diabetes Mellitus? no 5. Joint replacements in the past 12 months?no 6. Major health problems in the past 3 months?no 7. Any artificial heart valves, MVP, or defibrillator?no    MEDICATIONS & ALLERGIES:    Patient reports the following regarding taking any anticoagulation/antiplatelet therapy:   Plavix, Coumadin, Eliquis, Xarelto, Lovenox, Pradaxa, Brilinta, or Effient? no Aspirin? no  Patient confirms/reports the following medications:  Current Outpatient Medications  Medication Sig Dispense Refill   acetaminophen (TYLENOL) 500 MG tablet Take 500 mg by mouth every 8 (eight) hours as needed for moderate pain.     albuterol (PROVENTIL HFA;VENTOLIN HFA) 108 (90 Base) MCG/ACT inhaler Inhale 2 puffs into the lungs every 4 (four) hours as needed for wheezing or shortness of breath.     albuterol (PROVENTIL) (2.5 MG/3ML) 0.083% nebulizer solution Take 2.5 mg by nebulization every 4 (four) hours as needed for wheezing or shortness of breath.     amLODipine (NORVASC) 10 MG tablet Take 10 mg by mouth daily.     arformoterol (BROVANA) 15 MCG/2ML NEBU Take 15 mcg by nebulization 2 (two) times daily.     budesonide (PULMICORT) 0.5 MG/2ML nebulizer solution Take 0.5 mg by nebulization 2 (two) times daily.     cetirizine (ZYRTEC) 10 MG tablet Chew 10 mg by mouth daily.     diclofenac Sodium (VOLTAREN) 1 % GEL Apply 1 application topically 2 (two) times daily as needed (pain).     fluticasone (FLONASE) 50 MCG/ACT nasal spray Place 1 spray into both nostrils daily.     hydrochlorothiazide (HYDRODIURIL) 25 MG tablet Take 25 mg by mouth daily.      lidocaine-prilocaine (EMLA) cream Apply to affected area once 30 g 3   Mepolizumab (NUCALA) 100 MG SOLR Inject 100 mg into the skin every 30 (thirty) days.     montelukast (SINGULAIR) 10 MG tablet Take 10 mg by mouth at bedtime.     Na Sulfate-K Sulfate-Mg Sulf 17.5-3.13-1.6 GM/177ML SOLN Take 1 kit by mouth once for 1 dose. 354 mL 0   pantoprazole (PROTONIX) 40 MG tablet Take 40 mg by mouth daily.     roflumilast (DALIRESP) 500 MCG TABS tablet Take 500 mcg by mouth daily.     simvastatin (ZOCOR) 20 MG tablet Take 20 mg by mouth daily.     tamsulosin (FLOMAX) 0.4 MG CAPS capsule TAKE 1 CAPSULE BY MOUTH EVERY DAY 30 capsule 0   tiotropium (SPIRIVA) 18 MCG inhalation capsule Place 18 mcg into inhaler and inhale daily.     traMADol (ULTRAM) 50 MG tablet Take 50 mg by mouth 2 (two) times daily.     Current Facility-Administered Medications  Medication Dose Route Frequency Provider Last Rate Last Admin   gemcitabine (GEMZAR) chemo syringe for bladder instillation 2,000 mg  2,000 mg Bladder Instillation Once Hollice Espy, MD        Patient confirms/reports the following allergies:  Allergies  Allergen Reactions   Shrimp [Shellfish Allergy] Other (See Comments)    Per MD comments critical    No orders of the defined types were placed in this encounter.   AUTHORIZATION INFORMATION Primary Insurance: 1D#: Group #:  Secondary  Insurance: 1D#: Group #:  SCHEDULE INFORMATION: Date: 01/16/2021 Time: Location: [place]

## 2021-01-06 ENCOUNTER — Other Ambulatory Visit: Payer: Self-pay | Admitting: Urology

## 2021-01-16 ENCOUNTER — Ambulatory Visit
Admission: RE | Admit: 2021-01-16 | Discharge: 2021-01-16 | Disposition: A | Discharge status: Home / Self Care | Payer: Medicare HMO | Attending: Gastroenterology | Admitting: Gastroenterology

## 2021-01-16 ENCOUNTER — Ambulatory Visit: Payer: Medicare HMO | Admitting: Anesthesiology

## 2021-01-16 ENCOUNTER — Encounter
Admission: RE | Disposition: A | Discharge status: Home / Self Care | Payer: Self-pay | Source: Home / Self Care | Attending: Gastroenterology

## 2021-01-16 DIAGNOSIS — Z8551 Personal history of malignant neoplasm of bladder: Secondary | ICD-10-CM | POA: Diagnosis not present

## 2021-01-16 DIAGNOSIS — Z7951 Long term (current) use of inhaled steroids: Secondary | ICD-10-CM | POA: Diagnosis not present

## 2021-01-16 DIAGNOSIS — Z1211 Encounter for screening for malignant neoplasm of colon: Secondary | ICD-10-CM | POA: Diagnosis not present

## 2021-01-16 DIAGNOSIS — K5730 Diverticulosis of large intestine without perforation or abscess without bleeding: Secondary | ICD-10-CM | POA: Insufficient documentation

## 2021-01-16 DIAGNOSIS — Z87891 Personal history of nicotine dependence: Secondary | ICD-10-CM | POA: Diagnosis not present

## 2021-01-16 DIAGNOSIS — Z91013 Allergy to seafood: Secondary | ICD-10-CM | POA: Insufficient documentation

## 2021-01-16 DIAGNOSIS — K635 Polyp of colon: Secondary | ICD-10-CM

## 2021-01-16 DIAGNOSIS — Z79891 Long term (current) use of opiate analgesic: Secondary | ICD-10-CM | POA: Diagnosis not present

## 2021-01-16 DIAGNOSIS — K640 First degree hemorrhoids: Secondary | ICD-10-CM | POA: Diagnosis not present

## 2021-01-16 DIAGNOSIS — Z79899 Other long term (current) drug therapy: Secondary | ICD-10-CM | POA: Insufficient documentation

## 2021-01-16 DIAGNOSIS — D122 Benign neoplasm of ascending colon: Secondary | ICD-10-CM | POA: Diagnosis not present

## 2021-01-16 DIAGNOSIS — K621 Rectal polyp: Secondary | ICD-10-CM | POA: Insufficient documentation

## 2021-01-16 DIAGNOSIS — Z9981 Dependence on supplemental oxygen: Secondary | ICD-10-CM | POA: Insufficient documentation

## 2021-01-16 HISTORY — PX: COLONOSCOPY WITH PROPOFOL: SHX5780

## 2021-01-16 LAB — GLUCOSE, CAPILLARY: Glucose-Capillary: 99 mg/dL (ref 70–99)

## 2021-01-16 SURGERY — COLONOSCOPY WITH PROPOFOL
Anesthesia: General

## 2021-01-16 MED ORDER — PROPOFOL 500 MG/50ML IV EMUL
INTRAVENOUS | Status: DC | PRN
  Administered 2021-01-16: 120 ug/kg/min via INTRAVENOUS

## 2021-01-16 MED ORDER — PROPOFOL 10 MG/ML IV BOLUS
INTRAVENOUS | Status: DC | PRN
  Administered 2021-01-16: 20 mg via INTRAVENOUS
  Administered 2021-01-16: 50 mg via INTRAVENOUS

## 2021-01-16 MED ORDER — SODIUM CHLORIDE 0.9 % IV SOLN: INTRAVENOUS | Status: DC

## 2021-01-16 MED ORDER — LIDOCAINE 2% (20 MG/ML) 5 ML SYRINGE
INTRAMUSCULAR | Status: DC | PRN
  Administered 2021-01-16: 20 mg via INTRAVENOUS

## 2021-01-16 NOTE — Anesthesia Preprocedure Evaluation (Signed)
Anesthesia Evaluation  Patient identified by MRN, date of birth, ID band Patient awake    Reviewed: Allergy & Precautions, NPO status , Patient's Chart, lab work & pertinent test results  History of Anesthesia Complications Negative for: history of anesthetic complications  Airway Mallampati: III  TM Distance: <3 FB Neck ROM: full    Dental  (+) Poor Dentition, Missing   Pulmonary shortness of breath and with exertion, asthma , sleep apnea , COPD,  COPD inhaler and oxygen dependent, former smoker,    Pulmonary exam normal        Cardiovascular hypertension, (-) anginaNormal cardiovascular exam     Neuro/Psych  Neuromuscular disease negative psych ROS   GI/Hepatic Neg liver ROS, GERD  Medicated and Controlled,  Endo/Other  negative endocrine ROS  Renal/GU negative Renal ROS  negative genitourinary   Musculoskeletal   Abdominal   Peds  Hematology negative hematology ROS (+)   Anesthesia Other Findings Past Medical History: No date: Arthritis No date: Asthma No date: Bladder cancer ([place]) No date: Cancer ([place]) No date: COPD (chronic obstructive pulmonary disease) (HCC) No date: Dyspnea No date: GERD (gastroesophageal reflux disease) No date: Hypertension  Past Surgical History: No date: COLONOSCOPY 2017: HERNIA REPAIR 06/19/2020: PORTA CATH INSERTION; N/A     Comment:  Procedure: PORTA CATH INSERTION;  Surgeon: Algernon Huxley,              MD;  Location: [place] [facility];  Service:               Cardiovascular;  Laterality: N/A; 05/22/2020: TRANSURETHRAL RESECTION OF BLADDER TUMOR WITH MITOMYCIN-C;  N/A     Comment:  Procedure: TRANSURETHRAL RESECTION OF BLADDER TUMOR WITH              Gemcitabine;  Surgeon: Hollice Espy, MD;  Location:               [facility];  Service: Urology;  Laterality: N/A;  BMI    Body Mass Index: 24.12 kg/m      Reproductive/Obstetrics negative OB ROS                              Anesthesia Physical Anesthesia Plan  ASA: 3  Anesthesia Plan: General   Post-op Pain Management:    Induction: Intravenous  PONV Risk Score and Plan: Propofol infusion and TIVA  Airway Management Planned: Natural Airway and Nasal Cannula  Additional Equipment:   Intra-op Plan:   Post-operative Plan:   Informed Consent: I have reviewed the patients History and Physical, chart, labs and discussed the procedure including the risks, benefits and alternatives for the proposed anesthesia with the patient or authorized representative who has indicated his/her understanding and acceptance.     Dental Advisory Given and Interpreter used for interveiw  Plan Discussed with: Anesthesiologist, CRNA and Surgeon  Anesthesia Plan Comments: (Patient consented for risks of anesthesia including but not limited to:  - adverse reactions to medications - risk of airway placement if required - damage to eyes, teeth, lips or other oral mucosa - nerve damage due to positioning  - sore throat or hoarseness - Damage to heart, brain, nerves, lungs, other parts of body or loss of life  Patient voiced understanding.)        Anesthesia Quick Evaluation

## 2021-01-16 NOTE — H&P (Signed)
[name], MD [address], [place], [place], [place], [phone], [place], [place], [place], [address] Phone: [phone]  Fax: [phone]  Primary Care Physician:  [facility]   Pre-Procedure History & Physical: HPI:  Joe Torres is a 75 y.o. male is here for an colonoscopy.   Past Medical History:  Diagnosis Date   Arthritis    Asthma    Bladder cancer ([facility])    Cancer ([facility])    COPD (chronic obstructive pulmonary disease) (HCC)    Dyspnea    GERD (gastroesophageal reflux disease)    Hypertension     Past Surgical History:  Procedure Laterality Date   COLONOSCOPY     HERNIA REPAIR  2017   PORTA CATH INSERTION N/A 06/19/2020   Procedure: PORTA CATH INSERTION;  Surgeon: Algernon Huxley, MD;  Location: [facility];  Service: Cardiovascular;  Laterality: N/A;   TRANSURETHRAL RESECTION OF BLADDER TUMOR WITH MITOMYCIN-C N/A 05/22/2020   Procedure: TRANSURETHRAL RESECTION OF BLADDER TUMOR WITH Gemcitabine;  Surgeon: Hollice Espy, MD;  Location: [facility];  Service: Urology;  Laterality: N/A;    Prior to Admission medications   Medication Sig Start Date End Date Taking? Authorizing Provider  albuterol (PROVENTIL HFA;VENTOLIN HFA) 108 (90 Base) MCG/ACT inhaler Inhale 2 puffs into the lungs every 4 (four) hours as needed for wheezing or shortness of breath.   Yes [provider]  amLODipine (NORVASC) 10 MG tablet Take 10 mg by mouth daily.   Yes [provider]  arformoterol (BROVANA) 15 MCG/2ML NEBU Take 15 mcg by nebulization 2 (two) times daily.   Yes [provider]  budesonide (PULMICORT) 0.5 MG/2ML nebulizer solution Take 0.5 mg by nebulization 2 (two) times daily.   Yes [provider]  tiotropium (SPIRIVA) 18 MCG inhalation capsule Place 18 mcg into inhaler and inhale daily.   Yes [provider]  traMADol (ULTRAM) 50 MG tablet Take 50 mg by mouth 2 (two) times daily.   Yes  [provider]  acetaminophen (TYLENOL) 500 MG tablet Take 500 mg by mouth every 8 (eight) hours as needed for moderate pain.    [provider]  albuterol (PROVENTIL) (2.5 MG/3ML) 0.083% nebulizer solution Take 2.5 mg by nebulization every 4 (four) hours as needed for wheezing or shortness of breath.    [provider]  cetirizine (ZYRTEC) 10 MG tablet Chew 10 mg by mouth daily.    [provider]  diclofenac Sodium (VOLTAREN) 1 % GEL Apply 1 application topically 2 (two) times daily as needed (pain).    [provider]  fluticasone (FLONASE) 50 MCG/ACT nasal spray Place 1 spray into both nostrils daily.    [provider]  hydrochlorothiazide (HYDRODIURIL) 25 MG tablet Take 25 mg by mouth daily.    [provider]  lidocaine-prilocaine (EMLA) cream Apply to affected area once 06/18/20   Sindy Guadeloupe, MD  Mepolizumab (NUCALA) 100 MG SOLR Inject 100 mg into the skin every 30 (thirty) days.    [provider]  montelukast (SINGULAIR) 10 MG tablet Take 10 mg by mouth at bedtime.    [provider]  pantoprazole (PROTONIX) 40 MG tablet Take 40 mg by mouth daily.    [provider]  roflumilast (DALIRESP) 500 MCG TABS tablet Take 500 mcg by mouth daily.    [provider]  simvastatin (ZOCOR) 20 MG tablet Take 20 mg by mouth daily.  [provider]  tamsulosin (FLOMAX) 0.4 MG CAPS capsule TAKE 1 CAPSULE BY MOUTH EVERY DAY 01/08/21   McGowan, Larene Beach A, PA-C    Allergies as of 12/25/2020 - Review Complete 12/25/2020  Allergen Reaction Noted   Shrimp [shellfish allergy] Other (See Comments) 10/28/2017    No family history on file.  Social History   Socioeconomic History   Marital status: Married    Spouse name: Not on file   Number of children: Not on file   Years of education: Not on file   Highest education level: Not on file  Occupational History   Not on file  Tobacco Use    Smoking status: Former    Packs/day: 1.50    Years: 40.00    Pack years: 60.00    Types: Cigarettes    Quit date: 05/21/2008    Years since quitting: 12.6   Smokeless tobacco: Never  Vaping Use   Vaping Use: Never used  Substance and Sexual Activity   Alcohol use: Not Currently   Drug use: Never   Sexual activity: Not Currently  Other Topics Concern   Not on file  Social History Narrative   Not on file   Social Determinants of Health   Financial Resource Strain: Not on file  Food Insecurity: Not on file  Transportation Needs: Not on file  Physical Activity: Not on file  Stress: Not on file  Social Connections: Not on file  Intimate Partner Violence: Not on file    Review of Systems: See HPI, otherwise negative ROS  Physical Exam: BP (!) 153/88   Pulse 95   Temp (!) 96.9 F (36.1 C) (Temporal)   Resp 20   Ht '5\' 7"'$  (1.702 m)   Wt 69.9 kg   SpO2 96%   BMI 24.12 kg/m  General:   Alert,  pleasant and cooperative in NAD Head:  Normocephalic and atraumatic. Neck:  Supple; no masses or thyromegaly. Lungs:  Clear throughout to auscultation, normal respiratory effort.    Heart:  +S1, +S2, Regular rate and rhythm, No edema. Abdomen:  Soft, nontender and nondistended. Normal bowel sounds, without guarding, and without rebound.   Neurologic:  Alert and  oriented x4;  grossly normal neurologically.  Impression/Plan: Joe Torres is here for an colonoscopy to be performed for Screening colonoscopy average risk   Risks, benefits, limitations, and alternatives regarding  colonoscopy have been reviewed with the patient.  Questions have been answered.  All parties agreeable.   [name], MD  01/16/2021, 9:09 AM

## 2021-01-16 NOTE — Anesthesia Postprocedure Evaluation (Signed)
Anesthesia Post Note  Patient: Joe Torres  Procedure(s) Performed: COLONOSCOPY WITH PROPOFOL  Patient location during evaluation: Endoscopy Anesthesia Type: General Level of consciousness: awake and alert Pain management: pain level controlled Vital Signs Assessment: post-procedure vital signs reviewed and stable Respiratory status: spontaneous breathing, nonlabored ventilation, respiratory function stable and patient connected to nasal cannula oxygen Cardiovascular status: blood pressure returned to baseline and stable Postop Assessment: no apparent nausea or vomiting Anesthetic complications: no   No notable events documented.   Last Vitals:  Vitals:   01/16/21 0958 01/16/21 1008  BP: 135/86 130/89  Pulse: 78 79  Resp: 15 14  Temp:    SpO2: 99% 98%    Last Pain:  Vitals:   01/16/21 1008  TempSrc:   PainSc: 0-No pain                 Precious Haws Stevan Eberwein

## 2021-01-16 NOTE — Op Note (Signed)
[facility] Gastroenterology Patient Name: Joe Torres Procedure Date: 01/16/2021 9:22 AM MRN: [id number] Account #: [id number] Date of Birth: 1946-01-26 Admit Type: Outpatient Age: 75 Room: [facility] ENDO ROOM 2 Gender: Male Note Status: Finalized Instrument Name: Jasper Riling [id number] Procedure:             Colonoscopy Indications:           Screening for colorectal malignant neoplasm Providers:             [name] MD, MD Referring MD:          No Local Md, MD (Referring MD) Medicines:             Monitored Anesthesia Care Complications:         No immediate complications. Procedure:             Pre-Anesthesia Assessment:                        - Prior to the procedure, a History and Physical was                         performed, and patient medications, allergies and                         sensitivities were reviewed. The patient's tolerance                         of previous anesthesia was reviewed.                        - The risks and benefits of the procedure and the                         sedation options and risks were discussed with the                         patient. All questions were answered and informed                         consent was obtained.                        - ASA Grade Assessment: II - A patient with mild                         systemic disease.                        After obtaining informed consent, the colonoscope was                         passed under direct vision. Throughout the procedure,                         the patient's blood pressure, pulse, and oxygen                         saturations were monitored continuously. The                         Colonoscope was  introduced through the anus and                         advanced to the the cecum, identified by the                         appendiceal orifice. The colonoscopy was performed                         with ease. The patient tolerated the procedure well.                          The quality of the bowel preparation was good. Findings:      The perianal and digital rectal examinations were normal.      A 8 mm polyp was found in the ascending colon. The polyp was sessile.       The polyp was removed with a cold snare. Resection and retrieval were       complete. To prevent bleeding after the polypectomy, one hemostatic clip       was successfully placed. There was no bleeding at the end of the       procedure.      A 5 mm polyp was found in the rectum. The polyp was sessile. The polyp       was removed with a cold snare. Resection and retrieval were complete.      Multiple small-mouthed diverticula were found in the sigmoid colon.      Non-bleeding internal hemorrhoids were found during retroflexion. The       hemorrhoids were large and Grade I (internal hemorrhoids that do not       prolapse).      The exam was otherwise without abnormality on direct and retroflexion       views. Impression:            - One 8 mm polyp in the ascending colon, removed with                         a cold snare. Resected and retrieved. Clip was placed.                        - One 5 mm polyp in the rectum, removed with a cold                         snare. Resected and retrieved.                        - Diverticulosis in the sigmoid colon.                        - Non-bleeding internal hemorrhoids.                        - The examination was otherwise normal on direct and                         retroflexion views. Recommendation:        - Discharge patient to home (with escort).                        -  Resume previous diet.                        - Continue present medications.                        - Await pathology results.                        - Repeat colonoscopy for surveillance based on                         pathology results.                        - Return to GI office PRN. Procedure Code(s):     --- Professional ---                        [phone],  Colonoscopy, flexible; with removal of                         tumor(s), polyp(s), or other lesion(s) by snare                         technique Diagnosis Code(s):     --- Professional ---                        Z12.11, Encounter for screening for malignant neoplasm                         of colon                        K63.5, Polyp of colon                        K62.1, Rectal polyp                        K64.0, First degree hemorrhoids                        K57.30, Diverticulosis of large intestine without                         perforation or abscess without bleeding CPT copyright 2019 American Medical Association. All rights reserved. The codes documented in this report are preliminary and upon coder review may  be revised to meet current compliance requirements. [name], MD [name] MD, MD 01/16/2021 9:45:14 AM This report has been signed electronically. Number of Addenda: 0 Note Initiated On: 01/16/2021 9:22 AM Scope Withdrawal Time: 0 hours 11 minutes 16 seconds  Total Procedure Duration: 0 hours 16 minutes 29 seconds  Estimated Blood Loss:  Estimated blood loss: none.      [facility]

## 2021-01-16 NOTE — Transfer of Care (Signed)
Immediate Anesthesia Transfer of Care Note  Patient: Joe Torres  Procedure(s) Performed: COLONOSCOPY WITH PROPOFOL  Patient Location: PACU  Anesthesia Type:General  Level of Consciousness: drowsy  Airway & Oxygen Therapy: Patient Spontanous Breathing and Patient connected to face mask oxygen  Post-op Assessment: Report given to RN and Post -op Vital signs reviewed and stable  Post vital signs: Reviewed  Last Vitals:  Vitals Value Taken Time  BP 115/80 01/16/21 0947  Temp    Pulse 88 01/16/21 0947  Resp 15 01/16/21 0947  SpO2 98 % 01/16/21 0947  Vitals shown include unvalidated device data.  Last Pain:  Vitals:   01/16/21 0847  TempSrc: Temporal  PainSc: 0-No pain         Complications: No notable events documented.

## 2021-01-17 ENCOUNTER — Encounter: Payer: Self-pay | Admitting: Gastroenterology

## 2021-01-17 LAB — SURGICAL PATHOLOGY

## 2021-01-18 ENCOUNTER — Encounter: Payer: Self-pay | Admitting: Gastroenterology

## 2021-01-22 ENCOUNTER — Ambulatory Visit
Admission: RE | Admit: 2021-01-22 | Discharge: 2021-01-22 | Disposition: A | Discharge status: Home / Self Care | Payer: Medicare HMO | Attending: Oncology | Admitting: Oncology

## 2021-01-22 ENCOUNTER — Ambulatory Visit
Admission: RE | Admit: 2021-01-22 | Discharge: 2021-01-22 | Disposition: A | Discharge status: Home / Self Care | Payer: Medicare HMO | Source: Ambulatory Visit | Attending: Oncology | Admitting: Oncology

## 2021-01-22 DIAGNOSIS — C678 Malignant neoplasm of overlapping sites of bladder: Secondary | ICD-10-CM | POA: Insufficient documentation

## 2021-01-22 DIAGNOSIS — J9811 Atelectasis: Secondary | ICD-10-CM

## 2021-01-22 DIAGNOSIS — J438 Other emphysema: Secondary | ICD-10-CM | POA: Diagnosis present

## 2021-01-22 DIAGNOSIS — R9389 Abnormal findings on diagnostic imaging of other specified body structures: Secondary | ICD-10-CM

## 2021-01-23 ENCOUNTER — Inpatient Hospital Stay (HOSPITAL_BASED_OUTPATIENT_CLINIC_OR_DEPARTMENT_OTHER): Payer: Medicare HMO | Admitting: Oncology

## 2021-01-23 ENCOUNTER — Inpatient Hospital Stay: Payer: Medicare HMO | Attending: Oncology

## 2021-01-23 ENCOUNTER — Other Ambulatory Visit: Payer: Self-pay

## 2021-01-23 VITALS — BP 145/89 | HR 91 | Temp 98.4°F | Resp 18 | Wt 150.3 lb

## 2021-01-23 DIAGNOSIS — E876 Hypokalemia: Secondary | ICD-10-CM | POA: Diagnosis not present

## 2021-01-23 DIAGNOSIS — C678 Malignant neoplasm of overlapping sites of bladder: Secondary | ICD-10-CM | POA: Diagnosis present

## 2021-01-23 DIAGNOSIS — R9389 Abnormal findings on diagnostic imaging of other specified body structures: Secondary | ICD-10-CM

## 2021-01-23 DIAGNOSIS — Z87891 Personal history of nicotine dependence: Secondary | ICD-10-CM | POA: Insufficient documentation

## 2021-01-23 LAB — COMPREHENSIVE METABOLIC PANEL
ALT: 21 U/L (ref 0–44)
AST: 22 U/L (ref 15–41)
Albumin: 4.3 g/dL (ref 3.5–5.0)
Alkaline Phosphatase: 67 U/L (ref 38–126)
Anion gap: 9 (ref 5–15)
BUN: 18 mg/dL (ref 8–23)
CO2: 30 mmol/L (ref 22–32)
Calcium: 9 mg/dL (ref 8.9–10.3)
Chloride: 98 mmol/L (ref 98–111)
Creatinine, Ser: 0.89 mg/dL (ref 0.61–1.24)
GFR, Estimated: >60 mL/min (ref >60)
Glucose, Bld: 113 mg/dL — ABNORMAL HIGH (ref 70–99)
Potassium: 3.2 mmol/L — ABNORMAL LOW (ref 3.5–5.1)
Sodium: 137 mmol/L (ref 135–145)
Total Bilirubin: 0.9 mg/dL (ref 0.3–1.2)
Total Protein: 7.3 g/dL (ref 6.5–8.1)

## 2021-01-23 LAB — CBC WITH DIFFERENTIAL/PLATELET
Abs Immature Granulocytes: 0.08 10*3/uL — ABNORMAL HIGH (ref 0.00–0.07)
Basophils Absolute: 0 10*3/uL (ref 0.0–0.1)
Basophils Relative: 0 %
Eosinophils Absolute: 0 10*3/uL (ref 0.0–0.5)
Eosinophils Relative: 0 %
HCT: 42 % (ref 39.0–52.0)
Hemoglobin: 14.1 g/dL (ref 13.0–17.0)
Immature Granulocytes: 1 %
Lymphocytes Relative: 14 %
Lymphs Abs: 1.2 10*3/uL (ref 0.7–4.0)
MCH: 28.6 pg (ref 26.0–34.0)
MCHC: 33.6 g/dL (ref 30.0–36.0)
MCV: 85.2 fL (ref 80.0–100.0)
Monocytes Absolute: 0.9 10*3/uL (ref 0.1–1.0)
Monocytes Relative: 11 %
Neutro Abs: 6.2 10*3/uL (ref 1.7–7.7)
Neutrophils Relative %: 74 %
Platelets: 235 10*3/uL (ref 150–400)
RBC: 4.93 MIL/uL (ref 4.22–5.81)
RDW: 14.6 % (ref 11.5–15.5)
WBC: 8.4 10*3/uL (ref 4.0–10.5)
nRBC: 0 % (ref 0.0–0.2)

## 2021-01-23 MED ORDER — HEPARIN SOD (PORK) LOCK FLUSH 100 UNIT/ML IV SOLN
500.0000 [IU] | Freq: Once | INTRAVENOUS | Status: DC
  Filled 2021-01-23: qty 5

## 2021-01-23 MED ORDER — HEPARIN SOD (PORK) LOCK FLUSH 100 UNIT/ML IV SOLN
INTRAVENOUS | Status: AC
  Filled 2021-01-23: qty 5

## 2021-01-23 MED ORDER — POTASSIUM CHLORIDE CRYS ER 20 MEQ PO TBCR: 20.0000 meq | EXTENDED_RELEASE_TABLET | Freq: Every day | ORAL | 0 refills | Status: DC

## 2021-01-23 NOTE — Progress Notes (Signed)
Pt reports he feels better since last visit. Had CXR yesterday.

## 2021-01-23 NOTE — Progress Notes (Signed)
Hematology/Oncology Consult note [facility]  [phone] [phone]  Patient Care Team: [facility] 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  He presented with symptoms of gross hematuria and underwent CT urogram which showed 2 lesions in the bladder 1 measuring 2.1 x 2.1 cm in the posterior bladder wall as well as an eccentric mass just left of the midline measuring 1.6 x 1.1 cm.  Patient was seen by urology and underwent cystoscopy.  Visualization was limited due to gross hematuria.  Large at least 2 cm spherical high-grade appearing tumor in the midline posterior bladder wall.  This was followed by TURBT which showed a 2.5 cm posterior bladder tumor appearing necrotic/hemorrhagic another 2 cm nodular area which had a submucosal solid infiltrative appearance.  Significant bladder varices and hypervascularity surrounding tumors.  Pathology showed invasive urothelial carcinoma high-grade with high suspicion for muscularis propria involvement.  Scattered small fragments of muscularis propria identified an invasive tumor was seen adjacent to these muscle bundles.  However unequivocal muscle invasion was not readily apparent.  Patient could not urinate following his TURBT and required temporary placement of Foley catheter but was able to pass his trial of void later.  Risk of repeat TURBT was discussed and mutual consensus was to avoid another invasive procedure given his oxygen dependent COPD.  Case discussed at tumor board.  He was not deemed to be a good candidate  for cystoprostatectomy.  Based on the appearance of the tumor at the time of TURBT as well as pathology, it was deemed that there was a high risk of muscle invasion.   Patient completed concurrent chemoradiation to his bladder with 5-FU mitomycin regimen in March 2022.  Interval history-patient presents today with his daughter.  Overall he has been doing well.  He has baseline exertional shortness of breath and is on 24/7 oxygen.  He denies any problems with his bowel movements.  Denies any blood in his urine.  Has occasional leg cramping.  He has not taking potassium supplements.  He had a colonoscopy earlier this month.  2 polyps were removed and were negative for dysplasia and malignancy.   ECOG PS- 1 Pain scale- 0   Review of systems- Review of Systems  Constitutional:  Positive for malaise/fatigue.  Respiratory:  Positive for shortness of breath.   Musculoskeletal:        Occasional leg cramps  Neurological:  Positive for weakness.  All other systems reviewed and are negative.    Allergies  Allergen Reactions   Shrimp [Shellfish Allergy] Other (See Comments)    Per MD comments critical     Past Medical History:  Diagnosis Date   Arthritis    Asthma    Bladder cancer ([place])    Cancer ([place])    COPD (chronic obstructive pulmonary disease) (HCC)    Dyspnea    GERD (gastroesophageal reflux disease)    Hypertension      Past Surgical History:  Procedure Laterality Date   COLONOSCOPY     COLONOSCOPY WITH PROPOFOL N/A 01/16/2021   Procedure: COLONOSCOPY WITH PROPOFOL;  Surgeon:  [name], MD;  Location: [facility];  Service: Gastroenterology;  Laterality: N/A;  SPANISH INTERPRETER 9 AM ARRIVAL REQUEST   HERNIA REPAIR  2017   PORTA CATH INSERTION N/A 06/19/2020   Procedure: PORTA CATH INSERTION;  Surgeon: Algernon Huxley, MD;  Location: [facility];  Service: Cardiovascular;  Laterality: N/A;   TRANSURETHRAL RESECTION OF BLADDER TUMOR WITH MITOMYCIN-C N/A 05/22/2020    Procedure: TRANSURETHRAL RESECTION OF BLADDER TUMOR WITH Gemcitabine;  Surgeon: Hollice Espy, MD;  Location: [facility];  Service: Urology;  Laterality: N/A;    Social History   Socioeconomic History   Marital status: Married    Spouse name: Not on file   Number of children: Not on file   Years of education: Not on file   Highest education level: Not on file  Occupational History   Not on file  Tobacco Use   Smoking status: Former    Packs/day: 1.50    Years: 40.00    Pack years: 60.00    Types: Cigarettes    Quit date: 05/21/2008    Years since quitting: 12.6   Smokeless tobacco: Never  Vaping Use   Vaping Use: Never used  Substance and Sexual Activity   Alcohol use: Not Currently   Drug use: Never   Sexual activity: Not Currently  Other Topics Concern   Not on file  Social History Narrative   Not on file   Social Determinants of Health   Financial Resource Strain: Not on file  Food Insecurity: Not on file  Transportation Needs: Not on file  Physical Activity: Not on file  Stress: Not on file  Social Connections: Not on file  Intimate Partner Violence: Not on file    No family history on file.   Current Outpatient Medications:    potassium chloride SA (KLOR-CON) 20 MEQ tablet, Take 1 tablet (20 mEq total) by mouth daily., Disp: 14 tablet, Rfl: 0   acetaminophen (TYLENOL) 500 MG tablet, Take 500 mg by mouth every 8 (eight) hours as needed for moderate pain., Disp: , Rfl:    albuterol (PROVENTIL HFA;VENTOLIN HFA) 108 (90 Base) MCG/ACT inhaler, Inhale 2 puffs into the lungs every 4 (four) hours as needed for wheezing or shortness of breath., Disp: , Rfl:    albuterol (PROVENTIL) (2.5 MG/3ML) 0.083% nebulizer solution, Take 2.5 mg by nebulization every 4 (four) hours as needed for wheezing or shortness of breath., Disp: , Rfl:    amLODipine (NORVASC) 10 MG tablet, Take 10 mg by mouth daily., Disp: , Rfl:    arformoterol (BROVANA) 15 MCG/2ML NEBU, Take 15 mcg by  nebulization 2 (two) times daily., Disp: , Rfl:    budesonide (PULMICORT) 0.5 MG/2ML nebulizer solution, Take 0.5 mg by nebulization 2 (two) times daily., Disp: , Rfl:    cetirizine (ZYRTEC) 10 MG tablet, Chew 10 mg by mouth daily., Disp: , Rfl:    diclofenac Sodium (VOLTAREN) 1 % GEL, Apply 1 application topically 2 (two) times daily as needed (pain)., Disp: , Rfl:    fluticasone (FLONASE) 50 MCG/ACT nasal spray, Place 1 spray into both nostrils daily., Disp: , Rfl:    hydrochlorothiazide (HYDRODIURIL) 25 MG tablet, Take 25 mg by mouth daily., Disp: , Rfl:    lidocaine-prilocaine (EMLA) cream, Apply to affected area once, Disp: 30 g, Rfl: 3   Mepolizumab (NUCALA) 100 MG SOLR, Inject 100 mg into the skin every 30 (thirty) days., Disp: , Rfl:    montelukast (SINGULAIR) 10 MG tablet, Take 10 mg  by mouth at bedtime., Disp: , Rfl:    pantoprazole (PROTONIX) 40 MG tablet, Take 40 mg by mouth daily., Disp: , Rfl:    roflumilast (DALIRESP) 500 MCG TABS tablet, Take 500 mcg by mouth daily., Disp: , Rfl:    simvastatin (ZOCOR) 20 MG tablet, Take 20 mg by mouth daily., Disp: , Rfl:    tamsulosin (FLOMAX) 0.4 MG CAPS capsule, TAKE 1 CAPSULE BY MOUTH EVERY DAY, Disp: 30 capsule, Rfl: 2   tiotropium (SPIRIVA) 18 MCG inhalation capsule, Place 18 mcg into inhaler and inhale daily., Disp: , Rfl:    traMADol (ULTRAM) 50 MG tablet, Take 50 mg by mouth 2 (two) times daily., Disp: , Rfl:   Current Facility-Administered Medications:    gemcitabine (GEMZAR) chemo syringe for bladder instillation 2,000 mg, 2,000 mg, Bladder Instillation, Once, Hollice Espy, MD  Facility-Administered Medications Ordered in Other Visits:    heparin lock flush 100 unit/mL, 500 Units, Intravenous, Once, Jacquelin Hawking, NP  Physical exam:  Vitals:   01/23/21 1001  BP: (!) 145/89  Pulse: 91  Resp: 18  Temp: 98.4 F (36.9 C)  TempSrc: Oral  SpO2: 96%  Weight: 150 lb 5 oz (68.2 kg)   Physical Exam Constitutional:       Appearance: Normal appearance.  HENT:     Head: Normocephalic and atraumatic.  Eyes:     Pupils: Pupils are equal, round, and reactive to light.  Cardiovascular:     Rate and Rhythm: Normal rate and regular rhythm.     Heart sounds: Normal heart sounds. No murmur heard. Pulmonary:     Effort: Pulmonary effort is normal.     Breath sounds: Decreased breath sounds present. No wheezing.     Comments: On oxygen Abdominal:     General: Bowel sounds are normal. There is no distension.     Palpations: Abdomen is soft.     Tenderness: There is no abdominal tenderness.  Musculoskeletal:        General: Normal range of motion.     Cervical back: Normal range of motion.  Skin:    General: Skin is warm and dry.     Findings: No rash.  Neurological:     Mental Status: He is alert and oriented to person, place, and time.  Psychiatric:        Judgment: Judgment normal.     CMP Latest Ref Rng & Units 01/23/2021  Glucose 70 - 99 mg/dL 113(H)  BUN 8 - 23 mg/dL 18  Creatinine 0.61 - 1.24 mg/dL 0.89  Sodium 135 - 145 mmol/L 137  Potassium 3.5 - 5.1 mmol/L 3.2(L)  Chloride 98 - 111 mmol/L 98  CO2 22 - 32 mmol/L 30  Calcium 8.9 - 10.3 mg/dL 9.0  Total Protein 6.5 - 8.1 g/dL 7.3  Total Bilirubin 0.3 - 1.2 mg/dL 0.9  Alkaline Phos 38 - 126 U/L 67  AST 15 - 41 U/L 22  ALT 0 - 44 U/L 21   CBC Latest Ref Rng & Units 01/23/2021  WBC 4.0 - 10.5 K/uL 8.4  Hemoglobin 13.0 - 17.0 g/dL 14.1  Hematocrit 39.0 - 52.0 % 42.0  Platelets 150 - 400 K/uL 235    No images are attached to the encounter.  DG Chest 2 View  Result Date: 01/22/2021 CLINICAL DATA:  75 year old male with history of prior malignancy, COPD, and CT demonstrating changes at the lung base EXAM: CHEST - 2 VIEW COMPARISON:  CT 10/13/2020, plain film 01/08/2008 FINDINGS: Cardiomediastinal silhouette unchanged  in size and contour. No evidence of central vascular congestion. No interlobular septal thickening. Stigmata of emphysema, with  increased retrosternal airspace, flattened hemidiaphragms, increased AP diameter, and hyperinflation on the AP view. Architectural distortion in the hilar regions and the lung bases. No pneumothorax or pleural effusion. No confluent airspace disease. Reticular opacities throughout the lungs. Right IJ port catheter. No acute displaced fracture. Degenerative changes of the spine. IMPRESSION: Emphysema and chronic lung changes with no evidence of acute cardiopulmonary disease. Right IJ port catheter Electronically Signed   By: Corrie Mckusick D.O.   On: 01/22/2021 12:36     Assessment and plan- Patient is a 75 y.o. male  with high-grade urothelial carcinoma.  He is status post concurrent chemoradiation.  Urothelial carcinoma- Status post chemoradiation which she completed on 08/10/2020.  He had a CT scan of his abdomen/pelvis on 10/13/2020 which showed no evidence of recurrent or progressive disease.  His bladder was underdistended and definite bladder mass could not be identified.  He had a cystoscopy with Dr. Erlene Quan on 11/29/2020 which was unremarkable.  He is scheduled for routine cystoscopies every 4 months..  There were incidental findings of a right lung nodule measuring 10 x 8 mm in size.  He returns today after chest x-ray for a 106-monthfollow-up.  Chest x-ray from yesterday revealed emphysema and chronic lung changes with no evidence of acute cardiopulmonary disease.  RTC as scheduled to see Dr. RJanese Banksin December after CT abdomen/pelvis/chest.  Colon cancer surveillance- Had a colonoscopy on 01/16/2021 where 2 polyps were removed and were negative for dysplasia or malignancy.  Hypokalemia- Likely secondary to hydrochlorothiazide.  We will send prescription for 20 mEq potassium daily x10 days.  Encouraged him to eat potassium rich foods.   I spent 15 minutes dedicated to the care of this patient (face-to-face and non-face-to-face) on the date of the encounter to include what is described in the assessment  and plan.    Visit Diagnosis 1. Malignant neoplasm of overlapping sites of bladder ([place]   2. Hypokalemia    JFaythe Casa NP 01/23/2021 10:35 AM

## 2021-02-02 ENCOUNTER — Ambulatory Visit
Admission: RE | Admit: 2021-02-02 | Discharge: 2021-02-02 | Disposition: A | Discharge status: Home / Self Care | Payer: Medicare HMO | Source: Ambulatory Visit | Attending: Radiation Oncology | Admitting: Radiation Oncology

## 2021-02-02 ENCOUNTER — Other Ambulatory Visit: Payer: Self-pay

## 2021-02-02 ENCOUNTER — Encounter: Payer: Self-pay | Admitting: Radiation Oncology

## 2021-02-02 VITALS — BP 145/72 | Temp 96.3°F | Wt 153.0 lb

## 2021-02-02 DIAGNOSIS — C679 Malignant neoplasm of bladder, unspecified: Secondary | ICD-10-CM

## 2021-02-02 NOTE — Progress Notes (Signed)
Radiation Oncology Follow up Note  Name: Joe Torres   Date:   02/02/2021 MRN:  [id number] DOB: 03-15-1946    This 75 y.o. 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  REFERRING PROVIDER: [facility] [employer]  HPI: Patient is a 75 year old male seen today 5 months out having completed concurrent chemoradiation for muscle i invasive transitional cell carcinoma the bladder.  Seen today in routine follow-up he is doing well.  He specifically denies any significant increase in lower urinary tract symptoms diarrhea or fatigue.  He had a cystoscopy by Dr. Erlene Quan back I believe in June showing no evidence of disease.  He had CT scan of chest abdomen pelvis also in June showing no visible bladder lesion or upper tract lesion.  Also no evidence of metastatic disease in chest abdomen or pelvis.  There was a 1 cm nodular density in the posterior right lung base which is good to be followed.  COMPLICATIONS OF TREATMENT: none  FOLLOW UP COMPLIANCE: keeps appointments   PHYSICAL EXAM:  BP (!) 145/72   Temp (!) 96.3 F (35.7 C) (Tympanic)   Wt 153 lb (69.4 kg)   SpO2 98% Comment: 2.5 units o2  BMI 23.96 kg/m patient is on nasal oxygen Well-developed well-nourished patient in NAD. HEENT reveals PERLA, EOMI, discs not visualized.  Oral cavity is clear. No oral mucosal lesions are identified. Neck is clear without evidence of cervical or supraclavicular adenopathy. Lungs are clear to A&P. Cardiac examination is essentially unremarkable with regular rate and rhythm without murmur rub or thrill. Abdomen is benign with no organomegaly or masses noted. Motor sensory and DTR levels are equal and symmetric in the upper and lower extremities. Cranial nerves II through XII are grossly intact. Proprioception is intact. No peripheral adenopathy or edema is identified. No motor or sensory levels are  noted. Crude visual fields are within normal range.  RADIOLOGY RESULTS: CT scan chest abdomen pelvis all reviewed compatible with above-stated findings  PLAN: Present time patient is doing well with no evidence of disease.  He continues close surveillance by Dr. Erlene Quan with cystoscopy as well as a repeat CT scan in December.  We will continue to track the lesion in his lung.  I have asked to see him back in January.  Should the lung lesion proved to be progressing with indication for possible malignancy would discuss treatment at that time.  Patient and daughter both comprehend my recommendations well.  I would like to take this opportunity to thank you for allowing me to participate in the care of your patient.Noreene Filbert, MD

## 2021-02-13 ENCOUNTER — Other Ambulatory Visit: Payer: Self-pay | Admitting: Nurse Practitioner

## 2021-02-13 ENCOUNTER — Other Ambulatory Visit: Payer: Self-pay | Admitting: Urology

## 2021-02-13 DIAGNOSIS — C678 Malignant neoplasm of overlapping sites of bladder: Secondary | ICD-10-CM

## 2021-02-15 ENCOUNTER — Encounter: Payer: Self-pay | Admitting: Oncology

## 2021-04-02 NOTE — Progress Notes (Signed)
   04/03/2021 CC: cysto   HPI: Joe Torres is a 75 y.o. male with a personal history of muscle invasive bladder cancer , who returns today for surveillance cystoscopy.   He was take to the operating room for TURBT on 05/22/2020 at which time several overlapping posterior bladder wall tumors were identified, at least 2.5 cm as well as a more worrisome irregular nodular tumor in the left lateral bladder wall which was highly suspicious for muscle invasive cancer.   He is now status post chemo 5-FU / radiation.  Most recent CT scan 10/13/20 NED in the abdomen pelvis and a 10 mm area of nodularity in the right lung base felt to be possibly infectious.   He is accompanied by a spanish interpreter today. He has no new urinary symptoms.   Vitals:   04/03/21 1046  BP: 119/80  Pulse: (!) 147   NED. A&Ox3.   No respiratory distress   Abd soft, NT, ND Normal phallus with bilateral descended testicles  Cystoscopy Procedure Note  Patient identification was confirmed, informed consent was obtained, and patient was prepped using Betadine solution.  Lidocaine jelly was administered per urethral meatus.     Pre-Procedure: - Inspection reveals a normal caliber ureteral meatus.  Procedure: The flexible cystoscope was introduced without difficulty - No urethral strictures/lesions are present. - Enlarged prostate  - Normal bladder neck - Bilateral ureteral orifices identified - Bladder mucosa  reveals no ulcers, tumors, or lesions. Stellate scar posterior bladder wall appreciated.  Retroflexion shows unremarkable    Post-Procedure: - Patient tolerated the procedure well   Assessment/ Plan:  Malignant neoplasm of posterior wall of urinary bladder ([place]) -S/p modal therapy with TURBT, chemo, and rads   -continues to be NED  - Continue fairly close surveillance cystoscopies, generally would recommend every 4 months; but will decrease in frequency over time  - imaging deferred to Dr.  Janese Banks- next study scheduled 12/22  - Urinalysis complete   Return in 4 months for surveillance cystoscopy  Conley Rolls as a scribe for Hollice Espy, MD.,have documented all relevant documentation on the behalf of Hollice Espy, MD,as directed by  Hollice Espy, MD while in the presence of Hollice Espy, MD.  I have reviewed the above documentation for accuracy and completeness, and I agree with the above.   Hollice Espy, MD

## 2021-04-03 ENCOUNTER — Ambulatory Visit (INDEPENDENT_AMBULATORY_CARE_PROVIDER_SITE_OTHER): Payer: Medicare HMO | Admitting: Urology

## 2021-04-03 ENCOUNTER — Other Ambulatory Visit: Payer: Self-pay

## 2021-04-03 ENCOUNTER — Encounter: Payer: Self-pay | Admitting: Urology

## 2021-04-03 VITALS — BP 119/80 | HR 147 | Ht 67.0 in | Wt 153.0 lb

## 2021-04-03 DIAGNOSIS — C674 Malignant neoplasm of posterior wall of bladder: Secondary | ICD-10-CM | POA: Diagnosis not present

## 2021-04-03 LAB — URINALYSIS, COMPLETE
Bilirubin, UA: NEGATIVE
Glucose, UA: NEGATIVE
Ketones, UA: NEGATIVE
Nitrite, UA: NEGATIVE
Protein,UA: NEGATIVE
RBC, UA: NEGATIVE
Specific Gravity, UA: 1.015 (ref 1.005–1.030)
Urobilinogen, Ur: 0.2 mg/dL (ref 0.2–1.0)
pH, UA: 6 (ref 5.0–7.5)

## 2021-04-03 LAB — MICROSCOPIC EXAMINATION
Bacteria, UA: NONE SEEN
Epithelial Cells (non renal): NONE SEEN /hpf (ref 0–10)
RBC, Urine: NONE SEEN /hpf (ref 0–2)

## 2021-04-23 ENCOUNTER — Other Ambulatory Visit: Payer: Self-pay

## 2021-04-23 ENCOUNTER — Ambulatory Visit
Admission: RE | Admit: 2021-04-23 | Discharge: 2021-04-23 | Disposition: A | Discharge status: Home / Self Care | Payer: Medicare HMO | Source: Ambulatory Visit | Attending: Oncology | Admitting: Oncology

## 2021-04-23 DIAGNOSIS — C678 Malignant neoplasm of overlapping sites of bladder: Secondary | ICD-10-CM

## 2021-04-24 ENCOUNTER — Encounter: Payer: Self-pay | Admitting: Oncology

## 2021-04-24 ENCOUNTER — Inpatient Hospital Stay: Payer: Medicare HMO | Attending: Oncology

## 2021-04-24 ENCOUNTER — Inpatient Hospital Stay (HOSPITAL_BASED_OUTPATIENT_CLINIC_OR_DEPARTMENT_OTHER): Payer: Medicare HMO | Admitting: Oncology

## 2021-04-24 VITALS — BP 133/83 | HR 85 | Temp 98.4°F | Ht 67.0 in | Wt 151.4 lb

## 2021-04-24 DIAGNOSIS — C678 Malignant neoplasm of overlapping sites of bladder: Secondary | ICD-10-CM | POA: Insufficient documentation

## 2021-04-24 DIAGNOSIS — Z923 Personal history of irradiation: Secondary | ICD-10-CM | POA: Insufficient documentation

## 2021-04-24 DIAGNOSIS — Z87891 Personal history of nicotine dependence: Secondary | ICD-10-CM | POA: Insufficient documentation

## 2021-04-24 DIAGNOSIS — E876 Hypokalemia: Secondary | ICD-10-CM | POA: Insufficient documentation

## 2021-04-24 DIAGNOSIS — R062 Wheezing: Secondary | ICD-10-CM | POA: Diagnosis not present

## 2021-04-24 DIAGNOSIS — R9389 Abnormal findings on diagnostic imaging of other specified body structures: Secondary | ICD-10-CM

## 2021-04-24 DIAGNOSIS — Z9221 Personal history of antineoplastic chemotherapy: Secondary | ICD-10-CM | POA: Insufficient documentation

## 2021-04-24 DIAGNOSIS — I10 Essential (primary) hypertension: Secondary | ICD-10-CM | POA: Insufficient documentation

## 2021-04-24 LAB — CBC WITH DIFFERENTIAL/PLATELET
Abs Immature Granulocytes: 0.03 10*3/uL (ref 0.00–0.07)
Basophils Absolute: 0 10*3/uL (ref 0.0–0.1)
Basophils Relative: 0 %
Eosinophils Absolute: 0 10*3/uL (ref 0.0–0.5)
Eosinophils Relative: 0 %
HCT: 40.8 % (ref 39.0–52.0)
Hemoglobin: 13.7 g/dL (ref 13.0–17.0)
Immature Granulocytes: 1 %
Lymphocytes Relative: 19 %
Lymphs Abs: 1.1 10*3/uL (ref 0.7–4.0)
MCH: 29.3 pg (ref 26.0–34.0)
MCHC: 33.6 g/dL (ref 30.0–36.0)
MCV: 87.2 fL (ref 80.0–100.0)
Monocytes Absolute: 0.7 10*3/uL (ref 0.1–1.0)
Monocytes Relative: 11 %
Neutro Abs: 4.1 10*3/uL (ref 1.7–7.7)
Neutrophils Relative %: 69 %
Platelets: 228 10*3/uL (ref 150–400)
RBC: 4.68 MIL/uL (ref 4.22–5.81)
RDW: 14 % (ref 11.5–15.5)
WBC: 5.9 10*3/uL (ref 4.0–10.5)
nRBC: 0 % (ref 0.0–0.2)

## 2021-04-24 LAB — COMPREHENSIVE METABOLIC PANEL
ALT: 33 U/L (ref 0–44)
AST: 36 U/L (ref 15–41)
Albumin: 3.9 g/dL (ref 3.5–5.0)
Alkaline Phosphatase: 63 U/L (ref 38–126)
Anion gap: 10 (ref 5–15)
BUN: 11 mg/dL (ref 8–23)
CO2: 30 mmol/L (ref 22–32)
Calcium: 8.7 mg/dL — ABNORMAL LOW (ref 8.9–10.3)
Chloride: 98 mmol/L (ref 98–111)
Creatinine, Ser: 0.89 mg/dL (ref 0.61–1.24)
GFR, Estimated: >60 mL/min (ref >60)
Glucose, Bld: 143 mg/dL — ABNORMAL HIGH (ref 70–99)
Potassium: 3 mmol/L — ABNORMAL LOW (ref 3.5–5.1)
Sodium: 138 mmol/L (ref 135–145)
Total Bilirubin: 0.7 mg/dL (ref 0.3–1.2)
Total Protein: 7 g/dL (ref 6.5–8.1)

## 2021-04-24 MED ORDER — PREDNISONE 10 MG (21) PO TBPK: ORAL_TABLET | ORAL | 0 refills | Status: DC

## 2021-04-24 MED ORDER — HEPARIN SOD (PORK) LOCK FLUSH 100 UNIT/ML IV SOLN
500.0000 [IU] | Freq: Once | INTRAVENOUS | Status: AC
  Administered 2021-04-24: 500 [IU] via INTRAVENOUS
  Filled 2021-04-24: qty 5

## 2021-04-24 MED ORDER — POTASSIUM CHLORIDE CRYS ER 20 MEQ PO TBCR: EXTENDED_RELEASE_TABLET | ORAL | 2 refills | Status: DC

## 2021-04-24 MED ORDER — SODIUM CHLORIDE 0.9% FLUSH
10.0000 mL | Freq: Once | INTRAVENOUS | Status: AC
  Administered 2021-04-24: 10 mL via INTRAVENOUS
  Filled 2021-04-24: qty 10

## 2021-04-24 NOTE — Progress Notes (Signed)
Pt needs refill of potassium.

## 2021-04-24 NOTE — Progress Notes (Signed)
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  He presented with symptoms of gross hematuria and underwent CT urogram which showed 2 lesions in the bladder 1 measuring 2.1 x 2.1 cm in the posterior bladder wall as well as an eccentric mass just left of the midline measuring 1.6 x 1.1 cm.  Patient was seen by urology and underwent cystoscopy.  Visualization was limited due to gross hematuria.  Large at least 2 cm spherical high-grade appearing tumor in the midline posterior bladder wall.  This was followed by TURBT which showed a 2.5 cm posterior bladder tumor appearing necrotic/hemorrhagic another 2 cm nodular area which had a submucosal solid infiltrative appearance.  Significant bladder varices and hypervascularity surrounding tumors.  Pathology showed invasive urothelial carcinoma high-grade with high suspicion for muscularis propria involvement.  Scattered small fragments of muscularis propria identified an invasive tumor was seen adjacent to these muscle bundles.  However unequivocal muscle invasion was not readily apparent.  Patient could not urinate following his TURBT and required temporary placement of Foley catheter but was able to pass his trial of void later.  Risk of repeat TURBT was discussed and mutual consensus was to avoid another invasive procedure given his oxygen dependent COPD.  Case discussed at tumor board.  He was not deemed to be a  good candidate for cystoprostatectomy.  Based on the appearance of the tumor at the time of TURBT as well as pathology, it was deemed that there was a high risk of muscle invasion.   Patient completed concurrent chemoradiation to his bladder with 5-FU mitomycin regimen in March 2022.  Interval history-patient presents today with his daughter.  Overall he is doing well.  Has baseline exertional shortness of breath and is on 24/7 oxygen.  Over the past 2 days has noticed some wheezing and increased coughing.  Denies fevers.  He is currently not taking potassium supplements.  Denies any blood in his urine.  He was evaluated by Dr. Erlene Quan last month. Everything was normal per patient report.  ECOG PS- 1 Pain scale- 0  Review of systems- Review of Systems  Constitutional:  Positive for malaise/fatigue. Negative for chills, fever and weight loss.  HENT:  Negative for congestion, ear pain and tinnitus.   Eyes: Negative.  Negative for blurred vision and double vision.  Respiratory:  Positive for cough, sputum production, shortness of breath and wheezing.   Cardiovascular: Negative.  Negative for chest pain, palpitations and leg swelling.  Gastrointestinal: Negative.  Negative for abdominal pain, constipation, diarrhea, nausea and vomiting.  Genitourinary:  Negative for dysuria, frequency and urgency.  Musculoskeletal:  Negative for back pain and falls.  Skin: Negative.  Negative for rash.  Neurological: Negative.  Negative for weakness and headaches.  Endo/Heme/Allergies: Negative.  Does not bruise/bleed easily.  Psychiatric/Behavioral: Negative.  Negative for depression. The patient is not nervous/anxious and does not have insomnia.      Allergies  Allergen Reactions   Shrimp [Shellfish Allergy] Other (See Comments)  Per MD comments critical     Past Medical History:  Diagnosis Date   Arthritis    Asthma    Bladder cancer ([place])    Cancer ([place])    COPD (chronic obstructive pulmonary  disease) (HCC)    Dyspnea    GERD (gastroesophageal reflux disease)    Hypertension      Past Surgical History:  Procedure Laterality Date   COLONOSCOPY     COLONOSCOPY WITH PROPOFOL N/A 01/16/2021   Procedure: COLONOSCOPY WITH PROPOFOL;  Surgeon: [name], MD;  Location: [facility] ENDOSCOPY;  Service: Gastroenterology;  Laterality: N/A;  SPANISH INTERPRETER 9 AM ARRIVAL REQUEST   HERNIA REPAIR  2017   PORTA CATH INSERTION N/A 06/19/2020   Procedure: PORTA CATH INSERTION;  Surgeon: Algernon Huxley, MD;  Location: [place] CV LAB;  Service: Cardiovascular;  Laterality: N/A;   TRANSURETHRAL RESECTION OF BLADDER TUMOR WITH GYRUS (TURBT-GYRUS)  07/2020   TRANSURETHRAL RESECTION OF BLADDER TUMOR WITH MITOMYCIN-C N/A 05/22/2020   Procedure: TRANSURETHRAL RESECTION OF BLADDER TUMOR WITH Gemcitabine;  Surgeon: Hollice Espy, MD;  Location: [facility] ORS;  Service: Urology;  Laterality: N/A;    Social History   Socioeconomic History   Marital status: Married    Spouse name: Not on file   Number of children: Not on file   Years of education: Not on file   Highest education level: Not on file  Occupational History   Not on file  Tobacco Use   Smoking status: Former    Packs/day: 1.50    Years: 40.00    Pack years: 60.00    Types: Cigarettes    Quit date: 05/21/2008    Years since quitting: 12.9   Smokeless tobacco: Never  Vaping Use   Vaping Use: Never used  Substance and Sexual Activity   Alcohol use: Not Currently   Drug use: Never   Sexual activity: Not Currently  Other Topics Concern   Not on file  Social History Narrative   Not on file   Social Determinants of Health   Financial Resource Strain: Not on file  Food Insecurity: Not on file  Transportation Needs: Not on file  Physical Activity: Not on file  Stress: Not on file  Social Connections: Not on file  Intimate Partner Violence: Not on file    History reviewed. No pertinent family history.   Current  Outpatient Medications:    acetaminophen (TYLENOL) 500 MG tablet, Take 500 mg by mouth every 8 (eight) hours as needed for moderate pain., Disp: , Rfl:    albuterol (PROVENTIL HFA;VENTOLIN HFA) 108 (90 Base) MCG/ACT inhaler, Inhale 2 puffs into the lungs every 4 (four) hours as needed for wheezing or shortness of breath., Disp: , Rfl:    albuterol (PROVENTIL) (2.5 MG/3ML) 0.083% nebulizer solution, Take 2.5 mg by nebulization every 4 (four) hours as needed for wheezing or shortness of breath., Disp: , Rfl:    amLODipine (NORVASC) 10 MG tablet, Take 10 mg by mouth daily., Disp: , Rfl:    arformoterol (BROVANA) 15 MCG/2ML NEBU, Take 15 mcg by nebulization 2 (two) times daily., Disp: , Rfl:    budesonide (PULMICORT) 0.5 MG/2ML nebulizer solution, Take 0.5 mg by nebulization 2 (two) times daily., Disp: , Rfl:    cetirizine (ZYRTEC) 10 MG tablet, Chew 10 mg by mouth daily., Disp: , Rfl:    diclofenac Sodium (VOLTAREN) 1 % GEL, Apply 1 application topically 2 (two) times daily as needed (pain)., Disp: , Rfl:    fluticasone (FLONASE)  50 MCG/ACT nasal spray, Place 1 spray into both nostrils daily., Disp: , Rfl:    hydrochlorothiazide (HYDRODIURIL) 25 MG tablet, Take 25 mg by mouth daily., Disp: , Rfl:    lidocaine-prilocaine (EMLA) cream, Apply to affected area once, Disp: 30 g, Rfl: 3   Mepolizumab (NUCALA) 100 MG SOLR, Inject 100 mg into the skin every 30 (thirty) days., Disp: , Rfl:    montelukast (SINGULAIR) 10 MG tablet, Take 10 mg by mouth at bedtime., Disp: , Rfl:    pantoprazole (PROTONIX) 40 MG tablet, Take 40 mg by mouth daily., Disp: , Rfl:    potassium chloride SA (KLOR-CON M) 20 MEQ tablet, Take 20 meq twice per day X 14 days followed by 20 meq daily., Disp: 60 tablet, Rfl: 2   potassium chloride SA (KLOR-CON) 20 MEQ tablet, Take 1 tablet (20 mEq total) by mouth daily., Disp: 14 tablet, Rfl: 0   predniSONE (STERAPRED UNI-PAK 21 TAB) 10 MG (21) TBPK tablet, Take as directed., Disp: 21 tablet,  Rfl: 0   roflumilast (DALIRESP) 500 MCG TABS tablet, Take 500 mcg by mouth daily., Disp: , Rfl:    simvastatin (ZOCOR) 20 MG tablet, Take 20 mg by mouth daily., Disp: , Rfl:    tamsulosin (FLOMAX) 0.4 MG CAPS capsule, TAKE 1 CAPSULE BY MOUTH EVERY DAY, Disp: 90 capsule, Rfl: 0   tiotropium (SPIRIVA) 18 MCG inhalation capsule, Place 18 mcg into inhaler and inhale daily., Disp: , Rfl:    traMADol (ULTRAM) 50 MG tablet, Take 50 mg by mouth 2 (two) times daily., Disp: , Rfl:   Current Facility-Administered Medications:    gemcitabine (GEMZAR) chemo syringe for bladder instillation 2,000 mg, 2,000 mg, Bladder Instillation, Once, Hollice Espy, MD  Physical exam:  Vitals:   04/24/21 1040  BP: 133/83  Pulse: 85  Temp: 98.4 F (36.9 C)  TempSrc: Tympanic  SpO2: 100%  Weight: 151 lb 6.4 oz (68.7 kg)  Height: 5\' 7"  (1.702 m)   Physical Exam Constitutional:      Appearance: Normal appearance.  HENT:     Head: Normocephalic and atraumatic.  Eyes:     Pupils: Pupils are equal, round, and reactive to light.  Cardiovascular:     Rate and Rhythm: Normal rate and regular rhythm.     Heart sounds: Normal heart sounds. No murmur heard. Pulmonary:     Effort: Pulmonary effort is normal.     Breath sounds: Wheezing and rhonchi present.     Comments: ON oxygen  Abdominal:     General: Bowel sounds are normal. There is no distension.     Palpations: Abdomen is soft.     Tenderness: There is no abdominal tenderness.  Musculoskeletal:        General: Normal range of motion.     Cervical back: Normal range of motion.  Skin:    General: Skin is warm and dry.     Findings: No rash.  Neurological:     Mental Status: He is alert and oriented to person, place, and time.  Psychiatric:        Judgment: Judgment normal.     CMP Latest Ref Rng & Units 04/24/2021  Glucose 70 - 99 mg/dL 143(H)  BUN 8 - 23 mg/dL 11  Creatinine 0.61 - 1.24 mg/dL 0.89  Sodium 135 - 145 mmol/L 138  Potassium 3.5 -  5.1 mmol/L 3.0(L)  Chloride 98 - 111 mmol/L 98  CO2 22 - 32 mmol/L 30  Calcium 8.9 - 10.3 mg/dL 8.7(L)  Total Protein 6.5 - 8.1 g/dL 7.0  Total Bilirubin 0.3 - 1.2 mg/dL 0.7  Alkaline Phos 38 - 126 U/L 63  AST 15 - 41 U/L 36  ALT 0 - 44 U/L 33   CBC Latest Ref Rng & Units 04/24/2021  WBC 4.0 - 10.5 K/uL 5.9  Hemoglobin 13.0 - 17.0 g/dL 13.7  Hematocrit 39.0 - 52.0 % 40.8  Platelets 150 - 400 K/uL 228    No images are attached to the encounter.  CT CHEST ABDOMEN PELVIS WO CONTRAST  Result Date: 04/23/2021 CLINICAL DATA:  75 year old male with history of urothelial neoplasm, bladder cancer. EXAM: CT CHEST, ABDOMEN AND PELVIS WITHOUT CONTRAST TECHNIQUE: Multidetector CT imaging of the chest, abdomen and pelvis was performed following the standard protocol without IV contrast. COMPARISON:  Comparison is made with November 10, 2018 tube. FINDINGS: CT CHEST FINDINGS Cardiovascular: RIGHT-sided Port-A-Cath terminates at the caval to atrial junction. Heart size is normal with three-vessel coronary artery disease. No substantial pericardial effusion. Small pericardial effusion present today is of similar volume to what was present previously. Calcified atheromatous plaque in the thoracic aorta. Normal caliber of the central pulmonary vessels. Limited assessment of cardiovascular structures given lack of intravenous contrast. Mediastinum/Nodes: No thoracic inlet, axillary, mediastinal or hilar adenopathy. Esophagus grossly normal. Lungs/Pleura: Signs of pulmonary emphysema. Mild pleural and parenchymal scarring. Small RIGHT upper lobe nodular area measuring 5 mm with some calcification more likely related to scarring and unchanged. Similar areas along the pleural surface also in the LEFT upper lobe no effusion. No consolidative process. Musculoskeletal: See below for full musculoskeletal details. CT ABDOMEN PELVIS FINDINGS Hepatobiliary: Normal hepatic contour, no hepatic enlargement, gross biliary duct  distension or signs of pericholecystic fluid. No visible lesion on noncontrast imaging. Pancreas: Pancreas with normal contour without signs of inflammation. Spleen: Normal in size and contour. Adrenals/Urinary Tract: Adrenal glands are normal. Mild cortical scarring of the bilateral kidneys. No hydronephrosis. Stable RIGHT-sided renal cyst. Urinary bladder is collapsed. No gross perivesical stranding. Stomach/Bowel: Diverticular changes of the sigmoid colon. Midline and upper abdominal position of the cecum. Normal appendix. Cecal position is unchanged since June and not associated with obstruction or adjacent stranding. Vascular/Lymphatic: Aortic atherosclerosis. No sign of aneurysm. Smooth contour of the IVC. There is no gastrohepatic or hepatoduodenal ligament lymphadenopathy. No retroperitoneal or mesenteric lymphadenopathy. No pelvic sidewall lymphadenopathy. Atherosclerotic changes are moderate and assessment of vascular structures is limited by lack of intravenous contrast. Reproductive: Unremarkable. Other: No ascites. Musculoskeletal: No acute bone finding. No destructive bone process. Spinal degenerative changes. IMPRESSION: No evidence of metastatic disease in the chest, abdomen or pelvis. Resolution of nodularity seen at the LEFT lung base on the previous study. This was reported on the RIGHT but appears to been present on the LEFT and is no longer visible. Signs of pulmonary emphysema. Diverticular changes of the sigmoid colon without evidence of acute diverticulitis. Aortic atherosclerosis. Aortic Atherosclerosis (ICD10-I70.0) and Emphysema (ICD10-J43.9). Electronically Signed   By: Zetta Bills M.D.   On: 04/23/2021 14:38     Assessment and plan- Patient is a 75 y.o. male  with high-grade urothelial carcinoma.  He is status post concurrent chemoradiation. Here to discuss recent Ct scan results.   Urothelial carcinoma- He is status post chemoradiation which she completed on 08/10/2020.  Has  been following up with Dr. Janese Banks every 27-months with CT chest abdomen pelvis and cystoscopy every 4 months.  CT scan from 04/23/2021 shows no evidence of metastatic disease in chest abdomen or pelvis.  Resolution of nodularity seen at the left lung base on previous study.  This was reported on the right but appears to be present on the left and is no longer visible.  Signs of pulmonary emphysema.  Colon cancer surveillance- Most recent colonoscopy from 01/16/2021 show 2 polyps which were removed and were negative for dysplasia or malignancy.  Hypokalemia- This appears to be a chronic problem.  Increase potassium rich foods.  We will send in a prescription for potassium supplements 20 mEq twice daily x2 weeks followed by 20 mEq daily x1 month.  He is on hydrochlorothiazide which is likely contributing.  Wheezing/cough- Recommend chest x-ray.  We will send him in prednisone taper.  We will call him with results from chest x-ray.  Can try Mucinex over-the-counter.  Continue rescue inhaler as needed.  Disposition- Return to clinic in 6 weeks for repeat labs to check his potassium level. Continue potassium supplements as discussed in clinic. Chest x-ray. Steroid taper for wheezing. RTC in 6 months for follow-up with Dr. Janese Banks and CT scan.  I spent 15 minutes dedicated to the care of this patient (face-to-face and non-face-to-face) on the date of the encounter to include what is described in the assessment and plan.  Visit Diagnosis 1. Malignant neoplasm of overlapping sites of bladder ([place])   2. Wheezing   3. Hypokalemia    Faythe Casa, NP 04/24/2021 4:33 PM

## 2021-05-03 ENCOUNTER — Other Ambulatory Visit: Payer: Self-pay | Admitting: Urology

## 2021-05-16 ENCOUNTER — Other Ambulatory Visit: Payer: Self-pay | Admitting: *Deleted

## 2021-05-16 DIAGNOSIS — E876 Hypokalemia: Secondary | ICD-10-CM

## 2021-05-23 ENCOUNTER — Inpatient Hospital Stay: Payer: Medicare HMO | Attending: Oncology

## 2021-05-23 ENCOUNTER — Other Ambulatory Visit: Payer: Self-pay

## 2021-05-23 ENCOUNTER — Ambulatory Visit
Admission: RE | Admit: 2021-05-23 | Discharge: 2021-05-23 | Disposition: A | Discharge status: Home / Self Care | Payer: Medicare HMO | Source: Ambulatory Visit | Attending: Radiation Oncology | Admitting: Radiation Oncology

## 2021-05-23 ENCOUNTER — Encounter: Payer: Self-pay | Admitting: *Deleted

## 2021-05-23 DIAGNOSIS — R1013 Epigastric pain: Secondary | ICD-10-CM | POA: Insufficient documentation

## 2021-05-23 DIAGNOSIS — Z923 Personal history of irradiation: Secondary | ICD-10-CM | POA: Insufficient documentation

## 2021-05-23 DIAGNOSIS — C678 Malignant neoplasm of overlapping sites of bladder: Secondary | ICD-10-CM

## 2021-05-23 DIAGNOSIS — Z8551 Personal history of malignant neoplasm of bladder: Secondary | ICD-10-CM | POA: Diagnosis not present

## 2021-05-23 DIAGNOSIS — Z9221 Personal history of antineoplastic chemotherapy: Secondary | ICD-10-CM | POA: Insufficient documentation

## 2021-05-23 DIAGNOSIS — E876 Hypokalemia: Secondary | ICD-10-CM

## 2021-05-23 LAB — POTASSIUM: Potassium: 3.1 mmol/L — ABNORMAL LOW (ref 3.5–5.1)

## 2021-05-23 NOTE — Progress Notes (Signed)
Radiation Oncology Follow up Note  Name: Joe Torres   Date:   05/23/2021 MRN:  [id number] DOB: 1945-05-31    This 76 y.o. male presents to the clinic today for 46-month follow-up status post concurrent chemoradiation therapy for muscle invasive transitional cell carcinoma the latter.  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  He is seen today in routine follow-up is doing well.  Does have some occasional intermittent epigastric pain may be diet related or on recent CT scan he does have some diverticulum.  Specifically denies any increased lower urinary tract symptoms or diarrhea..  He recently had a CT scan showing no evidence status evidence of metastatic disease to chest abdomen or pelvis.  He had some nodularity left lung base which has resolved.  Bladder looks fine does have diverticular changes of the sigmoid colon without evidence of acute diverticulitis patient had cystoscopy back in November by Dr. Erlene Quan showing no evidence of disease.  COMPLICATIONS OF TREATMENT: none  FOLLOW UP COMPLIANCE: keeps appointments   PHYSICAL EXAM:  BP (!) (P) 157/85 (BP Location: Right Arm, Patient Position: Sitting)    Pulse (P) 98    Temp (!) (P) 95.8 F (35.4 C) (Tympanic)    Resp (P) 18    Wt (P) 152 lb (68.9 kg)    BMI (P) 23.81 kg/m  Well-developed well-nourished patient in NAD. HEENT reveals PERLA, EOMI, discs not visualized.  Oral cavity is clear. No oral mucosal lesions are identified. Neck is clear without evidence of cervical or supraclavicular adenopathy. Lungs are clear to A&P. Cardiac examination is essentially unremarkable with regular rate and rhythm without murmur rub or thrill. Abdomen is benign with no organomegaly or masses noted. Motor sensory and DTR levels are equal and symmetric in the upper and lower extremities.  Cranial nerves II through XII are grossly intact. Proprioception is intact. No peripheral adenopathy or edema is identified. No motor or sensory levels are noted. Crude visual fields are within normal range.  RADIOLOGY RESULTS: CT scans reviewed compatible with above-stated findings  PLAN: Present time patient is doing well no evidence of disease now 9 months out.  He had a cystoscopy back in November showing no evidence of disease scheduled for recystoscopy back in March.  I have asked to see him back in 1 year for follow-up.  Be happy to reevaluate the patient in time should further consultation be indicated.  I would like to take this opportunity to thank you for allowing me to participate in the care of your patient.Noreene Filbert, MD

## 2021-05-31 ENCOUNTER — Other Ambulatory Visit: Payer: Medicare HMO

## 2021-06-01 ENCOUNTER — Other Ambulatory Visit: Payer: Self-pay | Admitting: Oncology

## 2021-06-04 ENCOUNTER — Encounter: Payer: Self-pay | Admitting: Oncology

## 2021-06-04 NOTE — Telephone Encounter (Signed)
°  Component Ref Range & Units 12 d ago 1 mo ago 4 mo ago 7 mo ago 9 mo ago 10 mo ago 11 mo ago  Potassium 3.5 - 5.1 mmol/L 3.1 Low   3.0 Low   3.2 Low   3.1 Low   3.6  3.4 Low   3.8

## 2021-06-20 ENCOUNTER — Other Ambulatory Visit: Payer: Self-pay

## 2021-06-20 ENCOUNTER — Inpatient Hospital Stay: Payer: Medicare HMO | Attending: Oncology

## 2021-06-20 DIAGNOSIS — C678 Malignant neoplasm of overlapping sites of bladder: Secondary | ICD-10-CM | POA: Insufficient documentation

## 2021-06-20 DIAGNOSIS — Z95828 Presence of other vascular implants and grafts: Secondary | ICD-10-CM

## 2021-06-20 MED ORDER — SODIUM CHLORIDE 0.9% FLUSH
10.0000 mL | Freq: Once | INTRAVENOUS | Status: AC
  Administered 2021-06-20: 10 mL via INTRAVENOUS
  Filled 2021-06-20: qty 10

## 2021-06-20 MED ORDER — HEPARIN SOD (PORK) LOCK FLUSH 100 UNIT/ML IV SOLN
500.0000 [IU] | Freq: Once | INTRAVENOUS | Status: AC
  Administered 2021-06-20: 500 [IU] via INTRAVENOUS
  Filled 2021-06-20: qty 5

## 2021-07-28 ENCOUNTER — Other Ambulatory Visit: Payer: Self-pay | Admitting: Urology

## 2021-07-31 ENCOUNTER — Other Ambulatory Visit: Payer: Self-pay | Admitting: Nurse Practitioner

## 2021-07-31 NOTE — Progress Notes (Signed)
? ?  08/01/21 ?CC:  ?Chief Complaint  ?Patient presents with  ? Cysto  ? ? ? ? ?HPI: ?Joe Torres is a 76 y.o. male with a personal history of muscle invasive bladder cancer , who returns today for surveillance cystoscopy. ? ?He was take to the operating room for TURBT on 05/22/2020 at which time several overlapping posterior bladder wall tumors were identified, at least 2.5 cm as well as a more worrisome irregular nodular tumor in the left lateral bladder wall which was highly suspicious for muscle invasive cancer. ?  ?He is now status post chemo 5-FU / radiation.  ? ?Most recent imaging in the form of CT chest, abdomen, and pelvis on 04/23/2021 visualized Mild cortical scarring of the bilateral kidneys. No hydronephrosis. Stable RIGHT-sided renal cyst. Urinary bladder is collapsed. No gross perivesical stranding. No evidence of metastatic disease in the chest, abdomen or pelvis. ? ?Vitals:  ? 08/01/21 1135  ?BP: (!) 152/77  ?Pulse: (!) 102  ? ?NED. A&Ox3.   ?No respiratory distress   ?Abd soft, NT, ND ?Normal phallus with bilateral descended testicles ? ?Cystoscopy Procedure Note ? ?Patient identification was confirmed, informed consent was obtained, and patient was prepped using Betadine solution.  Lidocaine jelly was administered per urethral meatus.   ? ? ?Pre-Procedure: ?- Inspection reveals a normal caliber ureteral meatus. ? ?Procedure: ?The flexible cystoscope was introduced without difficulty ?- No urethral strictures/lesions are present. ?- Enlarged prostate  ?- Normal bladder neck ?- Bilateral ureteral orifices identified ?- No bladder stones ?- Moderate  trabeculation ?- left lateral bladder wall 2 cm area of erythema slight bullous consistent with radiation changes ?- TURBT resection scar on base of bladder moderately trabeculated ? ?Retroflexion shows left lateral bladder wall 2 cm area of erythema slight bullous  ? ? ?Post-Procedure: ?- Patient tolerated the procedure well ? ? ?Assessment/  Plan: ? ?Malignant neoplasm of posterior wall of urinary bladder (HCC) ?-S/p modal therapy with TURBT, chemo, and rads  ?-continues to be NED other than what appears to be consistent with radiation cystitis on the left lateral bladder wall no concern at this point in time for recurrence at this location ? - Continue fairly close surveillance cystoscopies ? ?2. Radiation cystitis  ?-Pathognomonic visual changes associated with radiation, do not suspect malignancy ?-We will continue to follow this area ?-Poor candidate to return to the OR for pathologic analysis as such, will defer this ? ?Cysto q4 months ? ?Conley Rolls as a scribe for Hollice Espy, MD.,have documented all relevant documentation on the behalf of Hollice Espy, MD,as directed by  Hollice Espy, MD while in the presence of Hollice Espy, MD. ? ?I have reviewed the above documentation for accuracy and completeness, and I agree with the above.  ? ?Hollice Espy, MD ? ?

## 2021-08-01 ENCOUNTER — Other Ambulatory Visit: Payer: Self-pay

## 2021-08-01 ENCOUNTER — Ambulatory Visit (INDEPENDENT_AMBULATORY_CARE_PROVIDER_SITE_OTHER): Payer: Medicare HMO | Admitting: Urology

## 2021-08-01 VITALS — BP 152/77 | HR 102 | Ht 67.0 in | Wt 152.0 lb

## 2021-08-01 DIAGNOSIS — C674 Malignant neoplasm of posterior wall of bladder: Secondary | ICD-10-CM

## 2021-08-02 LAB — URINALYSIS, COMPLETE
Bilirubin, UA: NEGATIVE
Glucose, UA: NEGATIVE
Ketones, UA: NEGATIVE
Leukocytes,UA: NEGATIVE
Nitrite, UA: NEGATIVE
Protein,UA: NEGATIVE
Specific Gravity, UA: 1.02 (ref 1.005–1.030)
Urobilinogen, Ur: 0.2 mg/dL (ref 0.2–1.0)
pH, UA: 6 (ref 5.0–7.5)

## 2021-08-02 LAB — MICROSCOPIC EXAMINATION: Bacteria, UA: NONE SEEN

## 2021-08-15 ENCOUNTER — Inpatient Hospital Stay: Payer: Medicare HMO | Attending: Oncology

## 2021-08-15 DIAGNOSIS — C678 Malignant neoplasm of overlapping sites of bladder: Secondary | ICD-10-CM | POA: Insufficient documentation

## 2021-08-15 DIAGNOSIS — Z95828 Presence of other vascular implants and grafts: Secondary | ICD-10-CM

## 2021-08-15 DIAGNOSIS — Z452 Encounter for adjustment and management of vascular access device: Secondary | ICD-10-CM | POA: Diagnosis not present

## 2021-08-15 MED ORDER — HEPARIN SOD (PORK) LOCK FLUSH 100 UNIT/ML IV SOLN
500.0000 [IU] | Freq: Once | INTRAVENOUS | Status: AC
  Administered 2021-08-15: 500 [IU] via INTRAVENOUS
  Filled 2021-08-15: qty 5

## 2021-08-15 MED ORDER — SODIUM CHLORIDE 0.9% FLUSH
10.0000 mL | INTRAVENOUS | Status: DC | PRN
  Administered 2021-08-15: 10 mL via INTRAVENOUS
  Filled 2021-08-15: qty 10

## 2021-10-10 ENCOUNTER — Inpatient Hospital Stay: Payer: Medicare HMO | Attending: Oncology

## 2021-10-10 DIAGNOSIS — C678 Malignant neoplasm of overlapping sites of bladder: Secondary | ICD-10-CM

## 2021-10-10 DIAGNOSIS — E876 Hypokalemia: Secondary | ICD-10-CM | POA: Diagnosis not present

## 2021-10-10 DIAGNOSIS — Z95828 Presence of other vascular implants and grafts: Secondary | ICD-10-CM

## 2021-10-10 MED ORDER — SODIUM CHLORIDE 0.9% FLUSH
10.0000 mL | Freq: Once | INTRAVENOUS | Status: AC
  Administered 2021-10-10: 10 mL via INTRAVENOUS
  Filled 2021-10-10: qty 10

## 2021-10-10 MED ORDER — HEPARIN SOD (PORK) LOCK FLUSH 100 UNIT/ML IV SOLN
500.0000 [IU] | Freq: Once | INTRAVENOUS | Status: AC
  Administered 2021-10-10: 500 [IU] via INTRAVENOUS
  Filled 2021-10-10: qty 5

## 2021-10-10 NOTE — Addendum Note (Signed)
Addended by: Magdalene Patricia B on: 10/10/2021 02:14 PM   Modules accepted: Orders

## 2021-10-10 NOTE — Progress Notes (Signed)
Survivorship Care Plan visit completed.  Treatment summary reviewed and given to patient.  ASCO answers booklet reviewed and given to patient.  CARE program and Cancer Transitions discussed with patient along with other resources cancer center offers to patients and caregivers.  Patient verbalized understanding.    

## 2021-10-23 ENCOUNTER — Other Ambulatory Visit: Payer: Self-pay | Admitting: Urology

## 2021-10-23 ENCOUNTER — Ambulatory Visit
Admission: RE | Admit: 2021-10-23 | Discharge: 2021-10-23 | Disposition: A | Discharge status: Home / Self Care | Payer: Medicare HMO | Source: Ambulatory Visit | Attending: Oncology | Admitting: Oncology

## 2021-10-23 DIAGNOSIS — C678 Malignant neoplasm of overlapping sites of bladder: Secondary | ICD-10-CM | POA: Insufficient documentation

## 2021-10-23 LAB — POCT I-STAT CREATININE: Creatinine, Ser: 0.9 mg/dL (ref 0.61–1.24)

## 2021-10-23 MED ORDER — IOHEXOL 300 MG/ML  SOLN
100.0000 mL | Freq: Once | INTRAMUSCULAR | Status: AC | PRN
  Administered 2021-10-23: 100 mL via INTRAVENOUS

## 2021-10-24 ENCOUNTER — Encounter: Payer: Self-pay | Admitting: Oncology

## 2021-10-24 ENCOUNTER — Inpatient Hospital Stay: Payer: Medicare HMO | Attending: Oncology | Admitting: Oncology

## 2021-10-24 VITALS — BP 130/85 | HR 85 | Temp 98.1°F | Resp 18 | Wt 154.7 lb

## 2021-10-24 DIAGNOSIS — I10 Essential (primary) hypertension: Secondary | ICD-10-CM | POA: Insufficient documentation

## 2021-10-24 DIAGNOSIS — J449 Chronic obstructive pulmonary disease, unspecified: Secondary | ICD-10-CM | POA: Insufficient documentation

## 2021-10-24 DIAGNOSIS — Z08 Encounter for follow-up examination after completed treatment for malignant neoplasm: Secondary | ICD-10-CM | POA: Diagnosis not present

## 2021-10-24 DIAGNOSIS — Z8551 Personal history of malignant neoplasm of bladder: Secondary | ICD-10-CM

## 2021-10-24 DIAGNOSIS — C679 Malignant neoplasm of bladder, unspecified: Secondary | ICD-10-CM | POA: Diagnosis present

## 2021-10-24 DIAGNOSIS — Z87891 Personal history of nicotine dependence: Secondary | ICD-10-CM | POA: Insufficient documentation

## 2021-10-24 NOTE — Progress Notes (Signed)
Hematology/Oncology Consult note [facility]  Telephone:([phone] [phone]  Patient Care Team: Center, [facility] as PCP - General (General Practice) Noreene Filbert, MD as Consulting Physician (Radiation Oncology) Sindy Guadeloupe, MD as Consulting Physician (Oncology) Hollice Espy, MD as Consulting Physician (Urology) Caren Hazy, MD as Consulting Physician (Cardiology) [name], MD as Consulting Physician (Gastroenterology) Venia Carbon, MD as Consulting Physician (Pulmonary Disease) Lucky Cowboy Erskine Squibb, MD as Consulting Physician (Vascular Surgery)   Name of the patient: Joe Torres  [id number]  1945/10/18   Date of visit: 10/24/21  Diagnosis- 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  He presented with symptoms of gross hematuria and underwent CT urogram which showed 2 lesions in the bladder 1 measuring 2.1 x 2.1 cm in the posterior bladder wall as well as an eccentric mass just left of the midline measuring 1.6 x 1.1 cm.  Patient was seen by urology and underwent cystoscopy.  Visualization was limited due to gross hematuria.  Large at least 2 cm spherical high-grade appearing tumor in the midline posterior bladder wall.  This was followed by TURBT which showed a 2.5 cm posterior bladder tumor appearing necrotic/hemorrhagic another 2 cm nodular area which had a submucosal solid infiltrative appearance.  Significant bladder varices and hypervascularity surrounding tumors.  Pathology showed invasive urothelial carcinoma high-grade with high suspicion for muscularis propria involvement.  Scattered small fragments of muscularis propria identified an invasive tumor was seen adjacent to these  muscle bundles.  However unequivocal muscle invasion was not readily apparent.  Patient could not urinate following his TURBT and required temporary placement of Foley catheter but was able to pass his trial of void later.  Risk of repeat TURBT was discussed and mutual consensus was to avoid another invasive procedure given his oxygen dependent COPD.  Case discussed at tumor board.  He was not deemed to be a good candidate for cystoprostatectomy.  Based on the appearance of the tumor at the time of TURBT as well as pathology, it was deemed that there was a high risk of muscle invasion.    Patient completed concurrent chemoradiation to his bladder with 5-FU mitomycin regimen in March 2022.  Interval history-patient has baseline exertional shortness of breath which waxes and wanes.  Denies any bleeding in his urine or burning urination.  ECOG PS- 2 Pain scale- 0   Review of systems- Review of Systems  Constitutional:  Positive for malaise/fatigue. Negative for chills, fever and weight loss.  HENT:  Negative for congestion, ear discharge and nosebleeds.   Eyes:  Negative for blurred vision.  Respiratory:  Positive for shortness of breath. Negative for cough, hemoptysis, sputum production and wheezing.   Cardiovascular:  Negative for chest pain, palpitations, orthopnea and claudication.  Gastrointestinal:  Negative for abdominal pain, blood in stool, constipation, diarrhea, heartburn, melena, nausea and vomiting.  Genitourinary:  Negative for dysuria, flank pain, frequency, hematuria and urgency.  Musculoskeletal:  Negative for back pain, joint pain and myalgias.  Skin:  Negative for rash.  Neurological:  Negative for dizziness, tingling, focal weakness, seizures, weakness and headaches.  Endo/Heme/Allergies:  Does not bruise/bleed easily.  Psychiatric/Behavioral:  Negative for depression and suicidal ideas. The patient does not have insomnia.  Allergies  Allergen Reactions   Shrimp  [Shellfish Allergy] Other (See Comments)    Per MD comments critical     Past Medical History:  Diagnosis Date   Arthritis    Asthma    Bladder cancer ([place])    Cancer ([place])    COPD (chronic obstructive pulmonary disease) (HCC)    Dyspnea    GERD (gastroesophageal reflux disease)    Hypertension      Past Surgical History:  Procedure Laterality Date   COLONOSCOPY     COLONOSCOPY WITH PROPOFOL N/A 01/16/2021   Procedure: COLONOSCOPY WITH PROPOFOL;  Surgeon: [name], MD;  Location: [facility] ENDOSCOPY;  Service: Gastroenterology;  Laterality: N/A;  SPANISH INTERPRETER 9 AM ARRIVAL REQUEST   HERNIA REPAIR  2017   PORTA CATH INSERTION N/A 06/19/2020   Procedure: PORTA CATH INSERTION;  Surgeon: Algernon Huxley, MD;  Location: [place] CV LAB;  Service: Cardiovascular;  Laterality: N/A;   TRANSURETHRAL RESECTION OF BLADDER TUMOR WITH GYRUS (TURBT-GYRUS)  07/2020   TRANSURETHRAL RESECTION OF BLADDER TUMOR WITH MITOMYCIN-C N/A 05/22/2020   Procedure: TRANSURETHRAL RESECTION OF BLADDER TUMOR WITH Gemcitabine;  Surgeon: Hollice Espy, MD;  Location: [facility] ORS;  Service: Urology;  Laterality: N/A;    Social History   Socioeconomic History   Marital status: Married    Spouse name: Not on file   Number of children: Not on file   Years of education: Not on file   Highest education level: Not on file  Occupational History   Not on file  Tobacco Use   Smoking status: Former    Packs/day: 1.50    Years: 40.00    Total pack years: 60.00    Types: Cigarettes    Quit date: 05/21/2008    Years since quitting: 13.4   Smokeless tobacco: Never  Vaping Use   Vaping Use: Never used  Substance and Sexual Activity   Alcohol use: Not Currently   Drug use: Never   Sexual activity: Not Currently  Other Topics Concern   Not on file  Social History Narrative   Not on file   Social Determinants of Health   Financial Resource Strain: Not on file  Food Insecurity: Not on file   Transportation Needs: Not on file  Physical Activity: Not on file  Stress: Not on file  Social Connections: Not on file  Intimate Partner Violence: Not on file    Family History  Problem Relation Age of Onset   Asthma Mother      Current Outpatient Medications:    acetaminophen (TYLENOL) 500 MG tablet, Take 500 mg by mouth every 8 (eight) hours as needed for moderate pain., Disp: , Rfl:    albuterol (PROVENTIL HFA;VENTOLIN HFA) 108 (90 Base) MCG/ACT inhaler, Inhale 2 puffs into the lungs every 4 (four) hours as needed for wheezing or shortness of breath., Disp: , Rfl:    albuterol (PROVENTIL) (2.5 MG/3ML) 0.083% nebulizer solution, Take 2.5 mg by nebulization every 4 (four) hours as needed for wheezing or shortness of breath., Disp: , Rfl:    amLODipine (NORVASC) 10 MG tablet, Take 10 mg by mouth daily., Disp: , Rfl:    arformoterol (BROVANA) 15 MCG/2ML NEBU, Take 15 mcg by nebulization 2 (two) times daily., Disp: , Rfl:    budesonide (PULMICORT) 0.5 MG/2ML nebulizer solution, Take 0.5 mg by nebulization 2 (two) times daily., Disp: , Rfl:    cetirizine (ZYRTEC) 10 MG tablet, Chew 10 mg by mouth daily., Disp: , Rfl:  diclofenac Sodium (VOLTAREN) 1 % GEL, Apply 1 application topically 2 (two) times daily as needed (pain)., Disp: , Rfl:    fluticasone (FLONASE) 50 MCG/ACT nasal spray, Place 1 spray into both nostrils daily., Disp: , Rfl:    hydrochlorothiazide (HYDRODIURIL) 25 MG tablet, Take 25 mg by mouth daily., Disp: , Rfl:    lidocaine-prilocaine (EMLA) cream, Apply to affected area once, Disp: 30 g, Rfl: 3   Mepolizumab (NUCALA) 100 MG SOLR, Inject 100 mg into the skin every 30 (thirty) days., Disp: , Rfl:    montelukast (SINGULAIR) 10 MG tablet, Take 10 mg by mouth at bedtime., Disp: , Rfl:    pantoprazole (PROTONIX) 40 MG tablet, Take 40 mg by mouth daily., Disp: , Rfl:    potassium chloride SA (KLOR-CON) 20 MEQ tablet, Take 1 tablet (20 mEq total) by mouth daily., Disp: 14  tablet, Rfl: 0   roflumilast (DALIRESP) 500 MCG TABS tablet, Take 500 mcg by mouth daily., Disp: , Rfl:    simvastatin (ZOCOR) 20 MG tablet, Take by mouth., Disp: , Rfl:    tiotropium (SPIRIVA) 18 MCG inhalation capsule, Place 18 mcg into inhaler and inhale daily., Disp: , Rfl:    traMADol (ULTRAM) 50 MG tablet, Take 50 mg by mouth 2 (two) times daily., Disp: , Rfl:    sucralfate (CARAFATE) 1 g tablet, Take by mouth. (Patient not taking: Reported on 10/24/2021), Disp: , Rfl:    tamsulosin (FLOMAX) 0.4 MG CAPS capsule, TAKE 1 CAPSULE BY MOUTH EVERY DAY (Patient not taking: Reported on 10/24/2021), Disp: 90 capsule, Rfl: 3  Current Facility-Administered Medications:    gemcitabine (GEMZAR) chemo syringe for bladder instillation 2,000 mg, 2,000 mg, Bladder Instillation, Once, Hollice Espy, MD  Physical exam:  Vitals:   10/24/21 1038  BP: 130/85  Pulse: 85  Resp: 18  Temp: 98.1 F (36.7 C)  SpO2: 97%  Weight: 154 lb 11.2 oz (70.2 kg)   Physical Exam Constitutional:      General: He is not in acute distress. Cardiovascular:     Rate and Rhythm: Normal rate and regular rhythm.     Heart sounds: Normal heart sounds.  Pulmonary:     Comments: On home oxygen.  Effort of breathing increased.  Scattered bilateral wheezing Abdominal:     General: Bowel sounds are normal.     Palpations: Abdomen is soft.  Skin:    General: Skin is warm and dry.  Neurological:     Mental Status: He is alert and oriented to person, place, and time.         Latest Ref Rng & Units 10/23/2021   11:19 AM  CMP  Creatinine 0.61 - 1.24 mg/dL 0.90       Latest Ref Rng & Units 04/24/2021   10:11 AM  CBC  WBC 4.0 - 10.5 K/uL 5.9   Hemoglobin 13.0 - 17.0 g/dL 13.7   Hematocrit 39.0 - 52.0 % 40.8   Platelets 150 - 400 K/uL 228      Assessment and plan- Patient is a 76 y.o. 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  He is s/p concurrent chemoradiation with 5-FU mitomycin ending in  March 2022 when he is here for routine follow-up  Patient continues to follow-up with Dr. Erlene Quan and underwent a recent surveillance cystoscopy which showed evidence of radiation cystitis.  Patient does not report any symptoms secondary to it.I have reviewed his CT chest abdomen pelvis images independently and discussed findings with the patient  which does not show any evidence of progressive disease with awaiting final report at this time.  I will see him back in 6 months time with labs and scans prior.  Patient wishes to keep his port and gets it flushed every 3 months.  Baseline asthma/COPD: He has bilateral wheezing on today's exam.  I have asked him to get in touch with his pulmonary at [facility] for further recommendations   Visit Diagnosis 1. Encounter for follow-up surveillance of bladder cancer      Dr. Randa Evens, MD, MPH [facility] [phone] 10/24/2021 11:08 AM

## 2021-10-30 ENCOUNTER — Other Ambulatory Visit: Payer: Self-pay | Admitting: Gastroenterology

## 2021-10-30 DIAGNOSIS — R1013 Epigastric pain: Secondary | ICD-10-CM

## 2021-10-30 DIAGNOSIS — K219 Gastro-esophageal reflux disease without esophagitis: Secondary | ICD-10-CM

## 2021-11-07 ENCOUNTER — Ambulatory Visit
Admission: RE | Admit: 2021-11-07 | Discharge: 2021-11-07 | Disposition: A | Discharge status: Home / Self Care | Payer: Medicare HMO | Source: Ambulatory Visit | Attending: Gastroenterology | Admitting: Gastroenterology

## 2021-11-07 DIAGNOSIS — K219 Gastro-esophageal reflux disease without esophagitis: Secondary | ICD-10-CM | POA: Diagnosis present

## 2021-11-07 DIAGNOSIS — R1013 Epigastric pain: Secondary | ICD-10-CM | POA: Insufficient documentation

## 2021-12-03 ENCOUNTER — Other Ambulatory Visit: Payer: Self-pay

## 2021-12-03 NOTE — Progress Notes (Signed)
   12/04/21 CC:  Chief Complaint  Patient presents with   Cysto      HPI: Joe Torres is a 76 y.o. male with a personal history of muscle invasive bladder cancer , who returns today for surveillance cystoscopy.  He was taken to the OR for TURBT on 05/22/2020 at which time several overlapping posterior bladder wall tumors were identified, at least 2.5 cm as well as a more worrisome irregular nodular tumor in the left lateral bladder wall which was highly suspicious for muscle invasive cancer.   He is now status post chemo 5-FU / radiation.   He had a recent CT chest, abd, and pelvis scan on 10/23/2021 that visualized mild, nonfocal bladder wall thickening. No evidence of lymphadenopathy or metastatic disease in the chest, abdomen, or pelvis.   He is accompanied by a [place] interpreter.   Urine today with 11-30 red blood cells, otherwise negative.  He denies any urinary issues.  Vitals:   12/04/21 1048  BP: 116/76  Pulse: (!) 105   NED. A&Ox3.   No respiratory distress   Abd soft, NT, ND Normal phallus with bilateral descended testicles  Cystoscopy Procedure Note  Patient identification was confirmed, informed consent was obtained, and patient was prepped using Betadine solution.  Lidocaine jelly was administered per urethral meatus.     Pre-Procedure: - Inspection reveals a normal caliber ureteral meatus.  Procedure: The flexible cystoscope was introduced without difficulty - No urethral strictures/lesions are present. - Enlarged prostate  - Normal bladder neck - Bilateral ureteral orifices identified - No bladder stones - moderate trabeculation left lateral bladder wall 2 cm area of erythema slight bullous consistent with radiation changes, unchanged from previous cysto - TURBT resection scar on base of bladder moderately trabeculated  Retroflexion shows left lateral bladder wall 2 cm area of erythema slight bullous    Post-Procedure: - Patient tolerated the  procedure well   Assessment/ Plan:  History of bladder cancer - he remains stable since last visit. He is still a poor candidate for biopsy.  -Cysto overall stable and visually most consistent with radiation cystitis rather than recurrent TCC - Continue  fairly close surveillance cystoscopies every 4 months.  - Will send urine for cytology. If cytology returns abnormal he may benefit from delayed imaging CT urogram at next CT scan interval    2. Radiation cystis  - Stable since last cytoscopy   3. Microscopic hematuria  - Has some uroscopic hematuria in UA today  -He another CT scheduled in 04/2022 will further evaluate with that scan, consider CT urogram as above if hematuria progresses  F/u 4 months  I,Jeovanny Cuadros,acting as a scribe for Hollice Espy, MD.,have documented all relevant documentation on the behalf of Hollice Espy, MD,as directed by  Hollice Espy, MD while in the presence of Hollice Espy, MD.  I have reviewed the above documentation for accuracy and completeness, and I agree with the above.   Hollice Espy, MD

## 2021-12-04 ENCOUNTER — Encounter: Payer: Self-pay | Admitting: Urology

## 2021-12-04 ENCOUNTER — Ambulatory Visit (INDEPENDENT_AMBULATORY_CARE_PROVIDER_SITE_OTHER): Payer: Medicare HMO | Admitting: Urology

## 2021-12-04 VITALS — BP 116/76 | HR 105 | Ht 67.0 in | Wt 154.0 lb

## 2021-12-04 DIAGNOSIS — Z8551 Personal history of malignant neoplasm of bladder: Secondary | ICD-10-CM | POA: Diagnosis not present

## 2021-12-04 DIAGNOSIS — R3129 Other microscopic hematuria: Secondary | ICD-10-CM

## 2021-12-04 DIAGNOSIS — C674 Malignant neoplasm of posterior wall of bladder: Secondary | ICD-10-CM

## 2021-12-04 LAB — URINALYSIS, COMPLETE
Bilirubin, UA: NEGATIVE
Glucose, UA: NEGATIVE
Ketones, UA: NEGATIVE
Leukocytes,UA: NEGATIVE
Nitrite, UA: NEGATIVE
Protein,UA: NEGATIVE
Specific Gravity, UA: 1.015 (ref 1.005–1.030)
Urobilinogen, Ur: 0.2 mg/dL (ref 0.2–1.0)
pH, UA: 5.5 (ref 5.0–7.5)

## 2021-12-04 LAB — MICROSCOPIC EXAMINATION: Bacteria, UA: NONE SEEN

## 2021-12-06 LAB — CYTOLOGY - NON PAP

## 2021-12-11 ENCOUNTER — Other Ambulatory Visit: Payer: Self-pay

## 2021-12-14 ENCOUNTER — Other Ambulatory Visit: Payer: Self-pay | Admitting: Oncology

## 2021-12-14 NOTE — Telephone Encounter (Signed)
Pt has not had a recent potassium level and the last time that it was filled was in 2022 and it was for 14 tablets and that was over a year and the RX that they have sent is 104 tablets. The request is denied

## 2022-01-21 ENCOUNTER — Other Ambulatory Visit: Payer: Self-pay | Admitting: Oncology

## 2022-01-21 NOTE — Telephone Encounter (Signed)
Component Ref Range & Units 9 mo ago (04/24/21) 12 mo ago (01/23/21) 1 yr ago (10/10/20) 1 yr ago (08/10/20) 1 yr ago (07/18/20) 1 yr ago (07/03/20) 1 yr ago (06/27/20)  Potassium 3.5 - 5.1 mmol/L 3.0 Low   3.2 Low   3.1 Low   3.6

## 2022-01-24 ENCOUNTER — Inpatient Hospital Stay: Payer: Medicare HMO | Attending: Oncology

## 2022-01-24 DIAGNOSIS — Z452 Encounter for adjustment and management of vascular access device: Secondary | ICD-10-CM | POA: Insufficient documentation

## 2022-01-24 DIAGNOSIS — C679 Malignant neoplasm of bladder, unspecified: Secondary | ICD-10-CM | POA: Diagnosis present

## 2022-01-24 DIAGNOSIS — Z95828 Presence of other vascular implants and grafts: Secondary | ICD-10-CM

## 2022-01-24 MED ORDER — SODIUM CHLORIDE 0.9% FLUSH
10.0000 mL | Freq: Once | INTRAVENOUS | Status: AC
  Administered 2022-01-24: 10 mL via INTRAVENOUS
  Filled 2022-01-24: qty 10

## 2022-01-24 MED ORDER — HEPARIN SOD (PORK) LOCK FLUSH 100 UNIT/ML IV SOLN
500.0000 [IU] | Freq: Once | INTRAVENOUS | Status: AC
  Administered 2022-01-24: 500 [IU] via INTRAVENOUS
  Filled 2022-01-24: qty 5

## 2022-02-01 IMAGING — CT CT CHEST-ABD-PELV W/ CM
3 of 8 series · 9 of 46 positions shown, 15 images · IV contrast (APPLIED)
Comparison: April 25, 2020

CLINICAL DATA: Restaging of bladder cancer in a 74-year-old male.

EXAM:
CT CHEST, ABDOMEN, AND PELVIS WITH CONTRAST
TECHNIQUE: Multidetector CT imaging of the chest, abdomen and pelvis was
performed following the standard protocol during bolus
administration of intravenous contrast.
CONTRAST:  100mL OMNIPAQUE IOHEXOL 300 MG/ML  SOLN

[Series 2: axial post · axial · 0.77mm/px · z∈[-524,-154]mm · 4 of 124 slices shown, 9 images]
[im 25/124  soft-tissue]
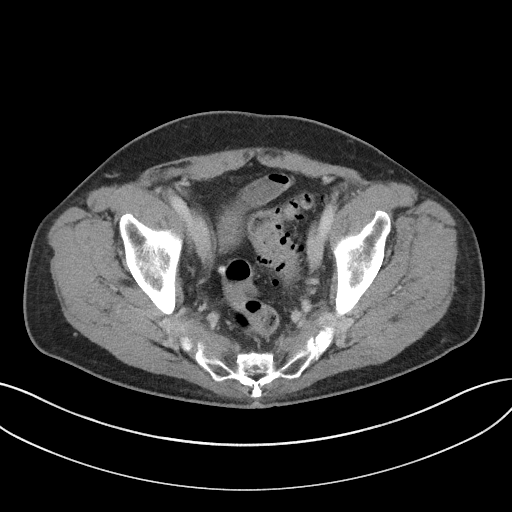
[im 25/124  lung]
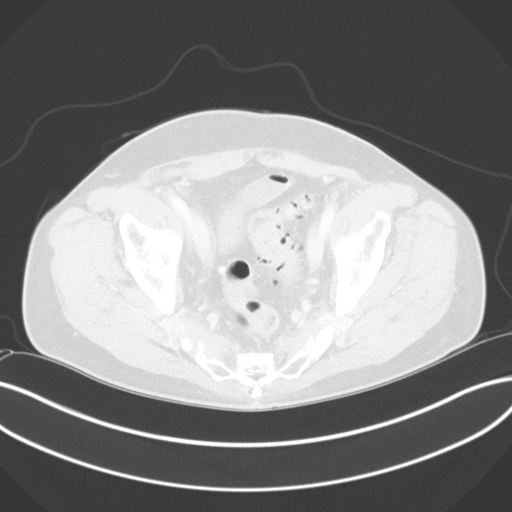
[im 25/124  bone]
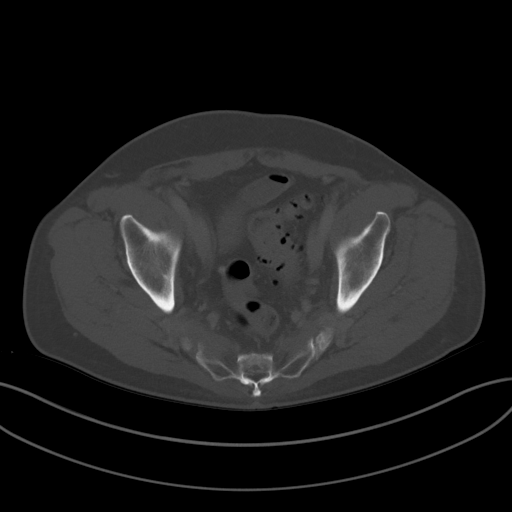
[im 50/124  soft-tissue]
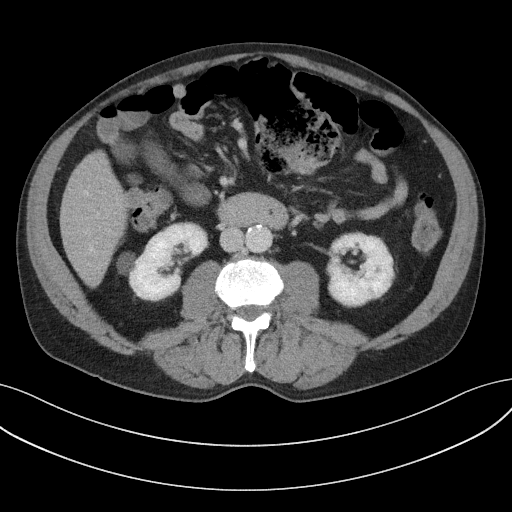
[im 50/124  lung]
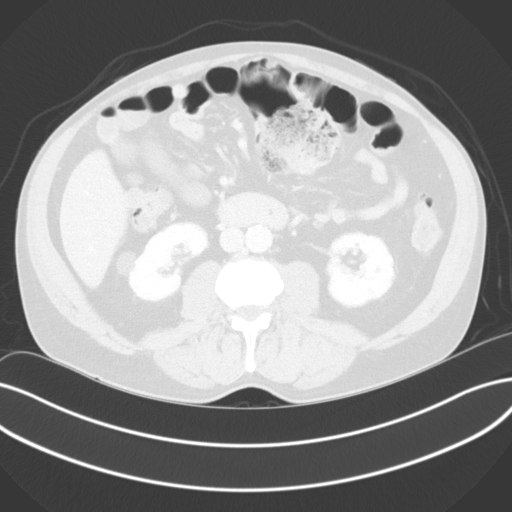
[im 74/124  soft-tissue]
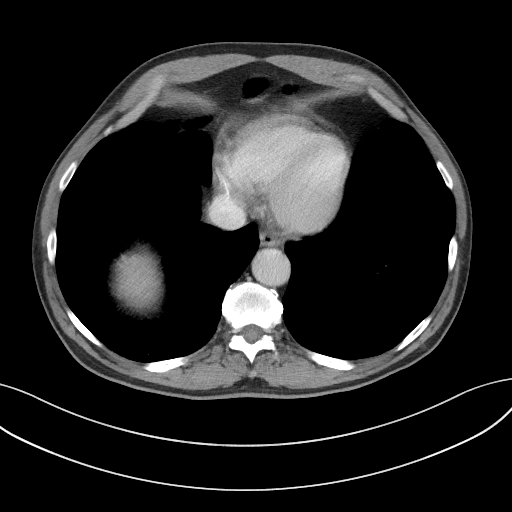
[im 74/124  lung]
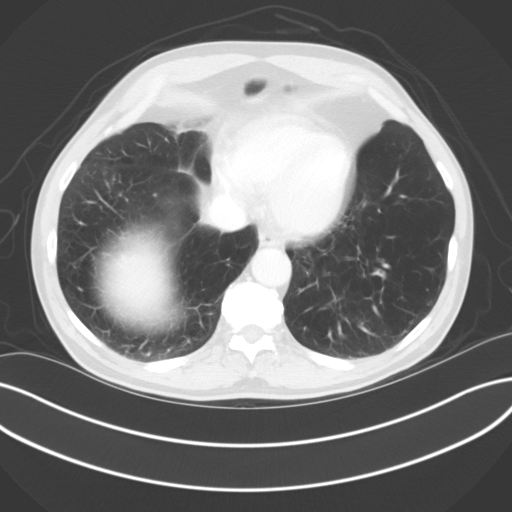
[im 99/124  soft-tissue]
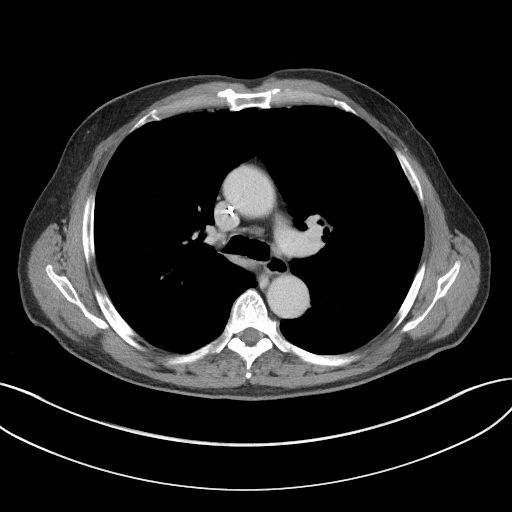
[im 99/124  lung]
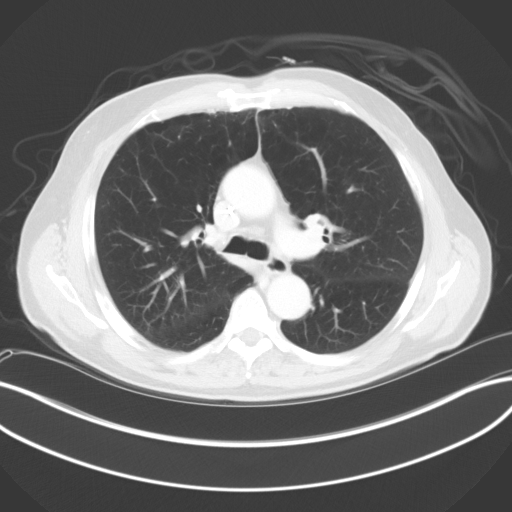

[Series 4: coronal post · coronal · 0.99mm/px · 3 of 94 slices shown, 4 images]
[im 24/94  soft-tissue]
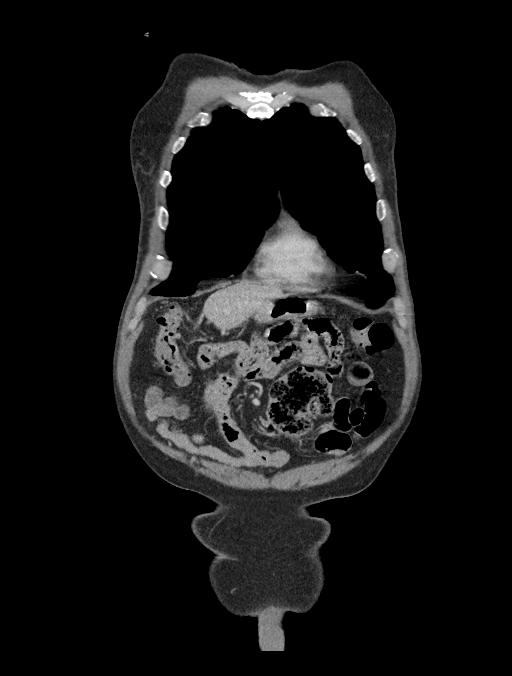
[im 47/94  soft-tissue]
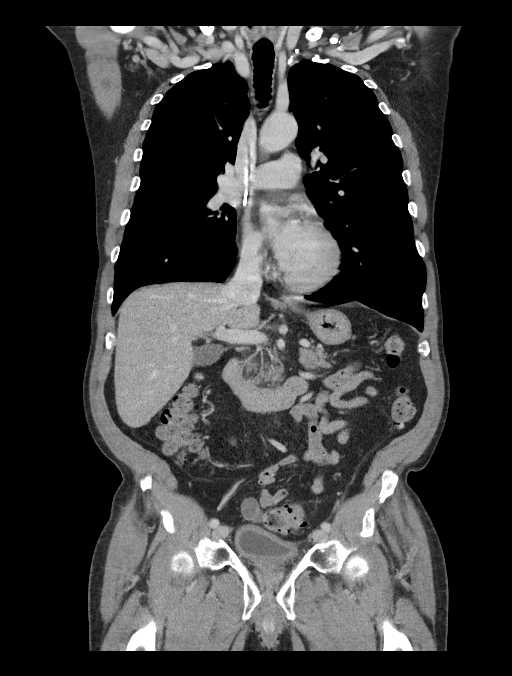
[im 47/94  bone]
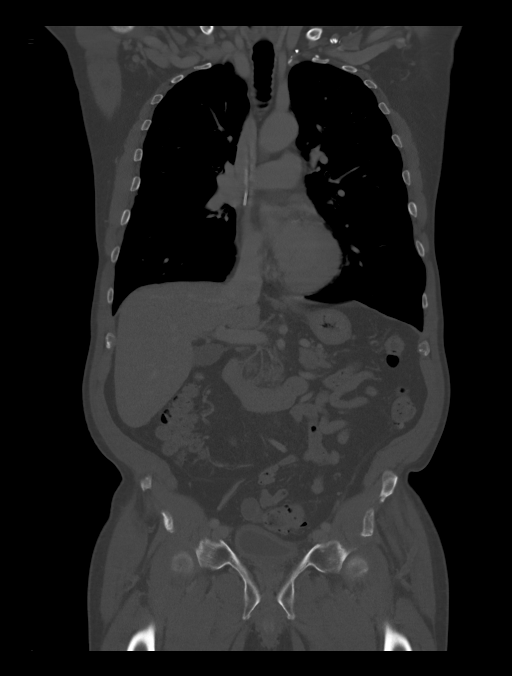
[im 70/94  soft-tissue]
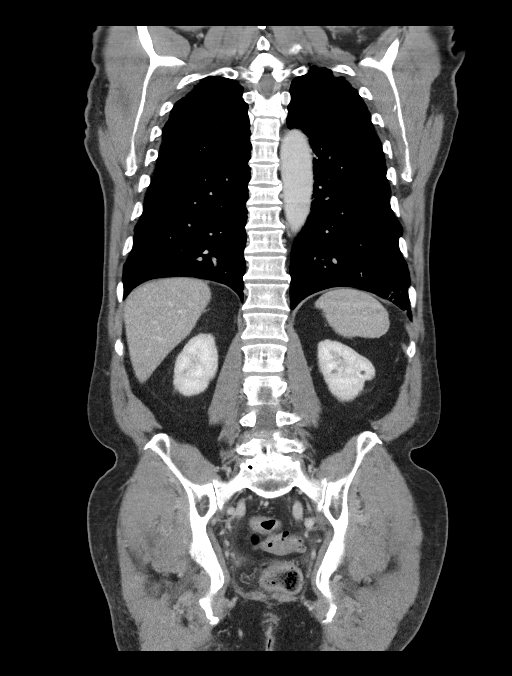

[Series 8: axial delay · axial · delayed · 0.89mm/px · z∈[-402,-292]mm · 2 of 90 slices shown]
[im 23/90  soft-tissue]
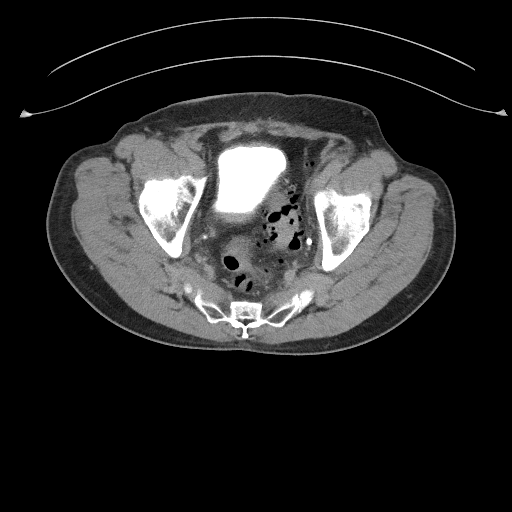
[im 45/90  soft-tissue]
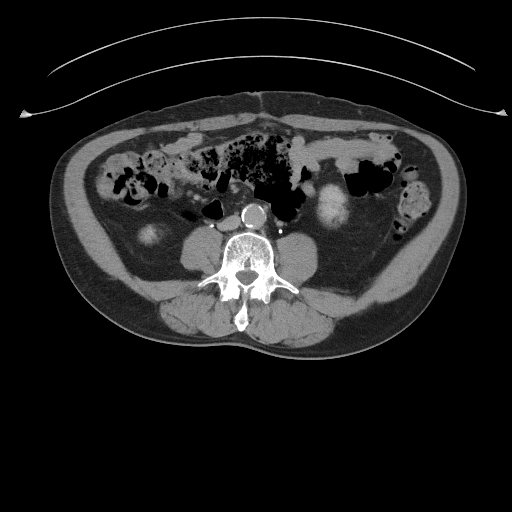

[9 of 46 positions shown; findings below may reference images not displayed]

FINDINGS: CT CHEST FINDINGS

Cardiovascular: RIGHT-sided Port-A-Cath terminates at the caval to
atrial junction. Heart size is stable without substantial
pericardial fluid. Calcified coronary artery disease and generalized
calcified atheromatous plaque of the thoracic aorta without
aneurysm. Central pulmonary vasculature is normal caliber.

Mediastinum/Nodes: No axillary, hilar, thoracic inlet or mediastinal
lymphadenopathy. Esophagus grossly normal.

Lungs/Pleura: Pulmonary emphysema worse at the lung apices, moderate
to marked in severity. Basilar atelectasis. Airways are patent. No
effusion or sign of consolidation. No suspicious pulmonary nodule
with calcified granulomata in the upper lobes.

A 10 x 8 mm area of nodularity at the posterior RIGHT lung base with
some surrounding septal thickening is a new finding compared to
previous imaging from Sunday April, 2020. There is some bronchial wall
thickening in there are inspissated secretions in lower lobe bronchi
to the LEFT lung base. This persists on prone imaging.

Musculoskeletal: See below for full details regarding
musculoskeletal structures.

CT ABDOMEN PELVIS FINDINGS

Hepatobiliary: Tiny hypodensity in the RIGHT hepatic lobe inferiorly
in hepatic subsegment VI likely unchanged since previous imaging.
Signs of hepatic steatosis. The portal vein is patent. No
pericholecystic stranding. Cholelithiasis. No biliary duct dilation.

Pancreas: Normal, without mass, inflammation or ductal dilatation.

Spleen: Spleen normal size and contour.

Adrenals/Urinary Tract: Adrenal glands are normal.

There is symmetric renal enhancement with renal cortical scarring
and small low-density lesions in the bilateral kidneys that are
unchanged since previous imaging no hydronephrosis. No perinephric
stranding. Urinary bladder is collapsed with mild irregularity of
the bladder wall but without discrete lesion on limited assessment
due to under distension.

Excretory images without signs of upper tract lesion or secondary
sign of stricture.

Stomach/Bowel: Stomach unremarkable. Small bowel without acute
process. Cecum in the midline as on the previous study may indicate
cecal bascule though without signs of stranding or evidence of
obstruction about the cecum or proximal colon.

Diverticular disease of the sigmoid. Subtle surrounding stranding.
No abscess. No free intraperitoneal air. Findings are quite subtle
but with some increased thickening since the most recent comparison
evaluation. Appendix not visualized though there are no secondary
signs to suggest acute appendicitis.

Vascular/Lymphatic: Calcified atheromatous plaque of the abdominal
aorta with normal caliber. Smooth contour of the IVC.

There is no gastrohepatic or hepatoduodenal ligament
lymphadenopathy. No retroperitoneal or mesenteric lymphadenopathy.

No pelvic sidewall lymphadenopathy.

Reproductive: Unremarkable by CT.

Other: No ascites.  No free air.

Musculoskeletal: Spinal degenerative changes. No acute or
destructive bone process.
IMPRESSION: 1. No visible bladder lesion or upper tract lesion. No signs of
metastatic disease in the chest, abdomen or pelvis.
2. A 10 x 8 mm area of nodularity at the posterior RIGHT lung base
with some surrounding septal thickening is a new finding compared to
previous imaging from Sunday April, 2020. Some findings with respect
to bronchi subtending this area suggests this may be inflammatory
perhaps related even to aspiration change. Would suggest 8-12 week
follow-up and correlate with any signs of respiratory illness.
3. Mild stranding about the sigmoid in the pelvis, this could relate
to ongoing radiotherapy or low level diverticulitis. Correlate with
any pelvic symptoms.
4. Cholelithiasis.
5. Signs of hepatic steatosis.
6. Calcified coronary artery disease and aortic atherosclerosis.
7. Emphysema and aortic atherosclerosis.

Aortic Atherosclerosis (VSMSX-TNR.R) and Emphysema (VSMSX-APS.T).

## 2022-03-11 ENCOUNTER — Encounter (INDEPENDENT_AMBULATORY_CARE_PROVIDER_SITE_OTHER): Payer: Self-pay

## 2022-04-02 ENCOUNTER — Encounter: Payer: Self-pay | Admitting: Urology

## 2022-04-02 ENCOUNTER — Ambulatory Visit (INDEPENDENT_AMBULATORY_CARE_PROVIDER_SITE_OTHER): Payer: Medicare HMO | Admitting: Urology

## 2022-04-02 VITALS — BP 149/82 | HR 106 | Ht 67.0 in | Wt 154.0 lb

## 2022-04-02 DIAGNOSIS — R3129 Other microscopic hematuria: Secondary | ICD-10-CM

## 2022-04-02 DIAGNOSIS — Z8551 Personal history of malignant neoplasm of bladder: Secondary | ICD-10-CM

## 2022-04-02 DIAGNOSIS — R339 Retention of urine, unspecified: Secondary | ICD-10-CM

## 2022-04-02 LAB — MICROSCOPIC EXAMINATION

## 2022-04-02 LAB — URINALYSIS, COMPLETE
Bilirubin, UA: NEGATIVE
Glucose, UA: NEGATIVE
Ketones, UA: NEGATIVE
Leukocytes,UA: NEGATIVE
Nitrite, UA: NEGATIVE
Protein,UA: NEGATIVE
RBC, UA: NEGATIVE
Specific Gravity, UA: 1.02 (ref 1.005–1.030)
Urobilinogen, Ur: 0.2 mg/dL (ref 0.2–1.0)
pH, UA: 5 (ref 5.0–7.5)

## 2022-04-02 NOTE — Progress Notes (Signed)
   04/02/22 CC:  Chief Complaint  Patient presents with   Cysto     HPI: Joe Torres is a 76 y.o. male with a personal history of muscle invasive bladder cancer , who returns today for surveillance cystoscopy.  He was taken to the OR for TURBT on 05/22/2020 at which time several overlapping posterior bladder wall tumors were identified, at least 2.5 cm as well as a more worrisome irregular nodular tumor in the left lateral bladder wall which was highly suspicious for muscle invasive cancer.   He is now status post chemo 5-FU / radiation.   He had a recent CT chest, abd, and pelvis scan on 10/23/2021 that visualized mild, nonfocal bladder wall thickening. No evidence of lymphadenopathy or metastatic disease in the chest, abdomen, or pelvis.   Urine today negative.  He denies any urinary issues.  Schedule for CT scan with Dr. Janese Banks 04/25/22  Vitals:   04/02/22 1108  BP: (!) 149/82  Pulse: (!) 106    NED. A&Ox3.   No respiratory distress   Abd soft, NT, ND Normal phallus with bilateral descended testicles  Cystoscopy Procedure Note  Patient identification was confirmed, informed consent was obtained, and patient was prepped using Betadine solution.  Lidocaine jelly was administered per urethral meatus.     Pre-Procedure: - Inspection reveals a normal caliber ureteral meatus.  Procedure: The flexible cystoscope was introduced without difficulty - No urethral strictures/lesions are present. - Enlarged prostate  - Normal bladder neck - Bilateral ureteral orifices identified - No bladder stones - moderate trabeculation - TURBT resection scar on base of bladder moderately trabeculated  Post-Procedure: - Patient tolerated the procedure well   Assessment/ Plan:  History of bladder cancer -Cystoscopy today is unremarkable, bullous edema and changes previously felt to be related to radiation or not appreciated today - Continue  fairly close surveillance cystoscopies every  4 months.  - Will send urine for cytology. If cytology returns abnormal he may benefit from delayed imaging CT urogram at next CT scan interval     F/u 4 months cysot   Hollice Espy, MD

## 2022-04-05 LAB — CYTOLOGY - NON PAP

## 2022-04-24 ENCOUNTER — Ambulatory Visit
Admission: RE | Admit: 2022-04-24 | Discharge: 2022-04-24 | Disposition: A | Discharge status: Home / Self Care | Payer: Medicare HMO | Source: Ambulatory Visit | Attending: Oncology | Admitting: Oncology

## 2022-04-24 DIAGNOSIS — Z08 Encounter for follow-up examination after completed treatment for malignant neoplasm: Secondary | ICD-10-CM | POA: Insufficient documentation

## 2022-04-24 DIAGNOSIS — Z8551 Personal history of malignant neoplasm of bladder: Secondary | ICD-10-CM | POA: Diagnosis present

## 2022-04-24 LAB — POCT I-STAT CREATININE: Creatinine, Ser: 1 mg/dL (ref 0.61–1.24)

## 2022-04-24 MED ORDER — IOHEXOL 300 MG/ML  SOLN
100.0000 mL | Freq: Once | INTRAMUSCULAR | Status: AC | PRN
  Administered 2022-04-24: 100 mL via INTRAVENOUS

## 2022-04-25 ENCOUNTER — Other Ambulatory Visit: Payer: Medicare HMO

## 2022-04-29 ENCOUNTER — Inpatient Hospital Stay: Payer: Medicare HMO

## 2022-04-29 ENCOUNTER — Inpatient Hospital Stay: Payer: Medicare HMO | Attending: Oncology | Admitting: Oncology

## 2022-04-29 ENCOUNTER — Encounter: Payer: Self-pay | Admitting: Oncology

## 2022-04-29 VITALS — BP 148/86 | HR 95 | Temp 97.7°F | Wt 157.0 lb

## 2022-04-29 DIAGNOSIS — Z8551 Personal history of malignant neoplasm of bladder: Secondary | ICD-10-CM | POA: Diagnosis not present

## 2022-04-29 DIAGNOSIS — Z923 Personal history of irradiation: Secondary | ICD-10-CM | POA: Insufficient documentation

## 2022-04-29 DIAGNOSIS — C679 Malignant neoplasm of bladder, unspecified: Secondary | ICD-10-CM | POA: Diagnosis present

## 2022-04-29 DIAGNOSIS — J4489 Other specified chronic obstructive pulmonary disease: Secondary | ICD-10-CM | POA: Insufficient documentation

## 2022-04-29 DIAGNOSIS — E876 Hypokalemia: Secondary | ICD-10-CM | POA: Diagnosis not present

## 2022-04-29 DIAGNOSIS — Z95828 Presence of other vascular implants and grafts: Secondary | ICD-10-CM

## 2022-04-29 DIAGNOSIS — Z9981 Dependence on supplemental oxygen: Secondary | ICD-10-CM | POA: Diagnosis not present

## 2022-04-29 DIAGNOSIS — I10 Essential (primary) hypertension: Secondary | ICD-10-CM | POA: Diagnosis not present

## 2022-04-29 DIAGNOSIS — Z87891 Personal history of nicotine dependence: Secondary | ICD-10-CM | POA: Insufficient documentation

## 2022-04-29 DIAGNOSIS — Z08 Encounter for follow-up examination after completed treatment for malignant neoplasm: Secondary | ICD-10-CM

## 2022-04-29 LAB — CBC WITH DIFFERENTIAL/PLATELET
Abs Immature Granulocytes: 0.24 10*3/uL — ABNORMAL HIGH (ref 0.00–0.07)
Basophils Absolute: 0.1 10*3/uL (ref 0.0–0.1)
Basophils Relative: 1 %
Eosinophils Absolute: 0 10*3/uL (ref 0.0–0.5)
Eosinophils Relative: 1 %
HCT: 42.5 % (ref 39.0–52.0)
Hemoglobin: 14 g/dL (ref 13.0–17.0)
Immature Granulocytes: 3 %
Lymphocytes Relative: 14 %
Lymphs Abs: 1 10*3/uL (ref 0.7–4.0)
MCH: 29.8 pg (ref 26.0–34.0)
MCHC: 32.9 g/dL (ref 30.0–36.0)
MCV: 90.4 fL (ref 80.0–100.0)
Monocytes Absolute: 0.6 10*3/uL (ref 0.1–1.0)
Monocytes Relative: 8 %
Neutro Abs: 5.3 10*3/uL (ref 1.7–7.7)
Neutrophils Relative %: 73 %
Platelets: 261 10*3/uL (ref 150–400)
RBC: 4.7 MIL/uL (ref 4.22–5.81)
RDW: 14.9 % (ref 11.5–15.5)
WBC: 7.2 10*3/uL (ref 4.0–10.5)
nRBC: 0 % (ref 0.0–0.2)

## 2022-04-29 LAB — COMPREHENSIVE METABOLIC PANEL
ALT: 35 U/L (ref 0–44)
AST: 22 U/L (ref 15–41)
Albumin: 3.5 g/dL (ref 3.5–5.0)
Alkaline Phosphatase: 59 U/L (ref 38–126)
Anion gap: 11 (ref 5–15)
BUN: 19 mg/dL (ref 8–23)
CO2: 27 mmol/L (ref 22–32)
Calcium: 8.5 mg/dL — ABNORMAL LOW (ref 8.9–10.3)
Chloride: 103 mmol/L (ref 98–111)
Creatinine, Ser: 0.9 mg/dL (ref 0.61–1.24)
GFR, Estimated: >60 mL/min (ref >60)
Glucose, Bld: 150 mg/dL — ABNORMAL HIGH (ref 70–99)
Potassium: 3.4 mmol/L — ABNORMAL LOW (ref 3.5–5.1)
Sodium: 141 mmol/L (ref 135–145)
Total Bilirubin: 0.6 mg/dL (ref 0.3–1.2)
Total Protein: 6.6 g/dL (ref 6.5–8.1)

## 2022-04-29 MED ORDER — SODIUM CHLORIDE 0.9% FLUSH
10.0000 mL | Freq: Once | INTRAVENOUS | Status: AC
  Administered 2022-04-29: 10 mL via INTRAVENOUS
  Filled 2022-04-29: qty 10

## 2022-04-29 MED ORDER — LIDOCAINE-PRILOCAINE 2.5-2.5 % EX CREA: TOPICAL_CREAM | CUTANEOUS | 3 refills | Status: AC

## 2022-04-29 MED ORDER — HEPARIN SOD (PORK) LOCK FLUSH 100 UNIT/ML IV SOLN
500.0000 [IU] | Freq: Once | INTRAVENOUS | Status: AC
  Administered 2022-04-29: 500 [IU] via INTRAVENOUS
  Filled 2022-04-29: qty 5

## 2022-04-29 NOTE — Progress Notes (Signed)
Hematology/Oncology Consult note [facility]  Telephone:([phone] [phone]  Patient Care Team: Center, [facility] as PCP - General (General Practice) Noreene Filbert, MD as Consulting Physician (Radiation Oncology) Sindy Guadeloupe, MD as Consulting Physician (Oncology) Hollice Espy, MD as Consulting Physician (Urology) Caren Hazy, MD as Consulting Physician (Cardiology) [name], MD as Consulting Physician (Gastroenterology) Venia Carbon, MD as Consulting Physician (Pulmonary Disease) Lucky Cowboy Erskine Squibb, MD as Consulting Physician (Vascular Surgery)   Name of the patient: Joe Torres  [id number]  [birth year]   Date of visit: 04/29/22  Diagnosis- 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  He presented with symptoms of gross hematuria and underwent CT urogram which showed 2 lesions in the bladder 1 measuring 2.1 x 2.1 cm in the posterior bladder wall as well as an eccentric mass just left of the midline measuring 1.6 x 1.1 cm.  Patient was seen by urology and underwent cystoscopy.  Visualization was limited due to gross hematuria.  Large at least 2 cm spherical high-grade appearing tumor in the midline posterior bladder wall.  This was followed by TURBT which showed a 2.5 cm posterior bladder tumor appearing necrotic/hemorrhagic another 2 cm nodular area which had a submucosal solid infiltrative appearance.  Significant bladder varices and hypervascularity surrounding tumors.  Pathology showed invasive urothelial carcinoma high-grade with high suspicion for muscularis propria involvement.  Scattered small fragments of muscularis propria identified an invasive tumor was seen adjacent to these muscle bundles.   However unequivocal muscle invasion was not readily apparent.  Patient could not urinate following his TURBT and required temporary placement of Foley catheter but was able to pass his trial of void later.  Risk of repeat TURBT was discussed and mutual consensus was to avoid another invasive procedure given his oxygen dependent COPD.  Case discussed at tumor board.  He was not deemed to be a good candidate for cystoprostatectomy.  Based on the appearance of the tumor at the time of TURBT as well as pathology, it was deemed that there was a high risk of muscle invasion.    Patient completed concurrent chemoradiation to his bladder with 5-FU mitomycin regimen in March 2022.  Interval history-patient feels at his baseline state of health.  He has baseline shortness of breath and is on home oxygen from COPD.  Denies any new complaints at this time  ECOG PS- 2 Pain scale- 0   Review of systems- Review of Systems  Constitutional:  Positive for malaise/fatigue. Negative for chills, fever and weight loss.  HENT:  Negative for congestion, ear discharge and nosebleeds.   Eyes:  Negative for blurred vision.  Respiratory:  Positive for shortness of breath. Negative for cough, hemoptysis, sputum production and wheezing.   Cardiovascular:  Negative for chest pain, palpitations, orthopnea and claudication.  Gastrointestinal:  Negative for abdominal pain, blood in stool, constipation, diarrhea, heartburn, melena, nausea and vomiting.  Genitourinary:  Negative for dysuria, flank pain, frequency, hematuria and urgency.  Musculoskeletal:  Negative for back pain, joint pain and myalgias.  Skin:  Negative for rash.  Neurological:  Negative for dizziness, tingling, focal weakness, seizures, weakness and headaches.  Endo/Heme/Allergies:  Does not bruise/bleed easily.  Psychiatric/Behavioral:  Negative for depression and suicidal ideas. The patient does  not have insomnia.       Allergies  Allergen Reactions    Shrimp [Shellfish Allergy] Other (See Comments)    Per MD comments critical     Past Medical History:  Diagnosis Date   Arthritis    Asthma    Bladder cancer ([place])    Cancer ([place])    COPD (chronic obstructive pulmonary disease) ([facility])    Dyspnea    GERD (gastroesophageal reflux disease)    Hypertension      Past Surgical History:  Procedure Laterality Date   COLONOSCOPY     COLONOSCOPY WITH PROPOFOL N/A 01/16/2021   Procedure: COLONOSCOPY WITH PROPOFOL;  Surgeon: [name], MD;  Location[facility] ENDOSCOPY;  Service: Gastroenterology;  Laterality: N/A;  SPANISH INTERPRETER 9 AM ARRIVAL REQUEST   HERNIA REPAIR  2017   PORTA CATH INSERTION N/A 06/19/2020   Procedure: PORTA CATH INSERTION;  Surgeon: Algernon Huxley, MD;  Location: [facility];  Service: Cardiovascular;  Laterality: N/A;   TRANSURETHRAL RESECTION OF BLADDER TUMOR WITH GYRUS (TURBT-GYRUS)  07/2020   TRANSURETHRAL RESECTION OF BLADDER TUMOR WITH MITOMYCIN-C N/A 05/22/2020   Procedure: TRANSURETHRAL RESECTION OF BLADDER TUMOR WITH Gemcitabine;  Surgeon: Hollice Espy, MD;  Location: [facility] ORS;  Service: Urology;  Laterality: N/A;    Social History   Socioeconomic History   Marital status: Married    Spouse name: Not on file   Number of children: Not on file   Years of education: Not on file   Highest education level: Not on file  Occupational History   Not on file  Tobacco Use   Smoking status: Former    Packs/day: 1.50    Years: 40.00    Total pack years: 60.00    Types: Cigarettes    Quit date: 05/21/2008    Years since quitting: 13.9    Passive exposure: Past   Smokeless tobacco: Never  Vaping Use   Vaping Use: Never used  Substance and Sexual Activity   Alcohol use: Not Currently   Drug use: Never   Sexual activity: Not Currently  Other Topics Concern   Not on file  Social History Narrative   Not on file   Social Determinants of Health   Financial Resource Strain: Not on file  Food  Insecurity: Not on file  Transportation Needs: Not on file  Physical Activity: Not on file  Stress: Not on file  Social Connections: Not on file  Intimate Partner Violence: Not on file    Family History  Problem Relation Age of Onset   Asthma Mother      Current Outpatient Medications:    acetaminophen (TYLENOL) 500 MG tablet, Take 500 mg by mouth every 8 (eight) hours as needed for moderate pain., Disp: , Rfl:    albuterol (PROVENTIL HFA;VENTOLIN HFA) 108 (90 Base) MCG/ACT inhaler, Inhale 2 puffs into the lungs every 4 (four) hours as needed for wheezing or shortness of breath., Disp: , Rfl:    albuterol (PROVENTIL) (2.5 MG/3ML) 0.083% nebulizer solution, Take 2.5 mg by nebulization every 4 (four) hours as needed for wheezing or shortness of breath., Disp: , Rfl:    amLODipine (NORVASC) 10 MG tablet, Take 10 mg by mouth daily., Disp: , Rfl:    arformoterol (BROVANA) 15 MCG/2ML NEBU, Take 15 mcg by nebulization 2 (two) times daily., Disp: , Rfl:    budesonide (PULMICORT) 0.5 MG/2ML nebulizer solution, Take 0.5 mg by nebulization 2 (two) times daily., Disp: , Rfl:    cetirizine (ZYRTEC)  10 MG tablet, Chew 10 mg by mouth daily., Disp: , Rfl:    diclofenac Sodium (VOLTAREN) 1 % GEL, Apply 1 application topically 2 (two) times daily as needed (pain)., Disp: , Rfl:    Dupilumab 300 MG/2ML SOPN, Inject into the skin., Disp: , Rfl:    fluticasone (FLONASE) 50 MCG/ACT nasal spray, Place 1 spray into both nostrils daily., Disp: , Rfl:    hydrochlorothiazide (HYDRODIURIL) 25 MG tablet, Take 25 mg by mouth daily., Disp: , Rfl:    KLOR-CON M20 20 MEQ tablet, TAKE 1 TABLET BY MOUTH TWICE A DAY FOR 14 DAYS, FOLLOWED BY 1 TABLET DAILY, Disp: 104 tablet, Rfl: 2   montelukast (SINGULAIR) 10 MG tablet, Take 10 mg by mouth at bedtime., Disp: , Rfl:    pantoprazole (PROTONIX) 40 MG tablet, Take 40 mg by mouth daily., Disp: , Rfl:    roflumilast (DALIRESP) 500 MCG TABS tablet, Take 500 mcg by mouth daily.,  Disp: , Rfl:    simvastatin (ZOCOR) 20 MG tablet, Take by mouth., Disp: , Rfl:    sucralfate (CARAFATE) 1 g tablet, Take by mouth., Disp: , Rfl:    tamsulosin (FLOMAX) 0.4 MG CAPS capsule, TAKE 1 CAPSULE BY MOUTH EVERY DAY, Disp: 90 capsule, Rfl: 3   tiotropium (SPIRIVA) 18 MCG inhalation capsule, Place 18 mcg into inhaler and inhale daily., Disp: , Rfl:    traMADol (ULTRAM) 50 MG tablet, Take 50 mg by mouth 2 (two) times daily., Disp: , Rfl:    lidocaine-prilocaine (EMLA) cream, Apply to affected area once, Disp: 30 g, Rfl: 3  Current Facility-Administered Medications:    gemcitabine (GEMZAR) chemo syringe for bladder instillation 2,000 mg, 2,000 mg, Bladder Instillation, Once, Hollice Espy, MD  Physical exam:  Vitals:   04/29/22 1033  BP: (!) 148/86  Pulse: 95  Temp: 97.7 F (36.5 C)  TempSrc: Tympanic  SpO2: 97%  Weight: 157 lb (71.2 kg)   Physical Exam Constitutional:      General: He is not in acute distress.    Comments: Sitting in a wheelchair.  He is on home oxygen  Cardiovascular:     Rate and Rhythm: Normal rate and regular rhythm.     Heart sounds: Normal heart sounds.  Pulmonary:     Effort: Pulmonary effort is normal.     Breath sounds: Normal breath sounds.  Abdominal:     General: Bowel sounds are normal.     Palpations: Abdomen is soft.  Skin:    General: Skin is warm and dry.  Neurological:     Mental Status: He is alert and oriented to person, place, and time.         Latest Ref Rng & Units 04/29/2022   10:02 AM  CMP  Glucose 70 - 99 mg/dL 150   BUN 8 - 23 mg/dL 19   Creatinine 0.61 - 1.24 mg/dL 0.90   Sodium 135 - 145 mmol/L 141   Potassium 3.5 - 5.1 mmol/L 3.4   Chloride 98 - 111 mmol/L 103   CO2 22 - 32 mmol/L 27   Calcium 8.9 - 10.3 mg/dL 8.5   Total Protein 6.5 - 8.1 g/dL 6.6   Total Bilirubin 0.3 - 1.2 mg/dL 0.6   Alkaline Phos 38 - 126 U/L 59   AST 15 - 41 U/L 22   ALT 0 - 44 U/L 35       Latest Ref Rng & Units 04/29/2022    10:02 AM  CBC  WBC 4.0 -  10.5 K/uL 7.2   Hemoglobin 13.0 - 17.0 g/dL 14.0   Hematocrit 39.0 - 52.0 % 42.5   Platelets 150 - 400 K/uL 261     No images are attached to the encounter.  CT CHEST ABDOMEN PELVIS W CONTRAST  Result Date: 04/24/2022 CLINICAL DATA:  A 76 year old male presents for evaluation of urothelial neoplasm/bladder carcinoma. * Tracking Code: BO * EXAM: CT CHEST, ABDOMEN, AND PELVIS WITH CONTRAST TECHNIQUE: Multidetector CT imaging of the chest, abdomen and pelvis was performed following the standard protocol during bolus administration of intravenous contrast. RADIATION DOSE REDUCTION: This exam was performed according to the departmental dose-optimization program which includes automated exposure control, adjustment of the mA and/or kV according to patient size and/or use of iterative reconstruction technique. CONTRAST:  127m OMNIPAQUE IOHEXOL 300 MG/ML  SOLN COMPARISON:  October 23, 2021. FINDINGS: CT CHEST FINDINGS Cardiovascular: Calcified and noncalcified aortic atherosclerotic changes. No aneurysmal dilation. Normal heart size with three-vessel coronary artery calcifications. No pericardial effusion or nodularity. RIGHT-sided Port-A-Cath terminates at the caval to atrial junction. Mediastinum/Nodes: No thoracic inlet, axillary, mediastinal or hilar adenopathy. Esophagus grossly normal. Lungs/Pleura: Signs of pulmonary emphysema. Scattered pulmonary granulomata. No effusion. No consolidative changes. Basilar atelectasis. Scattered tree-in-bud nodularity new area in the anterior RIGHT lower lobe. No discrete suspicious pulmonary nodule. Mild bronchial wall thickening. Musculoskeletal: See below for full musculoskeletal details. No chest wall mass. CT ABDOMEN PELVIS FINDINGS Hepatobiliary: No focal, suspicious hepatic lesion. No pericholecystic stranding. No biliary duct dilation. Portal vein is patent. Pancreas: Normal, without mass, inflammation or ductal dilatation. Spleen: Normal.  Adrenals/Urinary Tract: Adrenal glands are unremarkable. Symmetric renal enhancement. No sign of hydronephrosis. No suspicious renal lesion or perinephric stranding. Benign renal cyst arises from the interpolar RIGHT kidney. Urinary bladder is decompressed limiting assessment. No gross measurable nodularity, mild thickening of the urinary bladder without change. Delayed phase imaging does not show opacification of the urinary bladder. Stomach/Bowel: No stranding adjacent to the stomach. No sign of small bowel obstruction. Abnormal position of the cecum crossing from RIGHT to LEFT in the abdomen is unchanged and not associated with surrounding stranding or twist of the colon or small bowel loops. Colonic diverticulosis. Vascular/Lymphatic: Aortic atherosclerosis. No sign of aneurysm. Smooth contour of the IVC. There is no gastrohepatic or hepatoduodenal ligament lymphadenopathy. No retroperitoneal or mesenteric lymphadenopathy. No pelvic sidewall lymphadenopathy. Reproductive: Mild prostatomegaly. Other: No ascites.  No peritoneal nodularity.  No free air. Musculoskeletal: No acute bone finding. No destructive bone process. Spinal degenerative changes. Excretory phase portion of the exam without upper tract abnormality. Urinary bladder with limited assessment due to under distension as outlined above. IMPRESSION: 1. No new or progressive findings. Mild generalized thickening of the urinary bladder without focal characteristics. 2. No evidence of metastatic disease to the chest, abdomen or pelvis. 3. Scattered areas of tree-in-bud opacity with some bronchial wall thickening compatible with infectious or inflammatory changes. Attention on follow-up and correlation with respiratory symptoms is suggested. 4. Aortic atherosclerosis. 5. Renal cortical scarring. 6. Abnormal positioning of the cecum crossing from RIGHT to LEFT in the central abdomen is unchanged. This implies lack of fusion of the ascending colon to the  retroperitoneum which can predispose patients to cecal volvulus. No current signs of acute process. 7. Emphysema and aortic atherosclerosis. Aortic Atherosclerosis (ICD10-I70.0) and Emphysema (ICD10-J43.9). Electronically Signed   By: GZetta BillsM.D.   On: 04/24/2022 17:21     Assessment and plan- Patient is a 76y.o. 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  He is s/p concurrent chemoradiation with 5-FU mitomycin ending in March 2022 is here for routine follow-up  I have reviewedCT chest abdomen pelvis images independently and discussed findings with the patient which does not show any evidence of recurrent or progressive disease.  He has also been following up with Dr. Erlene Quan closely and underwent a recent cystoscopy which was unremarkable.  Urine cytology showed atypical cells but did not show any evidence of malignancy.  I will see him back in 6 months with repeat scans.  Hypokalemia: Chronic: Continue oral potassium   Visit Diagnosis 1. Encounter for follow-up surveillance of bladder cancer   2. Hypokalemia      Dr. Randa Evens, MD, MPH [facility] [phone] 04/29/2022 2:23 PM

## 2022-05-23 ENCOUNTER — Inpatient Hospital Stay: Payer: Medicare HMO

## 2022-05-23 ENCOUNTER — Ambulatory Visit
Admission: RE | Admit: 2022-05-23 | Discharge: 2022-05-23 | Disposition: A | Discharge status: Home / Self Care | Payer: Medicare HMO | Source: Ambulatory Visit | Attending: Radiation Oncology | Admitting: Radiation Oncology

## 2022-05-23 ENCOUNTER — Encounter: Payer: Self-pay | Admitting: Radiation Oncology

## 2022-05-23 VITALS — BP 152/82 | HR 105 | Temp 97.9°F | Resp 22 | Ht 67.0 in | Wt 146.8 lb

## 2022-05-23 DIAGNOSIS — Z923 Personal history of irradiation: Secondary | ICD-10-CM | POA: Insufficient documentation

## 2022-05-23 DIAGNOSIS — C679 Malignant neoplasm of bladder, unspecified: Secondary | ICD-10-CM | POA: Insufficient documentation

## 2022-05-23 DIAGNOSIS — Z8551 Personal history of malignant neoplasm of bladder: Secondary | ICD-10-CM | POA: Diagnosis present

## 2022-05-23 DIAGNOSIS — Z9221 Personal history of antineoplastic chemotherapy: Secondary | ICD-10-CM | POA: Insufficient documentation

## 2022-05-23 DIAGNOSIS — Z95828 Presence of other vascular implants and grafts: Secondary | ICD-10-CM

## 2022-05-23 MED ORDER — HEPARIN SOD (PORK) LOCK FLUSH 100 UNIT/ML IV SOLN
500.0000 [IU] | Freq: Once | INTRAVENOUS | Status: AC
  Administered 2022-05-23: 500 [IU] via INTRAVENOUS
  Filled 2022-05-23: qty 5

## 2022-05-23 MED ORDER — SODIUM CHLORIDE 0.9% FLUSH
10.0000 mL | Freq: Once | INTRAVENOUS | Status: AC
  Administered 2022-05-23: 10 mL via INTRAVENOUS
  Filled 2022-05-23: qty 10

## 2022-05-23 NOTE — Progress Notes (Signed)
Radiation Oncology Follow up Note  Name: Joe Torres   Date:   05/23/2022 MRN:  [id number] DOB: 02-29-1946    This 77 y.o. 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  REFERRING PROVIDER: [facility], [facility] [employer]  HPI: Patient is a 77 year old Spanish-speaking male accompanied by interpreter now seen out 2 years having completed concurrent chemoradiation therapy for muscle invading transitional cell carcinoma.  Seen today in routine follow-up he is doing well specifically denies any significant increased lower urinary tract symptoms diarrhea or fatigue.Marland Kitchen  He underwent surveillance cystoscopy back in November showing no evidence of disease.  CT scan of chest abdomen pelvis last month showed again no new or progressive findings with generalized thickening of the urinary bladder without focal characteristics.  No evidence of metastatic disease.  COMPLICATIONS OF TREATMENT: none  FOLLOW UP COMPLIANCE: keeps appointments   PHYSICAL EXAM:  BP (!) 152/82   Pulse (!) 105   Temp 97.9 F (36.6 C)   Resp (!) 22   Ht '5\' 7"'$  (1.702 m)   Wt 146 lb 12.8 oz (66.6 kg)   SpO2 95%   BMI 22.99 kg/m  Well-developed well-nourished patient in NAD. HEENT reveals PERLA, EOMI, discs not visualized.  Oral cavity is clear. No oral mucosal lesions are identified. Neck is clear without evidence of cervical or supraclavicular adenopathy. Lungs are clear to A&P. Cardiac examination is essentially unremarkable with regular rate and rhythm without murmur rub or thrill. Abdomen is benign with no organomegaly or masses noted. Motor sensory and DTR levels are equal and symmetric in the upper and lower extremities. Cranial nerves II through XII are grossly intact. Proprioception is intact. No peripheral adenopathy or edema is identified. No motor or sensory levels are noted. Crude visual fields are within  normal range.  RADIOLOGY RESULTS: CT scans reviewed compatible with above-stated findings  PLAN: Present time patient is doing well now 2 years from concurrent chemoradiation for muscle invading bladder cancer.  Pleased with his overall progress.  He has no evidence of disease disease by both CT scans as well as cystoscopy.  I am going to turn follow-up care over to medical oncology as well as urology.  Be happy to reevaluate the patient anytime in the future should that be indicated.  I would like to take this opportunity to thank you for allowing me to participate in the care of your patient.Noreene Filbert, MD

## 2022-08-12 IMAGING — CT CT CHEST-ABD-PELV W/O CM
2 of 4 series · 13 of 36 positions shown, 15 images · non-contrast
Comparison: Comparison is made with November 10, 2018 tube.

CLINICAL DATA: 75-year-old male with history of urothelial
neoplasm, bladder cancer.

EXAM:
CT CHEST, ABDOMEN AND PELVIS WITHOUT CONTRAST
TECHNIQUE: Multidetector CT imaging of the chest, abdomen and pelvis was
performed following the standard protocol without IV contrast.

[Series 2: cap wo st · axial · 0.84mm/px · z∈[-820,-290]mm · 10 of 130 slices shown, 12 images]
[im 12/130  mediastinal]
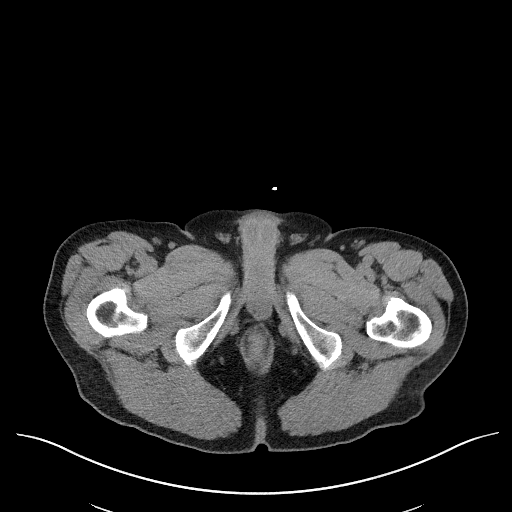
[im 12/130  bone]
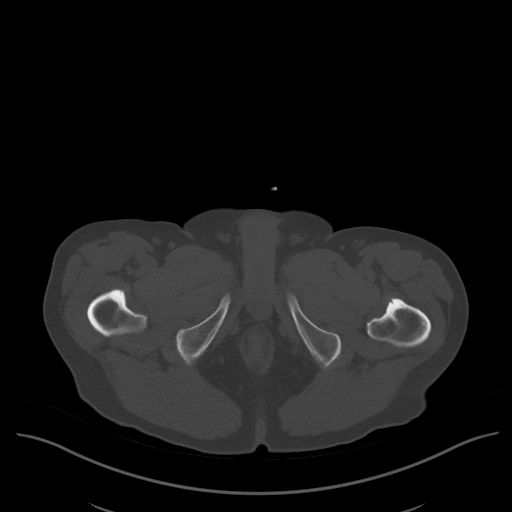
[im 24/130  mediastinal]
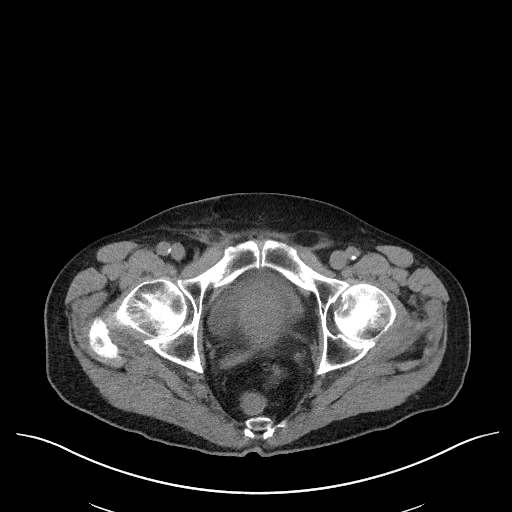
[im 36/130  mediastinal]
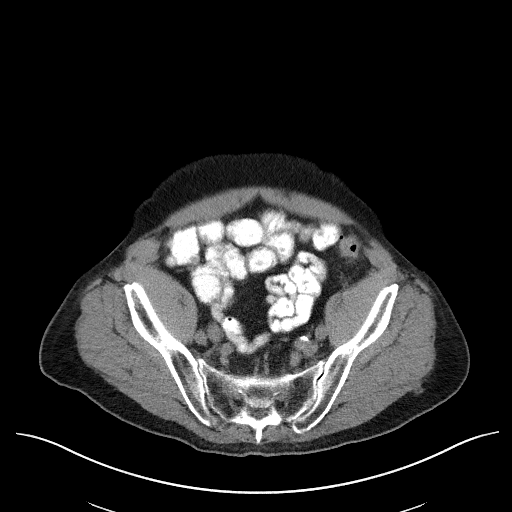
[im 47/130  mediastinal]
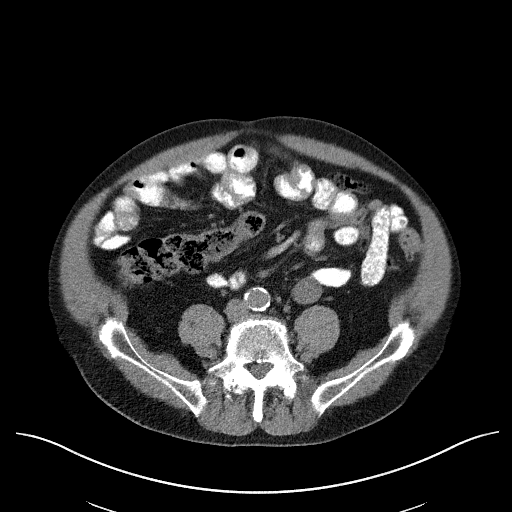
[im 59/130  mediastinal]
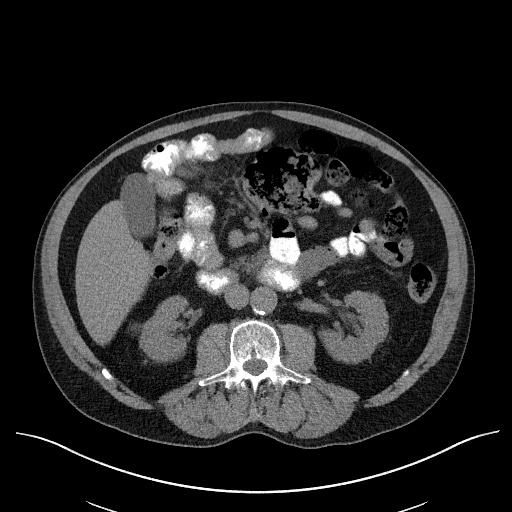
[im 71/130  mediastinal]
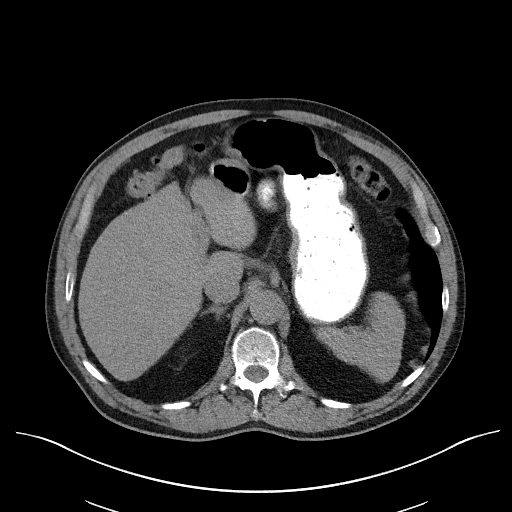
[im 83/130  mediastinal]
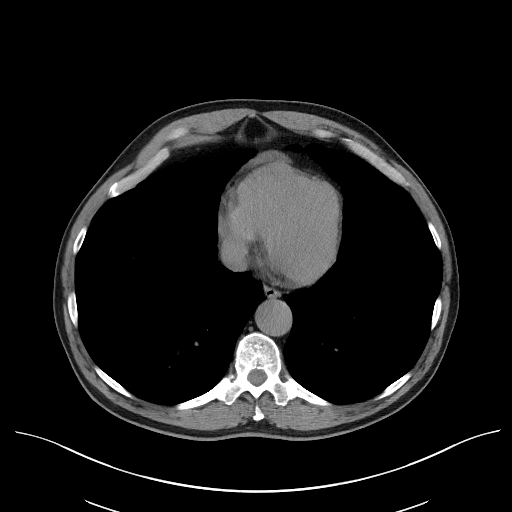
[im 94/130  mediastinal]
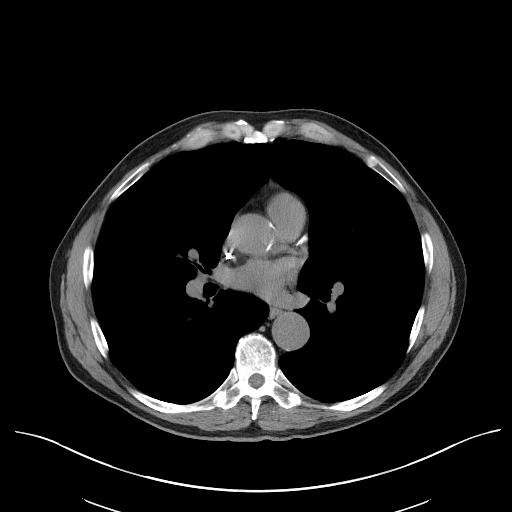
[im 106/130  mediastinal]
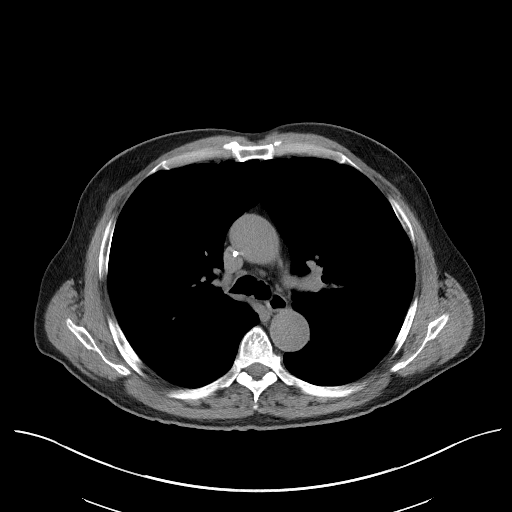
[im 106/130  bone]
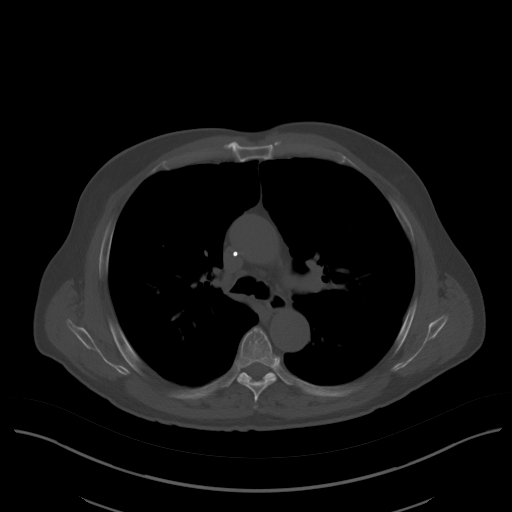
[im 118/130  mediastinal]
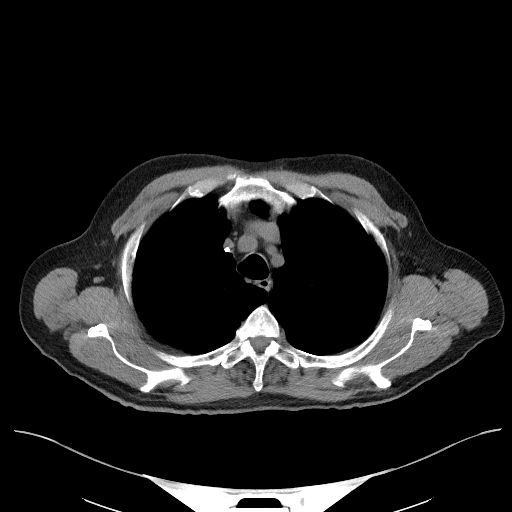

[Series 5: coronal · coronal · 0.88mm/px · 3 of 157 slices shown]
[im 32/157  mediastinal]
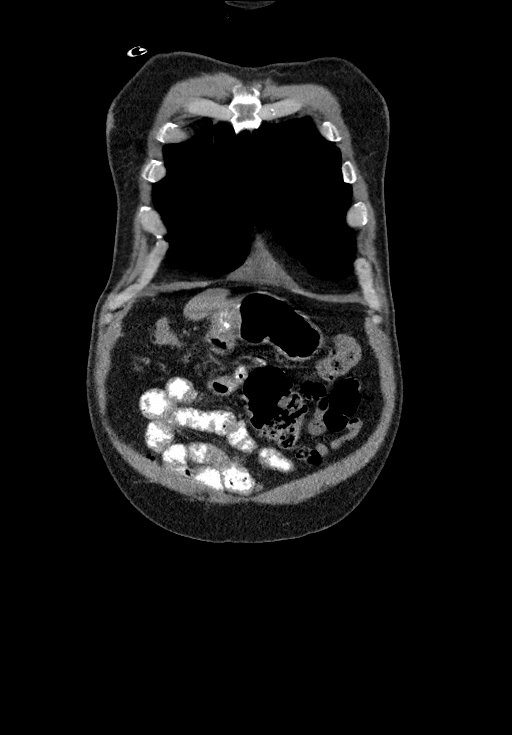
[im 63/157  mediastinal]
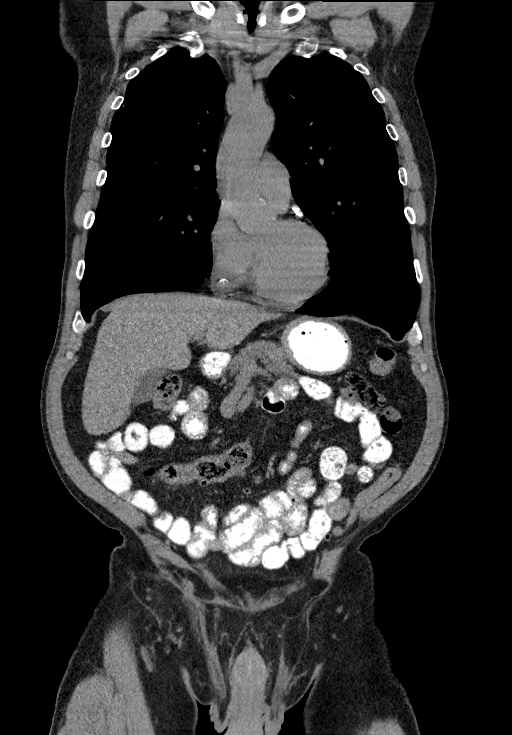
[im 94/157  mediastinal]
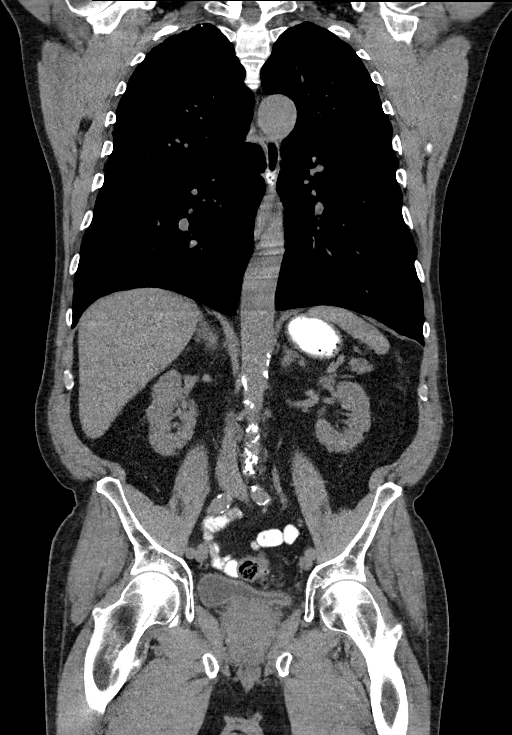

[13 of 36 positions shown; findings below may reference images not displayed]

FINDINGS: CT CHEST FINDINGS

Cardiovascular: RIGHT-sided Port-A-Cath terminates at the caval to
atrial junction. Heart size is normal with three-vessel coronary
artery disease. No substantial pericardial effusion. Small
pericardial effusion present today is of similar volume to what was
present previously. Calcified atheromatous plaque in the thoracic
aorta. Normal caliber of the central pulmonary vessels. Limited
assessment of cardiovascular structures given lack of intravenous
contrast.

Mediastinum/Nodes: No thoracic inlet, axillary, mediastinal or hilar
adenopathy. Esophagus grossly normal.

Lungs/Pleura: Signs of pulmonary emphysema. Mild pleural and
parenchymal scarring. Small RIGHT upper lobe nodular area measuring
5 mm with some calcification more likely related to scarring and
unchanged. Similar areas along the pleural surface also in the LEFT
upper lobe no effusion. No consolidative process.

Musculoskeletal: See below for full musculoskeletal details.

CT ABDOMEN PELVIS FINDINGS

Hepatobiliary: Normal hepatic contour, no hepatic enlargement, gross
biliary duct distension or signs of pericholecystic fluid. No
visible lesion on noncontrast imaging.

Pancreas: Pancreas with normal contour without signs of
inflammation.

Spleen: Normal in size and contour.

Adrenals/Urinary Tract: Adrenal glands are normal.

Mild cortical scarring of the bilateral kidneys. No hydronephrosis.
Stable RIGHT-sided renal cyst. Urinary bladder is collapsed. No
gross perivesical stranding.

Stomach/Bowel: Diverticular changes of the sigmoid colon. Midline
and upper abdominal position of the cecum. Normal appendix. Cecal
position is unchanged since [REDACTED] and not associated with obstruction
or adjacent stranding.

Vascular/Lymphatic:

Aortic atherosclerosis. No sign of aneurysm. Smooth contour of the
IVC. There is no gastrohepatic or hepatoduodenal ligament
lymphadenopathy. No retroperitoneal or mesenteric lymphadenopathy.

No pelvic sidewall lymphadenopathy.

Atherosclerotic changes are moderate and assessment of vascular
structures is limited by lack of intravenous contrast.

Reproductive: Unremarkable.

Other: No ascites.

Musculoskeletal: No acute bone finding. No destructive bone process.
Spinal degenerative changes.
IMPRESSION: No evidence of metastatic disease in the chest, abdomen or pelvis.

Resolution of nodularity seen at the LEFT lung base on the previous
study. This was reported on the RIGHT but appears to been present on
the LEFT and is no longer visible.

Signs of pulmonary emphysema.

Diverticular changes of the sigmoid colon without evidence of acute
diverticulitis.

Aortic atherosclerosis.

Aortic Atherosclerosis (O64E3-6DT.T) and Emphysema (O64E3-VD6.O).

## 2022-08-22 ENCOUNTER — Other Ambulatory Visit: Payer: Self-pay | Admitting: Oncology

## 2022-08-22 NOTE — Telephone Encounter (Signed)
Component Ref Range & Units 3 mo ago (04/29/22) 4 mo ago (04/24/22) 10 mo ago (10/23/21) 1 yr ago (05/23/21) 1 yr ago (04/24/21) 1 yr ago (01/23/21) 1 yr ago (10/10/20)  Potassium 3.5 - 5.1 mmol/L 3.4 Low    3.1 Low  CM 3.0 Low  3.2 Low  3.1 Low

## 2022-10-20 ENCOUNTER — Emergency Department: Payer: Medicare HMO

## 2022-10-20 ENCOUNTER — Inpatient Hospital Stay: Payer: Medicare HMO | Admitting: General Practice

## 2022-10-20 ENCOUNTER — Other Ambulatory Visit: Payer: Self-pay

## 2022-10-20 ENCOUNTER — Encounter: Payer: Self-pay | Admitting: General Surgery

## 2022-10-20 ENCOUNTER — Encounter
Admission: EM | Disposition: A | Discharge status: Home Health | Payer: Self-pay | Source: Home / Self Care | Attending: Internal Medicine

## 2022-10-20 ENCOUNTER — Inpatient Hospital Stay
Admission: EM | Admit: 2022-10-20 | Discharge: 2022-11-07 | DRG: 329 | Disposition: A | Discharge status: Home Health | Payer: Medicare HMO | Attending: Internal Medicine | Admitting: Internal Medicine

## 2022-10-20 DIAGNOSIS — R000 Tachycardia, unspecified: Secondary | ICD-10-CM | POA: Diagnosis not present

## 2022-10-20 DIAGNOSIS — E785 Hyperlipidemia, unspecified: Secondary | ICD-10-CM | POA: Diagnosis present

## 2022-10-20 DIAGNOSIS — Z432 Encounter for attention to ileostomy: Secondary | ICD-10-CM | POA: Diagnosis not present

## 2022-10-20 DIAGNOSIS — Z9981 Dependence on supplemental oxygen: Secondary | ICD-10-CM

## 2022-10-20 DIAGNOSIS — Z8551 Personal history of malignant neoplasm of bladder: Secondary | ICD-10-CM | POA: Diagnosis not present

## 2022-10-20 DIAGNOSIS — J441 Chronic obstructive pulmonary disease with (acute) exacerbation: Secondary | ICD-10-CM | POA: Diagnosis present

## 2022-10-20 DIAGNOSIS — K650 Generalized (acute) peritonitis: Secondary | ICD-10-CM | POA: Diagnosis present

## 2022-10-20 DIAGNOSIS — E876 Hypokalemia: Secondary | ICD-10-CM | POA: Diagnosis present

## 2022-10-20 DIAGNOSIS — I700 Atherosclerosis of aorta: Secondary | ICD-10-CM | POA: Diagnosis present

## 2022-10-20 DIAGNOSIS — G4733 Obstructive sleep apnea (adult) (pediatric): Secondary | ICD-10-CM | POA: Diagnosis present

## 2022-10-20 DIAGNOSIS — J9601 Acute respiratory failure with hypoxia: Secondary | ICD-10-CM

## 2022-10-20 DIAGNOSIS — K5720 Diverticulitis of large intestine with perforation and abscess without bleeding: Principal | ICD-10-CM | POA: Diagnosis present

## 2022-10-20 DIAGNOSIS — J9621 Acute and chronic respiratory failure with hypoxia: Secondary | ICD-10-CM

## 2022-10-20 DIAGNOSIS — Z7952 Long term (current) use of systemic steroids: Secondary | ICD-10-CM

## 2022-10-20 DIAGNOSIS — K560 Paralytic ileus: Secondary | ICD-10-CM | POA: Diagnosis not present

## 2022-10-20 DIAGNOSIS — K567 Ileus, unspecified: Secondary | ICD-10-CM | POA: Diagnosis not present

## 2022-10-20 DIAGNOSIS — J4489 Other specified chronic obstructive pulmonary disease: Secondary | ICD-10-CM | POA: Diagnosis not present

## 2022-10-20 DIAGNOSIS — J432 Centrilobular emphysema: Secondary | ICD-10-CM | POA: Diagnosis present

## 2022-10-20 DIAGNOSIS — Z87891 Personal history of nicotine dependence: Secondary | ICD-10-CM | POA: Diagnosis not present

## 2022-10-20 DIAGNOSIS — R300 Dysuria: Secondary | ICD-10-CM | POA: Diagnosis present

## 2022-10-20 DIAGNOSIS — R198 Other specified symptoms and signs involving the digestive system and abdomen: Principal | ICD-10-CM

## 2022-10-20 DIAGNOSIS — C678 Malignant neoplasm of overlapping sites of bladder: Secondary | ICD-10-CM | POA: Diagnosis present

## 2022-10-20 DIAGNOSIS — Z79899 Other long term (current) drug therapy: Secondary | ICD-10-CM

## 2022-10-20 DIAGNOSIS — J45901 Unspecified asthma with (acute) exacerbation: Secondary | ICD-10-CM | POA: Diagnosis present

## 2022-10-20 DIAGNOSIS — K219 Gastro-esophageal reflux disease without esophagitis: Secondary | ICD-10-CM | POA: Diagnosis present

## 2022-10-20 DIAGNOSIS — Z923 Personal history of irradiation: Secondary | ICD-10-CM | POA: Diagnosis not present

## 2022-10-20 DIAGNOSIS — Z9221 Personal history of antineoplastic chemotherapy: Secondary | ICD-10-CM | POA: Diagnosis not present

## 2022-10-20 DIAGNOSIS — Z825 Family history of asthma and other chronic lower respiratory diseases: Secondary | ICD-10-CM

## 2022-10-20 DIAGNOSIS — I10 Essential (primary) hypertension: Secondary | ICD-10-CM | POA: Diagnosis present

## 2022-10-20 DIAGNOSIS — N401 Enlarged prostate with lower urinary tract symptoms: Secondary | ICD-10-CM | POA: Diagnosis present

## 2022-10-20 DIAGNOSIS — Z91013 Allergy to seafood: Secondary | ICD-10-CM

## 2022-10-20 DIAGNOSIS — N400 Enlarged prostate without lower urinary tract symptoms: Secondary | ICD-10-CM | POA: Diagnosis present

## 2022-10-20 DIAGNOSIS — K9189 Other postprocedural complications and disorders of digestive system: Secondary | ICD-10-CM | POA: Diagnosis not present

## 2022-10-20 DIAGNOSIS — Z7951 Long term (current) use of inhaled steroids: Secondary | ICD-10-CM

## 2022-10-20 DIAGNOSIS — R3912 Poor urinary stream: Secondary | ICD-10-CM | POA: Diagnosis present

## 2022-10-20 HISTORY — PX: LAPAROTOMY: SHX154

## 2022-10-20 LAB — HEPATIC FUNCTION PANEL
ALT: 29 U/L (ref 0–44)
AST: 23 U/L (ref 15–41)
Albumin: 4.1 g/dL (ref 3.5–5.0)
Alkaline Phosphatase: 59 U/L (ref 38–126)
Bilirubin, Direct: 0.1 mg/dL (ref 0.0–0.2)
Indirect Bilirubin: 0.9 mg/dL (ref 0.3–0.9)
Total Bilirubin: 1 mg/dL (ref 0.3–1.2)
Total Protein: 7 g/dL (ref 6.5–8.1)

## 2022-10-20 LAB — CBC
HCT: 44 % (ref 39.0–52.0)
Hemoglobin: 14.3 g/dL (ref 13.0–17.0)
MCH: 29.5 pg (ref 26.0–34.0)
MCHC: 32.5 g/dL (ref 30.0–36.0)
MCV: 90.9 fL (ref 80.0–100.0)
Platelets: 348 10*3/uL (ref 150–400)
RBC: 4.84 MIL/uL (ref 4.22–5.81)
RDW: 13.2 % (ref 11.5–15.5)
WBC: 12.4 10*3/uL — ABNORMAL HIGH (ref 4.0–10.5)
nRBC: 0 % (ref 0.0–0.2)

## 2022-10-20 LAB — BASIC METABOLIC PANEL
Anion gap: 12 (ref 5–15)
BUN: 19 mg/dL (ref 8–23)
CO2: 29 mmol/L (ref 22–32)
Calcium: 9 mg/dL (ref 8.9–10.3)
Chloride: 96 mmol/L — ABNORMAL LOW (ref 98–111)
Creatinine, Ser: 1.11 mg/dL (ref 0.61–1.24)
GFR, Estimated: >60 mL/min (ref >60)
Glucose, Bld: 105 mg/dL — ABNORMAL HIGH (ref 70–99)
Potassium: 3.9 mmol/L (ref 3.5–5.1)
Sodium: 137 mmol/L (ref 135–145)

## 2022-10-20 LAB — URINALYSIS, ROUTINE W REFLEX MICROSCOPIC
Bilirubin Urine: NEGATIVE
Glucose, UA: NEGATIVE mg/dL
Hgb urine dipstick: NEGATIVE
Ketones, ur: NEGATIVE mg/dL
Leukocytes,Ua: NEGATIVE
Nitrite: NEGATIVE
Protein, ur: NEGATIVE mg/dL
Specific Gravity, Urine: 1.015 (ref 1.005–1.030)
pH: 6 (ref 5.0–8.0)

## 2022-10-20 LAB — LIPASE, BLOOD: Lipase: 31 U/L (ref 11–51)

## 2022-10-20 LAB — TROPONIN I (HIGH SENSITIVITY)
Troponin I (High Sensitivity): 7 ng/L (ref <18)
Troponin I (High Sensitivity): 6 ng/L (ref <18)

## 2022-10-20 SURGERY — LAPAROTOMY, EXPLORATORY
Anesthesia: General | Site: Abdomen

## 2022-10-20 MED ORDER — ALBUTEROL SULFATE HFA 108 (90 BASE) MCG/ACT IN AERS
INHALATION_SPRAY | RESPIRATORY_TRACT | Status: AC
  Filled 2022-10-20: qty 6.7

## 2022-10-20 MED ORDER — HYDROCHLOROTHIAZIDE 25 MG PO TABS
25.0000 mg | ORAL_TABLET | Freq: Every day | ORAL | Status: DC
  Administered 2022-10-20 – 2022-10-25 (×6): 25 mg via ORAL
  Filled 2022-10-20 (×6): qty 1

## 2022-10-20 MED ORDER — PIPERACILLIN-TAZOBACTAM 3.375 G IVPB 30 MIN
3.3750 g | Freq: Once | INTRAVENOUS | Status: AC
  Administered 2022-10-20: 3.375 g via INTRAVENOUS
  Filled 2022-10-20: qty 50

## 2022-10-20 MED ORDER — IRRISEPT - 450ML BOTTLE WITH 0.05% CHG IN STERILE WATER, USP 99.95% OPTIME
TOPICAL | Status: DC | PRN
  Administered 2022-10-20: 450 mL

## 2022-10-20 MED ORDER — IPRATROPIUM-ALBUTEROL 0.5-2.5 (3) MG/3ML IN SOLN
RESPIRATORY_TRACT | Status: AC
  Filled 2022-10-20: qty 3

## 2022-10-20 MED ORDER — OXYCODONE HCL 5 MG PO TABS: 5.0000 mg | ORAL_TABLET | Freq: Once | ORAL | Status: DC | PRN

## 2022-10-20 MED ORDER — ONDANSETRON HCL 4 MG PO TABS: 4.0000 mg | ORAL_TABLET | Freq: Four times a day (QID) | ORAL | Status: DC | PRN

## 2022-10-20 MED ORDER — AMLODIPINE BESYLATE 10 MG PO TABS
10.0000 mg | ORAL_TABLET | Freq: Every day | ORAL | Status: DC
  Administered 2022-10-20 – 2022-10-25 (×6): 10 mg via ORAL
  Filled 2022-10-20: qty 2
  Filled 2022-10-20 (×6): qty 1

## 2022-10-20 MED ORDER — METHYLPREDNISOLONE SODIUM SUCC 40 MG IJ SOLR
40.0000 mg | INTRAMUSCULAR | Status: DC
Start: 2022-10-20 — End: 2022-10-20

## 2022-10-20 MED ORDER — IPRATROPIUM-ALBUTEROL 0.5-2.5 (3) MG/3ML IN SOLN
3.0000 mL | RESPIRATORY_TRACT | Status: DC | PRN
  Administered 2022-10-20 – 2022-10-25 (×2): 3 mL via RESPIRATORY_TRACT
  Filled 2022-10-20 (×4): qty 3

## 2022-10-20 MED ORDER — 0.9 % SODIUM CHLORIDE (POUR BTL) OPTIME
TOPICAL | Status: DC | PRN
  Administered 2022-10-20 (×2): 1000 mL

## 2022-10-20 MED ORDER — ENOXAPARIN SODIUM 40 MG/0.4ML IJ SOSY
40.0000 mg | PREFILLED_SYRINGE | INTRAMUSCULAR | Status: DC
  Administered 2022-10-20 – 2022-11-07 (×17): 40 mg via SUBCUTANEOUS
  Filled 2022-10-20 (×17): qty 0.4

## 2022-10-20 MED ORDER — DEXAMETHASONE SODIUM PHOSPHATE 10 MG/ML IJ SOLN
INTRAMUSCULAR | Status: AC
  Filled 2022-10-20: qty 1

## 2022-10-20 MED ORDER — ACETAMINOPHEN 10 MG/ML IV SOLN
INTRAVENOUS | Status: DC | PRN
  Administered 2022-10-20: 1000 mg via INTRAVENOUS

## 2022-10-20 MED ORDER — ALBUTEROL SULFATE (2.5 MG/3ML) 0.083% IN NEBU: 2.5000 mg | INHALATION_SOLUTION | RESPIRATORY_TRACT | Status: DC | PRN

## 2022-10-20 MED ORDER — LOSARTAN POTASSIUM 25 MG PO TABS
25.0000 mg | ORAL_TABLET | Freq: Every day | ORAL | Status: DC
  Administered 2022-10-21 – 2022-10-25 (×5): 25 mg via ORAL
  Filled 2022-10-20 (×6): qty 1

## 2022-10-20 MED ORDER — HYDROCODONE-ACETAMINOPHEN 5-325 MG PO TABS
1.0000 | ORAL_TABLET | ORAL | Status: DC | PRN
  Administered 2022-10-22 (×3): 2 via ORAL
  Filled 2022-10-20 (×3): qty 2

## 2022-10-20 MED ORDER — ALBUTEROL SULFATE (2.5 MG/3ML) 0.083% IN NEBU: 3.0000 mL | INHALATION_SOLUTION | RESPIRATORY_TRACT | Status: DC | PRN

## 2022-10-20 MED ORDER — IPRATROPIUM-ALBUTEROL 0.5-2.5 (3) MG/3ML IN SOLN
3.0000 mL | Freq: Once | RESPIRATORY_TRACT | Status: AC
  Administered 2022-10-20: 3 mL via RESPIRATORY_TRACT
  Filled 2022-10-20: qty 3

## 2022-10-20 MED ORDER — BUPIVACAINE LIPOSOME 1.3 % IJ SUSP
INTRAMUSCULAR | Status: DC | PRN
  Administered 2022-10-20: 50 mL

## 2022-10-20 MED ORDER — ROFLUMILAST 500 MCG PO TABS
500.0000 ug | ORAL_TABLET | Freq: Every day | ORAL | Status: DC
  Administered 2022-10-20 – 2022-11-01 (×10): 500 ug via ORAL
  Filled 2022-10-20 (×13): qty 1

## 2022-10-20 MED ORDER — FENTANYL CITRATE (PF) 250 MCG/5ML IJ SOLN
INTRAMUSCULAR | Status: AC
  Filled 2022-10-20: qty 5

## 2022-10-20 MED ORDER — SODIUM CHLORIDE 0.9 % IV SOLN: INTRAVENOUS | Status: DC

## 2022-10-20 MED ORDER — PREDNISONE 20 MG PO TABS: 40.0000 mg | ORAL_TABLET | Freq: Every day | ORAL | Status: DC

## 2022-10-20 MED ORDER — PREDNISONE 20 MG PO TABS
40.0000 mg | ORAL_TABLET | Freq: Every day | ORAL | Status: AC
  Administered 2022-10-21 – 2022-10-24 (×4): 40 mg via ORAL
  Filled 2022-10-20 (×4): qty 2

## 2022-10-20 MED ORDER — TIOTROPIUM BROMIDE MONOHYDRATE 18 MCG IN CAPS
18.0000 ug | ORAL_CAPSULE | Freq: Every day | RESPIRATORY_TRACT | Status: DC
  Filled 2022-10-20: qty 5

## 2022-10-20 MED ORDER — IOHEXOL 350 MG/ML SOLN
75.0000 mL | Freq: Once | INTRAVENOUS | Status: AC | PRN
  Administered 2022-10-20: 75 mL via INTRAVENOUS

## 2022-10-20 MED ORDER — TAMSULOSIN HCL 0.4 MG PO CAPS
0.4000 mg | ORAL_CAPSULE | Freq: Every day | ORAL | Status: DC
  Administered 2022-10-20 – 2022-11-07 (×15): 0.4 mg via ORAL
  Filled 2022-10-20 (×18): qty 1

## 2022-10-20 MED ORDER — IPRATROPIUM-ALBUTEROL 0.5-2.5 (3) MG/3ML IN SOLN
3.0000 mL | RESPIRATORY_TRACT | Status: DC
  Administered 2022-10-20 – 2022-10-23 (×16): 3 mL via RESPIRATORY_TRACT
  Filled 2022-10-20 (×15): qty 3

## 2022-10-20 MED ORDER — MONTELUKAST SODIUM 10 MG PO TABS
10.0000 mg | ORAL_TABLET | Freq: Every day | ORAL | Status: DC
  Administered 2022-10-20 – 2022-10-25 (×6): 10 mg via ORAL
  Filled 2022-10-20 (×6): qty 1

## 2022-10-20 MED ORDER — ACETAMINOPHEN 650 MG RE SUPP: 650.0000 mg | Freq: Four times a day (QID) | RECTAL | Status: DC | PRN

## 2022-10-20 MED ORDER — LIDOCAINE HCL (CARDIAC) PF 100 MG/5ML IV SOSY
PREFILLED_SYRINGE | INTRAVENOUS | Status: DC | PRN
  Administered 2022-10-20: 100 mg via INTRAVENOUS

## 2022-10-20 MED ORDER — CEFAZOLIN SODIUM-DEXTROSE 2-3 GM-%(50ML) IV SOLR
INTRAVENOUS | Status: DC | PRN
  Administered 2022-10-20: 2 g via INTRAVENOUS

## 2022-10-20 MED ORDER — METHYLPREDNISOLONE SODIUM SUCC 125 MG IJ SOLR
125.0000 mg | Freq: Once | INTRAMUSCULAR | Status: AC
  Administered 2022-10-20: 125 mg via INTRAVENOUS
  Filled 2022-10-20: qty 2

## 2022-10-20 MED ORDER — ONDANSETRON 4 MG PO TBDP: 4.0000 mg | ORAL_TABLET | Freq: Four times a day (QID) | ORAL | Status: DC | PRN

## 2022-10-20 MED ORDER — ONDANSETRON HCL 4 MG/2ML IJ SOLN
INTRAMUSCULAR | Status: DC | PRN
  Administered 2022-10-20: 4 mg via INTRAVENOUS

## 2022-10-20 MED ORDER — KETAMINE HCL 50 MG/5ML IJ SOSY
PREFILLED_SYRINGE | INTRAMUSCULAR | Status: AC
  Filled 2022-10-20: qty 5

## 2022-10-20 MED ORDER — ACETAMINOPHEN 10 MG/ML IV SOLN
INTRAVENOUS | Status: AC
  Filled 2022-10-20: qty 100

## 2022-10-20 MED ORDER — ONDANSETRON HCL 4 MG/2ML IJ SOLN
4.0000 mg | Freq: Four times a day (QID) | INTRAMUSCULAR | Status: DC | PRN
Start: 2022-10-20 — End: 2022-10-20

## 2022-10-20 MED ORDER — ACETAMINOPHEN 325 MG PO TABS: 650.0000 mg | ORAL_TABLET | Freq: Four times a day (QID) | ORAL | Status: DC | PRN

## 2022-10-20 MED ORDER — PHENYLEPHRINE HCL (PRESSORS) 10 MG/ML IV SOLN
INTRAVENOUS | Status: DC | PRN
  Administered 2022-10-20 (×3): 80 ug via INTRAVENOUS

## 2022-10-20 MED ORDER — MORPHINE SULFATE (PF) 4 MG/ML IV SOLN
4.0000 mg | INTRAVENOUS | Status: DC | PRN
  Administered 2022-10-20 – 2022-10-23 (×10): 4 mg via INTRAVENOUS
  Filled 2022-10-20 (×10): qty 1

## 2022-10-20 MED ORDER — FENTANYL CITRATE (PF) 100 MCG/2ML IJ SOLN
INTRAMUSCULAR | Status: AC
  Filled 2022-10-20: qty 2

## 2022-10-20 MED ORDER — PANTOPRAZOLE SODIUM 40 MG IV SOLR
40.0000 mg | Freq: Every day | INTRAVENOUS | Status: DC
  Administered 2022-10-20 – 2022-10-21 (×2): 40 mg via INTRAVENOUS
  Filled 2022-10-20 (×2): qty 10

## 2022-10-20 MED ORDER — PROPOFOL 10 MG/ML IV BOLUS
INTRAVENOUS | Status: DC | PRN
  Administered 2022-10-20: 150 mg via INTRAVENOUS

## 2022-10-20 MED ORDER — OXYCODONE HCL 5 MG/5ML PO SOLN: 5.0000 mg | Freq: Once | ORAL | Status: DC | PRN

## 2022-10-20 MED ORDER — ARFORMOTEROL TARTRATE 15 MCG/2ML IN NEBU
15.0000 ug | INHALATION_SOLUTION | Freq: Two times a day (BID) | RESPIRATORY_TRACT | Status: DC
  Administered 2022-10-20 – 2022-11-07 (×35): 15 ug via RESPIRATORY_TRACT
  Filled 2022-10-20 (×38): qty 2

## 2022-10-20 MED ORDER — PHENYLEPHRINE 80 MCG/ML (10ML) SYRINGE FOR IV PUSH (FOR BLOOD PRESSURE SUPPORT)
PREFILLED_SYRINGE | INTRAVENOUS | Status: AC
  Filled 2022-10-20: qty 10

## 2022-10-20 MED ORDER — KETAMINE HCL 10 MG/ML IJ SOLN
INTRAMUSCULAR | Status: DC | PRN
  Administered 2022-10-20: 20 mg via INTRAVENOUS

## 2022-10-20 MED ORDER — DEXAMETHASONE SODIUM PHOSPHATE 10 MG/ML IJ SOLN
INTRAMUSCULAR | Status: DC | PRN
  Administered 2022-10-20: 5 mg via INTRAVENOUS

## 2022-10-20 MED ORDER — FENTANYL CITRATE (PF) 100 MCG/2ML IJ SOLN
INTRAMUSCULAR | Status: DC | PRN
  Administered 2022-10-20: 100 ug via INTRAVENOUS
  Administered 2022-10-20 (×3): 50 ug via INTRAVENOUS

## 2022-10-20 MED ORDER — ROCURONIUM BROMIDE 100 MG/10ML IV SOLN
INTRAVENOUS | Status: DC | PRN
  Administered 2022-10-20: 10 mg via INTRAVENOUS
  Administered 2022-10-20: 50 mg via INTRAVENOUS
  Administered 2022-10-20: 20 mg via INTRAVENOUS
  Administered 2022-10-20: 10 mg via INTRAVENOUS

## 2022-10-20 MED ORDER — SUGAMMADEX SODIUM 200 MG/2ML IV SOLN
INTRAVENOUS | Status: DC | PRN
  Administered 2022-10-20: 200 mg via INTRAVENOUS

## 2022-10-20 MED ORDER — PIPERACILLIN-TAZOBACTAM 3.375 G IVPB
3.3750 g | Freq: Three times a day (TID) | INTRAVENOUS | Status: AC
  Administered 2022-10-20 – 2022-10-27 (×22): 3.375 g via INTRAVENOUS
  Filled 2022-10-20 (×22): qty 50

## 2022-10-20 MED ORDER — ONDANSETRON HCL 4 MG/2ML IJ SOLN
4.0000 mg | Freq: Once | INTRAMUSCULAR | Status: AC
  Administered 2022-10-20: 4 mg via INTRAVENOUS
  Filled 2022-10-20: qty 2

## 2022-10-20 MED ORDER — FENTANYL CITRATE (PF) 100 MCG/2ML IJ SOLN
25.0000 ug | INTRAMUSCULAR | Status: DC | PRN
  Administered 2022-10-20: 25 ug via INTRAVENOUS

## 2022-10-20 MED ORDER — ONDANSETRON HCL 4 MG/2ML IJ SOLN
INTRAMUSCULAR | Status: AC
  Filled 2022-10-20: qty 2

## 2022-10-20 MED ORDER — BUDESONIDE 0.5 MG/2ML IN SUSP
0.5000 mg | Freq: Two times a day (BID) | RESPIRATORY_TRACT | Status: DC
  Administered 2022-10-20 – 2022-11-07 (×36): 0.5 mg via RESPIRATORY_TRACT
  Filled 2022-10-20 (×37): qty 2

## 2022-10-20 MED ORDER — MORPHINE SULFATE (PF) 4 MG/ML IV SOLN
4.0000 mg | Freq: Once | INTRAVENOUS | Status: AC
  Administered 2022-10-20: 4 mg via INTRAVENOUS
  Filled 2022-10-20: qty 1

## 2022-10-20 MED ORDER — ONDANSETRON HCL 4 MG/2ML IJ SOLN
4.0000 mg | Freq: Four times a day (QID) | INTRAMUSCULAR | Status: DC | PRN
  Administered 2022-10-20 – 2022-10-29 (×7): 4 mg via INTRAVENOUS
  Filled 2022-10-20 (×8): qty 2

## 2022-10-20 SURGICAL SUPPLY — 59 items
APL PRP STRL LF DISP 70% ISPRP (MISCELLANEOUS) ×1
CHLORAPREP W/TINT 26 (MISCELLANEOUS) ×1 IMPLANT
DRAPE LAPAROTOMY 100X77 ABD (DRAPES) ×1 IMPLANT
DRAPE LEGGINS SURG 28X43 STRL (DRAPES) IMPLANT
DRSG OPSITE POSTOP 4X10 (GAUZE/BANDAGES/DRESSINGS) IMPLANT
DRSG OPSITE POSTOP 4X8 (GAUZE/BANDAGES/DRESSINGS) IMPLANT
ELECT BLADE 6.5 EXT (BLADE) ×1 IMPLANT
ELECT CAUTERY BLADE 6.4 (BLADE) ×1 IMPLANT
ELECT REM PT RETURN 9FT ADLT (ELECTROSURGICAL) ×1
ELECTRODE REM PT RTRN 9FT ADLT (ELECTROSURGICAL) ×1 IMPLANT
GAUZE 4X4 16PLY ~~LOC~~+RFID DBL (SPONGE) ×1 IMPLANT
GLOVE BIO SURGEON STRL SZ 6.5 (GLOVE) ×1 IMPLANT
GLOVE BIOGEL PI IND STRL 6.5 (GLOVE) ×1 IMPLANT
GOWN STRL REUS W/ TWL LRG LVL3 (GOWN DISPOSABLE) ×2 IMPLANT
GOWN STRL REUS W/TWL LRG LVL3 (GOWN DISPOSABLE) ×2
HOLDER FOLEY CATH W/STRAP (MISCELLANEOUS) IMPLANT
JET LAVAGE IRRISEPT WOUND (IRRIGATION / IRRIGATOR) ×1
KIT OSTOMY 2 PC DRNBL 2.25 STR (WOUND CARE) IMPLANT
KIT OSTOMY DRAINABLE 2.25 STR (WOUND CARE) ×1
KIT TURNOVER KIT A (KITS) ×1 IMPLANT
LABEL OR SOLS (LABEL) ×1 IMPLANT
LAVAGE JET IRRISEPT WOUND (IRRIGATION / IRRIGATOR) IMPLANT
LIGASURE IMPACT 36 18CM CVD LR (INSTRUMENTS) IMPLANT
MANIFOLD NEPTUNE II (INSTRUMENTS) ×1 IMPLANT
NDL HYPO 22X1.5 SAFETY MO (MISCELLANEOUS) ×1 IMPLANT
NEEDLE HYPO 22X1.5 SAFETY MO (MISCELLANEOUS) ×2 IMPLANT
NS IRRIG 1000ML POUR BTL (IV SOLUTION) ×1 IMPLANT
PACK BASIN MAJOR ARMC (MISCELLANEOUS) ×1 IMPLANT
PACK COLON CLEAN CLOSURE (MISCELLANEOUS) ×1 IMPLANT
RELOAD BL CONTOUR (ENDOMECHANICALS) ×1 IMPLANT
RELOAD LINEAR CUT PROX 55 BLUE (ENDOMECHANICALS) IMPLANT
RELOAD PROXIMATE 75MM BLUE (ENDOMECHANICALS) IMPLANT
RELOAD STAPLE 40 BLU REG (ENDOMECHANICALS) IMPLANT
RELOAD STAPLE 55 3.8 BLU REG (ENDOMECHANICALS) IMPLANT
RELOAD STAPLE 75 3.8 BLU REG (ENDOMECHANICALS) IMPLANT
RETRACTOR WND ALEXIS-O 25 LRG (MISCELLANEOUS) IMPLANT
RTRCTR WOUND ALEXIS O 25CM LRG (MISCELLANEOUS) ×1
SPONGE T-LAP 18X18 ~~LOC~~+RFID (SPONGE) ×4 IMPLANT
STAPLER CIRCULAR MANUAL XL 29 (STAPLE) IMPLANT
STAPLER CVD CUT BL 40 RELOAD (ENDOMECHANICALS) ×1 IMPLANT
STAPLER CVD CUT BLU 40 RELOAD (ENDOMECHANICALS) IMPLANT
STAPLER GUN LINEAR PROX 60 (STAPLE) IMPLANT
STAPLER PROXIMATE 55 BLUE (STAPLE) IMPLANT
STAPLER PROXIMATE 75MM BLUE (STAPLE) IMPLANT
STAPLER RELOADABLE 65 2-0 SUT (MISCELLANEOUS) IMPLANT
STAPLER SKIN PROX 35W (STAPLE) ×1 IMPLANT
STAPLER SYS INTERNAL RELOAD SS (MISCELLANEOUS) ×1 IMPLANT
SUT SILK 2 0 (SUTURE) ×1
SUT SILK 2-0 18XBRD TIE 12 (SUTURE) ×1 IMPLANT
SUT SILK 3-0 (SUTURE) IMPLANT
SUT STRATAFIX PDS 30 CT-1 (SUTURE) IMPLANT
SUT VIC AB 3-0 SH 27 (SUTURE) ×1
SUT VIC AB 3-0 SH 27X BRD (SUTURE) ×1 IMPLANT
SUT VICRYL 3-0 CR8 SH (SUTURE) IMPLANT
SYR 20ML LL LF (SYRINGE) ×2 IMPLANT
SYR BULB IRRIG 60ML STRL (SYRINGE) IMPLANT
TRAP FLUID SMOKE EVACUATOR (MISCELLANEOUS) ×1 IMPLANT
TRAY FOLEY MTR SLVR 16FR STAT (SET/KITS/TRAYS/PACK) ×1 IMPLANT
WATER STERILE IRR 500ML POUR (IV SOLUTION) ×1 IMPLANT

## 2022-10-20 NOTE — Anesthesia Procedure Notes (Signed)
Procedure Name: Intubation Date/Time: 10/20/2022 2:11 PM  Performed by: Karoline Caldwell, CRNAPre-anesthesia Checklist: Patient identified, Patient being monitored, Timeout performed, Emergency Drugs available and Suction available Patient Re-evaluated:Patient Re-evaluated prior to induction Oxygen Delivery Method: Circle system utilized Preoxygenation: Pre-oxygenation with 100% oxygen Induction Type: IV induction Ventilation: Mask ventilation without difficulty Laryngoscope Size: 4 and McGraph Grade View: Grade I Tube type: Oral Tube size: 7.5 mm Number of attempts: 1 Airway Equipment and Method: Stylet Placement Confirmation: ETT inserted through vocal cords under direct vision, positive ETCO2 and breath sounds checked- equal and bilateral Secured at: 22 cm Tube secured with: Tape Dental Injury: Teeth and Oropharynx as per pre-operative assessment

## 2022-10-20 NOTE — Transfer of Care (Signed)
Immediate Anesthesia Transfer of Care Note  Patient: Joe Torres  Procedure(s) Performed: EXPLORATORY LAPAROTOMY,  SIGMOID COLECTOMY, LOOP ILEOSTOMY (Abdomen)  Patient Location: PACU  Anesthesia Type:General  Level of Consciousness: awake, alert , and oriented  Airway & Oxygen Therapy: Patient Spontanous Breathing  Post-op Assessment: Report given to RN and Post -op Vital signs reviewed and stable  Post vital signs: Reviewed and stable  Last Vitals:  Vitals Value Taken Time  BP 120/72 10/20/22 1645  Temp    Pulse 98 10/20/22 1659  Resp 11 10/20/22 1659  SpO2 100 % 10/20/22 1659  Vitals shown include unvalidated device data.  Last Pain:  Vitals:   10/20/22 1633  PainSc: 8       Patients Stated Pain Goal: 0 (10/20/22 1633)  Complications: No notable events documented.

## 2022-10-20 NOTE — Consult Note (Addendum)
NAME:  Joe Torres, MRN:  [id number], DOB:  Nov 17, 1945, LOS: 0 ADMISSION DATE:  10/20/2022, CONSULTATION DATE:  10/20/2022 REFERRING MD:  Hazle Quant, MD, CHIEF COMPLAINT:  Asthma/COPD Overlap   History of Present Illness:  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 He was found to have perforated diverticulitis and is now s/p resection..  Patient follows  with pulmonology at [facility] for Asthma/COPD overlap. His last visit was on 08/26/2022. His max eosinophil count on chart review was 400. Patient is chronically maintained on steroids, in addition to Dupilumab. His prednisone was decreased from 10 mg to 7.5 mg daily on 4/15. He is also maintained on nebulized arformoterol twice daily, nebulized budesonide twice daily, spiriva handihaler once daily, Montelukast once daily, and Roflumilast once daily.  Patient reports using his albuterol every 2 hours, which is also noted in documentation from his pulmonologist. He is a former smoker, quit in 2010, with a 60 pack year smoking history. Previous respiratory cultures with Moraxella.  I interviewed and examined the patient post operatively in the presence of an in person spanish interpretor. The patient was somnolent post op and history was limited. He feels short of breath, which is his baseline. He confirmed using his albuterol very frequently to help with breathing. I also talked to the patient's family (wife and daughter) who confirmed the history in the medical record. They confirmed compliance with his medications (LAMA/LABA/ICS/steroids) and also confirmed his current dose of prednisone at 7.5 mg daily. They report he is chronically short of breath, and uses albuterol very frequently.  Pertinent  Medical History  -Asthma-COPD Overlap (FEV1 0.78 liters, 43% predicted) -OSA   Objective   Blood pressure (!) 142/80, pulse 96, temperature 100 F (37.8 C),  resp. rate 15, height 5\' 9"  (1.753 m), weight 69.9 kg, SpO2 96 %.        Intake/Output Summary (Last 24 hours) at 10/20/2022 1721 Last data filed at 10/20/2022 1632 Gross per 24 hour  Intake 1000 ml  Output 500 ml  Net 500 ml   Filed Weights   10/20/22 0808  Weight: 69.9 kg    Examination: Physical Exam Constitutional:      General: He is not in acute distress.    Appearance: He is ill-appearing.  HENT:     Mouth/Throat:     Mouth: Mucous membranes are moist.  Cardiovascular:     Rate and Rhythm: Normal rate and regular rhythm.     Pulses: Normal pulses.     Heart sounds: Normal heart sounds.  Pulmonary:     Effort: Pulmonary effort is normal.     Breath sounds: No rales.     Comments: Faint end expiratory wheezing Neurological:     Comments: Patient somnolent after his surgery, but does arouse and answers questions appropriately.      Assessment & Plan:   #Asthma/COPD Overlap #OSA  Patient has a history of COPD/Asthma overlap syndrome, followed at [facility], on 2 liters of nasal cannula and maximal medical therapy with LAMA/LABA/ICS in addition to montelukast, roflumilast, dupilumab, and PRN albuterol that he uses very frequently to help with breathing.   Patient presents to the hospital with perforated diverticulitis now s/p LAW with primary anastomosis and a diverting loop ileostomy. He also reported increased shortness of breath and receiving steroids for presumed Asthma/COPD exacerbation for which he received a dose of solumedrol. He is chronically short  of breath but appears to be at baseline, and physical exam shows faint end expiratory wheezes with no sign of exacerbation at this moment. Would favor not continuing prednisone and switching to hydrocortisone to allow for tissue healing. Would continue his home nebulizer and inhaler regimen (see below). Finally, he has a history of OSA and would recommend resumption of his home CPAP.  -continue oxygen therapy -continue  nebulized aformetorol twice daily -continue nebulized budesonide twice daily -continue daily montelukast -continue roflumilast -continue standing duonebs every 4 hours -consider addition of standing nebulized LAMA (revefenacin) -consider switching to hydrocortisone for stress dosing given chronic steroids (50 mg IV twice daily) -ok to hold dupilumab -resume home CPAP  Joe Chute, MD [facility] 10/20/2022 6:13 PM    Labs   CBC: Recent Labs  Lab 10/20/22 0826  WBC 12.4*  HGB 14.3  HCT 44.0  MCV 90.9  PLT 348    Basic Metabolic Panel: Recent Labs  Lab 10/20/22 0826  NA 137  K 3.9  CL 96*  CO2 29  GLUCOSE 105*  BUN 19  CREATININE 1.11  CALCIUM 9.0   GFR: Estimated Creatinine Clearance: 57.7 mL/min (by C-G formula based on SCr of 1.11 mg/dL). Recent Labs  Lab 10/20/22 0826  WBC 12.4*    Liver Function Tests: Recent Labs  Lab 10/20/22 0835  AST 23  ALT 29  ALKPHOS 59  BILITOT 1.0  PROT 7.0  ALBUMIN 4.1   Recent Labs  Lab 10/20/22 0835  LIPASE 31   No results for input(s): "AMMONIA" in the last 168 hours.  ABG No results found for: "PHART", "PCO2ART", "PO2ART", "HCO3", "TCO2", "ACIDBASEDEF", "O2SAT"   Coagulation Profile: No results for input(s): "INR", "PROTIME" in the last 168 hours.  Cardiac Enzymes: No results for input(s): "CKTOTAL", "CKMB", "CKMBINDEX", "TROPONINI" in the last 168 hours.  HbA1C: No results found for: "HGBA1C"  CBG: No results for input(s): "GLUCAP" in the last 168 hours.  Review of Systems:   Unable to obtain  Past Medical History:  He,  has a past medical history of Arthritis, Asthma, Bladder cancer (HCC), Cancer (HCC), COPD (chronic obstructive pulmonary disease) (HCC), Dyspnea, GERD (gastroesophageal reflux disease), and Hypertension.   Surgical History:   Past Surgical History:  Procedure Laterality Date   COLONOSCOPY     COLONOSCOPY WITH PROPOFOL N/A 01/16/2021   Procedure:  COLONOSCOPY WITH PROPOFOL;  Surgeon: Wyline Mood, MD;  Location: [facility] ENDOSCOPY;  Service: Gastroenterology;  Laterality: N/A;  SPANISH INTERPRETER 9 AM ARRIVAL REQUEST   HERNIA REPAIR  2017   PORTA CATH INSERTION N/A 06/19/2020   Procedure: PORTA CATH INSERTION;  Surgeon: Annice Needy, MD;  Location: [facility] INVASIVE CV LAB;  Service: Cardiovascular;  Laterality: N/A;   TRANSURETHRAL RESECTION OF BLADDER TUMOR WITH GYRUS (TURBT-GYRUS)  07/2020   TRANSURETHRAL RESECTION OF BLADDER TUMOR WITH MITOMYCIN-C N/A 05/22/2020   Procedure: TRANSURETHRAL RESECTION OF BLADDER TUMOR WITH Gemcitabine;  Surgeon: Vanna Scotland, MD;  Location: [facility] ORS;  Service: Urology;  Laterality: N/A;     Social History:   reports that he quit smoking about 14 years ago. His smoking use included cigarettes. He has a 60.00 pack-year smoking history. He has been exposed to tobacco smoke. He has never used smokeless tobacco. He reports that he does not currently use alcohol. He reports that he does not use drugs.   Family History:  His family history includes Asthma in his mother.   Allergies Allergies  Allergen Reactions   Shrimp [Shellfish Allergy]  Other (See Comments)    Per MD comments critical     Home Medications  Prior to Admission medications   Medication Sig Start Date End Date Taking? Authorizing Provider  KLOR-CON M20 20 MEQ tablet TAKE 1 TABLET BY MOUTH TWICE A DAY FOR 14 DAYS, FOLLOWED BY 1 TABLET DAILY 08/22/22  Yes Creig Hines, MD  montelukast (SINGULAIR) 10 MG tablet Take 10 mg by mouth at bedtime.   Yes [provider]  pantoprazole (PROTONIX) 40 MG tablet Take 40 mg by mouth daily.   Yes [provider]  roflumilast (DALIRESP) 500 MCG TABS tablet Take 500 mcg by mouth daily.   Yes [provider]  sucralfate (CARAFATE) 1 g tablet Take by mouth. 07/12/21  Yes [provider]  tamsulosin (FLOMAX) 0.4 MG CAPS capsule TAKE 1 CAPSULE BY MOUTH EVERY DAY 10/23/21  Yes  Vanna Scotland, MD  tiotropium (SPIRIVA) 18 MCG inhalation capsule Place 18 mcg into inhaler and inhale daily.   Yes [provider]  acetaminophen (TYLENOL) 500 MG tablet Take 500 mg by mouth every 8 (eight) hours as needed for moderate pain.    [provider]  albuterol (PROVENTIL HFA;VENTOLIN HFA) 108 (90 Base) MCG/ACT inhaler Inhale 2 puffs into the lungs every 4 (four) hours as needed for wheezing or shortness of breath.    [provider]  albuterol (PROVENTIL) (2.5 MG/3ML) 0.083% nebulizer solution Take 2.5 mg by nebulization every 4 (four) hours as needed for wheezing or shortness of breath.    [provider]  amLODipine (NORVASC) 10 MG tablet Take 10 mg by mouth daily.    [provider]  arformoterol (BROVANA) 15 MCG/2ML NEBU Take 15 mcg by nebulization 2 (two) times daily.    [provider]  budesonide (PULMICORT) 0.5 MG/2ML nebulizer solution Take 0.5 mg by nebulization 2 (two) times daily.    [provider]  cetirizine (ZYRTEC) 10 MG tablet Chew 10 mg by mouth daily.    [provider]  diclofenac Sodium (VOLTAREN) 1 % GEL Apply 1 application topically 2 (two) times daily as needed (pain).    [provider]  Dupilumab 300 MG/2ML SOPN Inject into the skin. 04/11/22   [provider]  famotidine (PEPCID) 40 MG tablet Take 40 mg by mouth at bedtime. Patient not taking: Reported on 10/20/2022    [provider]  fluticasone (FLONASE) 50 MCG/ACT nasal spray Place 1 spray into both nostrils daily.    [provider]  hydrochlorothiazide (HYDRODIURIL) 25 MG tablet Take 25 mg by mouth daily.    [provider]  lidocaine-prilocaine (EMLA) cream Apply to affected area once 04/29/22   Creig Hines, MD  losartan (COZAAR) 25 MG tablet Take 25 mg by mouth daily. 09/12/22   [provider]  predniSONE (DELTASONE) 10 MG tablet Take 10 mg by mouth daily. 09/19/22   [provider]  predniSONE (DELTASONE) 5 MG tablet Take 7.5 mg by mouth daily.    [provider]  simvastatin (ZOCOR) 20 MG tablet Take by mouth. 07/27/10   [provider]  traMADol (ULTRAM) 50 MG tablet Take 50 mg by mouth 2 (two) times daily.    [provider]     I spent 60 minutes caring for this patient today, including preparing to see the patient, obtaining a medical history , reviewing a separately obtained history, performing a medically appropriate examination and/or evaluation, counseling and educating the patient/family/caregiver, referring and communicating with other health  care professionals (not separately reported), and documenting clinical information in the electronic health record   Joe Chute, MD [facility] 10/20/2022 6:15 PM

## 2022-10-20 NOTE — Plan of Care (Signed)
Patient A&Ox4, from home, up with assist in room. Post op day 1. Pain partially relieved with medication and rest. Iliostomy C/D/I. Foley and IV fluid also maintained.

## 2022-10-20 NOTE — Anesthesia Preprocedure Evaluation (Signed)
Anesthesia Evaluation  Patient identified by MRN, date of birth, ID band Patient awake    Reviewed: Allergy & Precautions, NPO status , Patient's Chart, lab work & pertinent test results  History of Anesthesia Complications Negative for: history of anesthetic complications  Airway Mallampati: III  TM Distance: <3 FB Neck ROM: full    Dental  (+) Poor Dentition, Missing, Dental Advidsory Given   Pulmonary shortness of breath and with exertion, asthma , sleep apnea , COPD,  COPD inhaler and oxygen dependent, former smoker    + decreased breath sounds      Cardiovascular hypertension, (-) angina Normal cardiovascular exam     Neuro/Psych  Neuromuscular disease  negative psych ROS   GI/Hepatic Neg liver ROS,GERD  Medicated and Controlled,,  Endo/Other  negative endocrine ROS    Renal/GU Renal disease  negative genitourinary   Musculoskeletal   Abdominal   Peds  Hematology negative hematology ROS (+)   Anesthesia Other Findings Past Medical History: No date: Arthritis No date: Asthma No date: Bladder cancer (HCC) No date: Cancer (HCC) No date: COPD (chronic obstructive pulmonary disease) (HCC) No date: Dyspnea No date: GERD (gastroesophageal reflux disease) No date: Hypertension  Past Surgical History: No date: COLONOSCOPY 2017: HERNIA REPAIR 06/19/2020: PORTA CATH INSERTION; N/A     Comment:  Procedure: PORTA CATH INSERTION;  Surgeon: Annice Needy,              MD;  Location: [facility] INVASIVE CV LAB;  Service:               Cardiovascular;  Laterality: N/A; 05/22/2020: TRANSURETHRAL RESECTION OF BLADDER TUMOR WITH MITOMYCIN-C;  N/A     Comment:  Procedure: TRANSURETHRAL RESECTION OF BLADDER TUMOR WITH              Gemcitabine;  Surgeon: Vanna Scotland, MD;  Location:               [facility] ORS;  Service: Urology;  Laterality: N/A;  BMI    Body Mass Index: 24.12 kg/m      Reproductive/Obstetrics negative OB  ROS                             Anesthesia Physical Anesthesia Plan  ASA: 4 and emergent  Anesthesia Plan: General ETT   Post-op Pain Management:    Induction: Intravenous  PONV Risk Score and Plan: Propofol infusion and TIVA  Airway Management Planned: Oral ETT  Additional Equipment:   Intra-op Plan:   Post-operative Plan: Possible Post-op intubation/ventilation  Informed Consent: I have reviewed the patients History and Physical, chart, labs and discussed the procedure including the risks, benefits and alternatives for the proposed anesthesia with the patient or authorized representative who has indicated his/her understanding and acceptance.     Dental Advisory Given  Plan Discussed with: Anesthesiologist, CRNA and Surgeon  Anesthesia Plan Comments: (Patient consented for risks of anesthesia including but not limited to:  - adverse reactions to medications - damage to eyes, teeth, lips or other oral mucosa - nerve damage due to positioning  - sore throat or hoarseness - Damage to heart, brain, nerves, lungs, other parts of body or loss of life  Patient voiced understanding.)        Anesthesia Quick Evaluation

## 2022-10-20 NOTE — Op Note (Signed)
Preoperative diagnosis: Diverticulitis with sigmoid perforation  Postoperative diagnosis: Same  Procedure: Low anterior resection with primary anastomosis.                      Diverting loop ileostomy creation  Anesthesia: GETA  Surgeon: Dr. Hazle Quant  Assistant: Beather Arbour  Wound Classification: Contaminated  Indications:  Patient is a 77 y.o. male with abdominal pain symptoms was found to have perforated diverticulitis. At ED he was found with acute abdomen. Partial resection of sigmoid colon indicated for treatment of perforated viscus.   Findings:  1. Large perforated diverticulum at the distal sigmoid.  2. Both ureter identified and preserved 3. Tension free anastomosis 4. Adequate blood supply to anastomosis appreciated 5. EEA 29 mm used for anastomosis 6. Adequate Hemostasis  Description of procedure: The patient was placed in the lithotomy position and general endotracheal anesthesia was induced. Preoperative antibiotics were given. A Foley catheter and orogastric tube were placed. The abdomen was prepped and draped in the usual sterile fashion. A time-out was completed verifying correct patient, procedure, site, positioning, and implant(s) and/or special equipment prior to beginning this procedure. A lower midline incision was made from just above the umbilicus to the pubic symphysis. This was deepened through the subcutaneous tissues and hemostasis was achieved with electrocautery. The linea alba was identified and incised and the peritoneal cavity entered sharply. The abdomen was explored. Upon entering the abdominal cavity abundant amount of purulent peritonitis was identified.   The small bowel was inspected and retracted to the right using a moist towel and self-retaining retractor. Using electrocautery, the colon was freed from its peritoneal attachments along the line of Toldt proximally from the splenic flexure and distally to the pelvic inlet. Left ureter was  identified and protected.  The mesentery was scored and the inferior mesenteric artery identified and ligated at its origin with using a ligasure device. A proximal point of division was selected, and the colon was divided using a Contour cutting stapler.  The mesorectum was then mobilized posteriorly using sharp dissection. Care was taken not to enter the presacral venous plexus. The lateral rectal stalks were divided using medial traction and electrocautery. Anteriorly the plane between the bladder and rectum was incised and the mesorectal plane mobilized anteriorly. A Contour stapler was then used to divide the rectum distally. Distal end of the bowel were tagged and the specimen removed and sent to pathology. The proximal colon was carefully inspected, noted to reach easily into the pelvis and to have an excellent blood supply. Mesenteric fat was cleared from the distal centimeter of bowel. The field was isolated with laparotomy pads and the bowel opened and sized. It was determined that a 29 mm circular stapler could be accommodated. A purse-string device was placed, the anvil was inserted, and the purse-string suture was tied. The circular stapler was introduced through the anus and guided under direct vision along the rectal stump. The "spike" was pierced through the midportion of the staple line. The proximal bowel with anvil in place was engaged with the circular stapler and the stapler was then fired. The anastomosis was tested for leaks with irrigation solution and air introduced through the rectum and was found to be patent and intact. Two complete donuts were obtained and submitted to pathology, after being marked as proximal and distal.  The pelvis was then copiously irrigated with saline and then Irrisept. Hemostasis was again checked.   A suitably mobile loop of ileum is selected  and confirmed to reach to the anterior abdominal wall and the site of ostomy marking. A window is made in the  mesentery and a penrose was passed. Kocher clamps were used to maintain alignment of the dermis and fascia. An circle of skin was excised at the site of ostomy marking and the subcutaneous tissues divided vertically. Using hand held retractors, the anterior rectus fascia was exposed and vertically incised for a length of approximately 2-3 cm. Rectus fibers were spread in the direction of their travel and the posterior rectus fascia was incised with care taken to protect the underlying bowel. Ostomy hole admitted two fingers. The loop of bowel was passed through and orientation confirmed. Ostomy was checked to ensure no tension or torsion. Fascia and skin were closed as below and the ostomy was matured using 3-0 vicryl to open and allow drainage from the proximal limb. Ostomy bag was applied.   The fascia was closed with a running suture of PDS 0. The skin was closed with subcuticular sutures of Monocryl 3-0.  The patient tolerated the procedure well was extubated in the operating room and was taken to the postanesthesia care unit in stable condition.   Specimen: Rectosigmoid colon  Complications: None  Estimated Blood Loss: 50 mL

## 2022-10-20 NOTE — Assessment & Plan Note (Signed)
PPI ?

## 2022-10-20 NOTE — Assessment & Plan Note (Signed)
Acute decompensated respiratory failure in the setting of COPD exacerbation with cough wheezing, increased sputum reduction Baseline home O2 use around 2-3L Currently on 3 L Positive diffuse wheezing on exam CT with centrilobular emphysema concerning for severe COPD IV Solu-Medrol DuoNebs Continue Zosyn in the setting of diverticulitis coverage Continue the rest of patient's respiratory regimen including Daliresp Titrate back to chronic prednisone at 10 mg daily pending resolution of acute flare

## 2022-10-20 NOTE — Assessment & Plan Note (Signed)
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

## 2022-10-20 NOTE — Assessment & Plan Note (Signed)
Continue statin. 

## 2022-10-20 NOTE — ED Triage Notes (Signed)
Pt with family. Pt coming in with pain with urination and increased shortness of breath. As per family, pain started this morning. Pt has history of bladder cancer. Pt also reporting nausea  Pt and family declined interpreter

## 2022-10-20 NOTE — H&P (Signed)
SURGICAL HISTORY AND PHYSICAL NOTE   HISTORY OF PRESENT ILLNESS (HPI):  77 y.o. male presented to [facility] ED for evaluation of abdominal pain. Patient reports started having lower abdominal pain 2 weeks ago.  He endorses that last night the pain got very severe and he was unable to sleep.  He has been complaining of dysuria as well.  Pain localized to the lower abdomen.  No irritation.  Pain aggravated by applying pressure or abdominal wall movement.  No alleviating factors.  At the ED he was found with leukocytosis.  There was tenderness on palpation in the suprapubic area.  CT scan of the abdomen and pelvis shows free air with inflammation of the distal sigmoid colon.  I personally evaluated the images.  There was concerning for perforated diverticulitis.  Surgery is consulted by Dr. Larinda Buttery in this context for evaluation and management of recurrent diverticulitis with perforation.  PAST MEDICAL HISTORY (PMH):  Past Medical History:  Diagnosis Date   Arthritis    Asthma    Bladder cancer (HCC)    Cancer (HCC)    COPD (chronic obstructive pulmonary disease) (HCC)    Dyspnea    GERD (gastroesophageal reflux disease)    Hypertension      PAST SURGICAL HISTORY (PSH):  Past Surgical History:  Procedure Laterality Date   COLONOSCOPY     COLONOSCOPY WITH PROPOFOL N/A 01/16/2021   Procedure: COLONOSCOPY WITH PROPOFOL;  Surgeon: Wyline Mood, MD;  Location: [facility] ENDOSCOPY;  Service: Gastroenterology;  Laterality: N/A;  SPANISH INTERPRETER 9 AM ARRIVAL REQUEST   HERNIA REPAIR  2017   PORTA CATH INSERTION N/A 06/19/2020   Procedure: PORTA CATH INSERTION;  Surgeon: Annice Needy, MD;  Location: [facility] INVASIVE CV LAB;  Service: Cardiovascular;  Laterality: N/A;   TRANSURETHRAL RESECTION OF BLADDER TUMOR WITH GYRUS (TURBT-GYRUS)  07/2020   TRANSURETHRAL RESECTION OF BLADDER TUMOR WITH MITOMYCIN-C N/A 05/22/2020   Procedure: TRANSURETHRAL RESECTION OF BLADDER TUMOR WITH Gemcitabine;  Surgeon:  Vanna Scotland, MD;  Location: [facility] ORS;  Service: Urology;  Laterality: N/A;     MEDICATIONS:  Prior to Admission medications   Medication Sig Start Date End Date Taking? Authorizing Provider  KLOR-CON M20 20 MEQ tablet TAKE 1 TABLET BY MOUTH TWICE A DAY FOR 14 DAYS, FOLLOWED BY 1 TABLET DAILY 08/22/22  Yes Creig Hines, MD  montelukast (SINGULAIR) 10 MG tablet Take 10 mg by mouth at bedtime.   Yes [provider]  pantoprazole (PROTONIX) 40 MG tablet Take 40 mg by mouth daily.   Yes [provider]  roflumilast (DALIRESP) 500 MCG TABS tablet Take 500 mcg by mouth daily.   Yes [provider]  sucralfate (CARAFATE) 1 g tablet Take by mouth. 07/12/21  Yes [provider]  tamsulosin (FLOMAX) 0.4 MG CAPS capsule TAKE 1 CAPSULE BY MOUTH EVERY DAY 10/23/21  Yes Vanna Scotland, MD  tiotropium (SPIRIVA) 18 MCG inhalation capsule Place 18 mcg into inhaler and inhale daily.   Yes [provider]  acetaminophen (TYLENOL) 500 MG tablet Take 500 mg by mouth every 8 (eight) hours as needed for moderate pain.    [provider]  albuterol (PROVENTIL HFA;VENTOLIN HFA) 108 (90 Base) MCG/ACT inhaler Inhale 2 puffs into the lungs every 4 (four) hours as needed for wheezing or shortness of breath.    [provider]  albuterol (PROVENTIL) (2.5 MG/3ML) 0.083% nebulizer solution Take 2.5 mg by nebulization every 4 (four) hours as needed for wheezing or shortness of breath.  [provider]  amLODipine (NORVASC) 10 MG tablet Take 10 mg by mouth daily.    [provider]  arformoterol (BROVANA) 15 MCG/2ML NEBU Take 15 mcg by nebulization 2 (two) times daily.    [provider]  budesonide (PULMICORT) 0.5 MG/2ML nebulizer solution Take 0.5 mg by nebulization 2 (two) times daily.    [provider]  cetirizine (ZYRTEC) 10 MG tablet Chew 10 mg by mouth daily.    [provider]  diclofenac Sodium (VOLTAREN) 1 %  GEL Apply 1 application topically 2 (two) times daily as needed (pain).    [provider]  Dupilumab 300 MG/2ML SOPN Inject into the skin. 04/11/22   [provider]  famotidine (PEPCID) 40 MG tablet Take 40 mg by mouth at bedtime. Patient not taking: Reported on 10/20/2022    [provider]  fluticasone (FLONASE) 50 MCG/ACT nasal spray Place 1 spray into both nostrils daily.    [provider]  hydrochlorothiazide (HYDRODIURIL) 25 MG tablet Take 25 mg by mouth daily.    [provider]  lidocaine-prilocaine (EMLA) cream Apply to affected area once 04/29/22   Creig Hines, MD  losartan (COZAAR) 25 MG tablet Take 25 mg by mouth daily. 09/12/22   [provider]  predniSONE (DELTASONE) 10 MG tablet Take 10 mg by mouth daily. 09/19/22   [provider]  predniSONE (DELTASONE) 5 MG tablet Take 7.5 mg by mouth daily.    [provider]  simvastatin (ZOCOR) 20 MG tablet Take by mouth. 07/27/10   [provider]  traMADol (ULTRAM) 50 MG tablet Take 50 mg by mouth 2 (two) times daily.    [provider]     ALLERGIES:  Allergies  Allergen Reactions   Shrimp [Shellfish Allergy] Other (See Comments)    Per MD comments critical     SOCIAL HISTORY:  Social History   Socioeconomic History   Marital status: Married    Spouse name: Not on file   Number of children: Not on file   Years of education: Not on file   Highest education level: Not on file  Occupational History   Not on file  Tobacco Use   Smoking status: Former    Packs/day: 1.50    Years: 40.00    Additional pack years: 0.00    Total pack years: 60.00    Types: Cigarettes    Quit date: 05/21/2008    Years since quitting: 14.4    Passive exposure: Past   Smokeless tobacco: Never  Vaping Use   Vaping Use: Never used  Substance and Sexual Activity   Alcohol use: Not Currently   Drug use: Never   Sexual activity: Not Currently  Other Topics  Concern   Not on file  Social History Narrative   Not on file   Social Determinants of Health   Financial Resource Strain: Not on file  Food Insecurity: Not on file  Transportation Needs: Not on file  Physical Activity: Not on file  Stress: Not on file  Social Connections: Not on file  Intimate Partner Violence: Not on file      FAMILY HISTORY:  Family History  Problem Relation Age of Onset   Asthma Mother      REVIEW OF SYSTEMS:  Constitutional: denies weight loss, fever, chills, or sweats  Eyes: denies any other vision changes, history of eye injury  ENT: denies sore throat, hearing problems  Respiratory: Positive shortness of breath, wheezing  Cardiovascular: denies  chest pain, palpitations  Gastrointestinal: Positive abdominal pain Genitourinary: denies burning with urination or urinary frequency Musculoskeletal: denies any other joint pains or cramps  Skin: denies any other rashes or skin discolorations  Neurological: denies any other headache, dizziness, weakness  Psychiatric: denies any other depression, anxiety   All other review of systems were negative   VITAL SIGNS:  Temp:  [98.4 F (36.9 C)] 98.4 F (36.9 C) (06/09 0807) Pulse Rate:  [91] 91 (06/09 0807) Resp:  [24] 24 (06/09 0807) BP: (136)/(83) 136/83 (06/09 0807) SpO2:  [99 %] 99 % (06/09 0807) Weight:  [69.9 kg] 69.9 kg (06/09 0808)     Height: 5\' 9"  (175.3 cm) Weight: 69.9 kg BMI (Calculated): 22.73   INTAKE/OUTPUT:  This shift: No intake/output data recorded.  Last 2 shifts: @IOLAST2SHIFTS @   PHYSICAL EXAM:  Constitutional:  -- Normal body habitus  -- Awake, alert, and oriented x3  Eyes:  -- Pupils equally round and reactive to light  -- No scleral icterus  Ear, nose, and throat:  -- No jugular venous distension  Pulmonary:  -- No crackles  -- Equal breath sounds bilaterally -- Breathing non-labored at rest Cardiovascular:  -- S1, S2 present  -- No pericardial  rubs Gastrointestinal:  -- Abdomen soft, tender to palpation in the left lower quadrant, distended, with guarding and rebound tenderness -- No abdominal masses appreciated, pulsatile or otherwise  Musculoskeletal and Integumentary:  -- Wounds: None appreciated -- Extremities: B/L UE and LE FROM, hands and feet warm, no edema  Neurologic:  -- Motor function: intact and symmetric -- Sensation: intact and symmetric   Labs:     Latest Ref Rng & Units 10/20/2022    8:26 AM 04/29/2022   10:02 AM 04/24/2021   10:11 AM  CBC  WBC 4.0 - 10.5 K/uL 12.4  7.2  5.9   Hemoglobin 13.0 - 17.0 g/dL 86.5  78.4  69.6   Hematocrit 39.0 - 52.0 % 44.0  42.5  40.8   Platelets 150 - 400 K/uL 348  261  228       Latest Ref Rng & Units 10/20/2022    8:35 AM 10/20/2022    8:26 AM 04/29/2022   10:02 AM  CMP  Glucose 70 - 99 mg/dL  295  284   BUN 8 - 23 mg/dL  19  19   Creatinine 1.32 - 1.24 mg/dL  4.40  1.02   Sodium 725 - 145 mmol/L  137  141   Potassium 3.5 - 5.1 mmol/L  3.9  3.4   Chloride 98 - 111 mmol/L  96  103   CO2 22 - 32 mmol/L  29  27   Calcium 8.9 - 10.3 mg/dL  9.0  8.5   Total Protein 6.5 - 8.1 g/dL 7.0   6.6   Total Bilirubin 0.3 - 1.2 mg/dL 1.0   0.6   Alkaline Phos 38 - 126 U/L 59   59   AST 15 - 41 U/L 23   22   ALT 0 - 44 U/L 29   35     Imaging studies:  EXAM: CT ANGIOGRAPHY CHEST   CT ABDOMEN AND PELVIS WITH CONTRAST   TECHNIQUE: Multidetector CT imaging of the chest was performed using the standard protocol during bolus administration of intravenous contrast. Multiplanar CT image reconstructions and MIPs were obtained to evaluate the vascular anatomy. Multidetector CT imaging of the abdomen and pelvis was performed using the standard protocol during bolus administration of intravenous contrast.  RADIATION DOSE REDUCTION: This exam was performed according to the departmental dose-optimization program which includes automated exposure control, adjustment of the mA and/or  kV according to patient size and/or use of iterative reconstruction technique.   CONTRAST:  75mL OMNIPAQUE IOHEXOL 350 MG/ML SOLN   COMPARISON:  Chest radiograph from earlier today. 04/24/2022 CT chest, abdomen and pelvis.   FINDINGS: CTA CHEST FINDINGS   Cardiovascular: The study is high quality for the evaluation of pulmonary embolism. There are no filling defects in the central, lobar, segmental or subsegmental pulmonary artery branches to suggest acute pulmonary embolism. Atherosclerotic nonaneurysmal thoracic aorta. Normal caliber pulmonary arteries. Normal heart size. No significant pericardial fluid/thickening. Three-vessel coronary atherosclerosis. Right internal jugular Port-A-Cath terminates in the lower third of the SVC.   Mediastinum/Nodes: No significant thyroid nodules. Unremarkable esophagus. No pathologically enlarged axillary, mediastinal or hilar lymph nodes.   Lungs/Pleura: No pneumothorax. No pleural effusion. Severe centrilobular emphysema with mild diffuse bronchial wall thickening. No acute consolidative airspace disease or lung masses. Scattered tiny calcified granulomas in the peripheral upper lobes are unchanged. No significant pulmonary nodules.   Musculoskeletal: No aggressive appearing focal osseous lesions. Mild thoracic spondylosis.   Review of the MIP images confirms the above findings.   CT ABDOMEN and PELVIS FINDINGS   Motion degraded scan, limiting assessment.   Hepatobiliary: For normal liver size. Subcentimeter hypodense posteroinferior right liver lesion, too small to characterize, unchanged, presumably benign. No new liver lesions. Normal gallbladder with no radiopaque cholelithiasis. No biliary ductal dilatation.   Pancreas: Normal, with no mass or duct dilation.   Spleen: Normal size. No mass.   Adrenals/Urinary Tract: Normal adrenals. Simple exophytic 1.9 cm renal cyst in the anterior interpolar right kidney with  numerous additional subcentimeter hypodense bilateral renal cortical lesions that are too small to characterize, for which no follow-up imaging is recommended. No hydronephrosis. Normal bladder.   Stomach/Bowel: Normal non-distended stomach. Normal caliber small bowel with no small bowel wall thickening. Normal appendix. Marked left colonic diverticulosis, most prominent in the sigmoid colon. There is focal pericolonic fat stranding and ill-defined free fluid with scattered free air in the region of the mid to distal sigmoid colon in the right pelvis (series 4/image 49), suggesting perforated sigmoid diverticulitis. No discrete measurable/drainable pericolonic abscess.   Vascular/Lymphatic: Atherosclerotic nonaneurysmal abdominal aorta. Patent portal, splenic, hepatic and renal veins. No pathologically enlarged lymph nodes in the abdomen or pelvis.   Reproductive: Mild prostatomegaly.   Other: Scattered small to moderate free air throughout the anterior upper peritoneal cavity (series 4/image 9). No ascites. Tiny chronic fat containing umbilical hernia.   Musculoskeletal: No aggressive appearing focal osseous lesions. Moderate thoracolumbar spondylosis.   Review of the MIP images confirms the above findings.   IMPRESSION: 1. No pulmonary embolism. 2. Small to moderate pneumoperitoneum, suspected to be due to perforated sigmoid diverticulitis. No abscess. 3. No evidence of metastatic disease in the chest, abdomen or pelvis. 4. Three-vessel coronary atherosclerosis. 5. Severe centrilobular emphysema with mild diffuse bronchial wall thickening, suggesting COPD. 6. Mild prostatomegaly. 7. Aortic Atherosclerosis (ICD10-I70.0) and Emphysema (ICD10-J43.9).   Critical Value/emergent results were called by telephone at the time of interpretation on 10/20/2022 at 10:36 am to provider [facility] , who verbally acknowledged these results.     Electronically Signed   By: Delbert Phenix M.D.   On: 10/20/2022 10:38  Assessment/Plan:  77 y.o. male with perforated diverticulitis, complicated by pertinent comorbidities including COPD on home oxygen.  -Patient with clinical exam with acute  abdomen and CT scan consistent with perforated diverticulitis -Discussed with patient his findings and recommendation of exploratory laparotomy with partial colectomy and ostomy creation -Discussed with patient high risk of surgery due to his chronic COPD on home oxygen. -Will admit for emergent surgical intervention.  Appreciate hospitalist for management of his chronic COPD.  Gae Gallop, MD

## 2022-10-20 NOTE — ED Provider Notes (Signed)
[facility] Provider Note    Event Date/Time   First MD Initiated Contact with Patient 10/20/22 [phone]     (approximate)   History   Chief Complaint Dysuria and Shortness of Breath   HPI  Joe Torres is a 77 y.o. male with past medical history of hypertension, hyperlipidemia, COPD, asthma, chronic hypoxic respiratory failure on 3 L nasal cannula, and bladder cancer who presents to the ED complaining of shortness of breath.  Patient reports that around 2:00 this morning he began having increasing difficulty breathing along with pain in his lower abdomen.  He has chronic shortness of breath at baseline, but states breathing has gotten worse than usual with a cough productive of whitish sputum.  He has felt feverish with chills, but has not taken his temperature at home.  He additionally reports bilateral lower quadrant abdominal pain increasing this morning.  He has felt nauseous but has not vomited, endorses dysuria but denies any changes in his bowel movements.  He has a history of bladder cancer but is not currently receiving treatment.     Physical Exam   Triage Vital Signs: ED Triage Vitals  Enc Vitals Group     BP 10/20/22 0807 136/83     Pulse Rate 10/20/22 0807 91     Resp 10/20/22 0807 (!) 24     Temp 10/20/22 0807 98.4 F (36.9 C)     Temp src --      SpO2 10/20/22 0807 99 %     Weight 10/20/22 0808 154 lb (69.9 kg)     Height 10/20/22 0808 5\' 9"  (1.753 m)     Head Circumference --      Peak Flow --      Pain Score 10/20/22 0808 8     Pain Loc --      Pain Edu? --      Excl. in GC? --     Most recent vital signs: Vitals:   10/20/22 0807  BP: 136/83  Pulse: 91  Resp: (!) 24  Temp: 98.4 F (36.9 C)  SpO2: 99%    Constitutional: Alert and oriented. Eyes: Conjunctivae are normal. Head: Atraumatic. Nose: No congestion/rhinnorhea. Mouth/Throat: Mucous membranes are moist.  Cardiovascular: Normal rate, regular rhythm. Grossly normal  heart sounds.  2+ radial pulses bilaterally. Respiratory: Tachypneic with increased respiratory effort, no distress noted.  Poor air movement noted with no audible wheezing or rails. Gastrointestinal: Soft and tender to palpation in the bilateral lower quadrants with no rebound or guarding.  No distention. Musculoskeletal: No lower extremity tenderness nor edema.  Neurologic:  Normal speech and language. No gross focal neurologic deficits are appreciated.    ED Results / Procedures / Treatments   Labs (all labs ordered are listed, but only abnormal results are displayed) Labs Reviewed  BASIC METABOLIC PANEL - Abnormal; Notable for the following components:      Result Value   Chloride 96 (*)    Glucose, Bld 105 (*)    All other components within normal limits  CBC - Abnormal; Notable for the following components:   WBC 12.4 (*)    All other components within normal limits  URINALYSIS, ROUTINE W REFLEX MICROSCOPIC - Abnormal; Notable for the following components:   Color, Urine YELLOW (*)    APPearance CLEAR (*)    All other components within normal limits  HEPATIC FUNCTION PANEL  LIPASE, BLOOD  TROPONIN I (HIGH SENSITIVITY)  TROPONIN I (HIGH SENSITIVITY)  EKG  ED ECG REPORT I, Chesley Noon, the attending physician, personally viewed and interpreted this ECG.   Date: 10/20/2022  EKG Time: 8:09  Rate: 90  Rhythm: normal sinus rhythm, frequent PVC's noted  Axis: Normal  Intervals:right bundle branch block  ST&T Change: None  RADIOLOGY Chest x-ray reviewed and interpreted by me with no infiltrate, edema, or effusion.  PROCEDURES:  Critical Care performed: Yes, see critical care procedure note(s)  .Critical Care  Performed by: Chesley Noon, MD Authorized by: Chesley Noon, MD   Critical care provider statement:    Critical care time (minutes):  30   Critical care time was exclusive of:  Separately billable procedures and treating other patients and  teaching time   Critical care was necessary to treat or prevent imminent or life-threatening deterioration of the following conditions: Perforated abdominal viscous.   Critical care was time spent personally by me on the following activities:  Development of treatment plan with patient or surrogate, discussions with consultants, evaluation of patient's response to treatment, examination of patient, ordering and review of laboratory studies, ordering and review of radiographic studies, ordering and performing treatments and interventions, pulse oximetry, re-evaluation of patient's condition and review of old charts   I assumed direction of critical care for this patient from another provider in my specialty: no     Care discussed with: admitting provider      MEDICATIONS ORDERED IN ED: Medications  piperacillin-tazobactam (ZOSYN) IVPB 3.375 g (has no administration in time range)  methylPREDNISolone sodium succinate (SOLU-MEDROL) 125 mg/2 mL injection 125 mg (has no administration in time range)  ipratropium-albuterol (DUONEB) 0.5-2.5 (3) MG/3ML nebulizer solution 3 mL (has no administration in time range)  morphine (PF) 4 MG/ML injection 4 mg (4 mg Intravenous Given 10/20/22 0844)  ondansetron (ZOFRAN) injection 4 mg (4 mg Intravenous Given 10/20/22 0844)  ipratropium-albuterol (DUONEB) 0.5-2.5 (3) MG/3ML nebulizer solution 3 mL (3 mLs Nebulization Given 10/20/22 0847)  iohexol (OMNIPAQUE) 350 MG/ML injection 75 mL (75 mLs Intravenous Contrast Given 10/20/22 0948)     IMPRESSION / MDM / ASSESSMENT AND PLAN / ED COURSE  I reviewed the triage vital signs and the nursing notes.                              77 y.o. male with past medical history of hypertension, hyperlipidemia, COPD, chronic hypoxic respiratory failure on 3 L nasal cannula, and bladder cancer who presents to the ED complaining of increasing difficulty breathing and lower abdominal pain with dysuria since 2 AM this morning.  Patient's  presentation is most consistent with acute presentation with potential threat to life or bodily function.  Differential diagnosis includes, but is not limited to, ACS, PE, pneumonia, CHF, COPD exacerbation, UTI, kidney stone, appendicitis, diverticulitis, colitis, bowel obstruction.  Patient nontoxic-appearing but does have increased work of breathing with some tachypnea and poor air movement noted without audible wheezing.  EKG shows frequent PVCs and right bundle branch block with no obvious ischemic changes, lower suspicion for ACS but troponin pending at this time.  We will further assess with CTA of his chest to rule out PE given his recent history of malignancy.  Also plan to check CT imaging of his abdomen/pelvis given abdominal tenderness, however abdominal discomfort could be related to UTI.  Additional labs and urinalysis are pending at this time, will treat symptomatically with IV morphine and Zofran.  CTA chest is negative for PE or  other intrathoracic process, however free air noted on CT of his abdomen/pelvis.  Per discussion with radiology, this appears likely due to perforated diverticulitis.  We will give dose of IV Zosyn and case discussed with Dr. Maia Plan of general surgery, will plan for operative intervention later this afternoon.  Labs remarkable for leukocytosis, no significant electrolyte abnormality or AKI noted.  LFTs and lipase are unremarkable and troponin within normal limits.  Patient does have more audible wheezing on reassessment, will give IV Solu-Medrol and additional DuoNeb.  Case discussed with hospitalist for admission.      FINAL CLINICAL IMPRESSION(S) / ED DIAGNOSES   Final diagnoses:  Perforated abdominal viscus  COPD exacerbation (HCC)     Rx / DC Orders   ED Discharge Orders     None        Note:  This document was prepared using Dragon voice recognition software and may include unintentional dictation errors.   Chesley Noon, MD 10/20/22  1054

## 2022-10-20 NOTE — Assessment & Plan Note (Signed)
-   Flomax 

## 2022-10-20 NOTE — H&P (Addendum)
History and Physical    Patient: Joe Torres WUJ:[id number] DOB: [birth year] DOA: 10/20/2022 DOS: the patient was seen and examined on 10/20/2022 PCP: [facility]  Patient coming from: Home  Chief Complaint:  Chief Complaint  Patient presents with   Dysuria   Shortness of Breath   HPI: Joe Torres is a 77 y.o. male with medical history significant of chronic respiratory failure on 2 to 3 L, COPD, GERD, hypertension, history of bladder cancer in remission presenting with abdominal pain, perforated diverticulitis, COPD exacerbation, acute on chronic respiratory failure hypoxia.  Patient ports increased work of breathing over the past 4 to 5 days.  Positive wheezing cough, increased sputum production.  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  Positive increased work of breathing despite inhaler use.  No reported sick contacts.  No fevers or chills.  No hemiparesis or confusion.  Patient also ports worsening lower abdominal pain over the past 3 days.  Mild nausea but no vomiting.  No reported diarrhea.  No reported recent dietary changes.  Prior surgical history includes transurethral resection of bladder tumor in 2022 as well as chemoradiation to the area.  Noted prior colonoscopy September 2022 with reported diverticulosis.  Also with hernia repair in 2017. Presented to the ER afebrile, hemodynamically stable, satting 9 9% on 3 L.  White count 12.4, hemoglobin 14.3, platelets 348, creatinine 1.11, urinalysis within normal limits.  CTA of the chest as well as CT of the abdomen pelvis obtained showing severe centrilobular emphysema concerning for COPD as well as small to moderate pneumoperitoneum with concern for perforated sigmoid diverticulitis without abscess.  Dr. Maia Plan with general surgery evaluating patient with plan for operative repair today. Review of Systems: As mentioned in the history of present illness. All  other systems reviewed and are negative. Past Medical History:  Diagnosis Date   Arthritis    Asthma    Bladder cancer (HCC)    Cancer (HCC)    COPD (chronic obstructive pulmonary disease) (HCC)    Dyspnea    GERD (gastroesophageal reflux disease)    Hypertension    Past Surgical History:  Procedure Laterality Date   COLONOSCOPY     COLONOSCOPY WITH PROPOFOL N/A 01/16/2021   Procedure: COLONOSCOPY WITH PROPOFOL;  Surgeon: Wyline Mood, MD;  Location: [facility] ENDOSCOPY;  Service: Gastroenterology;  Laterality: N/A;  SPANISH INTERPRETER 9 AM ARRIVAL REQUEST   HERNIA REPAIR  2017   PORTA CATH INSERTION N/A 06/19/2020   Procedure: PORTA CATH INSERTION;  Surgeon: Annice Needy, MD;  Location: [facility] INVASIVE CV LAB;  Service: Cardiovascular;  Laterality: N/A;   TRANSURETHRAL RESECTION OF BLADDER TUMOR WITH GYRUS (TURBT-GYRUS)  07/2020   TRANSURETHRAL RESECTION OF BLADDER TUMOR WITH MITOMYCIN-C N/A 05/22/2020   Procedure: TRANSURETHRAL RESECTION OF BLADDER TUMOR WITH Gemcitabine;  Surgeon: Vanna Scotland, MD;  Location: [facility] ORS;  Service: Urology;  Laterality: N/A;   Social History:  reports that he quit smoking about 14 years ago. His smoking use included cigarettes. He has a 60.00 pack-year smoking history. He has been exposed to tobacco smoke. He has never used smokeless tobacco. He reports that he does not currently use alcohol. He reports that he does not use drugs.  Allergies  Allergen Reactions   Shrimp [Shellfish Allergy] Other (See Comments)    Per MD comments critical    Family History  Problem Relation Age of Onset   Asthma Mother  Prior to Admission medications   Medication Sig Start Date End Date Taking? Authorizing Provider  KLOR-CON M20 20 MEQ tablet TAKE 1 TABLET BY MOUTH TWICE A DAY FOR 14 DAYS, FOLLOWED BY 1 TABLET DAILY 08/22/22  Yes Creig Hines, MD  montelukast (SINGULAIR) 10 MG tablet Take 10 mg by mouth at bedtime.   Yes [provider]  pantoprazole  (PROTONIX) 40 MG tablet Take 40 mg by mouth daily.   Yes [provider]  roflumilast (DALIRESP) 500 MCG TABS tablet Take 500 mcg by mouth daily.   Yes [provider]  sucralfate (CARAFATE) 1 g tablet Take by mouth. 07/12/21  Yes [provider]  tamsulosin (FLOMAX) 0.4 MG CAPS capsule TAKE 1 CAPSULE BY MOUTH EVERY DAY 10/23/21  Yes Vanna Scotland, MD  tiotropium (SPIRIVA) 18 MCG inhalation capsule Place 18 mcg into inhaler and inhale daily.   Yes [provider]  acetaminophen (TYLENOL) 500 MG tablet Take 500 mg by mouth every 8 (eight) hours as needed for moderate pain.    [provider]  albuterol (PROVENTIL HFA;VENTOLIN HFA) 108 (90 Base) MCG/ACT inhaler Inhale 2 puffs into the lungs every 4 (four) hours as needed for wheezing or shortness of breath.    [provider]  albuterol (PROVENTIL) (2.5 MG/3ML) 0.083% nebulizer solution Take 2.5 mg by nebulization every 4 (four) hours as needed for wheezing or shortness of breath.    [provider]  amLODipine (NORVASC) 10 MG tablet Take 10 mg by mouth daily.    [provider]  arformoterol (BROVANA) 15 MCG/2ML NEBU Take 15 mcg by nebulization 2 (two) times daily.    [provider]  budesonide (PULMICORT) 0.5 MG/2ML nebulizer solution Take 0.5 mg by nebulization 2 (two) times daily.    [provider]  cetirizine (ZYRTEC) 10 MG tablet Chew 10 mg by mouth daily.    [provider]  diclofenac Sodium (VOLTAREN) 1 % GEL Apply 1 application topically 2 (two) times daily as needed (pain).    [provider]  Dupilumab 300 MG/2ML SOPN Inject into the skin. 04/11/22   [provider]  famotidine (PEPCID) 40 MG tablet Take 40 mg by mouth at bedtime. Patient not taking: Reported on 10/20/2022    [provider]  fluticasone (FLONASE) 50 MCG/ACT nasal spray Place 1 spray into both nostrils daily.    [provider]   hydrochlorothiazide (HYDRODIURIL) 25 MG tablet Take 25 mg by mouth daily.    [provider]  lidocaine-prilocaine (EMLA) cream Apply to affected area once 04/29/22   Creig Hines, MD  losartan (COZAAR) 25 MG tablet Take 25 mg by mouth daily. 09/12/22   [provider]  predniSONE (DELTASONE) 10 MG tablet Take 10 mg by mouth daily. 09/19/22   [provider]  predniSONE (DELTASONE) 5 MG tablet Take 7.5 mg by mouth daily.    [provider]  simvastatin (ZOCOR) 20 MG tablet Take by mouth. 07/27/10   [provider]  traMADol (ULTRAM) 50 MG tablet Take 50 mg by mouth 2 (two) times daily.    [provider]    Physical Exam: Vitals:   10/20/22 0807 10/20/22 0808  BP: 136/83   Pulse: 91   Resp: (!) 24   Temp: 98.4 F (36.9 C)   SpO2: 99%   Weight:  69.9 kg  Height:  5\' 9"  (1.753 m)   Physical Exam Constitutional:      Appearance: He is normal weight.  Comments: Minimal to mild increased work of breathing  HENT:     Head: Normocephalic and atraumatic.     Nose: Nose normal.     Mouth/Throat:     Mouth: Mucous membranes are moist.  Eyes:     Pupils: Pupils are equal, round, and reactive to light.  Cardiovascular:     Rate and Rhythm: Normal rate and regular rhythm.  Pulmonary:     Comments: Minimal to mild increased work of breathing Diffuse expiratory wheezes Abdominal:     Comments: Positive bowel sounds Positive lower abdominal tenderness to palpation  Musculoskeletal:        General: Normal range of motion.     Cervical back: Normal range of motion.  Skin:    General: Skin is warm.  Neurological:     General: No focal deficit present.  Psychiatric:        Mood and Affect: Mood normal.     Data Reviewed:  There are no new results to review at this time. CT ABDOMEN PELVIS W CONTRAST CLINICAL DATA:  Abdominal pain, acute, nonlocalized; Pulmonary embolism (PE) suspected, high prob. History of bladder cancer.  * Tracking Code: BO *  EXAM: CT ANGIOGRAPHY CHEST  CT ABDOMEN AND PELVIS WITH CONTRAST  TECHNIQUE: Multidetector CT imaging of the chest was performed using the standard protocol during bolus administration of intravenous contrast. Multiplanar CT image reconstructions and MIPs were obtained to evaluate the vascular anatomy. Multidetector CT imaging of the abdomen and pelvis was performed using the standard protocol during bolus administration of intravenous contrast.  RADIATION DOSE REDUCTION: This exam was performed according to the departmental dose-optimization program which includes automated exposure control, adjustment of the mA and/or kV according to patient size and/or use of iterative reconstruction technique.  CONTRAST:  75mL OMNIPAQUE IOHEXOL 350 MG/ML SOLN  COMPARISON:  Chest radiograph from earlier today. 04/24/2022 CT chest, abdomen and pelvis.  FINDINGS: CTA CHEST FINDINGS  Cardiovascular: The study is high quality for the evaluation of pulmonary embolism. There are no filling defects in the central, lobar, segmental or subsegmental pulmonary artery branches to suggest acute pulmonary embolism. Atherosclerotic nonaneurysmal thoracic aorta. Normal caliber pulmonary arteries. Normal heart size. No significant pericardial fluid/thickening. Three-vessel coronary atherosclerosis. Right internal jugular Port-A-Cath terminates in the lower third of the SVC.  Mediastinum/Nodes: No significant thyroid nodules. Unremarkable esophagus. No pathologically enlarged axillary, mediastinal or hilar lymph nodes.  Lungs/Pleura: No pneumothorax. No pleural effusion. Severe centrilobular emphysema with mild diffuse bronchial wall thickening. No acute consolidative airspace disease or lung masses. Scattered tiny calcified granulomas in the peripheral upper lobes are unchanged. No significant pulmonary nodules.  Musculoskeletal: No aggressive appearing focal osseous lesions.  Mild thoracic spondylosis.  Review of the MIP images confirms the above findings.  CT ABDOMEN and PELVIS FINDINGS  Motion degraded scan, limiting assessment.  Hepatobiliary: For normal liver size. Subcentimeter hypodense posteroinferior right liver lesion, too small to characterize, unchanged, presumably benign. No new liver lesions. Normal gallbladder with no radiopaque cholelithiasis. No biliary ductal dilatation.  Pancreas: Normal, with no mass or duct dilation.  Spleen: Normal size. No mass.  Adrenals/Urinary Tract: Normal adrenals. Simple exophytic 1.9 cm renal cyst in the anterior interpolar right kidney with numerous additional subcentimeter hypodense bilateral renal cortical lesions that are too small to characterize, for which no follow-up imaging is recommended. No hydronephrosis. Normal bladder.  Stomach/Bowel: Normal non-distended stomach. Normal caliber small bowel with no small bowel wall thickening. Normal appendix. Marked left colonic diverticulosis, most prominent in the  sigmoid colon. There is focal pericolonic fat stranding and ill-defined free fluid with scattered free air in the region of the mid to distal sigmoid colon in the right pelvis (series 4/image 49), suggesting perforated sigmoid diverticulitis. No discrete measurable/drainable pericolonic abscess.  Vascular/Lymphatic: Atherosclerotic nonaneurysmal abdominal aorta. Patent portal, splenic, hepatic and renal veins. No pathologically enlarged lymph nodes in the abdomen or pelvis.  Reproductive: Mild prostatomegaly.  Other: Scattered small to moderate free air throughout the anterior upper peritoneal cavity (series 4/image 9). No ascites. Tiny chronic fat containing umbilical hernia.  Musculoskeletal: No aggressive appearing focal osseous lesions. Moderate thoracolumbar spondylosis.  Review of the MIP images confirms the above findings.  IMPRESSION: 1. No pulmonary embolism. 2. Small to  moderate pneumoperitoneum, suspected to be due to perforated sigmoid diverticulitis. No abscess. 3. No evidence of metastatic disease in the chest, abdomen or pelvis. 4. Three-vessel coronary atherosclerosis. 5. Severe centrilobular emphysema with mild diffuse bronchial wall thickening, suggesting COPD. 6. Mild prostatomegaly. 7. Aortic Atherosclerosis (ICD10-I70.0) and Emphysema (ICD10-J43.9).  Critical Value/emergent results were called by telephone at the time of interpretation on 10/20/2022 at 10:36 am to provider [facility] , who verbally acknowledged these results.  Electronically Signed   By: Delbert Phenix M.D.   On: 10/20/2022 10:38 CT Angio Chest PE W/Cm &/Or Wo Cm CLINICAL DATA:  Abdominal pain, acute, nonlocalized; Pulmonary embolism (PE) suspected, high prob. History of bladder cancer. * Tracking Code: BO *  EXAM: CT ANGIOGRAPHY CHEST  CT ABDOMEN AND PELVIS WITH CONTRAST  TECHNIQUE: Multidetector CT imaging of the chest was performed using the standard protocol during bolus administration of intravenous contrast. Multiplanar CT image reconstructions and MIPs were obtained to evaluate the vascular anatomy. Multidetector CT imaging of the abdomen and pelvis was performed using the standard protocol during bolus administration of intravenous contrast.  RADIATION DOSE REDUCTION: This exam was performed according to the departmental dose-optimization program which includes automated exposure control, adjustment of the mA and/or kV according to patient size and/or use of iterative reconstruction technique.  CONTRAST:  75mL OMNIPAQUE IOHEXOL 350 MG/ML SOLN  COMPARISON:  Chest radiograph from earlier today. 04/24/2022 CT chest, abdomen and pelvis.  FINDINGS: CTA CHEST FINDINGS  Cardiovascular: The study is high quality for the evaluation of pulmonary embolism. There are no filling defects in the central, lobar, segmental or subsegmental pulmonary artery branches  to suggest acute pulmonary embolism. Atherosclerotic nonaneurysmal thoracic aorta. Normal caliber pulmonary arteries. Normal heart size. No significant pericardial fluid/thickening. Three-vessel coronary atherosclerosis. Right internal jugular Port-A-Cath terminates in the lower third of the SVC.  Mediastinum/Nodes: No significant thyroid nodules. Unremarkable esophagus. No pathologically enlarged axillary, mediastinal or hilar lymph nodes.  Lungs/Pleura: No pneumothorax. No pleural effusion. Severe centrilobular emphysema with mild diffuse bronchial wall thickening. No acute consolidative airspace disease or lung masses. Scattered tiny calcified granulomas in the peripheral upper lobes are unchanged. No significant pulmonary nodules.  Musculoskeletal: No aggressive appearing focal osseous lesions. Mild thoracic spondylosis.  Review of the MIP images confirms the above findings.  CT ABDOMEN and PELVIS FINDINGS  Motion degraded scan, limiting assessment.  Hepatobiliary: For normal liver size. Subcentimeter hypodense posteroinferior right liver lesion, too small to characterize, unchanged, presumably benign. No new liver lesions. Normal gallbladder with no radiopaque cholelithiasis. No biliary ductal dilatation.  Pancreas: Normal, with no mass or duct dilation.  Spleen: Normal size. No mass.  Adrenals/Urinary Tract: Normal adrenals. Simple exophytic 1.9 cm renal cyst in the anterior interpolar right kidney with numerous additional subcentimeter hypodense bilateral  renal cortical lesions that are too small to characterize, for which no follow-up imaging is recommended. No hydronephrosis. Normal bladder.  Stomach/Bowel: Normal non-distended stomach. Normal caliber small bowel with no small bowel wall thickening. Normal appendix. Marked left colonic diverticulosis, most prominent in the sigmoid colon. There is focal pericolonic fat stranding and ill-defined free fluid with  scattered free air in the region of the mid to distal sigmoid colon in the right pelvis (series 4/image 49), suggesting perforated sigmoid diverticulitis. No discrete measurable/drainable pericolonic abscess.  Vascular/Lymphatic: Atherosclerotic nonaneurysmal abdominal aorta. Patent portal, splenic, hepatic and renal veins. No pathologically enlarged lymph nodes in the abdomen or pelvis.  Reproductive: Mild prostatomegaly.  Other: Scattered small to moderate free air throughout the anterior upper peritoneal cavity (series 4/image 9). No ascites. Tiny chronic fat containing umbilical hernia.  Musculoskeletal: No aggressive appearing focal osseous lesions. Moderate thoracolumbar spondylosis.  Review of the MIP images confirms the above findings.  IMPRESSION: 1. No pulmonary embolism. 2. Small to moderate pneumoperitoneum, suspected to be due to perforated sigmoid diverticulitis. No abscess. 3. No evidence of metastatic disease in the chest, abdomen or pelvis. 4. Three-vessel coronary atherosclerosis. 5. Severe centrilobular emphysema with mild diffuse bronchial wall thickening, suggesting COPD. 6. Mild prostatomegaly. 7. Aortic Atherosclerosis (ICD10-I70.0) and Emphysema (ICD10-J43.9).  Critical Value/emergent results were called by telephone at the time of interpretation on 10/20/2022 at 10:36 am to provider [facility] , who verbally acknowledged these results.  Electronically Signed   By: Delbert Phenix M.D.   On: 10/20/2022 10:38 DG Chest 2 View CLINICAL DATA:  Dysuria and shortness of breath.  EXAM: CHEST - 2 VIEW  COMPARISON:  Chest radiograph 01/22/2021  FINDINGS: The right chest wall port is stable with tip terminating in the mid SVC.  The lungs hyperinflated with flattening of the diaphragms consistent with underlying COPD. Linear opacity in the lateral right base likely reflects scar when correlated with prior CT. There is no other focal airspace opacity.  There is no pulmonary edema. There is no pleural effusion or pneumothorax  There is no acute osseous abnormality. Mild compression deformity of the midthoracic vertebral body is unchanged.  IMPRESSION: COPD. Otherwise, no radiographic evidence of acute cardiopulmonary process.  Electronically Signed   By: Lesia Hausen M.D.   On: 10/20/2022 09:11  Lab Results  Component Value Date   WBC 12.4 (H) 10/20/2022   HGB 14.3 10/20/2022   HCT 44.0 10/20/2022   MCV 90.9 10/20/2022   PLT 348 10/20/2022   Last metabolic panel Lab Results  Component Value Date   GLUCOSE 105 (H) 10/20/2022   NA 137 10/20/2022   K 3.9 10/20/2022   CL 96 (L) 10/20/2022   CO2 29 10/20/2022   BUN 19 10/20/2022   CREATININE 1.11 10/20/2022   GFRNONAA >60 10/20/2022   CALCIUM 9.0 10/20/2022   PROT 7.0 10/20/2022   ALBUMIN 4.1 10/20/2022   BILITOT 1.0 10/20/2022   ALKPHOS 59 10/20/2022   AST 23 10/20/2022   ALT 29 10/20/2022   ANIONGAP 12 10/20/2022    Assessment and Plan: * Diverticulitis of colon with perforation Noted lower abdominal pain x 2 to 3 days with diverticulitis with perforation on CT scan Dr. Maia Plan with general surgery managing Follow-up  Acute on chronic respiratory failure with hypoxia (HCC) Acute decompensated respiratory failure in the setting of COPD exacerbation with cough wheezing, increased sputum reduction Baseline home O2 use around 2-3L Currently on 3 L Positive diffuse wheezing on exam CT with centrilobular emphysema concerning  for severe COPD IV Solu-Medrol DuoNebs Continue Zosyn in the setting of diverticulitis coverage Continue the rest of patient's respiratory regimen including Daliresp Titrate back to chronic prednisone at 10 mg daily pending resolution of acute flare   Malignant neoplasm of overlapping sites of bladder (HCC) Noted prior history of high-grade urothelial carcinoma  s/p concurrent chemoradiation with 5-FU mitomycin ending in March 2022  No  evidence of disease per 05/2022 Rad-onc note Follow   Hypertension BP stable Titrate home regimen  Hyperlipidemia Continue statin  GERD (gastroesophageal reflux disease) PPI  BPH (benign prostatic hyperplasia) Flomax      Advance Care Planning:   Code Status: Full Code   Consults: General Surgery w/ Dr. Maia Plan   Family Communication: Daughter at the bedside   Severity of Illness: The appropriate patient status for this patient is INPATIENT. Inpatient status is judged to be reasonable and necessary in order to provide the required intensity of service to ensure the patient's safety. The patient's presenting symptoms, physical exam findings, and initial radiographic and laboratory data in the context of their chronic comorbidities is felt to place them at high risk for further clinical deterioration. Furthermore, it is not anticipated that the patient will be medically stable for discharge from the hospital within 2 midnights of admission.   * I certify that at the point of admission it is my clinical judgment that the patient will require inpatient hospital care spanning beyond 2 midnights from the point of admission due to high intensity of service, high risk for further deterioration and high frequency of surveillance required.*  Greater than 50% was spent in counseling and coordination of care with patient Total encounter time 80 minutes or more  Author: Floydene Flock, MD 10/20/2022 11:42 AM  For on call review [email or web address].

## 2022-10-20 NOTE — Assessment & Plan Note (Signed)
BP stable Titrate home regimen 

## 2022-10-20 NOTE — Assessment & Plan Note (Signed)
Noted lower abdominal pain x 2 to 3 days with diverticulitis with perforation on CT scan Dr. Maia Plan with general surgery managing Follow-up

## 2022-10-20 NOTE — Anesthesia Postprocedure Evaluation (Signed)
Anesthesia Post Note  Patient: Joe Torres  Procedure(s) Performed: EXPLORATORY LAPAROTOMY,  SIGMOID COLECTOMY, LOOP ILEOSTOMY (Abdomen)  Patient location during evaluation: PACU Anesthesia Type: General Level of consciousness: awake and alert Pain management: pain level controlled Vital Signs Assessment: post-procedure vital signs reviewed and stable Respiratory status: spontaneous breathing, nonlabored ventilation, respiratory function stable and patient connected to nasal cannula oxygen Cardiovascular status: blood pressure returned to baseline and stable Postop Assessment: no apparent nausea or vomiting Anesthetic complications: no  No notable events documented.   Last Vitals:  Vitals:   10/20/22 2020 10/20/22 2127  BP: 118/77   Pulse: 97   Resp: 20   Temp: 36.7 C   SpO2: 95% 95%    Last Pain:  Vitals:   10/20/22 2018  PainSc: 0-No pain                 Stephanie Coup

## 2022-10-21 ENCOUNTER — Encounter: Payer: Self-pay | Admitting: General Surgery

## 2022-10-21 DIAGNOSIS — K5720 Diverticulitis of large intestine with perforation and abscess without bleeding: Secondary | ICD-10-CM | POA: Diagnosis not present

## 2022-10-21 LAB — COMPREHENSIVE METABOLIC PANEL
ALT: 26 U/L (ref 0–44)
AST: 20 U/L (ref 15–41)
Albumin: 3.6 g/dL (ref 3.5–5.0)
Alkaline Phosphatase: 51 U/L (ref 38–126)
Anion gap: 13 (ref 5–15)
BUN: 17 mg/dL (ref 8–23)
CO2: 27 mmol/L (ref 22–32)
Calcium: 8.6 mg/dL — ABNORMAL LOW (ref 8.9–10.3)
Chloride: 98 mmol/L (ref 98–111)
Creatinine, Ser: 0.91 mg/dL (ref 0.61–1.24)
GFR, Estimated: >60 mL/min (ref >60)
Glucose, Bld: 156 mg/dL — ABNORMAL HIGH (ref 70–99)
Potassium: 3.5 mmol/L (ref 3.5–5.1)
Sodium: 138 mmol/L (ref 135–145)
Total Bilirubin: 1.1 mg/dL (ref 0.3–1.2)
Total Protein: 6.7 g/dL (ref 6.5–8.1)

## 2022-10-21 LAB — CBC
HCT: 41.8 % (ref 39.0–52.0)
Hemoglobin: 13.7 g/dL (ref 13.0–17.0)
MCH: 29.6 pg (ref 26.0–34.0)
MCHC: 32.8 g/dL (ref 30.0–36.0)
MCV: 90.3 fL (ref 80.0–100.0)
Platelets: 342 10*3/uL (ref 150–400)
RBC: 4.63 MIL/uL (ref 4.22–5.81)
RDW: 13.2 % (ref 11.5–15.5)
WBC: 16.8 10*3/uL — ABNORMAL HIGH (ref 4.0–10.5)
nRBC: 0 % (ref 0.0–0.2)

## 2022-10-21 MED ORDER — PANTOPRAZOLE SODIUM 40 MG IV SOLR
40.0000 mg | Freq: Once | INTRAVENOUS | Status: AC
  Administered 2022-10-21: 40 mg via INTRAVENOUS
  Filled 2022-10-21: qty 10

## 2022-10-21 MED ORDER — ALBUTEROL SULFATE HFA 108 (90 BASE) MCG/ACT IN AERS
2.0000 | INHALATION_SPRAY | RESPIRATORY_TRACT | Status: DC | PRN
  Administered 2022-10-24 – 2022-11-05 (×9): 2 via RESPIRATORY_TRACT
  Filled 2022-10-21: qty 6.7

## 2022-10-21 NOTE — Evaluation (Signed)
Occupational Therapy Evaluation Patient Details Name: Joe Torres MRN: [id number] DOB: 04-22-1946 Today's Date: 10/21/2022   History of Present Illness Joe Torres is a 77 year old male with a past medical history of asthma/COPD overlap, who presented to the hospital with abdominal pain. Being treated for diverticulitis of colon with perforation.   Clinical Impression   Pt was seen for OT evaluation this date. Prior to hospital admission, pt was MOD I with BADLs, with wife assisting as needed. Pt lives with wife and has multiple family members available to help as needed. Pt presents to acute OT demonstrating impaired ADL performance and functional mobility 2/2 decreased activity tolerance, balance, increased pain (See OT problem list for additional functional deficits). Pt used 3L [place] at baseline. Pt was on 3L during session with SpO2 maintaining in the mid 90's. Audible exertion while breathing noted throughout session, which pt's spouse reports is his normal during strenuous tasks. Pt currently requires MIN A for sit<>stand, hand held assist while transferring due to unsteady gait. CGA during sink side ADLS. Pt would benefit from skilled OT services to address noted impairments and functional limitations (see below for any additional details) in order to maximize safety and independence while minimizing falls risk and caregiver burden. Do not anticipate the need for follow up OT services upon acute hospital DC.      Recommendations for follow up therapy are one component of a multi-disciplinary discharge planning process, led by the attending physician.  Recommendations may be updated based on patient status, additional functional criteria and insurance authorization.   Assistance Recommended at Discharge Set up Supervision/Assistance  Patient can return home with the following A little help with walking and/or transfers;A little help with bathing/dressing/bathroom;Help with stairs or ramp for  entrance    Functional Status Assessment  Patient has had a recent decline in their functional status and demonstrates the ability to make significant improvements in function in a reasonable and predictable amount of time.  Equipment Recommendations  None recommended by OT    Recommendations for Other Services       Precautions / Restrictions Precautions Precautions: Fall Restrictions Weight Bearing Restrictions: No      Mobility Bed Mobility Overal bed mobility: Modified Independent             General bed mobility comments: extra time provided    Transfers Overall transfer level: Needs assistance Equipment used: 1 person hand held assist Transfers: Sit to/from Stand Sit to Stand: Min assist           General transfer comment: Pt grabbed onto IV pole and counter for stability.      Balance Overall balance assessment: Needs assistance Sitting-balance support: No upper extremity supported, Feet supported Sitting balance-Leahy Scale: Good     Standing balance support: Single extremity supported, Reliant on assistive device for balance Standing balance-Leahy Scale: Fair                             ADL either performed or assessed with clinical judgement   ADL Overall ADL's : Needs assistance/impaired     Grooming: Oral care;Min guard Grooming Details (indicate cue type and reason): Pt brushed teeth and washed hands while standing at sink. Pt leaned on forearms while completing tasks to maintain balance and ease pain.  Functional mobility during ADLs: Min guard (Held onto IV pole for stability while walking to sink)       Vision         Perception     Praxis      Pertinent Vitals/Pain Pain Assessment Pain Assessment: 0-10 Pain Score: 6  Pain Location: Abdomin - surgical sight Pain Descriptors / Indicators: Discomfort, Tender Pain Intervention(s): Monitored during session, Relaxation (Pt  reported sitting EOB eased pain)     Hand Dominance     Extremity/Trunk Assessment Upper Extremity Assessment Upper Extremity Assessment: Overall WFL for tasks assessed   Lower Extremity Assessment Lower Extremity Assessment: Overall WFL for tasks assessed       Communication Communication Communication: Prefers language other than English (Spanish)   Cognition Arousal/Alertness: Awake/alert Behavior During Therapy: WFL for tasks assessed/performed Overall Cognitive Status: Within Functional Limits for tasks assessed                                 General Comments: Pleasant and willing to participate     General Comments       Exercises     Shoulder Instructions      Home Living Family/patient expects to be discharged to:: Private residence Living Arrangements: Spouse/significant other Available Help at Discharge: Family Type of Home: House Home Access: Stairs to enter Secretary/administrator of Steps: 5   Home Layout: One level     Bathroom Shower/Tub: Engineer, production Accessibility: Yes How Accessible: Accessible via walker Home Equipment: Rolling Walker (2 wheels);Cane - single point;Shower seat          Prior Functioning/Environment Prior Level of Function : Needs assist       Physical Assist : Mobility (physical);ADLs (physical)   ADLs (physical): Bathing;Dressing   ADLs Comments: Pt obtains assistance from wife with ADLs due to frequnt bouts of SOB.        OT Problem List: Decreased activity tolerance;Impaired balance (sitting and/or standing);Pain      OT Treatment/Interventions: Self-care/ADL training;Therapeutic exercise;Energy conservation;DME and/or AE instruction    OT Goals(Current goals can be found in the care plan section) Acute Rehab OT Goals Patient Stated Goal: To go home OT Goal Formulation: With patient Time For Goal Achievement: 11/04/22 Potential to Achieve Goals: Good ADL Goals Pt Will  Perform Grooming: standing;Independently Pt Will Perform Lower Body Dressing: Independently;sit to/from stand Pt Will Transfer to Toilet: Independently;ambulating;regular height toilet  OT Frequency: Min 1X/week    Co-evaluation              AM-PAC OT "6 Clicks" Daily Activity     Outcome Measure Help from another person eating meals?: None Help from another person taking care of personal grooming?: A Little Help from another person toileting, which includes using toliet, bedpan, or urinal?: A Little Help from another person bathing (including washing, rinsing, drying)?: A Little Help from another person to put on and taking off regular upper body clothing?: None Help from another person to put on and taking off regular lower body clothing?: A Little 6 Click Score: 20   End of Session Equipment Utilized During Treatment: Oxygen  Activity Tolerance: Patient limited by fatigue Patient left: in bed;with call bell/phone within reach;with bed alarm set;with family/visitor present  OT Visit Diagnosis: Unsteadiness on feet (R26.81);Muscle weakness (generalized) (M62.81);Pain Pain - part of body:  (Abdomin)  Time: 1610-9604 OT Time Calculation (min): 23 min Charges:  OT General Charges $OT Visit: 1 Visit OT Evaluation $OT Eval Moderate Complexity: 1 Mod OT Treatments $Self Care/Home Management : 8-22 mins Thresa Ross, OTS

## 2022-10-21 NOTE — Progress Notes (Signed)
Patient ID: MALEKO GREULICH, male   DOB: 03/03/1946, 77 y.o.   MRN: [id number]     SURGICAL PROGRESS NOTE   Hospital Day(s): 1.   Interval History: Patient seen and examined, no acute events or new complaints overnight. Patient reports having expected postoperative pain.  Denies any nausea or vomiting.  He remove the NGT last night.  Denies passing gas through the ileostomy yet.  Denies any chest pain or shortness of breath  Vital signs in last 24 hours: [min-max] current  Temp:  [98.1 F (36.7 C)-100 F (37.8 C)] 98.1 F (36.7 C) (06/10 0353) Pulse Rate:  [91-105] 105 (06/10 0353) Resp:  [13-24] 20 (06/10 0353) BP: (118-154)/(64-130) 142/73 (06/10 0353) SpO2:  [93 %-99 %] 96 % (06/10 0353) Weight:  [69.8 kg-69.9 kg] 69.8 kg (06/09 2035)     Height: 5\' 9"  (175.3 cm) Weight: 69.8 kg BMI (Calculated): 22.71   Physical Exam:  Constitutional: alert, cooperative and no distress  Respiratory: breathing non-labored at rest  Cardiovascular: regular rate and sinus rhythm  Gastrointestinal: soft, non-tender, and non-distended.  Ileostomy is pink and patent.  Labs:     Latest Ref Rng & Units 10/21/2022    4:41 AM 10/20/2022    8:26 AM 04/29/2022   10:02 AM  CBC  WBC 4.0 - 10.5 K/uL 16.8  12.4  7.2   Hemoglobin 13.0 - 17.0 g/dL 40.9  81.1  91.4   Hematocrit 39.0 - 52.0 % 41.8  44.0  42.5   Platelets 150 - 400 K/uL 342  348  261       Latest Ref Rng & Units 10/21/2022    4:41 AM 10/20/2022    8:35 AM 10/20/2022    8:26 AM  CMP  Glucose 70 - 99 mg/dL 782   956   BUN 8 - 23 mg/dL 17   19   Creatinine 2.13 - 1.24 mg/dL 0.86   5.78   Sodium 469 - 145 mmol/L 138   137   Potassium 3.5 - 5.1 mmol/L 3.5   3.9   Chloride 98 - 111 mmol/L 98   96   CO2 22 - 32 mmol/L 27   29   Calcium 8.9 - 10.3 mg/dL 8.6   9.0   Total Protein 6.5 - 8.1 g/dL 6.7  7.0    Total Bilirubin 0.3 - 1.2 mg/dL 1.1  1.0    Alkaline Phos 38 - 126 U/L 51  59    AST 15 - 41 U/L 20  23    ALT 0 - 44 U/L 26  29      Imaging  studies: No new pertinent imaging studies   Assessment/Plan:  77 y.o. male with perforated diverticulitis 1 Day Post-Op s/p partial colectomy with anastomosis and loop ileostomy creation, complicated by pertinent comorbidities including severe COPD, history of bladder cancer, hypertension.  -Doing well on postoperative day #1 -No sign of complications.  No clinical deterioration from the pulmonary standpoint.  I think that the pulmonary condition will be one of the atypical things to manage.  But so far doing good -He pulled NG last night.  Will hold for now.  If you develop any nausea or vomiting and develop postoperative ileus he will need to have the NGT back.  Will continue with liquid diet. -Will continue pain management -Will continue IV antibiotic therapy -Consult ostomy nurse for education and management of the ileostomy -Consult PT/OT  Gae Gallop, MD

## 2022-10-21 NOTE — Plan of Care (Signed)
Patient A&Ox4, from home, SBA in room. Pain partially controlled with medication, repositioning and rest. No significant changes this shift.

## 2022-10-21 NOTE — Progress Notes (Addendum)
Progress Note   Patient: Joe Torres ZOX:[id number] DOB: [birth year] DOA: 10/20/2022     1 DOS: the patient was seen and examined on 10/21/2022    Subjective:  Patient seen and examined at bedside this morning Was having some wheezing  He had chest finished working with physical therapist Nebulization ordered Denied chest pain nausea vomiting no abdominal pain   Brief hospital course: From HPI "Joe Torres is a 77 y.o. male with medical history significant of chronic respiratory failure on 2 to 3 L, COPD, GERD, hypertension, history of bladder cancer in remission presenting with abdominal pain, perforated diverticulitis, COPD exacerbation, acute on chronic respiratory failure hypoxia.  Patient ports increased work of breathing over the past 4 to 5 days.  Positive wheezing cough, increased sputum production.  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  Positive increased work of breathing despite inhaler use.  No reported sick contacts.  No fevers or chills.  No hemiparesis or confusion.  Patient also ports worsening lower abdominal pain over the past 3 days.  Mild nausea but no vomiting.  No reported diarrhea.  No reported recent dietary changes.  Prior surgical history includes transurethral resection of bladder tumor in 2022 as well as chemoradiation to the area.  Noted prior colonoscopy September 2022 with reported diverticulosis.  Also with hernia repair in 2017. Presented to the ER afebrile, hemodynamically stable, satting 9 9% on 3 L.  White count 12.4, hemoglobin 14.3, platelets 348, creatinine 1.11, urinalysis within normal limits.  CTA of the chest as well as CT of the abdomen pelvis obtained showing severe centrilobular emphysema concerning for COPD as well as small to moderate pneumoperitoneum with concern for perforated sigmoid diverticulitis without abscess.  Dr. Maia Plan with general surgery evaluating patient with plan for operative  repair today."  Assessment and Plan:  Diverticulitis of colon with perforation s/p surgery Noted lower abdominal pain x 2 to 3 days with diverticulitis with perforation on CT scan Patient underwent low anterior resection with primary anastomosis and diverting loop ileostomy creation by surgeon done on 10/20/2022 Plan of care discussed with surgeon We will continue postoperative care As needed pain medication Apparently patient pulled out his NG tube last night Surgeon has agreed for this to be held but we will put this back if there is concerns of ileitis Dr. Maia Plan with general surgery following    Acute on chronic respiratory failure with hypoxia (HCC) Acute decompensated respiratory failure in the setting of COPD exacerbation with cough wheezing, increased sputum reduction Baseline home O2 use around 2-3L Currently on 3 L of oxygen Patient uses 3 L at home Continue to wean down oxygen as tolerated Continue steroid therapy I personally reviewed patient's CT scan of the chest that showed findings concerning for severe emphysema Continue nebulization Continue Zosyn in the setting of diverticulitis coverage      Malignant neoplasm of overlapping sites of bladder [facility]) Noted prior history of high-grade urothelial carcinoma  s/p concurrent chemoradiation with 5-FU mitomycin ending in March 2022  No evidence of disease per 05/2022 Rad-onc note Continue to monitor closely-outpatient follow-up with oncologist   Hypertension BP stable Titrate home regimen   Hyperlipidemia Continue statin   GERD (gastroesophageal reflux disease) PPI   BPH (benign prostatic hyperplasia) Flomax       Advance Care Planning:   Code Status: Full Code    Consults: General Surgery w/ Dr. Maia Plan  Family Communication: Daughter at the bedside     Physical Exam: General: Noted to be in respiratory distress with accessory muscle use HENT:     Head: Normocephalic and atraumatic.     Nose:  Nose normal.     Mouth/Throat:     Mouth: Mucous membranes are moist.  Eyes:     Pupils: Pupils are equal, round, and reactive to light.  Cardiovascular:     Rate and Rhythm: Normal rate and regular rhythm.  Pulmonary: Wheezing noted bilaterally with increased work of breathing Abdominal: ileostomy minimal tenderness around midline incisional dressing site Musculoskeletal:        General: Normal range of motion.     Cervical back: Normal range of motion.  Skin:    General: Skin is warm.  Neurological:     General: No focal deficit present.     Data Reviewed: I have personally reviewed patient's laboratory results showing WBC increased from 12-16.8 in the setting of steroid use, sodium 138 potassium 3.5 creatinine 0.9   Patient is Hyrex we will continue to monitor closely  Vitals:   10/21/22 0148 10/21/22 0353 10/21/22 0750 10/21/22 0800  BP:  (!) 142/73 138/88   Pulse:  (!) 105 (!) 103   Resp:  20 20   Temp:  98.1 F (36.7 C) 99 F (37.2 C)   TempSrc:   Oral   SpO2: 95% 96% 96% 98%  Weight:      Height:         Author: Loyce Dys, MD 10/21/2022 1:54 PM  For on call review [email or web address].

## 2022-10-21 NOTE — TOC Initial Note (Signed)
Transition of Care [facility]) - Initial/Assessment Note    Patient Details  Name: Joe Torres MRN: [id number] Date of Birth: 04/28/1946  [facility] CM/SW Contact:    Marlowe Sax, RN Phone Number: 10/21/2022, 11:00 AM  Clinical Narrative:                  Patient from home  Surgery yesterday pulled out NG tube last night, they are holding for now, liquid diet, ostomy nurse consulted for teaching Not passing gas yet, TOC will continue to monitor for needs  Expected Discharge Plan: Home w Home Health Services Barriers to Discharge: Continued Medical Work up   Patient Goals and CMS Choice            Expected Discharge Plan and Services   Discharge Planning Services: CM Consult   Living arrangements for the past 2 months: Single Family Home                                      Prior Living Arrangements/Services Living arrangements for the past 2 months: Single Family Home     Do you feel safe going back to the place where you live?: Yes               Activities of Daily Living Home Assistive Devices/Equipment: None ADL Screening (condition at time of admission) Patient's cognitive ability adequate to safely complete daily activities?: Yes Is the patient deaf or have difficulty hearing?: No Does the patient have difficulty seeing, even when wearing glasses/contacts?: No Does the patient have difficulty concentrating, remembering, or making decisions?: No Patient able to express need for assistance with ADLs?: Yes Does the patient have difficulty dressing or bathing?: No Independently performs ADLs?: Yes (appropriate for developmental age) Does the patient have difficulty walking or climbing stairs?: No Weakness of Legs: None Weakness of Arms/Hands: None  Permission Sought/Granted                  Emotional Assessment              Admission diagnosis:  COPD exacerbation (HCC) [J44.1] Diverticulitis of colon with perforation  [K57.20] Perforated abdominal viscus [R19.8] Patient Active Problem List   Diagnosis Date Noted   Diverticulitis of colon with perforation 10/20/2022   Acute on chronic respiratory failure with hypoxia (HCC) 10/20/2022   Malignant neoplasm of overlapping sites of bladder (HCC) 06/18/2020   Goals of care, counseling/discussion 06/18/2020   Hyperlipidemia 05/27/2019   Hypertension 05/27/2019   OSA on CPAP 05/27/2019   Primary osteoarthritis of right knee 06/19/2018   Cataract 07/17/2015   Injury of conjunctiva and corneal abrasion of left eye without foreign body 06/28/2015   Hordeolum externum 03/30/2015   Right inguinal hernia 03/20/2015   Environmental allergies 03/17/2015   Skin lesion 11/02/2014   Lumbar strain, initial encounter 06/17/2014   Spondylosis of lumbar region without myelopathy or radiculopathy 06/17/2014   Impaired glucose tolerance 05/09/2014   Knee pain 05/09/2014   Carpal tunnel syndrome 03/28/2014   BPH (benign prostatic hyperplasia) 05/10/2013   GERD (gastroesophageal reflux disease) 05/10/2013   Chronic pain syndrome 03/10/2013   Asthma-COPD overlap syndrome 01/29/2013   Pulmonary nodule seen on imaging study 01/29/2013   Cortical senile cataract 12/04/2011   Tear film insufficiency 12/04/2011   Obstructive chronic bronchitis 07/31/2010   Onychomycosis 03/28/2009   Encounter for general adult medical examination without abnormal  findings 04/27/2008   Sciatica 12/30/2007   Back pain 12/16/2007   PCP:  [facility]:   [facility] [address] [place] - [address] [place] [address] Phone: [phone] Fax: [phone]  [phone] [place], [place] - [address] [place] [address] Phone: [phone] Fax: [phone]     Social Determinants of Health (SDOH) Social History: SDOH Screenings   Food Insecurity: No Food Insecurity (10/20/2022)  Housing:  Low Risk  (10/20/2022)  Transportation Needs: No Transportation Needs (10/20/2022)  Utilities: Not At Risk (10/20/2022)  Tobacco Use: Medium Risk (10/21/2022)   SDOH Interventions:     Readmission Risk Interventions     No data to display

## 2022-10-21 NOTE — Evaluation (Addendum)
Physical Therapy Evaluation Patient Details Name: JOHN WILLIAMSEN MRN: [id number] DOB: 10-Jul-1945 Today's Date: 10/21/2022  History of Present Illness  Mr. Waldman is a 77 year old male with a past medical history of asthma/COPD overlap, who presented to the hospital with abdominal pain. Being treated for diverticulitis of colon with perforation.   Clinical Impression  Patient alert, agreeable to PT with encouragement stated he had just gotten back in bed. At baseline the pt has assistance from his wife due to SOB for ADLs, denied DME use. He was able to perform supine <> sit modI and extra time, bed rails. Sit <> stand with CGA and RW (with encouragement) and ambulated ~66ft. Two standing rest breaks due to SOB. Pt on 3L throughout, spO2> 90% HR in 110s-120s with activity. Returned to supine with needs in reach. The patient would benefit from further skilled PT intervention to continue to progress towards goals. Recommendation remains appropriate.   Spanish interpretor utilized throughout session.        Recommendations for follow up therapy are one component of a multi-disciplinary discharge planning process, led by the attending physician.  Recommendations may be updated based on patient status, additional functional criteria and insurance authorization.  Follow Up Recommendations       Assistance Recommended at Discharge Set up Supervision/Assistance  Patient can return home with the following  A little help with walking and/or transfers;Assistance with cooking/housework;Assist for transportation;Help with stairs or ramp for entrance;A little help with bathing/dressing/bathroom    Equipment Recommendations  (rollator but pt indicated he does not want any DME)  Recommendations for Other Services       Functional Status Assessment Patient has had a recent decline in their functional status and demonstrates the ability to make significant improvements in function in a reasonable and  predictable amount of time.     Precautions / Restrictions Precautions Precautions: Fall Restrictions Weight Bearing Restrictions: No      Mobility  Bed Mobility Overal bed mobility: Modified Independent                  Transfers Overall transfer level: Needs assistance Equipment used: Rolling walker (2 wheels) Transfers: Sit to/from Stand Sit to Stand: Min guard                Ambulation/Gait Ambulation/Gait assistance: Supervision Gait Distance (Feet): 30 Feet Assistive device: Rolling walker (2 wheels)         General Gait Details: limited by SOB, 2 standing rest breaks  Stairs            Wheelchair Mobility    Modified Rankin (Stroke Patients Only)       Balance Overall balance assessment: Needs assistance Sitting-balance support: No upper extremity supported, Feet supported Sitting balance-Leahy Scale: Good     Standing balance support: Single extremity supported, Reliant on assistive device for balance Standing balance-Leahy Scale: Fair                               Pertinent Vitals/Pain Pain Assessment Pain Assessment: No/denies pain    Home Living Family/patient expects to be discharged to:: Private residence Living Arrangements: Spouse/significant other Available Help at Discharge: Family Type of Home: House Home Access: Stairs to enter   Secretary/administrator of Steps: 5   Home Layout: One level Home Equipment: Agricultural consultant (2 wheels);Cane - single point;Shower seat      Prior Function Prior Level of Function :  Needs assist       Physical Assist : Mobility (physical);ADLs (physical)   ADLs (physical): Bathing;Dressing   ADLs Comments: Pt obtains assistance from wife with ADLs due to frequnt bouts of SOB.     Hand Dominance        Extremity/Trunk Assessment   Upper Extremity Assessment Upper Extremity Assessment: Overall WFL for tasks assessed    Lower Extremity Assessment Lower  Extremity Assessment: Overall WFL for tasks assessed       Communication   Communication: Prefers language other than English (Spanish)  Cognition Arousal/Alertness: Awake/alert Behavior During Therapy: WFL for tasks assessed/performed Overall Cognitive Status: Within Functional Limits for tasks assessed                                          General Comments      Exercises     Assessment/Plan    PT Assessment Patient needs continued PT services  PT Problem List Decreased mobility;Decreased activity tolerance;Decreased balance       PT Treatment Interventions DME instruction;Therapeutic activities;Therapeutic exercise;Gait training;Patient/family education;Stair training;Balance training;Functional mobility training;Neuromuscular re-education    PT Goals (Current goals can be found in the Care Plan section)  Acute Rehab PT Goals Patient Stated Goal: to go home PT Goal Formulation: With patient Time For Goal Achievement: 11/04/22 Potential to Achieve Goals: Good    Frequency Min 2X/week     Co-evaluation               AM-PAC PT "6 Clicks" Mobility  Outcome Measure Help needed turning from your back to your side while in a flat bed without using bedrails?: None Help needed moving from lying on your back to sitting on the side of a flat bed without using bedrails?: None Help needed moving to and from a bed to a chair (including a wheelchair)?: None Help needed standing up from a chair using your arms (e.g., wheelchair or bedside chair)?: None Help needed to walk in hospital room?: None Help needed climbing 3-5 steps with a railing? : None 6 Click Score: 24    End of Session   Activity Tolerance: Patient tolerated treatment well Patient left: with call bell/phone within reach;in bed Nurse Communication: Mobility status PT Visit Diagnosis: Other abnormalities of gait and mobility (R26.89);Muscle weakness (generalized) (M62.81)    Time:  1610-9604 PT Time Calculation (min) (ACUTE ONLY): 24 min   Charges:   PT Evaluation $PT Eval Low Complexity: 1 Low PT Treatments $Therapeutic Activity: 8-22 mins        Olga Coaster PT, DPT 3:15 PM,10/21/22

## 2022-10-22 ENCOUNTER — Encounter: Payer: Self-pay | Admitting: Urology

## 2022-10-22 ENCOUNTER — Other Ambulatory Visit: Payer: Medicare HMO | Admitting: Urology

## 2022-10-22 DIAGNOSIS — K5720 Diverticulitis of large intestine with perforation and abscess without bleeding: Secondary | ICD-10-CM | POA: Diagnosis not present

## 2022-10-22 LAB — CBC WITH DIFFERENTIAL/PLATELET
Abs Immature Granulocytes: 0.11 10*3/uL — ABNORMAL HIGH (ref 0.00–0.07)
Basophils Absolute: 0 10*3/uL (ref 0.0–0.1)
Basophils Relative: 0 %
Eosinophils Absolute: 0 10*3/uL (ref 0.0–0.5)
Eosinophils Relative: 0 %
HCT: 42.1 % (ref 39.0–52.0)
Hemoglobin: 13.6 g/dL (ref 13.0–17.0)
Immature Granulocytes: 1 %
Lymphocytes Relative: 2 %
Lymphs Abs: 0.4 10*3/uL — ABNORMAL LOW (ref 0.7–4.0)
MCH: 29.6 pg (ref 26.0–34.0)
MCHC: 32.3 g/dL (ref 30.0–36.0)
MCV: 91.7 fL (ref 80.0–100.0)
Monocytes Absolute: 1.1 10*3/uL — ABNORMAL HIGH (ref 0.1–1.0)
Monocytes Relative: 6 %
Neutro Abs: 14.9 10*3/uL — ABNORMAL HIGH (ref 1.7–7.7)
Neutrophils Relative %: 91 %
Platelets: 365 10*3/uL (ref 150–400)
RBC: 4.59 MIL/uL (ref 4.22–5.81)
RDW: 13.4 % (ref 11.5–15.5)
WBC: 16.5 10*3/uL — ABNORMAL HIGH (ref 4.0–10.5)
nRBC: 0 % (ref 0.0–0.2)

## 2022-10-22 LAB — BASIC METABOLIC PANEL
Anion gap: 9 (ref 5–15)
BUN: 22 mg/dL (ref 8–23)
CO2: 31 mmol/L (ref 22–32)
Calcium: 8.8 mg/dL — ABNORMAL LOW (ref 8.9–10.3)
Chloride: 100 mmol/L (ref 98–111)
Creatinine, Ser: 1.01 mg/dL (ref 0.61–1.24)
GFR, Estimated: >60 mL/min (ref >60)
Glucose, Bld: 139 mg/dL — ABNORMAL HIGH (ref 70–99)
Potassium: 3.7 mmol/L (ref 3.5–5.1)
Sodium: 140 mmol/L (ref 135–145)

## 2022-10-22 MED ORDER — PANTOPRAZOLE SODIUM 40 MG IV SOLR: 40.0000 mg | Freq: Every day | INTRAVENOUS | Status: DC

## 2022-10-22 MED ORDER — PANTOPRAZOLE SODIUM 40 MG IV SOLR
40.0000 mg | Freq: Every day | INTRAVENOUS | Status: DC
  Administered 2022-10-22 – 2022-10-23 (×2): 40 mg via INTRAVENOUS
  Filled 2022-10-22 (×2): qty 10

## 2022-10-22 NOTE — Plan of Care (Signed)
Patient A&Ox4, from home, SBA in room. IV abx and ileostomy maintained. No significant changes this shift.

## 2022-10-22 NOTE — Progress Notes (Signed)
Spoke with patient's son regarding patient's POC at d/c. Patient's son states he believes patient does not fully understand the importance of the need for [facility] nursing to help care for his new ileostomy. Sending message to SW/CM as well regarding this.

## 2022-10-22 NOTE — Consult Note (Addendum)
WOC Nurse ostomy consult note; patient underwent low anterior resection 10/20/2022 with diverting loop ileostomy by Dr. Hazle Quant  Stoma type/location: RLQ loop ileostomy  Stomal assessment/size: 1 3/4" slight oval, pink moist well budded, clear stabilization catheter  in place  Peristomal assessment: patient has 3 areas of MARSI noted from skin barrier 2 cm x 2 cm at 1 o'clock, 1 cm x 1 cm at 10 o'clock and 2 cm x 2 cm at 8 o'clock Treatment options for stomal/peristomal skin: 2" skin barrier ring, cut strips of Mepitel to cover areas of MARSI and covered the Mepitel with Tegaderm dressings  Output approximately 50 mls bloody effluent  Ostomy pouching: 2 piece 2 3/4" skin barrier [place] #2), 2 3/4" pouch [place] [phone]) and 2" skin barrier ring [place] [phone])  Education provided: Patient and wife both engaged in education. Did utilize spanish interpreter for entire session.  All education materials were printed out in Spanish for patient and wife as well.   Educated patient and wife on emptying pouch when 1/3 to 1/2 full and changing entire pouching system twice weekly or if leakage occurs.  Demonstrated how to burp pouch as pouch was full of gas when this RN arrived to visit.  Wife was able to empty the pouch utilizing lock and roll mechanism including cleaning spout with toilet paper wick.  We removed current pouch to assess stoma. Patient noted to have 3 areas of MARSI as noted above and support rod in place.  Did tell patient and wife that surgeon would remove support rod in their office usually 7-14 days after surgery so working around rod will only be temporary.  Discussed cleaning around stoma with water moistened washcloth only.  This RN did treat areas of MARSI as above.  Demonstrated sizing of stoma and discussed how the size and shape of stoma may change as the edema in his abdomen and stoma decreases.  Recommended sizing stoma with each pouch change for the next month. Stoma measured at 1 3/4"  today.  Showed patient and wife the back of skin barrier ring but discussed that we would not cut the barrier round as the stoma itself is oval.  I cut the skin barrier ring to fit stoma. Pattern left in room  Demonstrated placing 2" barrier ring snugly around stoma and then placed skin barrier on top of barrier ring.  Had wife close new pouch using lock and roll.  Wife was then able to attach pouch to new skin barrier.   At this visit we did discuss that ileostomies can be high output of liquid stool and how to prevent dehydration.  Also discussed showering with pouch on and off.  Discussed emptying pouch in toilet when at home.  Reviewed step by step instructions on how to change a 2 piece ostomy pouching system printed out in Spanish.  Patient and wife asked appropriate questions and wife was able to provide hands on care.    Enrolled patient in [employer] DC program: Yes today   Supplies placed in room today by WOC nurse: (5) 2 3/4" skin barriers, (5) 2 3/4" pouch, (5) 2" skin barrier rings and 1 stoma powder   WOC team will continue to follow patient for ostomy teaching and support.   Thank you,    Priscella Mann MSN, RN-BC, [employer] [phone]

## 2022-10-22 NOTE — TOC Progression Note (Signed)
Transition of Care [facility]) - Progression Note    Patient Details  Name: Joe Torres MRN: [id number] Date of Birth: Sep 17, 1945  Transition of Care [facility]) CM/SW Contact  Marlowe Sax, RN Phone Number: 10/22/2022, 2:14 PM  Clinical Narrative:    Met with the patient in the room and using the electronic interpreter for Spanish discussed DC plan and needs He tells me that he lives at home with his wife and is independent at home He does not want HH nursing he stated He stated that he does not want PT either He has oxygen at home with [employer] and has a portable tank that his family will bring to dc.   TOC to continue to follow for DC plan and needs  Expected Discharge Plan: Home/Self Care Barriers to Discharge: Continued Medical Work up  Expected Discharge Plan and Services   Discharge Planning Services: CM Consult   Living arrangements for the past 2 months: Single Family Home                                       Social Determinants of Health (SDOH) Interventions SDOH Screenings   Food Insecurity: No Food Insecurity (10/20/2022)  Housing: Low Risk  (10/20/2022)  Transportation Needs: No Transportation Needs (10/20/2022)  Utilities: Not At Risk (10/20/2022)  Tobacco Use: Medium Risk (10/21/2022)    Readmission Risk Interventions     No data to display

## 2022-10-22 NOTE — Progress Notes (Signed)
Progress Note   Patient: Joe Torres AOZ:[id number] DOB: 11-22-1945 DOA: 10/20/2022     2 DOS: the patient was seen and examined on 10/22/2022     Subjective:  Patient seen at bedside this morning Wheezing but improving mostly noted in the expiratory phase Denies worsening shortness of breath cough abdominal pain     Brief hospital course: From HPI "Joe Torres is a 77 y.o. male with medical history significant of chronic respiratory failure on 2 to 3 L, COPD, GERD, hypertension, history of bladder cancer in remission presenting with abdominal pain, perforated diverticulitis, COPD exacerbation, acute on chronic respiratory failure hypoxia.  Patient ports increased work of breathing over the past 4 to 5 days.  Positive wheezing cough, increased sputum production.  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  Positive increased work of breathing despite inhaler use.  No reported sick contacts.  No fevers or chills.  No hemiparesis or confusion.  Patient also ports worsening lower abdominal pain over the past 3 days.  Mild nausea but no vomiting.  No reported diarrhea.  No reported recent dietary changes.  Prior surgical history includes transurethral resection of bladder tumor in 2022 as well as chemoradiation to the area.  Noted prior colonoscopy September 2022 with reported diverticulosis.  Also with hernia repair in 2017. Presented to the ER afebrile, hemodynamically stable, satting 9 9% on 3 L.  White count 12.4, hemoglobin 14.3, platelets 348, creatinine 1.11, urinalysis within normal limits.  CTA of the chest as well as CT of the abdomen pelvis obtained showing severe centrilobular emphysema concerning for COPD as well as small to moderate pneumoperitoneum with concern for perforated sigmoid diverticulitis without abscess.  Dr. Maia Plan with general surgery evaluating patient with plan for operative repair today."   Assessment and Plan:    Diverticulitis of colon with perforation s/p surgery Noted lower abdominal pain x 2 to 3 days with diverticulitis with perforation on CT scan Patient underwent low anterior resection with primary anastomosis and diverting loop ileostomy creation by surgeon done on 10/20/2022 Plan of care discussed with surgery team We will continue postoperative care Continue pain medication as needed Advancement of diet according to surgical recommendation Dr. Maia Plan with general surgery following     Acute on chronic respiratory failure with hypoxia (HCC) Acute decompensated respiratory failure in the setting of COPD exacerbation with cough wheezing, increased sputum reduction Baseline home O2 use around 2-3L Currently on 3 L of oxygen Patient uses 3 L at home Continue to wean down oxygen as tolerated Complete 5 days course of prednisone 40 mg I personally reviewed patient's CT scan of the chest that showed findings concerning for severe emphysema Continue nebulization Continue Zosyn in the setting of diverticulitis coverage       Malignant neoplasm of overlapping sites of bladder [facility]) Noted prior history of high-grade urothelial carcinoma  s/p concurrent chemoradiation with 5-FU mitomycin ending in March 2022  No evidence of disease per 05/2022 Rad-onc note Continue to monitor closely-outpatient follow-up with oncologist   Hypertension BP stable Titrate home regimen   Hyperlipidemia Continue statin   GERD (gastroesophageal reflux disease) PPI   BPH (benign prostatic hyperplasia) Flomax        Advance Care Planning:   Code Status: Full Code    Consults: General Surgery w/ Dr. Maia Plan    Family Communication: Daughter at the bedside        Physical Exam:  General: In mild to moderate distress requiring intranasal oxygen HENT: Normocephalic atraumatic Eyes:     Pupils: Pupils are equal, round, and reactive to light.  Cardiovascular:     Rate and Rhythm: Normal rate and  regular rhythm.  Pulmonary: Wheezing noted bilaterally with increased work of breathing Abdominal: Ileostomy in place with tenderness around ileostomy site and surgical incisional site Musculoskeletal:        General: Normal range of motion.     Cervical back: Normal range of motion.  Skin:    General: Skin is warm.  Neurological:     General: No focal deficit present.        Data Reviewed: Labs personally reviewed by me today showed sodium 140 potassium 3.7 glucose 139 creatinine 1.0 WBC 16.5 hemoglobin 13.6     Patient continues to look very high risk for morbidity and mortality on account of underlying severe emphysema      Vitals:   10/22/22 0457 10/22/22 0725 10/22/22 0730 10/22/22 1532  BP:   125/89 (!) 144/89  Pulse:   (!) 105 100  Resp:   20 18  Temp:   98 F (36.7 C) 98.2 F (36.8 C)  TempSrc:   Oral   SpO2: 95% 98% 99% 97%  Weight:      Height:         Author: Loyce Dys, MD 10/22/2022 5:46 PM  For on call review [email or web address].

## 2022-10-22 NOTE — Progress Notes (Signed)
Patient ID: Joe Torres, male   DOB: 03/19/1946, 77 y.o.   MRN: [id number]     SURGICAL PROGRESS NOTE   Hospital Day(s): 2.   Interval History: Patient seen and examined, no acute events or new complaints overnight. Patient reports feeling better this morning.  He endorses the pain continues to improve slowly.  No clinical deterioration.  No nausea or vomiting.  Tolerating liquid diet.  He had significant amount of gas in the ileostomy bag.  No stool yet.  Vital signs in last 24 hours: [min-max] current  Temp:  [98 F (36.7 C)-98.3 F (36.8 C)] 98 F (36.7 C) (06/11 0730) Pulse Rate:  [95-109] 105 (06/11 0730) Resp:  [16-20] 20 (06/11 0730) BP: (125-155)/(88-91) 125/89 (06/11 0730) SpO2:  [94 %-99 %] 99 % (06/11 0730)     Height: 5\' 9"  (175.3 cm) Weight: 69.8 kg BMI (Calculated): 22.71   Physical Exam:  Constitutional: alert, cooperative and no distress  Respiratory: breathing non-labored at rest  Cardiovascular: regular rate and sinus rhythm  Gastrointestinal: soft, non-tender, and mildly-distended.  Ileostomy is pink and patent with gas in bag.  Labs:     Latest Ref Rng & Units 10/22/2022    4:38 AM 10/21/2022    4:41 AM 10/20/2022    8:26 AM  CBC  WBC 4.0 - 10.5 K/uL 16.5  16.8  12.4   Hemoglobin 13.0 - 17.0 g/dL 40.9  81.1  91.4   Hematocrit 39.0 - 52.0 % 42.1  41.8  44.0   Platelets 150 - 400 K/uL 365  342  348       Latest Ref Rng & Units 10/22/2022    4:38 AM 10/21/2022    4:41 AM 10/20/2022    8:35 AM  CMP  Glucose 70 - 99 mg/dL 782  956    BUN 8 - 23 mg/dL 22  17    Creatinine 2.13 - 1.24 mg/dL 0.86  5.78    Sodium 469 - 145 mmol/L 140  138    Potassium 3.5 - 5.1 mmol/L 3.7  3.5    Chloride 98 - 111 mmol/L 100  98    CO2 22 - 32 mmol/L 31  27    Calcium 8.9 - 10.3 mg/dL 8.8  8.6    Total Protein 6.5 - 8.1 g/dL  6.7  7.0   Total Bilirubin 0.3 - 1.2 mg/dL  1.1  1.0   Alkaline Phos 38 - 126 U/L  51  59   AST 15 - 41 U/L  20  23   ALT 0 - 44 U/L  26  29     Imaging  studies: No new pertinent imaging studies   Assessment/Plan:  77 y.o. male with perforated diverticulitis 2 Day Post-Op s/p partial colectomy with anastomosis and loop ileostomy creation, complicated by pertinent comorbidities including severe COPD, history of bladder cancer, hypertension.   -Patient continue with adequate progress -Today with some gas in the ileostomy bag.  Will advance diet to full liquids. -Appreciate hospitalist and pulmonology evaluation and recommendation of his difficult COPD -Appreciate ostomy nurse evaluation and recommendations -Continue IV antibiotic therapy -Continue pain management -Encouraged the patient to ambulate  Gae Gallop, MD

## 2022-10-22 NOTE — Plan of Care (Signed)

## 2022-10-23 DIAGNOSIS — K5720 Diverticulitis of large intestine with perforation and abscess without bleeding: Secondary | ICD-10-CM | POA: Diagnosis not present

## 2022-10-23 LAB — CBC WITH DIFFERENTIAL/PLATELET
Abs Immature Granulocytes: 0.08 10*3/uL — ABNORMAL HIGH (ref 0.00–0.07)
Basophils Absolute: 0 10*3/uL (ref 0.0–0.1)
Basophils Relative: 0 %
Eosinophils Absolute: 0 10*3/uL (ref 0.0–0.5)
Eosinophils Relative: 0 %
HCT: 40.1 % (ref 39.0–52.0)
Hemoglobin: 12.8 g/dL — ABNORMAL LOW (ref 13.0–17.0)
Immature Granulocytes: 1 %
Lymphocytes Relative: 4 %
Lymphs Abs: 0.5 10*3/uL — ABNORMAL LOW (ref 0.7–4.0)
MCH: 29.6 pg (ref 26.0–34.0)
MCHC: 31.9 g/dL (ref 30.0–36.0)
MCV: 92.6 fL (ref 80.0–100.0)
Monocytes Absolute: 1.2 10*3/uL — ABNORMAL HIGH (ref 0.1–1.0)
Monocytes Relative: 10 %
Neutro Abs: 10.4 10*3/uL — ABNORMAL HIGH (ref 1.7–7.7)
Neutrophils Relative %: 85 %
Platelets: 355 10*3/uL (ref 150–400)
RBC: 4.33 MIL/uL (ref 4.22–5.81)
RDW: 13.3 % (ref 11.5–15.5)
WBC: 12.2 10*3/uL — ABNORMAL HIGH (ref 4.0–10.5)
nRBC: 0 % (ref 0.0–0.2)

## 2022-10-23 LAB — BASIC METABOLIC PANEL
Anion gap: 9 (ref 5–15)
BUN: 21 mg/dL (ref 8–23)
CO2: 30 mmol/L (ref 22–32)
Calcium: 8.9 mg/dL (ref 8.9–10.3)
Chloride: 98 mmol/L (ref 98–111)
Creatinine, Ser: 0.88 mg/dL (ref 0.61–1.24)
GFR, Estimated: >60 mL/min (ref >60)
Glucose, Bld: 125 mg/dL — ABNORMAL HIGH (ref 70–99)
Potassium: 3.4 mmol/L — ABNORMAL LOW (ref 3.5–5.1)
Sodium: 137 mmol/L (ref 135–145)

## 2022-10-23 MED ORDER — POTASSIUM CHLORIDE CRYS ER 20 MEQ PO TBCR
40.0000 meq | EXTENDED_RELEASE_TABLET | Freq: Once | ORAL | Status: AC
  Administered 2022-10-23: 40 meq via ORAL
  Filled 2022-10-23: qty 2

## 2022-10-23 MED ORDER — PANTOPRAZOLE SODIUM 40 MG IV SOLR
40.0000 mg | Freq: Two times a day (BID) | INTRAVENOUS | Status: DC
  Administered 2022-10-23 – 2022-10-25 (×4): 40 mg via INTRAVENOUS
  Filled 2022-10-23 (×4): qty 10

## 2022-10-23 MED ORDER — OXYCODONE HCL 5 MG PO TABS
5.0000 mg | ORAL_TABLET | ORAL | Status: DC | PRN
  Administered 2022-10-23: 5 mg via ORAL
  Administered 2022-10-24 – 2022-10-25 (×6): 10 mg via ORAL
  Filled 2022-10-23 (×6): qty 2
  Filled 2022-10-23: qty 1

## 2022-10-23 MED ORDER — MORPHINE SULFATE (PF) 4 MG/ML IV SOLN
4.0000 mg | INTRAVENOUS | Status: DC | PRN
  Administered 2022-10-23 – 2022-11-07 (×40): 4 mg via INTRAVENOUS
  Filled 2022-10-23 (×45): qty 1

## 2022-10-23 MED ORDER — IPRATROPIUM-ALBUTEROL 0.5-2.5 (3) MG/3ML IN SOLN
3.0000 mL | Freq: Four times a day (QID) | RESPIRATORY_TRACT | Status: DC
  Administered 2022-10-23 – 2022-10-24 (×5): 3 mL via RESPIRATORY_TRACT
  Filled 2022-10-23 (×5): qty 3

## 2022-10-23 MED ORDER — ACETAMINOPHEN 500 MG PO TABS
1000.0000 mg | ORAL_TABLET | Freq: Three times a day (TID) | ORAL | Status: DC
  Administered 2022-10-23 – 2022-11-01 (×15): 1000 mg via ORAL
  Filled 2022-10-23 (×20): qty 2

## 2022-10-23 MED ORDER — ALUM & MAG HYDROXIDE-SIMETH 200-200-20 MG/5ML PO SUSP
30.0000 mL | Freq: Four times a day (QID) | ORAL | Status: DC
  Administered 2022-10-23 – 2022-10-24 (×3): 30 mL via ORAL
  Filled 2022-10-23 (×3): qty 30

## 2022-10-23 NOTE — TOC Progression Note (Addendum)
Transition of Care [facility]) - Progression Note    Patient Details  Name: Joe Torres MRN: [id number] Date of Birth: [birth year]  Transition of Care [facility]) CM/SW Contact  Marlowe Sax, RN Phone Number: 10/23/2022, 9:19 AM  Clinical Narrative:    The patient is now agreeable to get [facility] RN for new ostomy.  Sent the referral to [place] at [facility] , they accepted the patient with a start of care on Monday 6/17. He requested a rolling walker, [employer] to deliver to the bedside   Expected Discharge Plan: Home/Self Care Barriers to Discharge: Continued Medical Work up  Expected Discharge Plan and Services   Discharge Planning Services: CM Consult   Living arrangements for the past 2 months: Single Family Home                                       Social Determinants of Health (SDOH) Interventions SDOH Screenings   Food Insecurity: No Food Insecurity (10/20/2022)  Housing: Low Risk  (10/20/2022)  Transportation Needs: No Transportation Needs (10/20/2022)  Utilities: Not At Risk (10/20/2022)  Tobacco Use: Medium Risk (10/21/2022)    Readmission Risk Interventions     No data to display

## 2022-10-23 NOTE — Plan of Care (Signed)
  Problem: Education: Goal: Knowledge of disease or condition will improve Outcome: Progressing   Problem: Activity: Goal: Ability to tolerate increased activity will improve Outcome: Progressing   Problem: Respiratory: Goal: Ability to maintain a clear airway will improve Outcome: Progressing   Problem: Education: Goal: Knowledge of General Education information will improve Description: Including pain rating scale, medication(s)/side effects and non-pharmacologic comfort measures Outcome: Progressing   

## 2022-10-23 NOTE — Progress Notes (Signed)
Patient ID: Joe Torres, male   DOB: 08-08-1945, 77 y.o.   MRN: [id number]     SURGICAL PROGRESS NOTE   Hospital Day(s): 3.   Interval History: Patient seen and examined, no acute events or new complaints overnight. Patient reports feeling well.  He denies any significant pain.  Pain controlled with current medication.  He denies any nausea or vomiting.  He has stool in the ileostomy bag.  Vital signs in last 24 hours: [min-max] current  Temp:  [98 F (36.7 C)-98.3 F (36.8 C)] 98.3 F (36.8 C) (06/12 0720) Pulse Rate:  [92-100] 95 (06/12 0720) Resp:  [18-20] 20 (06/12 0720) BP: (129-144)/(89-99) 142/89 (06/12 0720) SpO2:  [96 %-98 %] 97 % (06/12 0748)     Height: 5\' 9"  (175.3 cm) Weight: 69.8 kg BMI (Calculated): 22.71   Physical Exam:  Constitutional: alert, cooperative and no distress  Respiratory: breathing non-labored at rest  Cardiovascular: regular rate and sinus rhythm  Gastrointestinal: soft, non-tender, and non-distended.  Ileostomy is pink and patent  Labs:     Latest Ref Rng & Units 10/23/2022    4:39 AM 10/22/2022    4:38 AM 10/21/2022    4:41 AM  CBC  WBC 4.0 - 10.5 K/uL 12.2  16.5  16.8   Hemoglobin 13.0 - 17.0 g/dL 86.5  78.4  69.6   Hematocrit 39.0 - 52.0 % 40.1  42.1  41.8   Platelets 150 - 400 K/uL 355  365  342       Latest Ref Rng & Units 10/23/2022    4:39 AM 10/22/2022    4:38 AM 10/21/2022    4:41 AM  CMP  Glucose 70 - 99 mg/dL 295  284  132   BUN 8 - 23 mg/dL 21  22  17    Creatinine 0.61 - 1.24 mg/dL 4.40  1.02  7.25   Sodium 135 - 145 mmol/L 137  140  138   Potassium 3.5 - 5.1 mmol/L 3.4  3.7  3.5   Chloride 98 - 111 mmol/L 98  100  98   CO2 22 - 32 mmol/L 30  31  27    Calcium 8.9 - 10.3 mg/dL 8.9  8.8  8.6   Total Protein 6.5 - 8.1 g/dL   6.7   Total Bilirubin 0.3 - 1.2 mg/dL   1.1   Alkaline Phos 38 - 126 U/L   51   AST 15 - 41 U/L   20   ALT 0 - 44 U/L   26     Imaging studies: No new pertinent imaging studies   Assessment/Plan:  77  y.o. male with perforated diverticulitis 3 Day Post-Op s/p partial colectomy with anastomosis and loop ileostomy creation, complicated by pertinent comorbidities including severe COPD, history of bladder cancer, hypertension.   -Patient continue adequate progress after partial colectomy. -Good ileostomy output today -Will advance diet to soft diet -Will start discharge planning.  Patient needs assistant with ostomy care. -Continue IV antibiotic therapy -Encourage patient to ambulate -Continue pain management  Gae Gallop, MD

## 2022-10-23 NOTE — Consult Note (Signed)
WOC in to visit patient and assess pouching from 10/22/2022. Pouch intact, no leaks over night or at present.  Stool noted in pouch.  Using interpretor did discuss with patient that it would be beneficial for him to have home health at discharge to assist with further education and support of ostomy.  Patient agreed. Also stated he wanted a rolling walker.  Secure chat sent to [facility] regarding above.  I did have patients wife empty pouch at this time which she was able to do independently.  Patient states his daughters are coming today and he would like education to be provided to them. I left a voice mail message for both daughters to set up a time for education.  Also asked bedside nurse via secure chat to notify me if daughters arrive in room.    WOC team will continue to follow patient for ostomy education and support.   Thank you ,    Priscella Mann MSN, RN-BC, [employer] [phone]

## 2022-10-23 NOTE — Care Management Important Message (Signed)
Important Message  Patient Details  Name: Joe Torres MRN: [id number] Date of Birth: 1945/06/30   Medicare Important Message Given:  N/A - LOS <3 / Initial given by admissions     Olegario Messier A Jasim Harari 10/23/2022, 9:07 AM

## 2022-10-23 NOTE — Progress Notes (Signed)
Physical Therapy Treatment Patient Details Name: Joe Torres MRN: [id number] DOB: [birth year] Today's Date: 10/23/2022   History of Present Illness Joe. Mention is a 77 year old male with a past medical history of asthma/COPD overlap, who presented to the hospital with abdominal pain. Being treated for diverticulitis of colon with perforation.    PT Comments    Pt was long sitting in bed upon arrival. He is alert with supportive family member at bedside. Pt is on 3 L o2 at baseline. Remained on 3 L o2 throughout session with lowest sao2 reading 94%. Pt is severely decondition/limited by SOB. His HR peaked during gait training at 133 bpm. Prolonged recovery required after ambulation ~ 100 ft. Joe Torres will continue to benefit from skilled PT at DC to maximize independence and safety while improving activity tolerance.     Recommendations for follow up therapy are one component of a multi-disciplinary discharge planning process, led by the attending physician.  Recommendations may be updated based on patient status, additional functional criteria and insurance authorization.     Assistance Recommended at Discharge Set up Supervision/Assistance  Patient can return home with the following A little help with walking and/or transfers;Assistance with cooking/housework;Assist for transportation;Help with stairs or ramp for entrance;A little help with bathing/dressing/bathroom   Equipment Recommendations  Other (comment) (Pt endorsed having all equipment nedds met. will need to confirm RW + home o2)       Precautions / Restrictions Precautions Precautions: Fall Restrictions Weight Bearing Restrictions: No     Mobility  Bed Mobility Overal bed mobility: Modified Independent   Transfers Overall transfer level: Needs assistance Equipment used: Rolling walker (2 wheels) Transfers: Sit to/from Stand Sit to Stand: Supervision    Ambulation/Gait Ambulation/Gait assistance: Supervision Gait Distance  (Feet): 100 Feet Assistive device: Rolling walker (2 wheels) Gait Pattern/deviations: Step-through pattern Gait velocity: decreased  General Gait Details: Pt was able to ambulate ~ 100 ft with RW however distance limited by SOB/fatigue. HR elevated to 133bpm at max. Prolonged recovery required after ambulation on 3 L. sao2 > 94% on 3 L.     Balance Overall balance assessment: Needs assistance Sitting-balance support: No upper extremity supported, Feet supported Sitting balance-Leahy Scale: Good     Standing balance support: Bilateral upper extremity supported, Reliant on assistive device for balance, During functional activity Standing balance-Leahy Scale: Fair         Cognition Arousal/Alertness: Awake/alert Behavior During Therapy: WFL for tasks assessed/performed Overall Cognitive Status: Within Functional Limits for tasks assessed    General Comments: Pleasant and willing to participate           General Comments General comments (skin integrity, edema, etc.): Pt is severely limited by activity tolerance. Demonstrates good strength without LOB .      Pertinent Vitals/Pain Pain Assessment Pain Assessment: No/denies pain Pain Score: 0-No pain     PT Goals (current goals can now be found in the care plan section) Acute Rehab PT Goals Patient Stated Goal: to go home Progress towards PT goals: Progressing toward goals    Frequency    Min 2X/week      PT Plan Current plan remains appropriate       AM-PAC PT "6 Clicks" Mobility   Outcome Measure  Help needed turning from your back to your side while in a flat bed without using bedrails?: None Help needed moving from lying on your back to sitting on the side of a flat bed without using bedrails?: None Help  needed moving to and from a bed to a chair (including a wheelchair)?: None Help needed standing up from a chair using your arms (e.g., wheelchair or bedside chair)?: None Help needed to walk in hospital  room?: None Help needed climbing 3-5 steps with a railing? : A Little 6 Click Score: 23    End of Session Equipment Utilized During Treatment: Oxygen (3 L o2 throughout) Activity Tolerance: Patient limited by fatigue Patient left: in bed;with call bell/phone within reach;with family/visitor present;with nursing/sitter in room Nurse Communication: Mobility status PT Visit Diagnosis: Other abnormalities of gait and mobility (R26.89);Muscle weakness (generalized) (M62.81)     Time: 1610-9604 PT Time Calculation (min) (ACUTE ONLY): 10 min  Charges:  $Gait Training: 8-22 mins                     Jetta Lout PTA 10/23/22, 4:09 PM

## 2022-10-23 NOTE — Progress Notes (Signed)
Progress Note   Patient: Joe Torres JWJ:[id number] DOB: 1945-06-14 DOA: 10/20/2022     3 DOS: the patient was seen and examined on 10/23/2022     Subjective:  Patient seen at bedside this morning Wheezing but improving mostly noted in the expiratory phase Denies worsening shortness of breath cough abdominal pain     Brief hospital course: From HPI "[name] is a 77 y.o. male with medical history significant of chronic respiratory failure on 2 to 3 L, COPD, GERD, hypertension, history of bladder cancer in remission presenting with abdominal pain, perforated diverticulitis, COPD exacerbation, acute on chronic respiratory failure hypoxia.  Patient ports increased work of breathing over the past 4 to 5 days.  Positive wheezing cough, increased sputum production.  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  Positive increased work of breathing despite inhaler use.  No reported sick contacts.  No fevers or chills.  No hemiparesis or confusion.  Patient also ports worsening lower abdominal pain over the past 3 days.  Mild nausea but no vomiting.  No reported diarrhea.  No reported recent dietary changes.  Prior surgical history includes transurethral resection of bladder tumor in 2022 as well as chemoradiation to the area.  Noted prior colonoscopy September 2022 with reported diverticulosis.  Also with hernia repair in 2017. Presented to the ER afebrile, hemodynamically stable, satting 9 9% on 3 L.  White count 12.4, hemoglobin 14.3, platelets 348, creatinine 1.11, urinalysis within normal limits.  CTA of the chest as well as CT of the abdomen pelvis obtained showing severe centrilobular emphysema concerning for COPD as well as small to moderate pneumoperitoneum with concern for perforated sigmoid diverticulitis without abscess.  Dr. Maia Plan with general surgery evaluating patient with plan for operative repair today."   Assessment and Plan:    Diverticulitis of colon with perforation s/p surgery Noted lower abdominal pain x 2 to 3 days with diverticulitis with perforation on CT scan Patient underwent low anterior resection with primary anastomosis and diverting loop ileostomy creation by surgeon done on 10/20/2022 --Dr. Maia Plan, general surgery following - see their recs --continue postoperative supportive care --ostomy teaching / training for family and patient --Continue pain medication as needed -- adjust regimen so IV morphine not needed for adequate relief (Changed to scheduled Tylenol and PRN oxycodone) --Advancement of diet according to surgical recommendation -- soft diet today   Acute on chronic respiratory failure with hypoxia (HCC) Acute decompensated respiratory failure in the setting of COPD exacerbation with cough wheezing, increased sputum reduction Baseline home O2 use around 2-3L.  Stable on 3 L/min here. Complete 5 days course of prednisone 40 mg CT scan chest showed severe emphysema Continue neb treatments, Daliresp Taper prednisone down to chronic 10 mg daily dose Continue Zosyn in the setting of complicated diverticulitis Add simethicone for gaseous distention  Hypokalemia - K 3.4 today. PO replacement ordered --Monitor BMP, check Mg level   Malignant neoplasm of overlapping sites of bladder [facility]) Noted prior history of high-grade urothelial carcinoma  s/p concurrent chemoradiation with 5-FU mitomycin ending in March 2022  No evidence of disease per 05/2022 Rad-onc note Continue to monitor closely-outpatient follow-up with oncologist   Hypertension BP stable Continue amlodipine, HCTZ, losartan   Hyperlipidemia Continue statin   GERD (gastroesophageal reflux disease) PPI - currently on IV Protonix daily -- increase to BID given significant symptomatic reflux Maalox PRN   BPH (benign prostatic hyperplasia) Flomax  Advance Care Planning:   Code Status: Full Code    Consults: General  Surgery w/ Dr. Maia Plan    Family Communication: Wife at bedside on AM rounds.  Two daughters at the bedside this afternoon.       Physical Exam:  General exam: awake, alert, no acute distress HEENT: moist mucus membranes, hearing grossly normal  Respiratory system: CTAB, no wheezes, rales or rhonchi, normal respiratory effort. Cardiovascular system: normal S1/S2, RRR,  no pedal edema.   Gastrointestinal system: distended abdomen, appropriate post-op tenderness, ileostomy bag present. Central nervous system: A&O x 3. no gross focal neurologic deficits, normal speech Extremities: moves all, no edema, normal tone Skin: dry, intact, normal temperature Psychiatry: normal mood, congruent affect, judgement and insight appear normal'        Data Reviewed:  Notable labs --- K 3.4, glucose 125, WBC improved 16.5 >> 12.2k     Patient continues to be high risk for morbidity and mortality on account of underlying severe emphysema.      Vitals:   10/22/22 2347 10/23/22 0720 10/23/22 0748 10/23/22 1530  BP: (!) 129/99 (!) 142/89  130/81  Pulse: 92 95  (!) 108  Resp: 18 20  19   Temp: 98 F (36.7 C) 98.3 F (36.8 C)  98.2 F (36.8 C)  TempSrc:      SpO2: 98% 97% 97% 96%  Weight:      Height:         Author: Pennie Banter, DO 10/23/2022 3:37 PM  For on call review [email or web address].

## 2022-10-24 DIAGNOSIS — K5720 Diverticulitis of large intestine with perforation and abscess without bleeding: Secondary | ICD-10-CM | POA: Diagnosis not present

## 2022-10-24 LAB — CBC WITH DIFFERENTIAL/PLATELET
Abs Immature Granulocytes: 0.15 10*3/uL — ABNORMAL HIGH (ref 0.00–0.07)
Basophils Absolute: 0 10*3/uL (ref 0.0–0.1)
Basophils Relative: 0 %
Eosinophils Absolute: 0 10*3/uL (ref 0.0–0.5)
Eosinophils Relative: 0 %
HCT: 41 % (ref 39.0–52.0)
Hemoglobin: 13.5 g/dL (ref 13.0–17.0)
Immature Granulocytes: 1 %
Lymphocytes Relative: 3 %
Lymphs Abs: 0.4 10*3/uL — ABNORMAL LOW (ref 0.7–4.0)
MCH: 30.1 pg (ref 26.0–34.0)
MCHC: 32.9 g/dL (ref 30.0–36.0)
MCV: 91.3 fL (ref 80.0–100.0)
Monocytes Absolute: 1.3 10*3/uL — ABNORMAL HIGH (ref 0.1–1.0)
Monocytes Relative: 10 %
Neutro Abs: 11 10*3/uL — ABNORMAL HIGH (ref 1.7–7.7)
Neutrophils Relative %: 86 %
Platelets: 401 10*3/uL — ABNORMAL HIGH (ref 150–400)
RBC: 4.49 MIL/uL (ref 4.22–5.81)
RDW: 13.3 % (ref 11.5–15.5)
WBC: 13 10*3/uL — ABNORMAL HIGH (ref 4.0–10.5)
nRBC: 0 % (ref 0.0–0.2)

## 2022-10-24 LAB — BASIC METABOLIC PANEL
Anion gap: 11 (ref 5–15)
BUN: 27 mg/dL — ABNORMAL HIGH (ref 8–23)
CO2: 30 mmol/L (ref 22–32)
Calcium: 9.2 mg/dL (ref 8.9–10.3)
Chloride: 95 mmol/L — ABNORMAL LOW (ref 98–111)
Creatinine, Ser: 1.06 mg/dL (ref 0.61–1.24)
GFR, Estimated: >60 mL/min (ref >60)
Glucose, Bld: 131 mg/dL — ABNORMAL HIGH (ref 70–99)
Potassium: 3.7 mmol/L (ref 3.5–5.1)
Sodium: 136 mmol/L (ref 135–145)

## 2022-10-24 LAB — MAGNESIUM: Magnesium: 2.5 mg/dL — ABNORMAL HIGH (ref 1.7–2.4)

## 2022-10-24 MED ORDER — SIMETHICONE 80 MG PO CHEW
80.0000 mg | CHEWABLE_TABLET | Freq: Four times a day (QID) | ORAL | Status: DC
  Administered 2022-10-24 – 2022-10-27 (×8): 80 mg via ORAL
  Filled 2022-10-24 (×14): qty 1

## 2022-10-24 MED ORDER — FINASTERIDE 5 MG PO TABS
5.0000 mg | ORAL_TABLET | Freq: Every day | ORAL | Status: DC
  Administered 2022-10-24 – 2022-11-07 (×12): 5 mg via ORAL
  Filled 2022-10-24 (×13): qty 1

## 2022-10-24 MED ORDER — IPRATROPIUM-ALBUTEROL 0.5-2.5 (3) MG/3ML IN SOLN
3.0000 mL | Freq: Three times a day (TID) | RESPIRATORY_TRACT | Status: DC
  Administered 2022-10-24 – 2022-10-27 (×8): 3 mL via RESPIRATORY_TRACT
  Filled 2022-10-24 (×8): qty 3

## 2022-10-24 MED ORDER — PREDNISONE 10 MG PO TABS
10.0000 mg | ORAL_TABLET | Freq: Every day | ORAL | Status: DC
  Administered 2022-10-25 – 2022-10-26 (×2): 10 mg via ORAL
  Filled 2022-10-24 (×2): qty 1

## 2022-10-24 NOTE — Evaluation (Signed)
Occupational Therapy Evaluation Patient Details Name: Joe Torres MRN: [id number] DOB: 08/25/1945 Today's Date: 10/24/2022   History of Present Illness Joe Torres is a 77 year old male with a past medical history of asthma/COPD overlap, who presented to the hospital with abdominal pain. Being treated for diverticulitis of colon with perforation.   Clinical Impression   Upon entering the room, pt supine in bed with wife present and visitor in room interpreting during session. Pt reports feeling fatigued and not as well since recently eating lunch but agreeable to therapeutic intervention. OT offering several different activities but pt declined exercises and self care tasks. Pt requesting to walk if possible. Pt remains on 3Ls O2 via [place] (baseline) and utilizes RW for energy conservation and safety. Pt's wife walks next to him providing close supervision while OT manages IV pole and oxygen. Pt ambulating 100' this session with supervision before returning to room secondary to fatigue. All items returned to proper location and pt utilizing urinal as therapist exits the room. Pt continues to benefit from OT intervention.      Recommendations for follow up therapy are one component of a multi-disciplinary discharge planning process, led by the attending physician.  Recommendations may be updated based on patient status, additional functional criteria and insurance authorization.   Assistance Recommended at Discharge Set up Supervision/Assistance  Patient can return home with the following A little help with walking and/or transfers;A little help with bathing/dressing/bathroom;Help with stairs or ramp for entrance;Assistance with cooking/housework;Assist for transportation       Equipment Recommendations  None recommended by OT       Precautions / Restrictions Precautions Precautions: Fall      Mobility Bed Mobility Overal bed mobility: Modified Independent             General bed  mobility comments: increased time and effort but no physical assistance    Transfers Overall transfer level: Needs assistance Equipment used: Rolling walker (2 wheels) Transfers: Sit to/from Stand Sit to Stand: Supervision                  Balance Overall balance assessment: Needs assistance Sitting-balance support: No upper extremity supported, Feet supported Sitting balance-Leahy Scale: Good     Standing balance support: Bilateral upper extremity supported, Reliant on assistive device for balance, During functional activity Standing balance-Leahy Scale: Fair                             ADL either performed or assessed with clinical judgement      Vision Patient Visual Report: No change from baseline              Pertinent Vitals/Pain Pain Assessment Pain Assessment: Faces Faces Pain Scale: Hurts a little bit Pain Location: Abdomin - surgical sight Pain Descriptors / Indicators: Discomfort Pain Intervention(s): Monitored during session, Repositioned     Hand Dominance     Extremity/Trunk Assessment Upper Extremity Assessment Upper Extremity Assessment: Overall WFL for tasks assessed   Lower Extremity Assessment Lower Extremity Assessment: Overall WFL for tasks assessed          Cognition Arousal/Alertness: Awake/alert Behavior During Therapy: WFL for tasks assessed/performed Overall Cognitive Status: Within Functional Limits for tasks assessed                                 General Comments: Pleasant and willing to participate  OT Frequency: Min 1X/week       AM-PAC OT "6 Clicks" Daily Activity     Outcome Measure Help from another person eating meals?: None Help from another person taking care of personal grooming?: None Help from another person toileting, which includes using toliet, bedpan, or urinal?: A Little Help from another person bathing (including washing, rinsing, drying)?: A  Little Help from another person to put on and taking off regular upper body clothing?: None Help from another person to put on and taking off regular lower body clothing?: A Little 6 Click Score: 21   End of Session Equipment Utilized During Treatment: Oxygen (3Ls)  Activity Tolerance: Patient limited by fatigue Patient left: in bed;with call bell/phone within reach;with family/visitor present  OT Visit Diagnosis: Unsteadiness on feet (R26.81);Muscle weakness (generalized) (M62.81);Pain                Time: 1445-1458 OT Time Calculation (min): 13 min Charges:  OT General Charges $OT Visit: 1 Visit OT Treatments $Therapeutic Activity: 8-22 mins  Joe Denmark, MS, OTR/L , CBIS ascom [phone]  10/24/22, 3:14 PM

## 2022-10-24 NOTE — TOC Progression Note (Signed)
Transition of Care [facility]) - Progression Note    Patient Details  Name: DAYSEAN TINKHAM MRN: [id number] Date of Birth: Oct 02, 1945  Transition of Care [facility]) CM/SW Contact  Marlowe Sax, RN Phone Number: 10/24/2022, 9:58 AM  Clinical Narrative:     [facility] notified me that the [facility] can be Friday 6/14   Expected Discharge Plan: Home w Home Health Services Barriers to Discharge: Continued Medical Work up  Expected Discharge Plan and Services   Discharge Planning Services: CM Consult   Living arrangements for the past 2 months: Single Family Home                 DME Arranged: Dan Humphreys rolling DME Agency: [employer] Date DME Agency Contacted: 10/23/22 Time DME Agency Contacted: [phone] Representative spoke with at DME Agency: Barbara Cower HH Arranged: PT, RN [facility] Agency: [facility] Date [facility] Agency Contacted: 10/23/22 Time HH Agency Contacted: [phone] Representative spoke with at [facility] Agency: [place]   Social Determinants of Health (SDOH) Interventions SDOH Screenings   Food Insecurity: No Food Insecurity (10/20/2022)  Housing: Low Risk  (10/20/2022)  Transportation Needs: No Transportation Needs (10/20/2022)  Utilities: Not At Risk (10/20/2022)  Tobacco Use: Medium Risk (10/21/2022)    Readmission Risk Interventions     No data to display

## 2022-10-24 NOTE — Care Management Important Message (Signed)
Important Message  Patient Details  Name: Joe Torres MRN: [id number] Date of Birth: 27-Jul-1945   Medicare Important Message Given:  N/A - LOS <3 / Initial given by admissions     Olegario Messier A Ilsa Bonello 10/24/2022, 9:59 AM

## 2022-10-24 NOTE — Progress Notes (Signed)
PT Cancellation Note  Patient Details Name: Joe Torres MRN: [id number] DOB: 10/24/1945   Cancelled Treatment:     Pt had just returned to bed after working with OT, unable to tolerate further activity at this time. Will continue per POC next available date/time.   Jannet Askew 10/24/2022, 3:29 PM

## 2022-10-24 NOTE — Progress Notes (Signed)
Patient ID: Joe Torres, male   DOB: 25-Aug-1945, 77 y.o.   MRN: [id number]     SURGICAL PROGRESS NOTE   Hospital Day(s): 4.   Interval History: Patient seen and examined, no acute events or new complaints overnight. Patient reports having pain on the left side of the abdomen.  She denies any nausea or vomiting.  He does look more distended this morning.  Ileostomy having adequate output.  Vital signs in last 24 hours: [min-max] current  Temp:  [97.5 F (36.4 C)-98.2 F (36.8 C)] 97.5 F (36.4 C) (06/12 2317) Pulse Rate:  [101-108] 101 (06/12 2317) Resp:  [19-20] 20 (06/12 2317) BP: (129-130)/(81-98) 129/98 (06/12 2317) SpO2:  [96 %-99 %] 97 % (06/13 0154)     Height: 5\' 9"  (175.3 cm) Weight: 69.8 kg BMI (Calculated): 22.71   Physical Exam:  Constitutional: alert, cooperative and no distress  Respiratory: breathing non-labored at rest  Cardiovascular: regular rate and sinus rhythm  Gastrointestinal: soft, non-tender, but distended.  The wound is dry and clean.  The ileostomy is pink and patent.  Labs:     Latest Ref Rng & Units 10/24/2022    4:34 AM 10/23/2022    4:39 AM 10/22/2022    4:38 AM  CBC  WBC 4.0 - 10.5 K/uL 13.0  12.2  16.5   Hemoglobin 13.0 - 17.0 g/dL 78.2  95.6  21.3   Hematocrit 39.0 - 52.0 % 41.0  40.1  42.1   Platelets 150 - 400 K/uL 401  355  365       Latest Ref Rng & Units 10/24/2022    4:34 AM 10/23/2022    4:39 AM 10/22/2022    4:38 AM  CMP  Glucose 70 - 99 mg/dL 086  578  469   BUN 8 - 23 mg/dL 27  21  22    Creatinine 0.61 - 1.24 mg/dL 6.29  5.28  4.13   Sodium 135 - 145 mmol/L 136  137  140   Potassium 3.5 - 5.1 mmol/L 3.7  3.4  3.7   Chloride 98 - 111 mmol/L 95  98  100   CO2 22 - 32 mmol/L 30  30  31    Calcium 8.9 - 10.3 mg/dL 9.2  8.9  8.8     Imaging studies: No new pertinent imaging studies   Assessment/Plan:  77 y.o. male with perforated diverticulitis 4 Day Post-Op s/p partial colectomy with anastomosis and loop ileostomy creation,  complicated by pertinent comorbidities including severe COPD, history of bladder cancer, hypertension.   -Patient with mild tachycardia.  No fever -Today the abdomen is more distended but still having adequate ileostomy output. -Will continue with current management and current diet.  Recommend the patient to eat small amount more frequently -I discussed with the patient to try to avoid narcotics -Continue IV antibiotic therapy -Encouraged the patient to continue relation -Continue pain management -Continue breathing therapies  Gae Gallop, MD

## 2022-10-24 NOTE — Plan of Care (Signed)
  Problem: Education: Goal: Knowledge of disease or condition will improve Outcome: Progressing   Problem: Activity: Goal: Ability to tolerate increased activity will improve Outcome: Progressing   Problem: Respiratory: Goal: Ability to maintain a clear airway will improve Outcome: Progressing   Problem: Education: Goal: Knowledge of General Education information will improve Description: Including pain rating scale, medication(s)/side effects and non-pharmacologic comfort measures Outcome: Progressing   

## 2022-10-24 NOTE — Consult Note (Addendum)
Education session with daughters, patient and wife.  Daughters do not require spanish interpreter, fluent in both [place] and [place].  Reviewed nature of ileostomy with daughters including potential for more liquid output which can place patient at  risk for dehydration.  Went over material about high output ileostomy and when to contact the surgeon  Also discussed long acting medications and potential for food bolus.  Reviewed emptying pouch and cleaning with toilet paper wick utilizing lock and roll mechanism.  Discussed showering with pouch on or off if it is the day to change the pouching system.  Education provided on emptying pouch when 1/3 to 1/2 full and changing entire pouching system 2 times a week.  We did not remove the skin barrier at this visit since it was just placed 6/12 but I did remove the pouch and show them the stoma.  We discussed that the support rod that is in place will usually be removed in the surgeons office 7-10 days postop.  We discussed that as the patients edema subsides the size of the stoma will likely change and demonstrated how to size stoma with each pouch change for the first month.    I have printed out all educational materials in [place] and Spanish for patient and daughters.  Did show daughters the one page handout with step by step instructions on changing a 2 piece pouching system. Did also review educational videos they can watch on their smart phones.  Daughters plan to come back for pouch change on Friday 10/25/2022.  Also reviewed with daughters how to obtain supplies once discharged from the hospital. I explained the Secure Start program through [place] we set him up with and will mark an [employer] catalog with items needed for care of his ostomy at home.  Patient will be followed by [facility] at discharge as well.  Also provided information on Ostomy Clinic at [facility].   WOC team will remain available to patient and family for ostomy education and support.   Thank you,     Priscella Mann MSN, RN-BC, [employer] [phone]

## 2022-10-24 NOTE — Progress Notes (Signed)
Progress Note   Patient: Joe Torres ZOX:[id number] DOB: 11/10/1945 DOA: 10/20/2022     4 DOS: the patient was seen and examined on 10/24/2022     Subjective:  Patient seen at bedside this morning Wheezing but improving mostly noted in the expiratory phase Denies worsening shortness of breath cough abdominal pain     Brief hospital course: From HPI "Joe Torres is a 77 y.o. male with medical history significant of chronic respiratory failure on 2 to 3 L, COPD, GERD, hypertension, history of bladder cancer in remission presenting with abdominal pain, perforated diverticulitis, COPD exacerbation, acute on chronic respiratory failure hypoxia.  Patient ports increased work of breathing over the past 4 to 5 days.  Positive wheezing cough, increased sputum production.  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  Positive increased work of breathing despite inhaler use.  No reported sick contacts.  No fevers or chills.  No hemiparesis or confusion.  Patient also ports worsening lower abdominal pain over the past 3 days.  Mild nausea but no vomiting.  No reported diarrhea.  No reported recent dietary changes.  Prior surgical history includes transurethral resection of bladder tumor in 2022 as well as chemoradiation to the area.  Noted prior colonoscopy September 2022 with reported diverticulosis.  Also with hernia repair in 2017. Presented to the ER afebrile, hemodynamically stable, satting 9 9% on 3 L.  White count 12.4, hemoglobin 14.3, platelets 348, creatinine 1.11, urinalysis within normal limits.  CTA of the chest as well as CT of the abdomen pelvis obtained showing severe centrilobular emphysema concerning for COPD as well as small to moderate pneumoperitoneum with concern for perforated sigmoid diverticulitis without abscess.  Dr. Maia Plan with general surgery evaluating patient with plan for operative repair today."   Assessment and Plan:    Diverticulitis of colon with perforation s/p surgery Noted lower abdominal pain x 2 to 3 days with diverticulitis with perforation on CT scan Patient underwent low anterior resection with primary anastomosis and diverting loop ileostomy creation by surgeon done on 10/20/2022 --Dr. Maia Plan, general surgery following - see their recs --Continue IV Zosyn  --Postoperative supportive care, pain control --ostomy teaching / training is underway for family and patient --Continue pain medication as needed -- adjust regimen so IV morphine not needed for adequate relief (Changed to scheduled Tylenol and PRN oxycodone) --Advancement of diet according to surgical recommendation -- soft diet today --Simethicone for distention added   Acute on chronic respiratory failure with hypoxia (HCC) Acute decompensated respiratory failure in the setting of COPD exacerbation with cough wheezing, increased sputum reduction Baseline home O2 use around 2-3L.  Stable on 3 L/min here. Completed 5 days course of prednisone 40 mg CT scan chest showed severe emphysema Continue neb treatments, Daliresp Resume prednisone chronic dose 10 mg daily tomorrow  Hypokalemia - K 3.4 today. PO replacement ordered --Monitor BMP, check Mg level   Malignant neoplasm of overlapping sites of bladder [facility] Noted prior history of high-grade urothelial carcinoma  s/p concurrent chemoradiation with 5-FU mitomycin ending in March 2022  No evidence of disease per 05/2022 Rad-onc note Continue to monitor closely-outpatient follow-up with oncologist   Hypertension BP stable Continue amlodipine, HCTZ, losartan   Hyperlipidemia Continue statin   GERD (gastroesophageal reflux disease) PPI - currently on IV Protonix daily -- increase to BID given significant symptomatic reflux Stopping Maalox due to elevated Mg today (6/13)   BPH (benign  prostatic hyperplasia) 6/12-13: pt having issues voiding, weak stream, difficulty initiating stream,  sensation of incomplete emptying --Continue Flomax --Monitor post-void residuals by bladder scan --Will add Proscar        Advance Care Planning:   Code Status: Full Code    Consults: General Surgery w/ Dr. Maia Plan    Family Communication: Wife & daughter at bedside on AM rounds.        Physical Exam:  General exam: awake, alert, no acute distress HEENT: moist mucus membranes, hearing grossly normal  Respiratory system: CTAB, no wheezes, rales or rhonchi, normal respiratory effort. Cardiovascular system: normal S1/S2, RRR,  no pedal edema.   Gastrointestinal system: distended abdomen, appropriate post-op tenderness, ileostomy bag with stool present. Central nervous system: A&O x 3. no gross focal neurologic deficits, normal speech Extremities: moves all, no edema, normal tone Skin: dry, intact, normal temperature Psychiatry: normal mood, congruent affect, judgement and insight appear normal'        Data Reviewed:  Notable labs --- Cl 95, glucose 131, BUN 27, Mg 2.5, WBC 13.0, platelets 401k     Patient continues to be high risk for morbidity and mortality on account of underlying severe emphysema.      Vitals:   10/23/22 2317 10/23/22 2319 10/24/22 0154 10/24/22 0828  BP: (!) 129/98   (!) 149/89  Pulse: (!) 101   (!) 101  Resp: 20   19  Temp: (!) 97.5 F (36.4 C)   97.6 F (36.4 C)  TempSrc: Oral     SpO2: 99% 98% 97% 95%  Weight:      Height:         Author: Pennie Banter, DO 10/24/2022 3:05 PM  For on call review [email or web address].

## 2022-10-25 ENCOUNTER — Ambulatory Visit: Payer: Medicare HMO

## 2022-10-25 DIAGNOSIS — K5720 Diverticulitis of large intestine with perforation and abscess without bleeding: Secondary | ICD-10-CM | POA: Diagnosis not present

## 2022-10-25 LAB — CBC
HCT: 41.8 % (ref 39.0–52.0)
Hemoglobin: 13.5 g/dL (ref 13.0–17.0)
MCH: 29.7 pg (ref 26.0–34.0)
MCHC: 32.3 g/dL (ref 30.0–36.0)
MCV: 92.1 fL (ref 80.0–100.0)
Platelets: 408 10*3/uL — ABNORMAL HIGH (ref 150–400)
RBC: 4.54 MIL/uL (ref 4.22–5.81)
RDW: 13.2 % (ref 11.5–15.5)
WBC: 12.1 10*3/uL — ABNORMAL HIGH (ref 4.0–10.5)
nRBC: 0 % (ref 0.0–0.2)

## 2022-10-25 LAB — BASIC METABOLIC PANEL
Anion gap: 11 (ref 5–15)
BUN: 24 mg/dL — ABNORMAL HIGH (ref 8–23)
CO2: 34 mmol/L — ABNORMAL HIGH (ref 22–32)
Calcium: 9.6 mg/dL (ref 8.9–10.3)
Chloride: 93 mmol/L — ABNORMAL LOW (ref 98–111)
Creatinine, Ser: 0.98 mg/dL (ref 0.61–1.24)
GFR, Estimated: >60 mL/min (ref >60)
Glucose, Bld: 125 mg/dL — ABNORMAL HIGH (ref 70–99)
Potassium: 3.4 mmol/L — ABNORMAL LOW (ref 3.5–5.1)
Sodium: 138 mmol/L (ref 135–145)

## 2022-10-25 LAB — SURGICAL PATHOLOGY

## 2022-10-25 MED ORDER — PANTOPRAZOLE SODIUM 40 MG PO TBEC
40.0000 mg | DELAYED_RELEASE_TABLET | Freq: Two times a day (BID) | ORAL | Status: DC
  Administered 2022-10-25: 40 mg via ORAL
  Filled 2022-10-25: qty 1

## 2022-10-25 MED ORDER — POTASSIUM CHLORIDE CRYS ER 20 MEQ PO TBCR
40.0000 meq | EXTENDED_RELEASE_TABLET | Freq: Once | ORAL | Status: AC
  Administered 2022-10-25: 40 meq via ORAL
  Filled 2022-10-25: qty 2

## 2022-10-25 MED ORDER — HYDROMORPHONE HCL 2 MG PO TABS
2.0000 mg | ORAL_TABLET | ORAL | Status: DC | PRN
  Administered 2022-10-25 – 2022-10-28 (×3): 2 mg via ORAL
  Filled 2022-10-25: qty 2
  Filled 2022-10-25 (×5): qty 1

## 2022-10-25 NOTE — Consult Note (Signed)
WOC Nurse ostomy follow up Stoma type/location: RLQ loop ileostomy  Stomal assessment/size: 1 3/4" oval, pink moist edematous, clear stabilization catheter in place  Peristomal assessment: Patient with 3 areas of MARSI noted from skin barrier; the areas at 1 o'clock and 10 o'clock have improved, area at 8 o'clock same as note from 6/11 Treatment options for stomal/peristomal skin: 2" skin barrier, cut strips of Mepitel to cover areas of MARSI and covered with Tegaderm dressings  Output approximately 50 mls liquid stool  Ostomy pouching: 2 pc 2 3/4" skin barrier [place] #2), 2 3/4" pouch [place] [phone]) and 2" skin barrier ring [place] [phone])   Education provided: Daughters present in room for hands on education.  Reviewed with daughters emptying pouch when 1/3 to 1/2 full.  Wife does empty the pouch at present utilizing the lock and roll mechanism and cleaning spout with toilet paper wick.  Also reviewed changing entire pouching system twice weekly and as needed for leaking.  Demonstrated to daughters removing skin barrier with push pull method.  Pointed out areas of MARSI and reviewed current treatment. We also discussed clear stabilization catheter being removed by surgeon office usually 7-10 days postop.  Demonstrated cleaning around stoma with water moistened washcloth. Sized the stoma at 1 3/4" oval today, this RN did cut new skin barrier. Educated daughters that they can utilize pattern at home to cut new skin barrier.  Did encourage sizing of stoma with each pouch change for first month postop. We discussed that size of stoma may change as edema subsides.  Daughter stretched 2" skin barrier ring to fit around stoma.  This RN treated areas of MARSI as above.  I cut skin barrier and had daughter place skin barrier over ring.  Daughter attached new pouch and closed using lock and roll mechanism.   Enrolled patient in [place] Secure Start Discharge program: Yes on 6/11; also discussed ordering of  supplies with daughters.   WOC team will continue to follow patient for ostomy education and support.   Thank you    Priscella Mann MSN, RN-BC, [employer] [phone]

## 2022-10-25 NOTE — Plan of Care (Signed)

## 2022-10-25 NOTE — Progress Notes (Signed)
PHARMACIST - PHYSICIAN COMMUNICATION  DR:  Denton Lank  CONCERNING: IV to Oral Route Change Policy  RECOMMENDATION: This patient is receiving pantoprazole by the intravenous route.  Based on criteria approved by the Pharmacy and Therapeutics Committee, the intravenous medication(s) is/are being converted to the equivalent oral dose form(s).   DESCRIPTION: These criteria include: The patient is eating (either orally or via tube) and/or has been taking other orally administered medications for a least 24 hours The patient has no evidence of active gastrointestinal bleeding or impaired GI absorption (gastrectomy, short bowel, patient on TNA or NPO).  If you have questions about this conversion, please contact the Pharmacy Department  []   [phone] )  [facility] [x]   [phone] )  [facility] []   [phone] )  [facility] []   [phone] )  [facility] []   [phone] )  [facility]   Barrie Folk, [facility] 10/25/2022 11:54 AM

## 2022-10-25 NOTE — Care Management Important Message (Signed)
Important Message  Patient Details  Name: Joe Torres MRN: [id number] Date of Birth: November 04, 1945   Medicare Important Message Given:  Yes     Joe Torres 10/25/2022, 2:58 PM

## 2022-10-25 NOTE — Progress Notes (Signed)
Progress Note   Patient: Joe Torres FTD:[id number] DOB: January 01, 1946 DOA: 10/20/2022     5 DOS: the patient was seen and examined on 10/25/2022       Brief hospital course: From HPI "Joe Torres is a 77 y.o. male with medical history significant of chronic respiratory failure on 2 to 3 L, COPD, GERD, hypertension, history of bladder cancer in remission presenting with abdominal pain, perforated diverticulitis, COPD exacerbation, acute on chronic respiratory failure hypoxia.  Patient ports increased work of breathing over the past 4 to 5 days.  Positive wheezing cough, increased sputum production.  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  Positive increased work of breathing despite inhaler use.  No reported sick contacts.  No fevers or chills.  No hemiparesis or confusion.  Patient also ports worsening lower abdominal pain over the past 3 days.  Mild nausea but no vomiting.  No reported diarrhea.  No reported recent dietary changes.  Prior surgical history includes transurethral resection of bladder tumor in 2022 as well as chemoradiation to the area.  Noted prior colonoscopy September 2022 with reported diverticulosis.  Also with hernia repair in 2017. Presented to the ER afebrile, hemodynamically stable, satting 9 9% on 3 L.  White count 12.4, hemoglobin 14.3, platelets 348, creatinine 1.11, urinalysis within normal limits.  CTA of the chest as well as CT of the abdomen pelvis obtained showing severe centrilobular emphysema concerning for COPD as well as small to moderate pneumoperitoneum with concern for perforated sigmoid diverticulitis without abscess.  Dr. Maia Plan with general surgery evaluating patient with plan for operative repair today."    Subject / interval history - pt reports overall doing well.  Has ongoing abdominal pain, but meds help. However, since we changed from Norco to oxycodone a few days ago, he feels like after taking  oxycodone he becomes more short of breath and tight in his chest.  He reports urinary stream is much better, feels normal again, after adding proscar yesterday.   Assessment and Plan:   Diverticulitis of colon with perforation s/p surgery Noted lower abdominal pain x 2 to 3 days with diverticulitis with perforation on CT scan Patient underwent low anterior resection with primary anastomosis and diverting loop ileostomy creation by surgeon done on 10/20/2022 --Dr. Maia Plan, general surgery following - see their recs --Continue IV Zosyn  --Postoperative supportive care, pain control --ostomy teaching / training is underway for family and patient --Continue pain medication as needed -- adjust regimen so IV morphine not needed for adequate relief 6/14: change oxycodone to oral dilaudid (pt reports feeling more short of breath and tight in his chest after oxycodone) --Advancement of diet according to surgical recommendation -- soft diet today --Simethicone for distention added   Acute on chronic respiratory failure with hypoxia (HCC) Acute decompensated respiratory failure in the setting of COPD exacerbation with cough wheezing, increased sputum reduction Baseline home O2 use around 2-3L.  Stable on 3 L/min here. Completed 5 days course of prednisone 40 mg CT scan chest showed severe emphysema Continue neb treatments, Daliresp Continue prednisone chronic dose 10 mg daily   Hypokalemia - K 3.4 today. 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  s/p concurrent chemoradiation with 5-FU mitomycin ending in March 2022  No evidence of disease per 05/2022 Rad-onc note Continue to monitor  closely-outpatient follow-up with oncologist   Hypertension BP stable Continue amlodipine, HCTZ, losartan   Hyperlipidemia Continue statin   GERD (gastroesophageal reflux disease) PPI -  currently on IV Protonix daily -- increase to BID given significant symptomatic reflux Stopping Maalox due to elevated Mg today (6/13)   BPH (benign prostatic hyperplasia) 6/12-13: pt having issues voiding, weak stream, difficulty initiating stream, sensation of incomplete emptying --Continue Flomax --Monitor post-void residuals by bladder scan --Added Proscar 6/13 6/14: pt reports improved stream with voiding        Advance Care Planning:   Code Status: Full Code    Consults: General Surgery w/ Dr. Maia Plan    Family Communication: Wife & daughters at bedside on AM rounds.        Physical Exam:  General exam: awake, alert, no acute distress HEENT: moist mucus membranes, hearing grossly normal  Respiratory system: CTAB, no wheezes, rales or rhonchi, normal respiratory effort. Cardiovascular system: normal S1/S2, RRR,  no pedal edema.   Gastrointestinal system: distended abdomen, appropriate post-op tenderness, ileostomy bag with stool present. Central nervous system: A&O x 3. no gross focal neurologic deficits, normal speech Extremities: moves all, no edema, normal tone Skin: dry, intact, normal temperature Psychiatry: normal mood, congruent affect, judgement and insight appear normal'        Data Reviewed:  Notable labs --- K 3.4, Cl 93, bicarb 34, glucose 125, BUN 24, WBC 12.1, platelets 408k     Patient continues to be high risk for morbidity and mortality on account of underlying severe emphysema.      Vitals:   10/24/22 1954 10/24/22 2001 10/24/22 2343 10/25/22 0825  BP:   136/79 135/83  Pulse:  (!) 101 98 100  Resp:  19 18 19   Temp:   98 F (36.7 C) 98.1 F (36.7 C)  TempSrc:      SpO2: 96% 96% 97% 95%  Weight:      Height:         Author: Pennie Banter, DO 10/25/2022 2:06 PM  For on call review [email or web address].

## 2022-10-25 NOTE — Progress Notes (Signed)
Physical Therapy Treatment Patient Details Name: Joe Torres MRN: [id number] DOB: 29-Feb-1946 Today's Date: 10/25/2022   History of Present Illness Joe Torres is a 77 year old male with a past medical history of asthma/COPD overlap, who presented to the hospital with abdominal pain. Being treated for diverticulitis of colon with perforation.    PT Comments    Pt was long sitting in bed upon arrival. He is A and O x 4. On 3 L O2 with sao2 > 94%. Pt was easily and safely able to exit bed, stand, and ambulate.  Tolerated gait ~ 150 ft without physical assistance. Limited by SOB more so than O2 or HR. Pt is progressing however slowly. Recommend continue skilled PT to maximize activity tolerance and safety with all ADLs.    Recommendations for follow up therapy are one component of a multi-disciplinary discharge planning process, led by the attending physician.  Recommendations may be updated based on patient status, additional functional criteria and insurance authorization.     Assistance Recommended at Discharge Set up Supervision/Assistance  Patient can return home with the following A little help with walking and/or transfers;Assistance with cooking/housework;Assist for transportation;Help with stairs or ramp for entrance;A little help with bathing/dressing/bathroom   Equipment Recommendations  Other (comment)       Precautions / Restrictions Precautions Precautions: Fall Precaution Comments: O2 Restrictions Weight Bearing Restrictions: No     Mobility  Bed Mobility Overal bed mobility: Modified Independent   Transfers Overall transfer level: Needs assistance Equipment used: Rolling walker (2 wheels) Transfers: Sit to/from Stand Sit to Stand: Supervision    Ambulation/Gait Ambulation/Gait assistance: Supervision Gait Distance (Feet): 150 Feet Assistive device: Rolling walker (2 wheels) Gait Pattern/deviations: Step-through pattern Gait velocity: decreased General Gait  Details: Pt on 3 L o2 but placed on 4 L during gait. sao2 > 94%. pt less SOB however still has some SOB noted   Balance Overall balance assessment: Needs assistance Sitting-balance support: No upper extremity supported, Feet supported Sitting balance-Leahy Scale: Good Standing balance support: Bilateral upper extremity supported, Reliant on assistive device for balance, During functional activity Standing balance-Leahy Scale: Fair    Cognition Arousal/Alertness: Awake/alert Behavior During Therapy: WFL for tasks assessed/performed Overall Cognitive Status: Within Functional Limits for tasks assessed  General Comments: Pleasant and willing to participate               Pertinent Vitals/Pain Pain Assessment Pain Assessment: 0-10 Pain Score: 0-No pain     PT Goals (current goals can now be found in the care plan section) Acute Rehab PT Goals Patient Stated Goal: to go home Progress towards PT goals: Progressing toward goals    Frequency    Min 2X/week      PT Plan Current plan remains appropriate       AM-PAC PT "6 Clicks" Mobility   Outcome Measure  Help needed turning from your back to your side while in a flat bed without using bedrails?: None Help needed moving from lying on your back to sitting on the side of a flat bed without using bedrails?: None Help needed moving to and from a bed to a chair (including a wheelchair)?: None Help needed standing up from a chair using your arms (e.g., wheelchair or bedside chair)?: None Help needed to walk in hospital room?: None Help needed climbing 3-5 steps with a railing? : A Little 6 Click Score: 23    End of Session Equipment Utilized During Treatment: Oxygen Activity Tolerance: Patient limited by fatigue  Patient left: in bed;with call bell/phone within reach;with family/visitor present;with nursing/sitter in room Nurse Communication: Mobility status PT Visit Diagnosis: Other abnormalities of gait and mobility  (R26.89);Muscle weakness (generalized) (M62.81)     Time: 1610-9604 PT Time Calculation (min) (ACUTE ONLY): 12 min  Charges:  $Gait Training: 8-22 mins                    Jetta Lout PTA 10/25/22, 2:52 PM

## 2022-10-25 NOTE — Progress Notes (Signed)
Patient ID: Joe Torres, male   DOB: 14-Jun-1945, 77 y.o.   MRN: [id number]     SURGICAL PROGRESS NOTE   Hospital Day(s): 5.   Interval History: Patient seen and examined, no acute events or new complaints overnight. Patient reports feeling uncomfortable due to abdominal distention.  This is causing abdominal pain.  He denies any nausea or vomiting.  He is having ileostomy output  Vital signs in last 24 hours: [min-max] current  Temp:  [98 F (36.7 C)-98.1 F (36.7 C)] 98.1 F (36.7 C) (06/14 0825) Pulse Rate:  [98-101] 100 (06/14 0825) Resp:  [18-19] 19 (06/14 0825) BP: (135-147)/(79-90) 135/83 (06/14 0825) SpO2:  [95 %-97 %] 95 % (06/14 0825) FiO2 (%):  [32 %] 32 % (06/13 1954)     Height: 5\' 9"  (175.3 cm) Weight: 69.8 kg BMI (Calculated): 22.71   Physical Exam:  Constitutional: alert, cooperative and no distress  Respiratory: breathing non-labored at rest  Cardiovascular: regular rate and sinus rhythm  Gastrointestinal: soft, non-tender, but distended.  The wound is dry and clean.  Ileostomy is pink and patent with bowel edema.  Labs:     Latest Ref Rng & Units 10/25/2022    5:52 AM 10/24/2022    4:34 AM 10/23/2022    4:39 AM  CBC  WBC 4.0 - 10.5 K/uL 12.1  13.0  12.2   Hemoglobin 13.0 - 17.0 g/dL 40.9  81.1  91.4   Hematocrit 39.0 - 52.0 % 41.8  41.0  40.1   Platelets 150 - 400 K/uL 408  401  355       Latest Ref Rng & Units 10/25/2022    5:52 AM 10/24/2022    4:34 AM 10/23/2022    4:39 AM  CMP  Glucose 70 - 99 mg/dL 782  956  213   BUN 8 - 23 mg/dL 24  27  21    Creatinine 0.61 - 1.24 mg/dL 0.86  5.78  4.69   Sodium 135 - 145 mmol/L 138  136  137   Potassium 3.5 - 5.1 mmol/L 3.4  3.7  3.4   Chloride 98 - 111 mmol/L 93  95  98   CO2 22 - 32 mmol/L 34  30  30   Calcium 8.9 - 10.3 mg/dL 9.6  9.2  8.9     Imaging studies: No new pertinent imaging studies   Assessment/Plan:  77 y.o. male with perforated diverticulitis 5 Day Post-Op s/p partial colectomy with anastomosis  and loop ileostomy creation, complicated by pertinent comorbidities including severe COPD, history of bladder cancer, hypertension.   -Patient continue with mild elevated heart rate.  No fever -Continue with significant abdominal distention.  She is tolerating diet without nausea and vomiting but significantly distended.  High risk for ileus.  Continue current diet.  Encourage patient to ambulate. -Continue IV antibiotic for 7 days -Encouraged the patient to ambulate -Continue pain management -Continue respiratory therapies  Gae Gallop, MD

## 2022-10-26 ENCOUNTER — Inpatient Hospital Stay: Payer: Medicare HMO

## 2022-10-26 DIAGNOSIS — K5720 Diverticulitis of large intestine with perforation and abscess without bleeding: Secondary | ICD-10-CM | POA: Diagnosis not present

## 2022-10-26 DIAGNOSIS — K567 Ileus, unspecified: Secondary | ICD-10-CM | POA: Diagnosis not present

## 2022-10-26 LAB — BASIC METABOLIC PANEL
Anion gap: 12 (ref 5–15)
BUN: 22 mg/dL (ref 8–23)
CO2: 32 mmol/L (ref 22–32)
Calcium: 10 mg/dL (ref 8.9–10.3)
Chloride: 92 mmol/L — ABNORMAL LOW (ref 98–111)
Creatinine, Ser: 1.04 mg/dL (ref 0.61–1.24)
GFR, Estimated: >60 mL/min (ref >60)
Glucose, Bld: 143 mg/dL — ABNORMAL HIGH (ref 70–99)
Potassium: 3.9 mmol/L (ref 3.5–5.1)
Sodium: 136 mmol/L (ref 135–145)

## 2022-10-26 LAB — CBC
HCT: 45.3 % (ref 39.0–52.0)
Hemoglobin: 14.5 g/dL (ref 13.0–17.0)
MCH: 29.6 pg (ref 26.0–34.0)
MCHC: 32 g/dL (ref 30.0–36.0)
MCV: 92.4 fL (ref 80.0–100.0)
Platelets: 427 10*3/uL — ABNORMAL HIGH (ref 150–400)
RBC: 4.9 MIL/uL (ref 4.22–5.81)
RDW: 13.2 % (ref 11.5–15.5)
WBC: 20.1 10*3/uL — ABNORMAL HIGH (ref 4.0–10.5)
nRBC: 0 % (ref 0.0–0.2)

## 2022-10-26 MED ORDER — PANTOPRAZOLE SODIUM 40 MG IV SOLR
40.0000 mg | Freq: Two times a day (BID) | INTRAVENOUS | Status: DC
  Administered 2022-10-26 – 2022-11-06 (×23): 40 mg via INTRAVENOUS
  Filled 2022-10-26 (×24): qty 10

## 2022-10-26 MED ORDER — SODIUM CHLORIDE 0.9 % IV SOLN: INTRAVENOUS | Status: DC

## 2022-10-26 MED ORDER — METHYLPREDNISOLONE SODIUM SUCC 40 MG IJ SOLR
40.0000 mg | Freq: Two times a day (BID) | INTRAMUSCULAR | Status: DC
  Administered 2022-10-26 – 2022-10-28 (×5): 40 mg via INTRAVENOUS
  Filled 2022-10-26 (×5): qty 1

## 2022-10-26 MED ORDER — LEVALBUTEROL HCL 0.63 MG/3ML IN NEBU
0.6300 mg | INHALATION_SOLUTION | RESPIRATORY_TRACT | Status: DC | PRN
  Filled 2022-10-26: qty 3

## 2022-10-26 MED ORDER — PROCHLORPERAZINE EDISYLATE 10 MG/2ML IJ SOLN
10.0000 mg | Freq: Once | INTRAMUSCULAR | Status: AC
  Administered 2022-10-26: 10 mg via INTRAVENOUS
  Filled 2022-10-26: qty 2

## 2022-10-26 MED ORDER — HYDRALAZINE HCL 20 MG/ML IJ SOLN: 10.0000 mg | INTRAMUSCULAR | Status: DC | PRN

## 2022-10-26 NOTE — Plan of Care (Signed)
  Problem: Education: Goal: Knowledge of General Education information will improve Description: Including pain rating scale, medication(s)/side effects and non-pharmacologic comfort measures Outcome: Progressing   Problem: Clinical Measurements: Goal: Respiratory complications will improve Outcome: Progressing Goal: Cardiovascular complication will be avoided Outcome: Progressing   Problem: Activity: Goal: Risk for activity intolerance will decrease Outcome: Progressing   Problem: Nutrition: Goal: Adequate nutrition will be maintained Outcome: Progressing   Problem: Pain Managment: Goal: General experience of comfort will improve Outcome: Progressing   

## 2022-10-26 NOTE — Plan of Care (Signed)
  Problem: Education: Goal: Knowledge of disease or condition will improve Outcome: Progressing Goal: Knowledge of the prescribed therapeutic regimen will improve Outcome: Progressing Goal: Individualized Educational Video(s) Outcome: Progressing   Problem: Activity: Goal: Ability to tolerate increased activity will improve Outcome: Progressing Goal: Will verbalize the importance of balancing activity with adequate rest periods Outcome: Progressing   Problem: Respiratory: Goal: Ability to maintain a clear airway will improve Outcome: Progressing Goal: Levels of oxygenation will improve Outcome: Progressing Goal: Ability to maintain adequate ventilation will improve Outcome: Progressing   Problem: Education: Goal: Knowledge of General Education information will improve Description: Including pain rating scale, medication(s)/side effects and non-pharmacologic comfort measures Outcome: Progressing   Problem: Health Behavior/Discharge Planning: Goal: Ability to manage health-related needs will improve Outcome: Progressing   Problem: Clinical Measurements: Goal: Ability to maintain clinical measurements within normal limits will improve Outcome: Progressing Goal: Will remain free from infection Outcome: Progressing Goal: Diagnostic test results will improve Outcome: Progressing Goal: Respiratory complications will improve Outcome: Progressing Goal: Cardiovascular complication will be avoided Outcome: Progressing   Problem: Activity: Goal: Risk for activity intolerance will decrease Outcome: Progressing   Problem: Nutrition: Goal: Adequate nutrition will be maintained Outcome: Progressing   Problem: Nutrition: Goal: Adequate nutrition will be maintained Outcome: Progressing   Problem: Coping: Goal: Level of anxiety will decrease Outcome: Progressing   Problem: Elimination: Goal: Will not experience complications related to bowel motility Outcome:  Progressing Goal: Will not experience complications related to urinary retention Outcome: Progressing   Problem: Pain Managment: Goal: General experience of comfort will improve Outcome: Progressing   Problem: Safety: Goal: Ability to remain free from injury will improve Outcome: Progressing   Problem: Skin Integrity: Goal: Risk for impaired skin integrity will decrease Outcome: Progressing

## 2022-10-26 NOTE — TOC Progression Note (Signed)
Transition of Care [facility]) - Progression Note    Patient Details  Name: Joe Torres MRN: [id number] Date of Birth: Dec 13, 1945  Transition of [facility]) CM/SW Contact  Allena Katz, LCSW Phone Number: 10/26/2022, 10:17 AM  Clinical Narrative:   Pt not medically ready to discharge as surgery has seen patient and noted clogging of his tube and significant pain as well as respiratory difficulty. Pt now NPO.     Expected Discharge Plan: Home w Home Health Services Barriers to Discharge: Continued Medical Work up  Expected Discharge Plan and Services   Discharge Planning Services: CM Consult   Living arrangements for the past 2 months: Single Family Home                 DME Arranged: Walker rolling DME Agency: [employer] Date DME Agency Contacted: 10/23/22 Time DME Agency Contacted: [phone] Representative spoke with at DME Agency: Barbara Cower HH Arranged: PT, RN [facility] Agency: [facility] Date [facility] Agency Contacted: 10/23/22 Time HH Agency Contacted: [phone] Representative spoke with at [facility] Agency: [place]   Social Determinants of Health (SDOH) Interventions SDOH Screenings   Food Insecurity: No Food Insecurity (10/20/2022)  Housing: Low Risk  (10/20/2022)  Transportation Needs: No Transportation Needs (10/20/2022)  Utilities: Not At Risk (10/20/2022)  Tobacco Use: Medium Risk (10/21/2022)    Readmission Risk Interventions     No data to display

## 2022-10-26 NOTE — Progress Notes (Addendum)
Progress Note   Patient: Joe Torres:[id number] DOB: 05/07/1946 DOA: 10/20/2022     6 DOS: the patient was seen and examined on 10/26/2022       Brief hospital course: From HPI "Joe Torres is a 77 y.o. male with medical history significant of chronic respiratory failure on 2 to 3 L, COPD, GERD, hypertension, history of bladder cancer in remission presenting with abdominal pain, perforated diverticulitis, COPD exacerbation, acute on chronic respiratory failure hypoxia.  Patient ports increased work of breathing over the past 4 to 5 days.  Positive wheezing cough, increased sputum production.  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  Positive increased work of breathing despite inhaler use.  No reported sick contacts.  No fevers or chills.  No hemiparesis or confusion.  Patient also ports worsening lower abdominal pain over the past 3 days.  Mild nausea but no vomiting.  No reported diarrhea.  No reported recent dietary changes.  Prior surgical history includes transurethral resection of bladder tumor in 2022 as well as chemoradiation to the area.  Noted prior colonoscopy September 2022 with reported diverticulosis.  Also with hernia repair in 2017. Presented to the ER afebrile, hemodynamically stable, satting 9 9% on 3 L.  White count 12.4, hemoglobin 14.3, platelets 348, creatinine 1.11, urinalysis within normal limits.  CTA of the chest as well as CT of the abdomen pelvis obtained showing severe centrilobular emphysema concerning for COPD as well as small to moderate pneumoperitoneum with concern for perforated sigmoid diverticulitis without abscess.  Dr. Maia Plan with general surgery evaluating patient with plan for operative repair today."    Subject / interval history - pt more short of breath due to severe persistent abdominal distention.  Stated this AM he feels like he is drowning, unable to catch his breath.  Little stool output from  ileostomy.  Surgery at bedside attempting to suction gastric contents that are clogging NG tube.  Wife updated via [employer] account executive.     Assessment and Plan:   Diverticulitis of colon with perforation s/p surgery Noted lower abdominal pain x 2 to 3 days with diverticulitis with perforation on CT scan Patient underwent low anterior resection with primary anastomosis and diverting loop ileostomy creation by surgeon done on 10/20/2022 --Dr. Maia Plan, general surgery following - see their recs --Continue IV Zosyn  --Postoperative supportive care, pain control --ostomy teaching / training is underway for family and patient --Continue pain medication as needed -- adjust regimen so IV morphine not needed for adequate relief 6/14: change oxycodone to oral dilaudid (pt reports feeling more short of breath and tight in his chest after oxycodone) --Advancement of diet according to surgical recommendation -- soft diet today --Simethicone for distention added  Postoperative Ileus -- NG tube placed today 6/15. --Mgmt per general surgery --NG tube to wall suction --NPO --IV med substitutions where able --Gentle maintenance IV fluids   Acute on chronic respiratory failure with hypoxia (HCC) Severe COPD/Emphysema with Acute Exacerbation Acute decompensated respiratory failure in the setting of COPD exacerbation with cough wheezing, increased sputum production.  CT scan chest showed severe emphysema. Baseline home O2 use around 2-3L.  Stable on 3 L/min here. Completed 5 days course of prednisone 40 mg, resumed on his chronic daily prednisone 10 mg. --6/15 -- resuming IV Solu-medrol 40 mg BID for recurrent exacerbation --Continue neb treatments, Daliresp --Change albuterol nebs >> Xopenex due to tachycardia --Hold prednisone 10 mg  daily   Hypokalemia - replaced 6/12, 6/14. --Monitor BMP, check Mg levels periodically   Malignant neoplasm of overlapping sites of bladder [facility]) Noted prior history of  high-grade urothelial carcinoma  s/p concurrent chemoradiation with 5-FU mitomycin ending in March 2022  No evidence of disease per 05/2022 Rad-onc note Continue to monitor closely-outpatient follow-up with oncologist   Hypertension BP stable Hold amlodipine, HCTZ, losartan while NPO PRN IV hydralazine for now Avoiding IV labetalol due to severe emphysema/COPD   Hyperlipidemia Hold statin while NPO   GERD (gastroesophageal reflux disease) PPI - currently on IV Protonix daily -- increase to BID given significant symptomatic reflux Stopping Maalox due to elevated Mg today (6/13)   BPH (benign prostatic hyperplasia) 6/12-13: pt having issues voiding, weak stream, difficulty initiating stream, sensation of incomplete emptying --Continue Flomax --Monitor post-void residuals by bladder scan --Added Proscar 6/13 6/14: pt reports improved stream with voiding        Advance Care Planning:   Code Status: Full Code    Consults: General Surgery w/ Dr. Maia Plan    Family Communication: Wife at bedside on AM rounds.   Daughters updated on their arrival later this AM.     Physical Exam:  General exam: awake, alert, in mild distress with pain and dyspnea HEENT: moist mucus membranes, hearing grossly normal  Respiratory system: severely diminished with diffuse high-pitched expiratory wheezes, increased respiratory effort with accessory muscle use, O2 sat stable on 3 L/min O2. Cardiovascular system: normal S1/S2, tachycardic,  no pedal edema.   Gastrointestinal system: distended abdomen, surgeon suctioning out stoma, NG tube present. Central nervous system: A&O x 3. no gross focal neurologic deficits, normal speech Extremities: moves all, no edema, normal tone Skin: dry, intact, normal temperature Psychiatry: normal mood, congruent affect, judgement and insight appear normal        Data Reviewed:  Notable labs --- Cl 92, glucose 143, WBC 12.1 >> 20.1, platelets 427k   Chest xray this  AM post-NGT insertion -- no acute cardiopulmonary disease (no PNA, no PTX, no pleural effusions, no pulmonary edema).  Tip of NG tube was extending below the lower margin of the image but otherwise seen present in the stomach    Patient continues to be high risk for morbidity and mortality on account of underlying severe emphysema.      Vitals:   10/26/22 0028 10/26/22 0740 10/26/22 0757 10/26/22 0802  BP: (!) 146/79 (!) 152/89 128/88   Pulse: 99 (!) 105 (!) 109 100  Resp: 18 20  20   Temp: 98 F (36.7 C) 97.8 F (36.6 C)    TempSrc:      SpO2: 96% 97% 99% 99%  Weight:      Height:         Author: Pennie Banter, DO 10/26/2022 2:30 PM  For on call review [email or web address].

## 2022-10-26 NOTE — Progress Notes (Signed)
Patient ID: Joe Torres, male   DOB: [birth year], 77 y.o.   MRN: [id number]     SURGICAL PROGRESS NOTE   Hospital Day(s): 6.   Interval History: Patient seen and examined. Patient reports having significant pain and distension. He feels like he is drowning. Pain generalized. Endorses nausea. No vomiting yet.  Vital signs in last 24 hours: [min-max] current  Temp:  [97.8 F (36.6 C)-98.1 F (36.7 C)] 97.8 F (36.6 C) (06/15 0740) Pulse Rate:  [99-109] 100 (06/15 0802) Resp:  [16-20] 20 (06/15 0802) BP: (128-152)/(79-89) 128/88 (06/15 0757) SpO2:  [96 %-99 %] 99 % (06/15 0802) FiO2 (%):  [32 %] 32 % (06/14 2006)     Height: 5\' 9"  (175.3 cm) Weight: 69.8 kg BMI (Calculated): 22.71   Physical Exam:  Constitutional: alert, cooperative and no distress  Respiratory: breathing non-labored at rest  Cardiovascular: regular rate and sinus rhythm  Gastrointestinal: soft, mild-tender, significantly distended. Wound dry and clean. Ileostomy with edema but patient.   Labs:     Latest Ref Rng & Units 10/26/2022    6:15 AM 10/25/2022    5:52 AM 10/24/2022    4:34 AM  CBC  WBC 4.0 - 10.5 K/uL 20.1  12.1  13.0   Hemoglobin 13.0 - 17.0 g/dL 40.9  81.1  91.4   Hematocrit 39.0 - 52.0 % 45.3  41.8  41.0   Platelets 150 - 400 K/uL 427  408  401       Latest Ref Rng & Units 10/26/2022    6:15 AM 10/25/2022    5:52 AM 10/24/2022    4:34 AM  CMP  Glucose 70 - 99 mg/dL 782  956  213   BUN 8 - 23 mg/dL 22  24  27    Creatinine 0.61 - 1.24 mg/dL 0.86  5.78  4.69   Sodium 135 - 145 mmol/L 136  138  136   Potassium 3.5 - 5.1 mmol/L 3.9  3.4  3.7   Chloride 98 - 111 mmol/L 92  93  95   CO2 22 - 32 mmol/L 32  34  30   Calcium 8.9 - 10.3 mg/dL 62.9  9.6  9.2     Imaging studies: No new pertinent imaging studies   Assessment/Plan:  77 y.o. male with perforated diverticulitis 6 Day Post-Op s/p partial colectomy with anastomosis and loop ileostomy creation, complicated by pertinent comorbidities including  severe COPD, history of bladder cancer, hypertension.   Patient clinically with setback due to post op ileus. I think that respiratory difficulty is due to ileus compressing his already compromised lungs.   I placed NGT to low intermittent suction. There was a lot of solid food clogging the tube.   Will place patient NPO. Continue IV abx therapy. Judicious with pain management. Continue respiratory therapy.   Gae Gallop, MD

## 2022-10-27 DIAGNOSIS — K5720 Diverticulitis of large intestine with perforation and abscess without bleeding: Secondary | ICD-10-CM | POA: Diagnosis not present

## 2022-10-27 LAB — BASIC METABOLIC PANEL
Anion gap: 11 (ref 5–15)
BUN: 32 mg/dL — ABNORMAL HIGH (ref 8–23)
CO2: 32 mmol/L (ref 22–32)
Calcium: 9.9 mg/dL (ref 8.9–10.3)
Chloride: 94 mmol/L — ABNORMAL LOW (ref 98–111)
Creatinine, Ser: 1.16 mg/dL (ref 0.61–1.24)
GFR, Estimated: >60 mL/min (ref >60)
Glucose, Bld: 165 mg/dL — ABNORMAL HIGH (ref 70–99)
Potassium: 4.4 mmol/L (ref 3.5–5.1)
Sodium: 137 mmol/L (ref 135–145)

## 2022-10-27 LAB — CBC
HCT: 47.6 % (ref 39.0–52.0)
Hemoglobin: 14.8 g/dL (ref 13.0–17.0)
MCH: 29.5 pg (ref 26.0–34.0)
MCHC: 31.1 g/dL (ref 30.0–36.0)
MCV: 95 fL (ref 80.0–100.0)
Platelets: 469 10*3/uL — ABNORMAL HIGH (ref 150–400)
RBC: 5.01 MIL/uL (ref 4.22–5.81)
RDW: 13.2 % (ref 11.5–15.5)
WBC: 24 10*3/uL — ABNORMAL HIGH (ref 4.0–10.5)
nRBC: 0 % (ref 0.0–0.2)

## 2022-10-27 MED ORDER — SIMETHICONE 80 MG PO CHEW
80.0000 mg | CHEWABLE_TABLET | Freq: Four times a day (QID) | ORAL | Status: DC | PRN
  Administered 2022-10-27 – 2022-10-29 (×3): 80 mg via ORAL
  Filled 2022-10-27 (×4): qty 1

## 2022-10-27 MED ORDER — LEVALBUTEROL HCL 0.63 MG/3ML IN NEBU
0.6300 mg | INHALATION_SOLUTION | Freq: Four times a day (QID) | RESPIRATORY_TRACT | Status: DC
  Administered 2022-10-28 – 2022-11-06 (×25): 0.63 mg via RESPIRATORY_TRACT
  Filled 2022-10-27 (×42): qty 3

## 2022-10-27 MED ORDER — AMLODIPINE BESYLATE 10 MG PO TABS
10.0000 mg | ORAL_TABLET | Freq: Every day | ORAL | Status: DC
  Administered 2022-10-27 – 2022-10-31 (×5): 10 mg via ORAL
  Filled 2022-10-27 (×5): qty 1

## 2022-10-27 MED ORDER — HYDROCHLOROTHIAZIDE 25 MG PO TABS
25.0000 mg | ORAL_TABLET | Freq: Every day | ORAL | Status: DC
  Administered 2022-10-27 – 2022-11-01 (×5): 25 mg via ORAL
  Filled 2022-10-27 (×6): qty 1

## 2022-10-27 MED ORDER — MONTELUKAST SODIUM 10 MG PO TABS
10.0000 mg | ORAL_TABLET | Freq: Every day | ORAL | Status: DC
  Administered 2022-10-27 – 2022-10-31 (×5): 10 mg via ORAL
  Filled 2022-10-27 (×5): qty 1

## 2022-10-27 MED ORDER — LOSARTAN POTASSIUM 25 MG PO TABS
25.0000 mg | ORAL_TABLET | Freq: Every day | ORAL | Status: DC
  Administered 2022-10-27 – 2022-10-31 (×5): 25 mg via ORAL
  Filled 2022-10-27 (×5): qty 1

## 2022-10-27 MED ORDER — LEVALBUTEROL HCL 0.63 MG/3ML IN NEBU
0.6300 mg | INHALATION_SOLUTION | Freq: Four times a day (QID) | RESPIRATORY_TRACT | Status: DC
  Administered 2022-10-27 (×2): 0.63 mg via RESPIRATORY_TRACT
  Filled 2022-10-27 (×3): qty 3

## 2022-10-27 MED ORDER — LORATADINE 10 MG PO TABS
10.0000 mg | ORAL_TABLET | Freq: Every day | ORAL | Status: DC
  Administered 2022-10-27 – 2022-11-01 (×5): 10 mg via ORAL
  Filled 2022-10-27 (×6): qty 1

## 2022-10-27 MED ORDER — FLUTICASONE PROPIONATE 50 MCG/ACT NA SUSP
1.0000 | Freq: Every day | NASAL | Status: DC
  Administered 2022-10-27 – 2022-11-06 (×6): 1 via NASAL
  Filled 2022-10-27: qty 16

## 2022-10-27 NOTE — Plan of Care (Signed)
?  Problem: Education: ?Goal: Knowledge of disease or condition will improve ?Outcome: Progressing ?Goal: Knowledge of the prescribed therapeutic regimen will improve ?Outcome: Progressing ?  ?Problem: Activity: ?Goal: Ability to tolerate increased activity will improve ?Outcome: Progressing ?Goal: Will verbalize the importance of balancing activity with adequate rest periods ?Outcome: Progressing ?  ?Problem: Respiratory: ?Goal: Ability to maintain a clear airway will improve ?Outcome: Progressing ?Goal: Levels of oxygenation will improve ?Outcome: Progressing ?Goal: Ability to maintain adequate ventilation will improve ?Outcome: Progressing ?  ?Problem: Education: ?Goal: Knowledge of General Education information will improve ?Description: Including pain rating scale, medication(s)/side effects and non-pharmacologic comfort measures ?Outcome: Progressing ?  ?Problem: Health Behavior/Discharge Planning: ?Goal: Ability to manage health-related needs will improve ?Outcome: Progressing ?  ?Problem: Clinical Measurements: ?Goal: Ability to maintain clinical measurements within normal limits will improve ?Outcome: Progressing ?Goal: Will remain free from infection ?Outcome: Progressing ?Goal: Diagnostic test results will improve ?Outcome: Progressing ?Goal: Respiratory complications will improve ?Outcome: Progressing ?Goal: Cardiovascular complication will be avoided ?Outcome: Progressing ?  ?Problem: Activity: ?Goal: Risk for activity intolerance will decrease ?Outcome: Progressing ?  ?Problem: Nutrition: ?Goal: Adequate nutrition will be maintained ?Outcome: Progressing ?  ?Problem: Coping: ?Goal: Level of anxiety will decrease ?Outcome: Progressing ?  ?Problem: Elimination: ?Goal: Will not experience complications related to bowel motility ?Outcome: Progressing ?Goal: Will not experience complications related to urinary retention ?Outcome: Progressing ?  ?Problem: Pain Managment: ?Goal: General experience of comfort  will improve ?Outcome: Progressing ?  ?Problem: Safety: ?Goal: Ability to remain free from injury will improve ?Outcome: Progressing ?  ?Problem: Skin Integrity: ?Goal: Risk for impaired skin integrity will decrease ?Outcome: Progressing ?  ?

## 2022-10-27 NOTE — Progress Notes (Signed)
The author informed Manuela Schwartz, NP and Dr. Tonna Boehringer of General Surgery regarding the NGT of the patient.

## 2022-10-27 NOTE — Progress Notes (Signed)
Progress Note   Patient: Joe Torres ZOX:[id number] DOB: 1946-05-08 DOA: 10/20/2022     7 DOS: the patient was seen and examined on 10/27/2022       Brief hospital course: From HPI "Joe Torres is a 77 y.o. male with medical history significant of chronic respiratory failure on 2 to 3 L, COPD, GERD, hypertension, history of bladder cancer in remission presenting with abdominal pain, perforated diverticulitis, COPD exacerbation, acute on chronic respiratory failure hypoxia.  Patient ports increased work of breathing over the past 4 to 5 days.  Positive wheezing cough, increased sputum production.  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  Positive increased work of breathing despite inhaler use.  No reported sick contacts.  No fevers or chills.  No hemiparesis or confusion.  Patient also ports worsening lower abdominal pain over the past 3 days.  Mild nausea but no vomiting.  No reported diarrhea.  No reported recent dietary changes.  Prior surgical history includes transurethral resection of bladder tumor in 2022 as well as chemoradiation to the area.  Noted prior colonoscopy September 2022 with reported diverticulosis.  Also with hernia repair in 2017. Presented to the ER afebrile, hemodynamically stable, satting 9 9% on 3 L.  White count 12.4, hemoglobin 14.3, platelets 348, creatinine 1.11, urinalysis within normal limits.  CTA of the chest as well as CT of the abdomen pelvis obtained showing severe centrilobular emphysema concerning for COPD as well as small to moderate pneumoperitoneum with concern for perforated sigmoid diverticulitis without abscess.  Dr. Maia Plan with general surgery evaluating patient with plan for operative repair today."    Subject / interval history - pt more short of breath due to severe persistent abdominal distention.  Stated this AM he feels like he is drowning, unable to catch his breath.  Little stool output from  ileostomy.  Surgery at bedside attempting to suction gastric contents that are clogging NG tube.  Wife updated via [employer] account executive.     Assessment and Plan:   Diverticulitis of colon with perforation s/p surgery Noted lower abdominal pain x 2 to 3 days with diverticulitis with perforation on CT scan Patient underwent low anterior resection with primary anastomosis and diverting loop ileostomy creation by surgeon done on 10/20/2022 --Dr. Maia Plan, general surgery following - see their recs --Continue IV Zosyn  --Postoperative supportive care, pain control --ostomy teaching / training is underway for family and patient --Continue pain medication as needed -- adjust regimen so IV morphine not needed for adequate relief 6/14: changed oxycodone to oral dilaudid (pt reports feeling more short of breath and tight in his chest after oxycodone) --Advancement of diet according to surgical recommendation -- soft diet today --Simethicone PRN for distention / gas pains  Postoperative Ileus -- NG tube placed today 6/15, was accidentally dislodged overnight. 6/16 - abdomen less distended, good output from ileostomy this AM --Mgmt per general surgery --NPO >> Clear liquid diet trial today --Resume PO antihypertensives --Gentle maintenance IV fluids   Acute on chronic respiratory failure with hypoxia (HCC) Severe COPD/Emphysema with Acute Exacerbation Acute decompensated respiratory failure in the setting of COPD exacerbation with cough wheezing, increased sputum production.  CT scan chest showed severe emphysema. Baseline home O2 use around 2-3L.  Stable on 3 L/min here. Completed 5 days course of prednisone 40 mg, resumed on his chronic daily prednisone 10 mg. --6/15 -- resuming IV Solu-medrol 40 mg BID for recurrent  exacerbation --Continue neb treatments, Daliresp --Change albuterol nebs >> Xopenex due to tachycardia --Hold prednisone 10 mg daily   Hypokalemia - replaced 6/12, 6/14. --Monitor  BMP, check Mg levels periodically   Malignant neoplasm of overlapping sites of bladder [facility]) Noted prior history of high-grade urothelial carcinoma  s/p concurrent chemoradiation with 5-FU mitomycin ending in March 2022  No evidence of disease per 05/2022 Rad-onc note Continue to monitor closely-outpatient follow-up with oncologist   Hypertension BP stable Resume amlodipine, HCTZ, losartan -- held 6/15 while NPO with NG tube PRN IV hydralazine for now Avoiding beta blockers due to severe emphysema/COPD   Hyperlipidemia Hold statin while NPO   GERD (gastroesophageal reflux disease) PPI - currently on IV Protonix daily -- increase to BID given significant symptomatic reflux Stopping Maalox due to elevated Mg today (6/13)   BPH (benign prostatic hyperplasia) 6/12-13: pt having issues voiding, weak stream, difficulty initiating stream, sensation of incomplete emptying --Continue Flomax --Monitor post-void residuals by bladder scan --Added Proscar 6/13 6/14: pt reports improved stream with voiding        Advance Care Planning:   Code Status: Full Code    Consults: General Surgery w/ Dr. Maia Plan    Family Communication: Wife and daughters at bedside on AM rounds.        Physical Exam:  General exam: awake, alert, in mild distress with pain and dyspnea HEENT: moist mucus membranes, hearing grossly normal  Respiratory system: improved aeration, no active wheezing, mildly increased respiratory effort with slight accessory muscle use, O2 sat stable on 3 L/min O2. Cardiovascular system: normal S1/S2, RRR, no pedal edema.   Gastrointestinal system: slightly less distended abdomen, ileostomy bag with gas and liquid stool in bag, midline incision staples intact. Central nervous system: A&O x 3. no gross focal neurologic deficits, normal speech Extremities: moves all, no edema, normal tone Skin: dry, intact, normal temperature Psychiatry: normal mood, congruent affect, judgement and  insight appear normal        Data Reviewed:  Notable labs --- Cl 94, glucose 165, BUN 32.  WBC 20.1 >> 24.0, platelets 427 >> 469k   Chest xray 6/15 post-NGT insertion -- no acute cardiopulmonary disease (no PNA, no PTX, no pleural effusions, no pulmonary edema).  Tip of NG tube was extending below the lower margin of the image but otherwise seen present in the stomach    Patient continues to be high risk for morbidity and mortality on account of underlying severe emphysema.      Vitals:   10/26/22 1555 10/26/22 2121 10/26/22 2255 10/27/22 0815  BP: (!) 157/81 (!) 160/91 (!) 149/104 (!) 141/75  Pulse:  (!) 117 (!) 108 (!) 107  Resp: 18 (!) 22 18 18   Temp: 98.5 F (36.9 C) 99.1 F (37.3 C) 99.1 F (37.3 C) 97.6 F (36.4 C)  TempSrc:    Oral  SpO2: 98% 98% 96% 95%  Weight:      Height:         Author: Pennie Banter, DO 10/27/2022 12:10 PM  For on call review [email or web address].

## 2022-10-27 NOTE — Plan of Care (Signed)
  Problem: Education: Goal: Knowledge of General Education information will improve Description: Including pain rating scale, medication(s)/side effects and non-pharmacologic comfort measures Outcome: Progressing   Problem: Health Behavior/Discharge Planning: Goal: Ability to manage health-related needs will improve Outcome: Progressing   Problem: Clinical Measurements: Goal: Ability to maintain clinical measurements within normal limits will improve Outcome: Progressing Goal: Diagnostic test results will improve Outcome: Progressing Goal: Respiratory complications will improve Outcome: Progressing Goal: Cardiovascular complication will be avoided Outcome: Progressing   Problem: Activity: Goal: Risk for activity intolerance will decrease Outcome: Progressing   Problem: Pain Managment: Goal: General experience of comfort will improve Outcome: Progressing   

## 2022-10-27 NOTE — Progress Notes (Signed)
Patient ID: Joe Torres, male   DOB: 1945/06/12, 77 y.o.   MRN: [id number]     SURGICAL PROGRESS NOTE   Hospital Day(s): 7.   Interval History: Patient seen and examined, no acute events or new complaints overnight. Patient reports feeling a lot better today. He endorses that the abdomen is softer.  He feels much better.  He feels breathing more comfortable.  He endorses that he is passing a lot of gas and a lot of stool in the ileostomy.  Vital signs in last 24 hours: [min-max] current  Temp:  [97.6 F (36.4 C)-99.1 F (37.3 C)] 97.6 F (36.4 C) (06/16 0815) Pulse Rate:  [107-117] 107 (06/16 0815) Resp:  [18-22] 18 (06/16 0815) BP: (141-160)/(75-104) 141/75 (06/16 0815) SpO2:  [95 %-98 %] 95 % (06/16 0815)     Height: 5\' 9"  (175.3 cm) Weight: 69.8 kg BMI (Calculated): 22.71   Physical Exam:  Constitutional: alert, cooperative and no distress  Respiratory: breathing non-labored at rest  Cardiovascular: regular rate and sinus rhythm  Gastrointestinal: soft, non-tender, and mildly-distended.  Ileostomy is pink and patent  Labs:     Latest Ref Rng & Units 10/27/2022    5:15 AM 10/26/2022    6:15 AM 10/25/2022    5:52 AM  CBC  WBC 4.0 - 10.5 K/uL 24.0  20.1  12.1   Hemoglobin 13.0 - 17.0 g/dL 40.9  81.1  91.4   Hematocrit 39.0 - 52.0 % 47.6  45.3  41.8   Platelets 150 - 400 K/uL 469  427  408       Latest Ref Rng & Units 10/27/2022    5:15 AM 10/26/2022    6:15 AM 10/25/2022    5:52 AM  CMP  Glucose 70 - 99 mg/dL 782  956  213   BUN 8 - 23 mg/dL 32  22  24   Creatinine 0.61 - 1.24 mg/dL 0.86  5.78  4.69   Sodium 135 - 145 mmol/L 137  136  138   Potassium 3.5 - 5.1 mmol/L 4.4  3.9  3.4   Chloride 98 - 111 mmol/L 94  92  93   CO2 22 - 32 mmol/L 32  32  34   Calcium 8.9 - 10.3 mg/dL 9.9  62.9  9.6     Imaging studies: No new pertinent imaging studies   Assessment/Plan:  77 y.o. male with perforated diverticulitis 7 Day Post-Op s/p partial colectomy with anastomosis and loop  ileostomy creation, complicated by pertinent comorbidities including severe COPD, history of bladder cancer, hypertension.   -Today the patient is feeling much better.  The ileus is resolving with good ileostomy output and gas. -NGT came off.  Will start clear liquid diet -Increasing white blood cell count may be due to ileus or use of IV steroids -No fever.  Abdomen is nontender.  Ileus resolved. -Encourage patient to ambulate -Continue pain management -Continue DVT prophylaxis  Gae Gallop, MD

## 2022-10-27 NOTE — Progress Notes (Signed)
Patient accidentally pulled the NGT  while he tried to sit on the edge of the bed to urinate.

## 2022-10-28 ENCOUNTER — Inpatient Hospital Stay: Payer: Medicare HMO

## 2022-10-28 DIAGNOSIS — K5720 Diverticulitis of large intestine with perforation and abscess without bleeding: Secondary | ICD-10-CM | POA: Diagnosis not present

## 2022-10-28 LAB — CBC
HCT: 39.9 % (ref 39.0–52.0)
Hemoglobin: 12.5 g/dL — ABNORMAL LOW (ref 13.0–17.0)
MCH: 29.7 pg (ref 26.0–34.0)
MCHC: 31.3 g/dL (ref 30.0–36.0)
MCV: 94.8 fL (ref 80.0–100.0)
Platelets: 348 10*3/uL (ref 150–400)
RBC: 4.21 MIL/uL — ABNORMAL LOW (ref 4.22–5.81)
RDW: 13.2 % (ref 11.5–15.5)
WBC: 17.3 10*3/uL — ABNORMAL HIGH (ref 4.0–10.5)
nRBC: 0 % (ref 0.0–0.2)

## 2022-10-28 LAB — BASIC METABOLIC PANEL
Anion gap: 9 (ref 5–15)
BUN: 20 mg/dL (ref 8–23)
CO2: 31 mmol/L (ref 22–32)
Calcium: 8.7 mg/dL — ABNORMAL LOW (ref 8.9–10.3)
Chloride: 97 mmol/L — ABNORMAL LOW (ref 98–111)
Creatinine, Ser: 0.85 mg/dL (ref 0.61–1.24)
GFR, Estimated: >60 mL/min (ref >60)
Glucose, Bld: 135 mg/dL — ABNORMAL HIGH (ref 70–99)
Potassium: 4.1 mmol/L (ref 3.5–5.1)
Sodium: 137 mmol/L (ref 135–145)

## 2022-10-28 LAB — MAGNESIUM: Magnesium: 2.2 mg/dL (ref 1.7–2.4)

## 2022-10-28 MED ORDER — HEPARIN SOD (PORK) LOCK FLUSH 100 UNIT/ML IV SOLN
500.0000 [IU] | INTRAVENOUS | Status: DC
  Administered 2022-10-28: 500 [IU]

## 2022-10-28 MED ORDER — METHYLPREDNISOLONE SODIUM SUCC 40 MG IJ SOLR
40.0000 mg | Freq: Every day | INTRAMUSCULAR | Status: DC
  Administered 2022-10-29 – 2022-11-05 (×7): 40 mg via INTRAVENOUS
  Filled 2022-10-28 (×7): qty 1

## 2022-10-28 MED ORDER — HEPARIN SOD (PORK) LOCK FLUSH 100 UNIT/ML IV SOLN: 500.0000 [IU] | INTRAVENOUS | Status: DC | PRN

## 2022-10-28 MED ORDER — LIDOCAINE-PRILOCAINE 2.5-2.5 % EX CREA
TOPICAL_CREAM | Freq: Once | CUTANEOUS | Status: AC
  Filled 2022-10-28: qty 5

## 2022-10-28 NOTE — Progress Notes (Signed)
Progress Note   Patient: Joe Torres:[id number] DOB: 30-Nov-1945 DOA: 10/20/2022     8 DOS: the patient was seen and examined on 10/28/2022       Brief hospital course: From HPI "Joe Torres is a 77 y.o. male with medical history significant of chronic respiratory failure on 2 to 3 L, COPD, GERD, hypertension, history of bladder cancer in remission presenting with abdominal pain, perforated diverticulitis, COPD exacerbation, acute on chronic respiratory failure hypoxia.  Patient ports increased work of breathing over the past 4 to 5 days.  Positive wheezing cough, increased sputum production.  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  Positive increased work of breathing despite inhaler use.  No reported sick contacts.  No fevers or chills.  No hemiparesis or confusion.  Patient also ports worsening lower abdominal pain over the past 3 days.  Mild nausea but no vomiting.  No reported diarrhea.  No reported recent dietary changes.  Prior surgical history includes transurethral resection of bladder tumor in 2022 as well as chemoradiation to the area.  Noted prior colonoscopy September 2022 with reported diverticulosis.  Also with hernia repair in 2017. Presented to the ER afebrile, hemodynamically stable, satting 9 9% on 3 L.  White count 12.4, hemoglobin 14.3, platelets 348, creatinine 1.11, urinalysis within normal limits.  CTA of the chest as well as CT of the abdomen pelvis obtained showing severe centrilobular emphysema concerning for COPD as well as small to moderate pneumoperitoneum with concern for perforated sigmoid diverticulitis without abscess.  Dr. Maia Plan with general surgery evaluating patient with plan for operative repair today."    Subject / interval history - pt more short of breath due to severe persistent abdominal distention.  Stated this AM he feels like he is drowning, unable to catch his breath.  Little stool output from  ileostomy.  Surgery at bedside attempting to suction gastric contents that are clogging NG tube.  Wife updated via Advertising account executive.     Assessment and Plan:   Diverticulitis of colon with perforation s/p surgery Noted lower abdominal pain x 2 to 3 days with diverticulitis with perforation on CT scan Patient underwent low anterior resection with primary anastomosis and diverting loop ileostomy creation by surgeon done on 10/20/2022 --Dr. Maia Plan, general surgery following - see their recs --Continue IV Zosyn  --Postoperative supportive care, pain control --ostomy teaching / training is underway for family and patient --Continue pain medication as needed -- adjust regimen so IV morphine not needed for adequate relief 6/14: changed oxycodone to oral dilaudid (pt reports feeling more short of breath and tight in his chest after oxycodone) --Advancement of diet according to surgical recommendation -- soft diet today --Simethicone PRN for distention / gas pains  Postoperative Ileus -- NG tube placed today 6/15, was accidentally dislodged overnight. 6/16 - abdomen less distended, good output from ileostomy this AM --Mgmt per general surgery --6/17 Clears >> Full liquid diet today per surgery --Gentle maintenance IV fluids   Acute on chronic respiratory failure with hypoxia (HCC) Severe COPD/Emphysema with Acute Exacerbation Acute decompensated respiratory failure in the setting of COPD exacerbation with cough wheezing, increased sputum production.  CT scan chest showed severe emphysema. Baseline home O2 use around 2-3L.  Stable on 3 L/min here. Completed 5 days course of prednisone 40 mg, resumed on his chronic daily prednisone 10 mg. --6/15 -- resuming IV Solu-medrol 40 mg BID for recurrent exacerbation --  6/17 -- reduce Solu-medrol to 40 md DAILY --Continue neb treatments, Daliresp --Change albuterol nebs >> Xopenex due to tachycardia --Hold prednisone 10 mg daily   Hypokalemia -  replaced 6/12, 6/14. --Monitor BMP, check Mg levels periodically   Malignant neoplasm of overlapping sites of bladder [facility]) Noted prior history of high-grade urothelial carcinoma  s/p concurrent chemoradiation with 5-FU mitomycin ending in March 2022  No evidence of disease per 05/2022 Rad-onc note Continue to monitor closely-outpatient follow-up with oncologist   Hypertension BP stable Resume amlodipine, HCTZ, losartan -- held 6/15 while NPO with NG tube PRN IV hydralazine for now Avoiding beta blockers due to severe emphysema/COPD   Hyperlipidemia Hold statin while NPO   GERD (gastroesophageal reflux disease) PPI - currently on IV Protonix daily -- increase to BID given significant symptomatic reflux Stopping Maalox due to elevated Mg today (6/13)   BPH (benign prostatic hyperplasia) 6/12-13: pt having issues voiding, weak stream, difficulty initiating stream, sensation of incomplete emptying --Continue Flomax --Monitor post-void residuals by bladder scan --Added Proscar 6/13 6/14: pt reports improved stream with voiding        Advance Care Planning:   Code Status: Full Code    Consults: General Surgery w/ Dr. Maia Plan    Family Communication: Wife at bedside on AM rounds.        Physical Exam:  General exam: awake, alert, in mild distress with pain and dyspnea HEENT: moist mucus membranes, hearing grossly normal  Respiratory system: severely diminished but without expiratory wheezing, mildly increased respiratory effort with slight accessory muscle use, O2 sat stable on 3 L/min O2. Cardiovascular system: normal S1/S2, RRR, no pedal edema.   Gastrointestinal system: distended abdomen, ileostomy bag with NO gas or stool in bag, midline incision staples intact. Central nervous system: A&O x 3. no gross focal neurologic deficits, normal speech Extremities: moves all, no edema, normal tone Skin: dry, intact, normal temperature Psychiatry: normal mood, congruent affect,  judgement and insight appear normal        Data Reviewed:  Notable labs --- Cl 97, glucose 135, WBC 24.0 >> 17.3       Patient continues to be high risk for morbidity and mortality on account of underlying severe emphysema.      Vitals:   10/27/22 1940 10/28/22 0000 10/28/22 0716 10/28/22 0734  BP:  (!) 129/92 (!) 145/70 (!) 144/80  Pulse: 90 91 92   Resp: 19 18 20    Temp:  97.9 F (36.6 C) 98.1 F (36.7 C)   TempSrc:  Oral Oral   SpO2: 97% 98% 99%   Weight:      Height:         Author: Pennie Banter, DO 10/28/2022 11:41 AM  For on call review [email or web address].

## 2022-10-28 NOTE — Progress Notes (Signed)
OT Cancellation Note  Patient Details Name: Joe Torres MRN: [id number] DOB: [birth year]   Cancelled Treatment:    Reason Eval/Treat Not Completed: Other (comment). Pt not in his room. Per additional chart review, pt moved rooms. Will re-attempt OT tx at later time once pt is settled into new room.   Arman Filter., MPH, MS, OTR/L ascom [phone] 10/28/22, 2:15 PM

## 2022-10-28 NOTE — Plan of Care (Signed)
Patient A&Ox4, from home, up with assist in room. Pain partially controlled with medication and rest. Patient has been c/o burning sensation on abdomen this shift. Patient's family also concerned with the different color of secretions from NG tube from the last time he had one placed. IV fluid maintained.

## 2022-10-28 NOTE — Progress Notes (Signed)
Patient ID: Joe Torres, male   DOB: Oct 03, 1945, 77 y.o.   MRN: [id number]     SURGICAL PROGRESS NOTE   Hospital Day(s): 8.   Interval History: Patient seen and examined, no acute events or new complaints overnight. Patient reports no new complains. Endorses continue feeling more comfortable. Improved abdominal pain. No nausea. Increased ileostomy output.   Vital signs in last 24 hours: [min-max] current  Temp:  [97.6 F (36.4 C)-97.9 F (36.6 C)] 97.9 F (36.6 C) (06/17 0000) Pulse Rate:  [89-107] 91 (06/17 0000) Resp:  [18-19] 18 (06/17 0000) BP: (129-141)/(75-92) 129/92 (06/17 0000) SpO2:  [95 %-98 %] 98 % (06/17 0000) FiO2 (%):  [32 %] 32 % (06/16 1940)     Height: 5\' 9"  (175.3 cm) Weight: 69.8 kg BMI (Calculated): 22.71   Physical Exam:  Constitutional: alert, cooperative and no distress  Respiratory: breathing non-labored at rest  Cardiovascular: regular rate and sinus rhythm  Gastrointestinal: soft, non-tender, and mild-distended. Ileostomy pink and patent.  Labs:     Latest Ref Rng & Units 10/27/2022    5:15 AM 10/26/2022    6:15 AM 10/25/2022    5:52 AM  CBC  WBC 4.0 - 10.5 K/uL 24.0  20.1  12.1   Hemoglobin 13.0 - 17.0 g/dL 40.9  81.1  91.4   Hematocrit 39.0 - 52.0 % 47.6  45.3  41.8   Platelets 150 - 400 K/uL 469  427  408       Latest Ref Rng & Units 10/27/2022    5:15 AM 10/26/2022    6:15 AM 10/25/2022    5:52 AM  CMP  Glucose 70 - 99 mg/dL 782  956  213   BUN 8 - 23 mg/dL 32  22  24   Creatinine 0.61 - 1.24 mg/dL 0.86  5.78  4.69   Sodium 135 - 145 mmol/L 137  136  138   Potassium 3.5 - 5.1 mmol/L 4.4  3.9  3.4   Chloride 98 - 111 mmol/L 94  92  93   CO2 22 - 32 mmol/L 32  32  34   Calcium 8.9 - 10.3 mg/dL 9.9  62.9  9.6     Imaging studies: No new pertinent imaging studies   Assessment/Plan:  77 y.o. male with perforated diverticulitis 8 Day Post-Op s/p partial colectomy with anastomosis and loop ileostomy creation, complicated by pertinent comorbidities  including severe COPD, history of bladder cancer, hypertension.   -Adequate vitals signs. No fever.  -Improved abdominal pain exam. Slowly decreasing distention. Adequate ileostomy output.  - Will advance diet to full liquids. - Encourage the patient to ambulate.  - Continue respiratory therapies.    Gae Gallop, MD

## 2022-10-28 NOTE — Consult Note (Addendum)
WOC Nurse ostomy follow-up consult note Stoma type/location: Stoma is red and viable, above skin level, 1 3/4 inches and slightly oval, rod in place.   Stomal assessment/size: Previously noted MARSI has resolved.  Output: 40cc brown liquid stool  Ostomy pouching: 2pc.  Education provided:  Diplomatic Services operational officer used for teaching session with Pt and wife. Wife was able to stretch barrier ring and apply to the back of the wafer, and snap together the 2 piece pouching system.  Pt watched the procedure using a hand held mirror.  Both were able to open and close the Velcro to empty. Discussed dietary precautions and ordering supplies and importance of avoiding dehydration.  Both asked appropriate questions.  5 sets of supplies left at the bedside for staff nurse use as follows: barrier rings, [place] # [phone], wafer [place] # 2, pouch Lawson # [phone] Enrolled patient in [employer] DC program: Yes, previously. Thank-you,  Cammie Mcgee MSN, RN, CWOCN, [place], CNS [phone]

## 2022-10-28 NOTE — Progress Notes (Signed)
Patient ID: Joe Torres, male   DOB: 1945-12-17, 77 y.o.   MRN: [id number] Patient reevaluated.  He continued to feel very uncomfortable and distended.  Normal gas since I saw him at 3 PM.  He agreed to get NGT.  I personally placed the nasogastric tube 18 French in the large nostril.  No complication from procedure.  X-ray ordered.  Will consider doing CT scan of the abdomen and pelvis in the morning.

## 2022-10-28 NOTE — Progress Notes (Signed)
Patient ID: Joe Torres, male   DOB: 01-Jan-1946, 77 y.o.   MRN: [id number] Micah Flesher to see patient since he has been complaining of distention. There was decreased ileostomy output today. Feeling uncomfortable again like Friday night. Discuss with patient about recurrent ileus. Discuss with patient about NGT. I was preparing the canister and everything to put the NGT and patient started having good ileostomy gas. He requested holding NGT for now.   Patient understand and if he develops worsening distention, nausea, vomiting or difficulty breathing, will need NGT placement. Discussed with nurse and to pass it on to night nurse.   Dr. Hazle Quant

## 2022-10-29 ENCOUNTER — Inpatient Hospital Stay: Payer: Medicare HMO

## 2022-10-29 ENCOUNTER — Encounter: Payer: Self-pay | Admitting: General Surgery

## 2022-10-29 ENCOUNTER — Inpatient Hospital Stay: Payer: Medicare HMO | Admitting: Oncology

## 2022-10-29 DIAGNOSIS — K5720 Diverticulitis of large intestine with perforation and abscess without bleeding: Secondary | ICD-10-CM | POA: Diagnosis not present

## 2022-10-29 LAB — CBC
HCT: 42.1 % (ref 39.0–52.0)
Hemoglobin: 13.6 g/dL (ref 13.0–17.0)
MCH: 29.9 pg (ref 26.0–34.0)
MCHC: 32.3 g/dL (ref 30.0–36.0)
MCV: 92.5 fL (ref 80.0–100.0)
Platelets: 351 10*3/uL (ref 150–400)
RBC: 4.55 MIL/uL (ref 4.22–5.81)
RDW: 13.1 % (ref 11.5–15.5)
WBC: 14 10*3/uL — ABNORMAL HIGH (ref 4.0–10.5)
nRBC: 0 % (ref 0.0–0.2)

## 2022-10-29 MED ORDER — IOHEXOL 9 MG/ML PO SOLN
500.0000 mL | ORAL | Status: AC
  Administered 2022-10-29 (×2): 500 mL via ORAL

## 2022-10-29 MED ORDER — IOHEXOL 300 MG/ML  SOLN
100.0000 mL | Freq: Once | INTRAMUSCULAR | Status: AC | PRN
  Administered 2022-10-29: 100 mL via INTRAVENOUS

## 2022-10-29 MED ORDER — DICLOFENAC SODIUM 1 % EX GEL
4.0000 g | Freq: Four times a day (QID) | CUTANEOUS | Status: DC
  Administered 2022-10-29 – 2022-11-06 (×27): 4 g via TOPICAL
  Filled 2022-10-29 (×2): qty 100

## 2022-10-29 NOTE — Progress Notes (Signed)
Patient ID: Joe Torres, male   DOB: 15-Jan-1946, 77 y.o.   MRN: [id number]     SURGICAL PROGRESS NOTE   Hospital Day(s): 9.   Interval History: Patient seen and examined, no acute events or new complaints overnight. Patient reports continues to feel uncomfortable with significant distention.  No passing gas through the ileostomy.  Not able to rest well.  Vital signs in last 24 hours: [min-max] current  Temp:  [98.1 F (36.7 C)-98.3 F (36.8 C)] 98.3 F (36.8 C) (06/17 2319) Pulse Rate:  [92-98] 98 (06/17 2319) Resp:  [18-20] 18 (06/17 2319) BP: (132-145)/(69-80) 132/69 (06/17 2319) SpO2:  [96 %-99 %] 96 % (06/17 2319)     Height: 5\' 9"  (175.3 cm) Weight: 69.8 kg BMI (Calculated): 22.71   Physical Exam:  Constitutional: alert, cooperative and no distress  Respiratory: breathing non-labored at rest  Cardiovascular: regular rate and sinus rhythm  Gastrointestinal: soft, distended.  Ileostomy with edema, patent.  Labs:     Latest Ref Rng & Units 10/29/2022    5:08 AM 10/28/2022    7:09 AM 10/27/2022    5:15 AM  CBC  WBC 4.0 - 10.5 K/uL 14.0  17.3  24.0   Hemoglobin 13.0 - 17.0 g/dL 40.9  81.1  91.4   Hematocrit 39.0 - 52.0 % 42.1  39.9  47.6   Platelets 150 - 400 K/uL 351  348  469       Latest Ref Rng & Units 10/28/2022    7:09 AM 10/27/2022    5:15 AM 10/26/2022    6:15 AM  CMP  Glucose 70 - 99 mg/dL 782  956  213   BUN 8 - 23 mg/dL 20  32  22   Creatinine 0.61 - 1.24 mg/dL 0.86  5.78  4.69   Sodium 135 - 145 mmol/L 137  137  136   Potassium 3.5 - 5.1 mmol/L 4.1  4.4  3.9   Chloride 98 - 111 mmol/L 97  94  92   CO2 22 - 32 mmol/L 31  32  32   Calcium 8.9 - 10.3 mg/dL 8.7  9.9  62.9     Imaging studies: No new pertinent imaging studies   Assessment/Plan:  77 y.o. male with perforated diverticulitis 9 Day Post-Op s/p partial colectomy with anastomosis and loop ileostomy creation, complicated by pertinent comorbidities including severe COPD, history of bladder cancer,  hypertension.   -Again with recurrent ileus.  NGT in place.  No significant improvement after NGT placement.  No significant output.  I personally flushed the NG with air without significant resistance. -No fever.  Improving leukocytosis -Due to the recurrent ileus without significant improvement clinically, I will order CT scan of the abdomen and pelvis for evaluation of any intra-abdominal infection or complication to explain the extended ileus versus obstruction. -Continue NGT to suction -Continue IV hydration -Encourage patient to ambulate  Gae Gallop, MD

## 2022-10-29 NOTE — Progress Notes (Signed)
Patient ID: Joe Torres, male   DOB: Jun 22, 1945, 77 y.o.   MRN: [id number]     SURGICAL PROGRESS NOTE   Hospital Day(s): 9.   Interval History: Patient seen and examined.  He endorses feeling a little bit better compared to this morning.  He endorses that the distention is slowly decreasing.  He endorses that he has passed small amount of gas.  Vitals:   10/29/22 0726 10/29/22 1545  BP: (!) 155/90 (!) 164/97  Pulse: 94 (!) 106  Resp: 18 20  Temp: 97.8 F (36.6 C) 99.3 F (37.4 C)  SpO2: 94% 94%   Abdomen: Distended, mild tender.  Wound dry and clean.  Ileostomy is pink and patent.  I was able to digitalize the proximal limb and I was able to pass the fascia.  I was unable to find any sign of obstruction.  I was unable to completely digitalize the distal limb.  Assessment and plan: Open review of the CT scan there is possible obstruction at the ileostomy site.  Patient slowly passing gas.  Questionable edema versus technical error.  If there is no improvement in the next 24 hours, I discussed with patient and family about possible return to the OR for revision of the ileostomy.  They report they understood and agreed with the plan.   Gae Gallop, MD

## 2022-10-29 NOTE — TOC Progression Note (Signed)
Transition of Care [facility]) - Progression Note    Patient Details  Name: Joe Torres MRN: [id number] Date of Birth: 05-Dec-1945  Transition of [facility]) CM/SW Contact  Marlowe Sax, RN Phone Number: 10/29/2022, 3:24 PM  Clinical Narrative:     The patient is accepted by [facility] for [facility] Remains not medically ready to DC  Due to the recurrent ileus without significant improvement clinically,  CT scan was ordered of the abdomen and pelvis for evaluation of any intra-abdominal infection or complication to explain the extended ileus versus obstruction.   TOC continues to monitor and will assist with Needs   Expected Discharge Plan: Home w Home Health Services Barriers to Discharge: Continued Medical Work up  Expected Discharge Plan and Services   Discharge Planning Services: CM Consult   Living arrangements for the past 2 months: Single Family Home                 DME Arranged: Walker rolling DME Agency: [employer] Date DME Agency Contacted: 10/23/22 Time DME Agency Contacted: [phone] Representative spoke with at DME Agency: Barbara Cower HH Arranged: PT, RN [facility] Agency: [facility] Date [facility] Agency Contacted: 10/23/22 Time HH Agency Contacted: [phone] Representative spoke with at [facility] Agency: [place]   Social Determinants of Health (SDOH) Interventions SDOH Screenings   Food Insecurity: No Food Insecurity (10/20/2022)  Housing: Low Risk  (10/20/2022)  Transportation Needs: No Transportation Needs (10/20/2022)  Utilities: Not At Risk (10/20/2022)  Tobacco Use: Medium Risk (10/29/2022)    Readmission Risk Interventions     No data to display

## 2022-10-29 NOTE — Progress Notes (Signed)
Progress Note   Patient: Joe Torres IHK:[id number] DOB: 11-Feb-1946 DOA: 10/20/2022     9 DOS: the patient was seen and examined on 10/29/2022       Brief hospital course: From HPI "Joe Torres is a 77 y.o. male with medical history significant of chronic respiratory failure on 2 to 3 L, COPD, GERD, hypertension, history of bladder cancer in remission presenting with abdominal pain, perforated diverticulitis, COPD exacerbation, acute on chronic respiratory failure hypoxia.  Patient ports increased work of breathing over the past 4 to 5 days.  Positive wheezing cough, increased sputum production.  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  Positive increased work of breathing despite inhaler use.  No reported sick contacts.  No fevers or chills.  No hemiparesis or confusion.  Patient also ports worsening lower abdominal pain over the past 3 days.  Mild nausea but no vomiting.  No reported diarrhea.  No reported recent dietary changes.  Prior surgical history includes transurethral resection of bladder tumor in 2022 as well as chemoradiation to the area.  Noted prior colonoscopy September 2022 with reported diverticulosis.  Also with hernia repair in 2017. Presented to the ER afebrile, hemodynamically stable, satting 9 9% on 3 L.  White count 12.4, hemoglobin 14.3, platelets 348, creatinine 1.11, urinalysis within normal limits.  CTA of the chest as well as CT of the abdomen pelvis obtained showing severe centrilobular emphysema concerning for COPD as well as small to moderate pneumoperitoneum with concern for perforated sigmoid diverticulitis without abscess.  Dr. Maia Torres with general surgery evaluating patient with Torres for operative repair today."  Hospital course complicated by post-op ileus, now recurrent.  Requiring NG tube for decompression.      Subject / interval history - pt seated edge of bed, wife and daughter at bedside.  He reports  ongoing distention but having some ostomy output this AM.  Breathing he states is not good.  Asks for Voltaren gel for his knee pain.  No other acute complaints.  He had just returned from CT scan.   Assessment and Torres:   Diverticulitis of colon with perforation s/p surgery Noted lower abdominal pain x 2 to 3 days with diverticulitis with perforation on CT scan Patient underwent low anterior resection with primary anastomosis and diverting loop ileostomy creation by surgeon done on 10/20/2022 --Dr. Maia Torres, general surgery following - see their recs --Completed 7 days course IV Zosyn  --CT abdomen/pelvis this AM -- pending, follow results & surgery's recs --Postoperative supportive care, pain control --ostomy teaching / training is underway for family and patient --Continue pain medication as needed -- adjust regimen so IV morphine not needed for adequate relief 6/14: changed oxycodone to oral dilaudid (pt reports feeling more short of breath and tight in his chest after oxycodone) --Advancement of diet according to surgical recommendation -- soft diet today --Simethicone PRN for distention / gas pains  Postoperative Ileus -- Recurrent. NG tube placed today 6/15, was accidentally dislodged overnight. 6/16 - abdomen less distended, good output from ileostomy this AM 6/17 - increased distention, no ostomy output, NGT replaced --Mgmt per general surgery --On Full liquid diet  -- diet per surgery --Gentle maintenance IV fluids   Acute on chronic respiratory failure with hypoxia (HCC) Severe COPD/Emphysema with Acute Exacerbation Acute decompensated respiratory failure in the setting of COPD exacerbation with cough wheezing, increased sputum production.  CT scan chest showed severe emphysema. Baseline  home O2 use around 2-3L.  Stable on 3 L/min here. Completed 5 days course of prednisone 40 mg, resumed on his chronic daily prednisone 10 mg. --6/15 -- resuming IV Solu-medrol 40 mg BID for  recurrent exacerbation --6/17 -- reduce Solu-medrol to 40 md DAILY --Continue neb treatments, Daliresp --Changed albuterol nebs >> Xopenex due to tachycardia --Hold prednisone 10 mg daily   Hypokalemia - replaced 6/12, 6/14. --Monitor BMP, check Mg levels periodically   Malignant neoplasm of overlapping sites of bladder [facility]) Noted prior history of high-grade urothelial carcinoma  s/p concurrent chemoradiation with 5-FU mitomycin ending in March 2022  No evidence of disease per 05/2022 Rad-onc note Continue to monitor closely-outpatient follow-up with oncologist   Hypertension BP stable Resume amlodipine, HCTZ, losartan (when taking PO's) PRN IV hydralazine while NPO Avoiding beta blockers due to severe emphysema/COPD   Hyperlipidemia Hold statin while NPO   GERD (gastroesophageal reflux disease) PPI - currently on IV Protonix daily -- increased to BID given significant symptomatic reflux   BPH (benign prostatic hyperplasia) 6/12-13: pt having issues voiding, weak stream, difficulty initiating stream, sensation of incomplete emptying --Continue Flomax --Monitor post-void residuals by bladder scan --Added Proscar 6/13 6/14: pt reports improved stream with voiding        Advance Care Planning:   Code Status: Full Code    Consults: General Surgery w/ Dr. Maia Torres    Family Communication: Wife and daughter at bedside on AM rounds.        Physical Exam:  General exam: awake, alert, no acute distress HEENT: moist mucus membranes, hearing grossly normal  Respiratory system: severely diminished with mild expiratory wheezing, mildly increased respiratory effort with slight accessory muscle use, O2 sat stable on 3 L/min O2. Cardiovascular system: normal S1/S2, RRR, no pedal edema.   Gastrointestinal system: distended abdomen, ileostomy bag with minimal air and stool output Central nervous system: A&O x 3. no gross focal neurologic deficits, normal speech Extremities: moves all,  no edema, normal tone Skin: dry, intact, normal temperature Psychiatry: normal mood, congruent affect, judgement and insight appear normal        Data Reviewed:  Notable labs --- WBC improved 17.3 >> 14.0       Patient continues to be high risk for morbidity and mortality due to underlying severe emphysema with chronic respiratory failure.      Vitals:   10/28/22 2008 10/28/22 2009 10/28/22 2319 10/29/22 0726  BP:   132/69 (!) 155/90  Pulse:   98 94  Resp:   18 18  Temp:   98.3 F (36.8 C) 97.8 F (36.6 C)  TempSrc:      SpO2: 98% 98% 96% 94%  Weight:      Height:         Author: Pennie Banter, DO 10/29/2022 1:22 PM  For on call review [email or web address].

## 2022-10-29 NOTE — Plan of Care (Signed)
Patient A&Ox4, from home, independent in room  

## 2022-10-29 NOTE — Progress Notes (Signed)
OT Cancellation Note  Patient Details Name: Joe Torres MRN: [id number] DOB: 1946/04/17   Cancelled Treatment:    Reason Eval/Treat Not Completed: Patient at procedure or test/ unavailable. Pt out of the room. Per chart, having abdominal CT completed. Will re-attempt OT tx at later date/time as pt is available.   Arman Filter., MPH, MS, OTR/L ascom [phone] 10/29/22, 10:08 AM

## 2022-10-29 NOTE — Progress Notes (Signed)
Physical Therapy Treatment Patient Details Name: Joe Torres MRN: [id number] DOB: 03-Apr-1946 Today's Date: 10/29/2022   History of Present Illness Mr. Eimers is a 77 year old male with a past medical history of asthma/COPD overlap, who presented to the hospital with abdominal pain. Being treated for diverticulitis of colon with perforation.    PT Comments    Utilized spanish interpreter for session; Leonie Man [phone]. Patient sitting EOB with b/l UE support on table tray, able to sit w.out UE support. Nursing paused NG tube suction and PIV for duration of session, notified nursing of session completion. Sit<>stand supervision assist with RW. Ambulated supervision assist with RW + chair follow, +2 needed for safety/equipment. Patient on 3L of O2 entire session with  spo2 ranging in the 90's, HR elevated to 118 bpm. Mild SOB when ambulating alleviated with rest break. Returned to bed with call bell in reach, family in room. Final Spo2 95%.  Recommendations for follow up therapy are one component of a multi-disciplinary discharge planning process, led by the attending physician.  Recommendations may be updated based on patient status, additional functional criteria and insurance authorization.  Follow Up Recommendations       Assistance Recommended at Discharge Set up Supervision/Assistance  Patient can return home with the following A little help with walking and/or transfers;Assistance with cooking/housework;Assist for transportation;Help with stairs or ramp for entrance;A little help with bathing/dressing/bathroom   Equipment Recommendations  None recommended by PT    Recommendations for Other Services       Precautions / Restrictions Precautions Precautions: Fall Restrictions Weight Bearing Restrictions: No     Mobility  Bed Mobility Overal bed mobility: Modified Independent             General bed mobility comments: Patient independently sitting EOB at begining of session with  tray infront.    Transfers Overall transfer level: Needs assistance Equipment used: Rolling walker (2 wheels) Transfers: Sit to/from Stand Sit to Stand: Supervision           General transfer comment: patient maintained balance with b/l UE hold on RW    Ambulation/Gait Ambulation/Gait assistance: Supervision Gait Distance (Feet): 40 Feet Assistive device: Rolling walker (2 wheels) Gait Pattern/deviations: Step-through pattern       General Gait Details: patient on 3L of O2 during ambulation, maintained Spo2 > 90. mild SOB noted, performed rest break to improve sx.   Stairs             Wheelchair Mobility    Modified Rankin (Stroke Patients Only)       Balance Overall balance assessment: Needs assistance Sitting-balance support: No upper extremity supported, Feet supported Sitting balance-Leahy Scale: Good     Standing balance support: Bilateral upper extremity supported, Reliant on assistive device for balance, During functional activity Standing balance-Leahy Scale: Fair Standing balance comment: increased trunk flexion in standing                            Cognition Arousal/Alertness: Awake/alert Behavior During Therapy: WFL for tasks assessed/performed Overall Cognitive Status: Within Functional Limits for tasks assessed                                 General Comments: Pleasant and willing to participate        Exercises      General Comments        Pertinent Vitals/Pain  Pain Assessment Pain Assessment: PAINAD Breathing: noisy labored breathing, long periods of hyperventilation, Cheyne-Stokes respirations Negative Vocalization: occasional moan/groan, low speech, negative/disapproving quality Facial Expression: facial grimacing Body Language: tense, distressed pacing, fidgeting Consolability: no need to console PAINAD Score: 6 Pain Location: abdomen/ everywhere. Pain Descriptors / Indicators: Discomfort Pain  Intervention(s): Limited activity within patient's tolerance, Monitored during session, Relaxation    Home Living                          Prior Function            PT Goals (current goals can now be found in the care plan section) Acute Rehab PT Goals Patient Stated Goal: to go home PT Goal Formulation: With patient Time For Goal Achievement: 11/04/22 Potential to Achieve Goals: Good Progress towards PT goals: Progressing toward goals    Frequency    Min 3X/week      PT Plan Frequency needs to be updated    Co-evaluation              AM-PAC PT "6 Clicks" Mobility   Outcome Measure  Help needed turning from your back to your side while in a flat bed without using bedrails?: None Help needed moving from lying on your back to sitting on the side of a flat bed without using bedrails?: None Help needed moving to and from a bed to a chair (including a wheelchair)?: None Help needed standing up from a chair using your arms (e.g., wheelchair or bedside chair)?: None Help needed to walk in hospital room?: A Little Help needed climbing 3-5 steps with a railing? : A Little 6 Click Score: 22    End of Session Equipment Utilized During Treatment: Oxygen;Other (comment) (interpreter) Activity Tolerance: Patient limited by fatigue Patient left: in bed;with call bell/phone within reach;with family/visitor present Nurse Communication: Mobility status PT Visit Diagnosis: Other abnormalities of gait and mobility (R26.89);Muscle weakness (generalized) (M62.81)     Time: 1610-9604 PT Time Calculation (min) (ACUTE ONLY): 11 min  Charges:                        Malachi Carl, SPT    Malachi Carl 10/29/2022, 12:09 PM

## 2022-10-30 ENCOUNTER — Inpatient Hospital Stay: Payer: Medicare HMO

## 2022-10-30 DIAGNOSIS — K5720 Diverticulitis of large intestine with perforation and abscess without bleeding: Secondary | ICD-10-CM | POA: Diagnosis not present

## 2022-10-30 LAB — CBC
HCT: 40.8 % (ref 39.0–52.0)
Hemoglobin: 12.9 g/dL — ABNORMAL LOW (ref 13.0–17.0)
MCH: 29.8 pg (ref 26.0–34.0)
MCHC: 31.6 g/dL (ref 30.0–36.0)
MCV: 94.2 fL (ref 80.0–100.0)
Platelets: 299 10*3/uL (ref 150–400)
RBC: 4.33 MIL/uL (ref 4.22–5.81)
RDW: 12.9 % (ref 11.5–15.5)
WBC: 17.8 10*3/uL — ABNORMAL HIGH (ref 4.0–10.5)
nRBC: 0 % (ref 0.0–0.2)

## 2022-10-30 NOTE — Progress Notes (Signed)
Patient ID: Joe Torres, male   DOB: [birth year], 77 y.o.   MRN: [id number]     SURGICAL PROGRESS NOTE   Hospital Day(s): 10.   Interval History: Patient seen and re-examined, no passing gas.  Ileostomy.  Abdomen is softer.  Vital signs in last 24 hours: [min-max] current  Temp:  [98.3 F (36.8 C)-99.3 F (37.4 C)] 98.3 F (36.8 C) (06/19 0817) Pulse Rate:  [97-106] 97 (06/19 1424) Resp:  [16-20] 20 (06/19 1424) BP: (149-164)/(75-97) 160/75 (06/19 0817) SpO2:  [94 %-99 %] 99 % (06/19 1424)     Height: 5\' 9"  (175.3 cm) Weight: 69.8 kg BMI (Calculated): 22.71   Physical Exam:  Constitutional: alert, cooperative and no distress  Gastrointestinal: soft, non-tender, still distended.  Gas and enteric content in ileostomy bag  Assessment/Plan:  As mentioned on previous note, patient continue passing gas and the abdomen is softer.  He is slowly feeling more comfortable.  Will hold surgery for now.  Gae Gallop, MD

## 2022-10-30 NOTE — Progress Notes (Addendum)
This RN called to patient's room. Upon arrival it was discovered that patient's second NG tube had been dislodged. Surgeon notified, waiting on response.  New NG tube placed in right nare connected to intermittent suction. Placement x-ray ordered.

## 2022-10-30 NOTE — Progress Notes (Signed)
Progress Note   Patient: Joe Torres ZOX:[id number] DOB: [birth year] DOA: 10/20/2022     10 DOS: the patient was seen and examined on 10/30/2022    Subjective: Patient seen and examined at bedside this morning Was found to have some evidence of shortness of breath Also using accessory muscles of respiration Denies chest pain Patient now has NG tube in place on account of paralytic ileus CT scan of the abdomen have been reviewed by me showing findings suggestive of simple severe small bowel obstruction Surgeon planning on possible surgical intervention again According to the wife present at bedside patient had increased work of breathing within the last 12 to 18 hours I ordered stat chest x-ray reviewed by me did not show any evidence of pleural effusion or infiltrate to suggest pneumonia  Brief hospital course: From HPI "KACIE KRISTIANSEN is a 77 y.o. male with medical history significant of chronic respiratory failure on 2 to 3 L, COPD, GERD, hypertension, history of bladder cancer in remission presenting with abdominal pain, perforated diverticulitis, COPD exacerbation, acute on chronic respiratory failure hypoxia.  Patient ports increased work of breathing over the past 4 to 5 days.  Positive wheezing cough, increased sputum production.  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  Positive increased work of breathing despite inhaler use.  No reported sick contacts.  No fevers or chills.  No hemiparesis or confusion.  Patient also ports worsening lower abdominal pain over the past 3 days.  Mild nausea but no vomiting.  No reported diarrhea.  No reported recent dietary changes.  Prior surgical history includes transurethral resection of bladder tumor in 2022 as well as chemoradiation to the area.  Noted prior colonoscopy September 2022 with reported diverticulosis.  Also with hernia repair in 2017. Presented to the ER afebrile, hemodynamically  stable, satting 9 9% on 3 L.  White count 12.4, hemoglobin 14.3, platelets 348, creatinine 1.11, urinalysis within normal limits.  CTA of the chest as well as CT of the abdomen pelvis obtained showing severe centrilobular emphysema concerning for COPD as well as small to moderate pneumoperitoneum with concern for perforated sigmoid diverticulitis without abscess.  Dr. Maia Plan with general surgery evaluating patient with plan for operative repair today."   Hospital course complicated by post-op ileus, now recurrent.  Requiring NG tube for decompression.         Assessment and Plan:   Diverticulitis of colon with perforation s/p surgery Noted lower abdominal pain x 2 to 3 days with diverticulitis with perforation on CT scan Patient underwent low anterior resection with primary anastomosis and diverting loop ileostomy creation by surgeon done on 10/20/2022 --Dr. Maia Plan, general surgery following - see their recs --Completed 7 days course IV Zosyn  --I Reviewed CT scan of the abdomen results showing severe small bowel obstruction --Postoperative supportive care, pain control --ostomy teaching / training is underway for family and patient --Continue pain medication as needed -- adjust regimen so IV morphine not needed for adequate relief 6/14: changed oxycodone to oral dilaudid (pt reports feeling more short of breath and tight in his chest after oxycodone) Advancement of diet according to surgical recs Patient currently has NG tube in place for gastric decompression in the setting of paralytic ileus   Postoperative Ileus -- Recurrent. Patient now has NG tube in place on account of paralytic ileus CT scan of the abdomen have been reviewed by me showing findings suggestive of  simple severe small bowel obstruction Surgeon planning on possible surgical intervention again    Acute on chronic respiratory failure with hypoxia (HCC) Severe COPD/Emphysema with Acute Exacerbation Acute decompensated  respiratory failure in the setting of COPD exacerbation with cough wheezing, increased sputum production.  CT scan chest showed severe emphysema. Baseline home O2 use around 2-3L.  Stable on 3 L/min here. Patient completed 5 days course of 40 mg prednisone and then was initiated on IV Solu-Medrol 40 twice daily and currently on 40 daily I ordered stat chest x-ray reviewed by me did not show any evidence of pleural effusion or infiltrate to suggest pneumonia --Continue neb treatments, Daliresp --Changed albuterol nebs >> Xopenex due to tachycardia -- Continue to hold prednisone 10 mg daily    Hypokalemia - replaced 6/12, 6/14. --Monitor BMP, check Mg levels periodically   Malignant neoplasm of overlapping sites of bladder [facility]) Noted prior history of high-grade urothelial carcinoma  s/p concurrent chemoradiation with 5-FU mitomycin ending in March 2022  No evidence of disease per 05/2022 Rad-onc note Continue to monitor closely-outpatient follow-up with oncologist   Hypertension BP stable Resume amlodipine, HCTZ, losartan (when taking PO's) PRN IV hydralazine while NPO Avoiding beta blockers due to severe emphysema/COPD   Hyperlipidemia Hold statin while NPO   GERD (gastroesophageal reflux disease) PPI - currently on IV Protonix daily -- increased to BID given significant symptomatic reflux   BPH (benign prostatic hyperplasia) 6/12-13: pt having issues voiding, weak stream, difficulty initiating stream, sensation of incomplete emptying --Continue Flomax --Monitor post-void residuals by bladder scan --Added Proscar 6/13 6/14: pt reports improved stream with voiding        Advance Care Planning:   Code Status: Full Code    Consults: General Surgery w/ Dr. Maia Plan    Family Communication: Wife and daughter at bedside on AM rounds.        Physical Exam:   General exam: Awake in some distress requiring intranasal oxygen HEENT: moist mucus membranes, hearing grossly normal   Respiratory system: Decreased air entry bilaterally with wheezing Cardiovascular system: normal S1/S2, RRR, no pedal edema.   Gastrointestinal system: Distended with some tenderness noted at surgical incision sites as well as ileostomy site Central nervous system: A&O x 3. no gross focal neurologic deficits, normal speech Extremities: moves all, no edema, normal tone Skin: dry, intact, normal temperature Psychiatry: normal mood, congruent affect, judgement and insight appear normal         Data Reviewed: Labs reviewed showing WBC 17.8 likely increased due to steroid effect.     Patient continues to be high risks of morbidity and mortality given underlying SEVERE emphysema with worsening respiratory function as well as a bowel obstruction       Vitals:   10/30/22 0722 10/30/22 0817 10/30/22 1424 10/30/22 1539  BP:  (!) 160/75  (!) 155/78  Pulse: 98 (!) 101 97 92  Resp: 16 20 20 18   Temp:  98.3 F (36.8 C)  98.1 F (36.7 C)  TempSrc:      SpO2: 98% 97% 99% 96%  Weight:      Height:         Author: Loyce Dys, MD 10/30/2022 5:31 PM  For on call review [email or web address].

## 2022-10-30 NOTE — Plan of Care (Signed)
Patient A&Ox4, from home, up with assist in room. Patient's NG tube was dislodged after using the bathroom tonight. New NG placed, confirmation x-ray ordered.

## 2022-10-30 NOTE — Plan of Care (Signed)

## 2022-10-30 NOTE — Progress Notes (Signed)
Patient ID: Joe Torres, male   DOB: 1945-06-05, 77 y.o.   MRN: [id number]     SURGICAL PROGRESS NOTE   Hospital Day(s): 10.   Interval History: Patient seen and examined, no acute events or new complaints overnight. Patient reports feeling better this morning. Patient has some intestinal output in the ostomy bag.  Vital signs in last 24 hours: [min-max] current  Temp:  [97.8 F (36.6 C)-99.3 F (37.4 C)] 98.3 F (36.8 C) (06/18 2353) Pulse Rate:  [94-106] 101 (06/18 2353) Resp:  [18-20] 18 (06/18 2353) BP: (149-164)/(90-97) 149/97 (06/18 2353) SpO2:  [94 %-97 %] 97 % (06/18 2353)     Height: 5\' 9"  (175.3 cm) Weight: 69.8 kg BMI (Calculated): 22.71   Physical Exam:  Constitutional: alert, cooperative and no distress  Respiratory: breathing non-labored at rest  Cardiovascular: regular rate and sinus rhythm  Gastrointestinal: soft, non-tender, but distended. Ileostomy with edema with stool and air in bag.   Labs:     Latest Ref Rng & Units 10/30/2022    5:41 AM 10/29/2022    5:08 AM 10/28/2022    7:09 AM  CBC  WBC 4.0 - 10.5 K/uL 17.8  14.0  17.3   Hemoglobin 13.0 - 17.0 g/dL 78.2  95.6  21.3   Hematocrit 39.0 - 52.0 % 40.8  42.1  39.9   Platelets 150 - 400 K/uL 299  351  348       Latest Ref Rng & Units 10/28/2022    7:09 AM 10/27/2022    5:15 AM 10/26/2022    6:15 AM  CMP  Glucose 70 - 99 mg/dL 086  578  469   BUN 8 - 23 mg/dL 20  32  22   Creatinine 0.61 - 1.24 mg/dL 6.29  5.28  4.13   Sodium 135 - 145 mmol/L 137  137  136   Potassium 3.5 - 5.1 mmol/L 4.1  4.4  3.9   Chloride 98 - 111 mmol/L 97  94  92   CO2 22 - 32 mmol/L 31  32  32   Calcium 8.9 - 10.3 mg/dL 8.7  9.9  24.4     Imaging studies: No new pertinent imaging studies   Assessment/Plan:  77 y.o. male with perforated diverticulitis 10 Day Post-Op s/p partial colectomy with anastomosis and loop ileostomy creation, complicated by pertinent comorbidities including severe COPD, history of bladder cancer,  hypertension.  -Today with some stool in the ileostomy bag. Still distended but somewhat decreased and more comfortable. Will order xray and follow up during the day to see if there is continuous ostomy output. If not significant, will still consider taking him for ostomy revision but if output continue to increase, will hold surgical intervention.  -Continue NGT, NPO - Encourage to ambulate - Appreciate Hospitalist to continue optimizing pulmonary disease.    Gae Gallop, MD

## 2022-10-30 NOTE — TOC Progression Note (Signed)
Transition of Care [facility]) - Progression Note    Patient Details  Name: Joe Torres MRN: [id number] Date of Birth: 1945-06-12  Transition of Care [facility]) CM/SW Contact  Marlowe Sax, RN Phone Number: 10/30/2022, 3:13 PM  Clinical Narrative:    TOC continues to follow for needs Continue NGT, NPO Encourage to ambulate Has stool in ostomy bag, will put off surgery  Expected Discharge Plan: Home w Home Health Services Barriers to Discharge: Continued Medical Work up  Expected Discharge Plan and Services   Discharge Planning Services: CM Consult   Living arrangements for the past 2 months: Single Family Home                 DME Arranged: Dan Humphreys rolling DME Agency: [employer] Date DME Agency Contacted: 10/23/22 Time DME Agency Contacted: [phone] Representative spoke with at DME Agency: Barbara Cower HH Arranged: PT, RN [facility] Agency: [facility] Date [facility] Agency Contacted: 10/23/22 Time HH Agency Contacted: [phone] Representative spoke with at [facility] Agency: [place]   Social Determinants of Health (SDOH) Interventions SDOH Screenings   Food Insecurity: No Food Insecurity (10/20/2022)  Housing: Low Risk  (10/20/2022)  Transportation Needs: No Transportation Needs (10/20/2022)  Utilities: Not At Risk (10/20/2022)  Tobacco Use: Medium Risk (10/29/2022)    Readmission Risk Interventions     No data to display

## 2022-10-30 NOTE — Progress Notes (Signed)
PT Cancellation Note  Patient Details Name: Joe Torres MRN: [id number] DOB: 1945/10/10   Cancelled Treatment:    Reason Eval/Treat Not Completed: Fatigue/lethargy limiting ability to participate Attempted to see patient at 9:39 with an interpreter, Shari Prows [id number]. Patient stated that he was feeling very bad today and too tired too move. Wife said he was expecting an Xray today and to maybe try and come back later.   Malachi Carl, SPT  Malachi Carl 10/30/2022, 9:49 AM

## 2022-10-31 ENCOUNTER — Inpatient Hospital Stay: Payer: Medicare HMO

## 2022-10-31 DIAGNOSIS — K5720 Diverticulitis of large intestine with perforation and abscess without bleeding: Secondary | ICD-10-CM | POA: Diagnosis not present

## 2022-10-31 LAB — CBC WITH DIFFERENTIAL/PLATELET
Abs Immature Granulocytes: 0.27 10*3/uL — ABNORMAL HIGH (ref 0.00–0.07)
Basophils Absolute: 0.1 10*3/uL (ref 0.0–0.1)
Basophils Relative: 0 %
Eosinophils Absolute: 0 10*3/uL (ref 0.0–0.5)
Eosinophils Relative: 0 %
HCT: 40.6 % (ref 39.0–52.0)
Hemoglobin: 12.9 g/dL — ABNORMAL LOW (ref 13.0–17.0)
Immature Granulocytes: 2 %
Lymphocytes Relative: 2 %
Lymphs Abs: 0.3 10*3/uL — ABNORMAL LOW (ref 0.7–4.0)
MCH: 30.1 pg (ref 26.0–34.0)
MCHC: 31.8 g/dL (ref 30.0–36.0)
MCV: 94.9 fL (ref 80.0–100.0)
Monocytes Absolute: 1.2 10*3/uL — ABNORMAL HIGH (ref 0.1–1.0)
Monocytes Relative: 7 %
Neutro Abs: 15.9 10*3/uL — ABNORMAL HIGH (ref 1.7–7.7)
Neutrophils Relative %: 89 %
Platelets: 277 10*3/uL (ref 150–400)
RBC: 4.28 MIL/uL (ref 4.22–5.81)
RDW: 13 % (ref 11.5–15.5)
WBC: 17.8 10*3/uL — ABNORMAL HIGH (ref 4.0–10.5)
nRBC: 0 % (ref 0.0–0.2)

## 2022-10-31 LAB — BASIC METABOLIC PANEL
Anion gap: 8 (ref 5–15)
BUN: 18 mg/dL (ref 8–23)
CO2: 34 mmol/L — ABNORMAL HIGH (ref 22–32)
Calcium: 8.7 mg/dL — ABNORMAL LOW (ref 8.9–10.3)
Chloride: 100 mmol/L (ref 98–111)
Creatinine, Ser: 0.63 mg/dL (ref 0.61–1.24)
GFR, Estimated: >60 mL/min (ref >60)
Glucose, Bld: 100 mg/dL — ABNORMAL HIGH (ref 70–99)
Potassium: 3.6 mmol/L (ref 3.5–5.1)
Sodium: 142 mmol/L (ref 135–145)

## 2022-10-31 MED ORDER — PHENOL 1.4 % MT LIQD
1.0000 | OROMUCOSAL | Status: DC | PRN
  Administered 2022-11-02: 1 via OROMUCOSAL
  Filled 2022-10-31: qty 177

## 2022-10-31 NOTE — Progress Notes (Addendum)
X-ray result showed BG tube in distal esophagus but coiled on itself requiring this RN to withdraw and reinsert. Additional placement confirmation x-ray ordered.  Second attempt to insert NG tube unsuccessful according to x-ray result. NG tube once again re-inserted and placement x-ray ordered.  Placement verified in stomach. Intermittent suction restarted.

## 2022-10-31 NOTE — Progress Notes (Signed)
OT Cancellation Note  Patient Details Name: Joe Torres MRN: [id number] DOB: 02/14/1946   Cancelled Treatment:    Reason Eval/Treat Not Completed: Patient declined, no reason specified. Pt supine in bed with multiple family members present. Pt politely declines OT intervention this afternoon and reports having worked with PT recently. OT to re-attempt at next available time.   Jackquline Denmark, MS, OTR/L , CBIS ascom [phone]  10/31/22, 3:09 PM

## 2022-10-31 NOTE — Progress Notes (Signed)
Physical Therapy Treatment Patient Details Name: Joe Torres MRN: [id number] DOB: [birth year] Today's Date: 10/31/2022   History of Present Illness Joe Torres is a 77 year old male with a past medical history of asthma/COPD overlap, who presented to the hospital with abdominal pain. Being treated for diverticulitis of colon with perforation.    PT Comments    Pt was sitting EOB with RN + family members at bedside. He agrees to session and is cooperative throughout. Used inhaler prior to ambulating in hallway. Overall less SOB today but continues to be limited by fatigue/ poor activity tolerance. Pt does not ambulate community distances at baseline but is able to tolerate slightly more activity than current state. Recommend continued skilled PT at DC to maximize his independence and safety with all ADLs.    Recommendations for follow up therapy are one component of a multi-disciplinary discharge planning process, led by the attending physician.  Recommendations may be updated based on patient status, additional functional criteria and insurance authorization.     Assistance Recommended at Discharge Set up Supervision/Assistance  Patient can return home with the following A little help with walking and/or transfers;Assistance with cooking/housework;Assist for transportation;Help with stairs or ramp for entrance;A little help with bathing/dressing/bathroom   Equipment Recommendations  None recommended by PT       Precautions / Restrictions Precautions Precautions: Fall Precaution Comments: O2 Restrictions Weight Bearing Restrictions: No     Mobility  Bed Mobility  General bed mobility comments: pt was seated EOB pre/post session    Transfers Overall transfer level: Needs assistance Equipment used: Rolling walker (2 wheels) Transfers: Sit to/from Stand Sit to Stand: Supervision     Ambulation/Gait Ambulation/Gait assistance: Supervision Gait Distance (Feet): 100 Feet Assistive  device: Rolling walker (2 wheels) Gait Pattern/deviations: Step-through pattern Gait velocity: decreased  General Gait Details: Distance limited by pt fatigue. Does not ambulate community distances at baseline. Pt endorses feeling too SOB to continue however SOB much improved from last time observed by author ~ 5 days earlier.    Balance Overall balance assessment: Needs assistance Sitting-balance support: No upper extremity supported, Feet supported Sitting balance-Leahy Scale: Good     Standing balance support: Bilateral upper extremity supported, Reliant on assistive device for balance, During functional activity Standing balance-Leahy Scale: Good       Cognition Arousal/Alertness: Awake/alert Behavior During Therapy: WFL for tasks assessed/performed Overall Cognitive Status: Within Functional Limits for tasks assessed      General Comments: Pleasant and willing to participate. Rn in room, pt used inhaler several times prior to session.           General Comments General comments (skin integrity, edema, etc.): Reviewed importance of routine mobility and getting up ambulating more frequently versus longer distances.      Pertinent Vitals/Pain Pain Assessment Pain Assessment: No/denies pain Pain Score: 0-No pain     PT Goals (current goals can now be found in the care plan section) Acute Rehab PT Goals Patient Stated Goal: to go home Progress towards PT goals: Progressing toward goals    Frequency    Min 3X/week      PT Plan Current plan remains appropriate       AM-PAC PT "6 Clicks" Mobility   Outcome Measure  Help needed turning from your back to your side while in a flat bed without using bedrails?: None Help needed moving from lying on your back to sitting on the side of a flat bed without using bedrails?: None Help  needed moving to and from a bed to a chair (including a wheelchair)?: None Help needed standing up from a chair using your arms (e.g.,  wheelchair or bedside chair)?: None Help needed to walk in hospital room?: A Little Help needed climbing 3-5 steps with a railing? : A Little 6 Click Score: 22    End of Session Equipment Utilized During Treatment: Oxygen Activity Tolerance: Patient tolerated treatment well;Patient limited by fatigue Patient left: Other (comment) (seated EOB with family and RN in room.) Nurse Communication: Mobility status PT Visit Diagnosis: Other abnormalities of gait and mobility (R26.89);Muscle weakness (generalized) (M62.81)     Time: 1244-1300 PT Time Calculation (min) (ACUTE ONLY): 16 min  Charges:  $Gait Training: 8-22 mins                    Jetta Lout PTA 10/31/22, 3:01 PM

## 2022-10-31 NOTE — Progress Notes (Signed)
Patient ID: Joe Torres, male   DOB: 10/24/1945, 77 y.o.   MRN: [id number]     SURGICAL PROGRESS NOTE   Hospital Day(s): 11.   Interval History: Patient seen and examined, no acute events or new complaints overnight. Patient reports feeling better this morning.  Patient endorses passing a lot of stool and gas through the ileostomy.  Abdominal pain improved.  Vital signs in last 24 hours: [min-max] current  Temp:  [98 F (36.7 C)-98.3 F (36.8 C)] 98 F (36.7 C) (06/20 0035) Pulse Rate:  [92-101] 93 (06/20 0035) Resp:  [16-20] 20 (06/20 0035) BP: (146-160)/(75-83) 146/83 (06/20 0035) SpO2:  [96 %-100 %] 100 % (06/20 0035) FiO2 (%):  [32 %] 32 % (06/19 2124)     Height: 5\' 9"  (175.3 cm) Weight: 69.8 kg BMI (Calculated): 22.71   Physical Exam:  Constitutional: alert, cooperative and no distress  Respiratory: breathing non-labored at rest  Cardiovascular: regular rate and sinus rhythm  Gastrointestinal: soft, non-tender, and mild-distended.  Ileostomy is pink and patent with abundant amount of enteric content in the bag.  Labs:     Latest Ref Rng & Units 10/31/2022    4:46 AM 10/30/2022    5:41 AM 10/29/2022    5:08 AM  CBC  WBC 4.0 - 10.5 K/uL 17.8  17.8  14.0   Hemoglobin 13.0 - 17.0 g/dL 78.2  95.6  21.3   Hematocrit 39.0 - 52.0 % 40.6  40.8  42.1   Platelets 150 - 400 K/uL 277  299  351       Latest Ref Rng & Units 10/31/2022    4:46 AM 10/28/2022    7:09 AM 10/27/2022    5:15 AM  CMP  Glucose 70 - 99 mg/dL 086  578  469   BUN 8 - 23 mg/dL 18  20  32   Creatinine 0.61 - 1.24 mg/dL 6.29  5.28  4.13   Sodium 135 - 145 mmol/L 142  137  137   Potassium 3.5 - 5.1 mmol/L 3.6  4.1  4.4   Chloride 98 - 111 mmol/L 100  97  94   CO2 22 - 32 mmol/L 34  31  32   Calcium 8.9 - 10.3 mg/dL 8.7  8.7  9.9     Imaging studies: No new pertinent imaging studies   Assessment/Plan:  77 y.o. male with perforated diverticulitis 11 Day Post-Op s/p partial colectomy with anastomosis and loop  ileostomy creation, complicated by pertinent comorbidities including severe COPD, history of bladder cancer, hypertension.   -Obstruction on the ileostomy site seems to be resolving.  There is a moderate amount of enteric content and gas in the ileostomy bag.  The abdomen is softer and significantly less distended.  Patient feeling more comfortable. -Will clamp NGT and try clear liquid diet again. -Encouraged the patient to ambulate -Continue management of severe COPD by hospitalist  Gae Gallop, MD

## 2022-10-31 NOTE — Progress Notes (Signed)
Progress Note   Patient: Joe Torres WUJ:[id number] DOB: 06-Feb-1946 DOA: 10/20/2022     11 DOS: the patient was seen and examined on 10/31/2022    Subjective: Patient seen in the presence of the family Respiratory function much improved today Denies worsening abdominal pain chest pain or cough  Brief hospital course: From HPI "Joe Torres is a 77 y.o. male with medical history significant of chronic respiratory failure on 2 to 3 L, COPD, GERD, hypertension, history of bladder cancer in remission presenting with abdominal pain, perforated diverticulitis, COPD exacerbation, acute on chronic respiratory failure hypoxia.  Patient ports increased work of breathing over the past 4 to 5 days.  Positive wheezing cough, increased sputum production.  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  Positive increased work of breathing despite inhaler use.  No reported sick contacts.  No fevers or chills.  No hemiparesis or confusion.  Patient also ports worsening lower abdominal pain over the past 3 days.  Mild nausea but no vomiting.  No reported diarrhea.  No reported recent dietary changes.  Prior surgical history includes transurethral resection of bladder tumor in 2022 as well as chemoradiation to the area.  Noted prior colonoscopy September 2022 with reported diverticulosis.  Also with hernia repair in 2017. Presented to the ER afebrile, hemodynamically stable, satting 9 9% on 3 L.  White count 12.4, hemoglobin 14.3, platelets 348, creatinine 1.11, urinalysis within normal limits.  CTA of the chest as well as CT of the abdomen pelvis obtained showing severe centrilobular emphysema concerning for COPD as well as small to moderate pneumoperitoneum with concern for perforated sigmoid diverticulitis without abscess.  Dr. Maia Plan with general surgery evaluating patient with plan for operative repair today."   Hospital course complicated by post-op ileus, now  recurrent.  Requiring NG tube for decompression.          Assessment and Plan:   Diverticulitis of colon with perforation s/p surgery Noted lower abdominal pain x 2 to 3 days with diverticulitis with perforation on CT scan Patient underwent low anterior resection with primary anastomosis and diverting loop ileostomy creation by surgeon done on 10/20/2022 --Dr. Maia Plan, general surgery following - see their recs --Completed 7 days course IV Zosyn  --I Reviewed CT scan of the abdomen results showing severe small bowel obstruction --Postoperative supportive care, pain control --ostomy teaching / training is underway for family and patient --Continue pain medication as needed -- adjust regimen so IV morphine not needed for adequate relief 6/14: changed oxycodone to oral dilaudid (pt reports feeling more short of breath and tight in his chest after oxycodone) Still continues to have NG tube disconnected from suction Surgeon initiated clear liquid diet today Plan of care discussed with surgeon   Postoperative Ileus -- Recurrent. Patient now has NG tube in place on account of paralytic ileus CT scan of the abdomen have been reviewed by me showing findings suggestive of simple severe small bowel obstruction Surgeon on board and monitoring Ileus closely     Acute on chronic respiratory failure with hypoxia (HCC) Severe COPD/Emphysema with Acute Exacerbation Acute decompensated respiratory failure in the setting of COPD exacerbation with cough wheezing, increased sputum production.  CT scan chest showed severe emphysema. Baseline home O2 use around 2-3L.  Stable on 3 L/min here. Patient completed 5 days course of 40 mg prednisone and then was initiated on IV Solu-Medrol 40 twice daily and currently on  40 daily Continue steroid therapy Continue as needed nebulization --Continue neb treatments, Daliresp --Changed albuterol nebs >> Xopenex due to tachycardia -- Continue to hold prednisone 10 mg  daily    Hypokalemia - replaced 6/12, 6/14. Resolved We will monitor BMP closely   Malignant neoplasm of overlapping sites of bladder [name]) Noted prior history of high-grade urothelial carcinoma  s/p concurrent chemoradiation with 5-FU mitomycin ending in March 2022  No evidence of disease per 05/2022 Rad-onc note Continue to monitor closely-outpatient follow-up with oncologist   Hypertension BP stable Continue amlodipine hydrochlorothiazide and losartan when able to take p.o. meds PRN IV hydralazine while NPO Avoiding beta-blockers due to severe emphysema/COPD   Hyperlipidemia Hold statin while NPO   GERD (gastroesophageal reflux disease) PPI - currently on IV Protonix daily -- increased to BID given significant symptomatic reflux   BPH (benign prostatic hyperplasia) 6/12-13: pt having issues voiding, weak stream, difficulty initiating stream, sensation of incomplete emptying Continue Flomax --Monitor post-void residuals by bladder scan --Added Proscar 6/13 6/14: pt reports improved stream with voiding        Advance Care Planning:   Code Status: Full Code    Consults: General Surgery w/ Dr. Maia Plan    Family Communication: Plan of care DISCUSSED with patient's family present at bedside      Physical Exam:   General exam: Awake in some distress requiring intranasal oxygen HEENT: moist mucus membranes, hearing grossly normal  Respiratory system: Wheezing much improved today Cardiovascular system: normal S1/S2, RRR, no pedal edema.   Gastrointestinal system: Abdominal distention better today, ileostomy site without erythema Central nervous system: A&O x 3. no gross focal neurologic deficits, normal speech Extremities: moves all, no edema, normal tone Skin: dry, intact, normal temperature Psychiatry: normal mood, congruent affect, judgement and insight appear normal         Data Reviewed: Reviewed patient's labs showing leukocytosis likely steroid-induced, sodium  142 potassium 3.6 creatinine 0.6   Patient continues to be high risks given several underlying medical condition with severe COPD with emphysema    Vitals:   10/31/22 0746 10/31/22 0846 10/31/22 1418 10/31/22 1510  BP:  (!) 159/99  (!) 153/76  Pulse: 93 94 90 92  Resp: 20 17 16 18   Temp:  98.7 F (37.1 C)  98.6 F (37 C)  TempSrc:      SpO2: 98% 98% 96% 95%  Weight:      Height:         Author: Loyce Dys, MD 10/31/2022 5:26 PM  For on call review [email or web address].

## 2022-10-31 NOTE — TOC Progression Note (Signed)
Transition of Care [facility]) - Progression Note    Patient Details  Name: RUSTON RAPOZO MRN: [id number] Date of Birth: 1945/09/06  [facility]) CM/SW Contact  Marlowe Sax, RN Phone Number: 10/31/2022, 10:42 AM  Clinical Narrative:    TOC continues to follow the patient for DC planning and needs Will clamp NGT and try clear liquid diet again. Encouraged the patient to ambulate [facility] services RW in room   Expected Discharge Plan: Home w Home Health Services Barriers to Discharge: Continued Medical Work up  Expected Discharge Plan and Services   Discharge Planning Services: CM Consult   Living arrangements for the past 2 months: Single Family Home                 DME Arranged: Walker rolling DME Agency: [employer] Date DME Agency Contacted: 10/23/22 Time DME Agency Contacted: [phone] Representative spoke with at DME Agency: Barbara Cower HH Arranged: PT, RN [facility] Agency: [facility] Date [facility] Agency Contacted: 10/23/22 Time HH Agency Contacted: [phone] Representative spoke with at [facility] Agency: [place]   Social Determinants of Health (SDOH) Interventions SDOH Screenings   Food Insecurity: No Food Insecurity (10/20/2022)  Housing: Low Risk  (10/20/2022)  Transportation Needs: No Transportation Needs (10/20/2022)  Utilities: Not At Risk (10/20/2022)  Tobacco Use: Medium Risk (10/29/2022)    Readmission Risk Interventions     No data to display

## 2022-11-01 DIAGNOSIS — K5720 Diverticulitis of large intestine with perforation and abscess without bleeding: Secondary | ICD-10-CM | POA: Diagnosis not present

## 2022-11-01 LAB — CBC WITH DIFFERENTIAL/PLATELET
Abs Immature Granulocytes: 0.16 10*3/uL — ABNORMAL HIGH (ref 0.00–0.07)
Basophils Absolute: 0 10*3/uL (ref 0.0–0.1)
Basophils Relative: 0 %
Eosinophils Absolute: 0 10*3/uL (ref 0.0–0.5)
Eosinophils Relative: 0 %
HCT: 40 % (ref 39.0–52.0)
Hemoglobin: 12.5 g/dL — ABNORMAL LOW (ref 13.0–17.0)
Immature Granulocytes: 1 %
Lymphocytes Relative: 2 %
Lymphs Abs: 0.4 10*3/uL — ABNORMAL LOW (ref 0.7–4.0)
MCH: 29.7 pg (ref 26.0–34.0)
MCHC: 31.3 g/dL (ref 30.0–36.0)
MCV: 95 fL (ref 80.0–100.0)
Monocytes Absolute: 1.1 10*3/uL — ABNORMAL HIGH (ref 0.1–1.0)
Monocytes Relative: 6 %
Neutro Abs: 15.1 10*3/uL — ABNORMAL HIGH (ref 1.7–7.7)
Neutrophils Relative %: 91 %
Platelets: 243 10*3/uL (ref 150–400)
RBC: 4.21 MIL/uL — ABNORMAL LOW (ref 4.22–5.81)
RDW: 13.1 % (ref 11.5–15.5)
WBC: 16.8 10*3/uL — ABNORMAL HIGH (ref 4.0–10.5)
nRBC: 0 % (ref 0.0–0.2)

## 2022-11-01 LAB — BASIC METABOLIC PANEL
Anion gap: 6 (ref 5–15)
BUN: 12 mg/dL (ref 8–23)
CO2: 38 mmol/L — ABNORMAL HIGH (ref 22–32)
Calcium: 8.6 mg/dL — ABNORMAL LOW (ref 8.9–10.3)
Chloride: 95 mmol/L — ABNORMAL LOW (ref 98–111)
Creatinine, Ser: 0.61 mg/dL (ref 0.61–1.24)
GFR, Estimated: >60 mL/min (ref >60)
Glucose, Bld: 115 mg/dL — ABNORMAL HIGH (ref 70–99)
Potassium: 3.3 mmol/L — ABNORMAL LOW (ref 3.5–5.1)
Sodium: 139 mmol/L (ref 135–145)

## 2022-11-01 MED ORDER — ONDANSETRON HCL 4 MG/2ML IJ SOLN: 4.0000 mg | Freq: Four times a day (QID) | INTRAMUSCULAR | Status: DC | PRN

## 2022-11-01 MED ORDER — LORATADINE 10 MG PO TABS
10.0000 mg | ORAL_TABLET | Freq: Every day | ORAL | Status: DC
  Administered 2022-11-02 – 2022-11-07 (×5): 10 mg
  Filled 2022-11-01 (×5): qty 1

## 2022-11-01 MED ORDER — POLYETHYLENE GLYCOL 3350 17 G PO PACK
17.0000 g | PACK | Freq: Two times a day (BID) | ORAL | Status: DC
  Filled 2022-11-01: qty 1

## 2022-11-01 MED ORDER — AMLODIPINE BESYLATE 10 MG PO TABS
10.0000 mg | ORAL_TABLET | Freq: Every day | ORAL | Status: DC
  Administered 2022-11-01 – 2022-11-06 (×6): 10 mg
  Filled 2022-11-01 (×6): qty 1

## 2022-11-01 MED ORDER — ACETAMINOPHEN 500 MG PO TABS
1000.0000 mg | ORAL_TABLET | Freq: Three times a day (TID) | ORAL | Status: DC
  Administered 2022-11-01 – 2022-11-07 (×16): 1000 mg
  Filled 2022-11-01 (×16): qty 2

## 2022-11-01 MED ORDER — POTASSIUM CHLORIDE 20 MEQ PO PACK
40.0000 meq | PACK | ORAL | Status: AC
  Administered 2022-11-01 (×2): 40 meq via ORAL
  Filled 2022-11-01 (×2): qty 2

## 2022-11-01 MED ORDER — HYDROMORPHONE HCL 2 MG PO TABS
2.0000 mg | ORAL_TABLET | ORAL | Status: DC | PRN
  Administered 2022-11-06: 2 mg
  Filled 2022-11-01 (×2): qty 1

## 2022-11-01 MED ORDER — MONTELUKAST SODIUM 10 MG PO TABS
10.0000 mg | ORAL_TABLET | Freq: Every day | ORAL | Status: DC
  Administered 2022-11-01 – 2022-11-06 (×6): 10 mg
  Filled 2022-11-01 (×6): qty 1

## 2022-11-01 MED ORDER — ROFLUMILAST 500 MCG PO TABS
500.0000 ug | ORAL_TABLET | Freq: Every day | ORAL | Status: DC
  Administered 2022-11-02 – 2022-11-07 (×5): 500 ug
  Filled 2022-11-01 (×7): qty 1

## 2022-11-01 MED ORDER — POLYETHYLENE GLYCOL 3350 17 G PO PACK: 17.0000 g | PACK | Freq: Every day | ORAL | Status: DC

## 2022-11-01 MED ORDER — POLYETHYLENE GLYCOL 3350 17 G PO PACK
17.0000 g | PACK | Freq: Every day | ORAL | Status: DC
  Administered 2022-11-01: 17 g via ORAL
  Filled 2022-11-01: qty 1

## 2022-11-01 MED ORDER — ONDANSETRON HCL 4 MG PO TABS: 4.0000 mg | ORAL_TABLET | Freq: Four times a day (QID) | ORAL | Status: DC | PRN

## 2022-11-01 MED ORDER — HYDROCHLOROTHIAZIDE 25 MG PO TABS
25.0000 mg | ORAL_TABLET | Freq: Every day | ORAL | Status: DC
  Administered 2022-11-02 – 2022-11-06 (×4): 25 mg
  Filled 2022-11-01 (×4): qty 1

## 2022-11-01 MED ORDER — SIMETHICONE 80 MG PO CHEW
80.0000 mg | CHEWABLE_TABLET | Freq: Four times a day (QID) | ORAL | Status: DC | PRN
  Administered 2022-11-02: 80 mg
  Filled 2022-11-01: qty 1

## 2022-11-01 MED ORDER — LOSARTAN POTASSIUM 25 MG PO TABS
25.0000 mg | ORAL_TABLET | Freq: Every day | ORAL | Status: DC
  Administered 2022-11-01 – 2022-11-06 (×6): 25 mg
  Filled 2022-11-01 (×6): qty 1

## 2022-11-01 NOTE — Progress Notes (Signed)
C/O abdominal pressure. Visual abdominal distension noted. Notified MD Arrie SenateTrisha Mangle. NG tube connected back to low int suction

## 2022-11-01 NOTE — Progress Notes (Signed)
Patient has COPD which requires upper body to be positioned in ways not feasible with a normal bed. Head must be elevated at least 45 degrees or he has difficulty breathing

## 2022-11-01 NOTE — Plan of Care (Signed)

## 2022-11-01 NOTE — Progress Notes (Signed)
Physical Therapy Treatment Patient Details Name: Joe Torres MRN: [id number] DOB: 19-Mar-1946 Today's Date: 11/01/2022   History of Present Illness Joe Torres is a 77 year old male with a past medical history of asthma/COPD overlap, who presented to the hospital with abdominal pain. Being treated for diverticulitis of colon with perforation.    PT Comments    Pt was seated EOB upon arrival. Supportive spouse and daughter present. Pt agrees to session and is much less SOB overall. He did not use inhaler prior to OOB activity. Pt demonstrated abilities to safely stand and ambulate ~ 200 ft with only 2 standing rest. Overall pt is progressing well form a PT standpoint. Recommend continued skilled PT to maximize independence and safety with all ADLs. While progressing activity tolerance to PLOF.   Recommendations for follow up therapy are one component of a multi-disciplinary discharge planning process, led by the attending physician.  Recommendations may be updated based on patient status, additional functional criteria and insurance authorization.     Assistance Recommended at Discharge Set up Supervision/Assistance  Patient can return home with the following A little help with walking and/or transfers;Assistance with cooking/housework;Assist for transportation;Help with stairs or ramp for entrance;A little help with bathing/dressing/bathroom   Equipment Recommendations  None recommended by PT       Precautions / Restrictions Precautions Precautions: Fall Precaution Comments: O2 Restrictions Weight Bearing Restrictions: No     Mobility  Bed Mobility  General bed mobility comments: pt was seated EOB pre/post session    Transfers Overall transfer level: Needs assistance Equipment used: Rolling walker (2 wheels) Transfers: Sit to/from Stand Sit to Stand: Min guard  General transfer comment: CGA for safety with vcs for handplacement. pt stood form lowest bed height     Ambulation/Gait Ambulation/Gait assistance: Supervision Gait Distance (Feet): 200 Feet Assistive device: Rolling walker (2 wheels) (o2) Gait Pattern/deviations: Step-through pattern, Narrow base of support, Trunk flexed Gait velocity: decreased  General Gait Details: pt tends to have slightly flexed posture + narrow BOS. Overall demonstrated much improved activity tolerance. Much less SOB overall today versus previous sessions observed    Balance Overall balance assessment: Needs assistance Sitting-balance support: No upper extremity supported, Feet supported Sitting balance-Leahy Scale: Good     Standing balance support: Bilateral upper extremity supported, Reliant on assistive device for balance, During functional activity Standing balance-Leahy Scale: Good    Cognition Arousal/Alertness: Awake/alert Behavior During Therapy: WFL for tasks assessed/performed Overall Cognitive Status: Within Functional Limits for tasks assessed    General Comments: Pt is A and O x 4. Much less SOB overall. Did not use/need inhaler today prior to session. cleared to have NG removed form suction to ambulate. Re-applied afterwards               Pertinent Vitals/Pain Pain Assessment Pain Assessment: No/denies pain Pain Score: 0-No pain     PT Goals (current goals can now be found in the care plan section) Acute Rehab PT Goals Patient Stated Goal: to go home Progress towards PT goals: Progressing toward goals    Frequency    Min 3X/week      PT Plan Current plan remains appropriate       AM-PAC PT "6 Clicks" Mobility   Outcome Measure  Help needed turning from your back to your side while in a flat bed without using bedrails?: None Help needed moving from lying on your back to sitting on the side of a flat bed without using bedrails?: None Help  needed moving to and from a bed to a chair (including a wheelchair)?: None Help needed standing up from a chair using your arms (e.g.,  wheelchair or bedside chair)?: None Help needed to walk in hospital room?: A Little Help needed climbing 3-5 steps with a railing? : A Little 6 Click Score: 22    End of Session Equipment Utilized During Treatment: Oxygen Activity Tolerance: Patient tolerated treatment well Patient left: Other (comment);with family/visitor present;with call bell/phone within reach;with bed alarm set (seated EOB pre/post session) Nurse Communication: Mobility status PT Visit Diagnosis: Other abnormalities of gait and mobility (R26.89);Muscle weakness (generalized) (M62.81)     Time: 1350-1406 PT Time Calculation (min) (ACUTE ONLY): 16 min  Charges:  $Gait Training: 8-22 mins                    Jetta Lout PTA 11/01/22, 2:23 PM

## 2022-11-01 NOTE — TOC Progression Note (Signed)
Transition of Care [facility]) - Progression Note    Patient Details  Name: Joe Torres MRN: [id number] Date of Birth: December 16, 1945  Transition of Care [facility]) CM/SW Contact  Marlowe Sax, RN Phone Number: 11/01/2022, 1:43 PM  Clinical Narrative:     Met with the patient and his family in the room They report that they will need a hospital bed, The address where it will go was confirmed To call daughter [phone] to set up when the hospital will be delivered, [employer] made aware  Expected Discharge Plan: Home w Home Health Services Barriers to Discharge: Continued Medical Work up  Expected Discharge Plan and Services   Discharge Planning Services: CM Consult   Living arrangements for the past 2 months: Single Family Home                 DME Arranged: Hospital bed DME Agency: [employer] Date DME Agency Contacted: 11/01/22 Time DME Agency Contacted: [phone] Representative spoke with at DME Agency: Osvaldo Angst Arranged: PT, RN [facility] Agency: [facility] Date [facility] Agency Contacted: 10/23/22 Time HH Agency Contacted: [phone] Representative spoke with at [facility] Agency: [place]   Social Determinants of Health (SDOH) Interventions SDOH Screenings   Food Insecurity: No Food Insecurity (10/20/2022)  Housing: Low Risk  (10/20/2022)  Transportation Needs: No Transportation Needs (10/20/2022)  Utilities: Not At Risk (10/20/2022)  Tobacco Use: Medium Risk (10/29/2022)    Readmission Risk Interventions     No data to display

## 2022-11-01 NOTE — Care Management Important Message (Signed)
Important Message  Patient Details  Name: Joe Torres MRN: [id number] Date of Birth: 1945/10/02   Medicare Important Message Given:  Yes     [name] 11/01/2022, 2:05 PM

## 2022-11-01 NOTE — Progress Notes (Signed)
Progress Note   Patient: Joe Torres ZOX:[id number] DOB: 12-23-1945 DOA: 10/20/2022     12 DOS: the patient was seen and examined on 11/01/2022    Subjective: Patient seen in the presence of the family Respiratory function much improved today Denies worsening abdominal pain chest pain or cough  Brief hospital course: From HPI "TANIELA FELTUS is a 77 y.o. male with medical history significant of chronic respiratory failure on 2 to 3 L, COPD, GERD, hypertension, history of bladder cancer in remission presenting with abdominal pain, perforated diverticulitis, COPD exacerbation, acute on chronic respiratory failure hypoxia.  Patient ports increased work of breathing over the past 4 to 5 days.  Positive wheezing cough, increased sputum production.  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  Positive increased work of breathing despite inhaler use.  No reported sick contacts.  No fevers or chills.  No hemiparesis or confusion.  Patient also ports worsening lower abdominal pain over the past 3 days.  Mild nausea but no vomiting.  No reported diarrhea.  No reported recent dietary changes.  Prior surgical history includes transurethral resection of bladder tumor in 2022 as well as chemoradiation to the area.  Noted prior colonoscopy September 2022 with reported diverticulosis.  Also with hernia repair in 2017. Presented to the ER afebrile, hemodynamically stable, satting 9 9% on 3 L.  White count 12.4, hemoglobin 14.3, platelets 348, creatinine 1.11, urinalysis within normal limits.  CTA of the chest as well as CT of the abdomen pelvis obtained showing severe centrilobular emphysema concerning for COPD as well as small to moderate pneumoperitoneum with concern for perforated sigmoid diverticulitis without abscess.  Dr. Maia Plan with general surgery evaluating patient with plan for operative repair today."   Hospital course complicated by post-op ileus, now  recurrent.  Requiring NG tube for decompression.          Assessment and Plan:   Diverticulitis of colon with perforation s/p surgery Noted lower abdominal pain x 2 to 3 days with diverticulitis with perforation on CT scan Patient underwent low anterior resection with primary anastomosis and diverting loop ileostomy creation by surgeon done on 10/20/2022 --Dr. Maia Plan, general surgery following - see their recs --Completed 7 days course IV Zosyn  --I Reviewed CT scan of the abdomen results showing severe small bowel obstruction --Postoperative supportive care, pain control --ostomy teaching / training is underway for family and patient Continue current pain medication 6/14: changed oxycodone to oral dilaudid (pt reports feeling more short of breath and tight in his chest after oxycodone) We will continue NG tube which has been recommended to suction Diet as recommended by surgeon Plan of care discussed with surgical team   Postoperative Ileus -- Recurrent. Patient now has NG tube in place on account of paralytic ileus CT scan of the abdomen have been reviewed by me showing findings suggestive of simple severe small bowel obstruction Surgeon on board and case discussed     Acute on chronic respiratory failure with hypoxia (HCC) Severe COPD/Emphysema with Acute Exacerbation Acute decompensated respiratory failure in the setting of COPD exacerbation with cough wheezing, increased sputum production.  CT scan chest showed severe emphysema. Baseline home O2 use around 2-3L.  Stable on 3 L/min here. Patient completed 5 days course of 40 mg prednisone and then was initiated on IV Solu-Medrol 40 twice daily and currently on 40 daily Continue steroid therapy Continue as needed nebulization --Continue neb  treatments, Daliresp --Changed albuterol nebs >> Xopenex due to tachycardia -- Continue to hold prednisone 10 mg daily    Hypokalemia - replaced 6/12, 6/14. Continue repletion and  monitoring Potassium found to be 3.3 on 11/01/2022   Malignant neoplasm of overlapping sites of bladder [facility]) Noted prior history of high-grade urothelial carcinoma  s/p concurrent chemoradiation with 5-FU mitomycin ending in March 2022  No evidence of disease per 05/2022 Rad-onc note Continue to monitor closely-outpatient follow-up with oncologist   Hypertension BP stable Continue amlodipine hydrochlorothiazide and losartan when able to take p.o. meds Continue as needed IV hydralazine Avoiding beta-blockers due to severe emphysema/COPD   Hyperlipidemia Hold statin while NPO   GERD (gastroesophageal reflux disease) PPI - currently on IV Protonix daily -- increased to BID given significant symptomatic reflux   BPH (benign prostatic hyperplasia) 6/12-13: pt having issues voiding, weak stream, difficulty initiating stream, sensation of incomplete emptying Continue Flomax --Monitor post-void residuals by bladder scan --Added Proscar 6/13 6/14: pt reports improved stream with voiding      Advance Care Planning:   Code Status: Full Code    Consults: General Surgery w/ Dr. Maia Plan    Family Communication: Plan of care DISCUSSED with patient's family present at bedside      Physical Exam:   General exam: Awake in some distress requiring intranasal oxygen HEENT: moist mucus membranes, hearing grossly normal  Respiratory system: Wheezing much improved today Cardiovascular system: normal S1/S2, RRR, no pedal edema.   Gastrointestinal system: Minimal tenderness around ileostomy site Central nervous system: A&O x 3. no gross focal neurologic deficits, normal speech Extremities: moves all, no edema, normal tone Skin: dry, intact, normal temperature Psychiatry: normal mood, congruent affect, judgement and insight appear normal      Data Reviewed: Labs reviewed showing potassium 3.3 WBC 16.8       Vitals:   11/01/22 0723 11/01/22 0812 11/01/22 1419 11/01/22 1653  BP:  (!)  152/76  (!) 131/97  Pulse: 89 91 99 99  Resp: 18 20 18 18   Temp:  98.6 F (37 C)  99.3 F (37.4 C)  TempSrc:  Oral  Axillary  SpO2: 98% 100% 96% 96%  Weight:      Height:         Author: Loyce Dys, MD 11/01/2022 5:53 PM  For on call review [email or web address].

## 2022-11-01 NOTE — Progress Notes (Signed)
Patient ID: Joe Torres, male   DOB: 1946/04/02, 77 y.o.   MRN: [id number]     SURGICAL PROGRESS NOTE   Hospital Day(s): 12.   Interval History: Patient seen and examined.  Good distended again last night and felt bloated.  NGT back to suction.  This morning with the abdomen softer gaining weight at 100 cc of enteric content.  Vital signs in last 24 hours: [min-max] current  Temp:  [98.2 F (36.8 C)-98.7 F (37.1 C)] 98.2 F (36.8 C) (06/21 0038) Pulse Rate:  [87-94] 87 (06/21 0038) Resp:  [16-22] 22 (06/21 0038) BP: (153-159)/(76-99) 157/76 (06/21 0038) SpO2:  [95 %-98 %] 98 % (06/21 0038) FiO2 (%):  [32 %] 32 % (06/20 1951)     Height: 5\' 9"  (175.3 cm) Weight: 69.8 kg BMI (Calculated): 22.71   Physical Exam:  Constitutional: alert, cooperative and no distress  Respiratory: breathing non-labored at rest  Cardiovascular: regular rate and sinus rhythm  Gastrointestinal: soft, non-tender, and mild-distended.  Ileostomy is pink and patent.  Labs:     Latest Ref Rng & Units 11/01/2022    5:26 AM 10/31/2022    4:46 AM 10/30/2022    5:41 AM  CBC  WBC 4.0 - 10.5 K/uL 16.8  17.8  17.8   Hemoglobin 13.0 - 17.0 g/dL 40.9  81.1  91.4   Hematocrit 39.0 - 52.0 % 40.0  40.6  40.8   Platelets 150 - 400 K/uL 243  277  299       Latest Ref Rng & Units 11/01/2022    5:26 AM 10/31/2022    4:46 AM 10/28/2022    7:09 AM  CMP  Glucose 70 - 99 mg/dL 782  956  213   BUN 8 - 23 mg/dL 12  18  20    Creatinine 0.61 - 1.24 mg/dL 0.86  5.78  4.69   Sodium 135 - 145 mmol/L 139  142  137   Potassium 3.5 - 5.1 mmol/L 3.3  3.6  4.1   Chloride 98 - 111 mmol/L 95  100  97   CO2 22 - 32 mmol/L 38  34  31   Calcium 8.9 - 10.3 mg/dL 8.6  8.7  8.7     Imaging studies: No new pertinent imaging studies   Assessment/Plan:  77 y.o. male with perforated diverticulitis 12 Day Post-Op s/p partial colectomy with anastomosis and loop ileostomy creation, complicated by pertinent comorbidities including severe COPD,  history of bladder cancer, hypertension.   -Patient with recurrent obstruction last night.  This morning again with softer abdomen and passing enteric content through the ileostomy -I clamped the NGT again to do an order from prior. -If patient fails another clamp trial will discuss about revision of ileostomy -Will reevaluate in few hours.  Gae Gallop, MD

## 2022-11-01 NOTE — Progress Notes (Signed)
Patient ID: Joe Torres, male   DOB: Jul 15, 1945, 77 y.o.   MRN: [id number]     SURGICAL PROGRESS NOTE   Hospital Day(s): 12.   Interval History: Patient seen and examined.  He continue passing stool through the ileostomy.  I personally changed ileostomy bag in the morning and now ileostomy bag continued having enteric output.  Physical Exam:  Constitutional: alert, cooperative and no distress  Respiratory: breathing non-labored at rest  Cardiovascular: regular rate and sinus rhythm  Gastrointestinal: soft, non-tender, and mild-distended   Assessment/Plan:  Continue passing stool through ileostomy.  It does look very thick.  Will try to liquefy little bit more the enteric contact to see if this helps to go through the ileostomy.  Gae Gallop, MD

## 2022-11-02 DIAGNOSIS — K5720 Diverticulitis of large intestine with perforation and abscess without bleeding: Secondary | ICD-10-CM | POA: Diagnosis not present

## 2022-11-02 LAB — CBC WITH DIFFERENTIAL/PLATELET
Abs Immature Granulocytes: 0.11 10*3/uL — ABNORMAL HIGH (ref 0.00–0.07)
Basophils Absolute: 0 10*3/uL (ref 0.0–0.1)
Basophils Relative: 0 %
Eosinophils Absolute: 0 10*3/uL (ref 0.0–0.5)
Eosinophils Relative: 0 %
HCT: 40.8 % (ref 39.0–52.0)
Hemoglobin: 12.8 g/dL — ABNORMAL LOW (ref 13.0–17.0)
Immature Granulocytes: 1 %
Lymphocytes Relative: 2 %
Lymphs Abs: 0.4 10*3/uL — ABNORMAL LOW (ref 0.7–4.0)
MCH: 30 pg (ref 26.0–34.0)
MCHC: 31.4 g/dL (ref 30.0–36.0)
MCV: 95.8 fL (ref 80.0–100.0)
Monocytes Absolute: 1.1 10*3/uL — ABNORMAL HIGH (ref 0.1–1.0)
Monocytes Relative: 7 %
Neutro Abs: 14.8 10*3/uL — ABNORMAL HIGH (ref 1.7–7.7)
Neutrophils Relative %: 90 %
Platelets: 256 10*3/uL (ref 150–400)
RBC: 4.26 MIL/uL (ref 4.22–5.81)
RDW: 13.1 % (ref 11.5–15.5)
WBC: 16.5 10*3/uL — ABNORMAL HIGH (ref 4.0–10.5)
nRBC: 0 % (ref 0.0–0.2)

## 2022-11-02 LAB — BASIC METABOLIC PANEL
Anion gap: 9 (ref 5–15)
BUN: 14 mg/dL (ref 8–23)
CO2: 35 mmol/L — ABNORMAL HIGH (ref 22–32)
Calcium: 8.8 mg/dL — ABNORMAL LOW (ref 8.9–10.3)
Chloride: 97 mmol/L — ABNORMAL LOW (ref 98–111)
Creatinine, Ser: 0.59 mg/dL — ABNORMAL LOW (ref 0.61–1.24)
GFR, Estimated: >60 mL/min (ref >60)
Glucose, Bld: 93 mg/dL (ref 70–99)
Potassium: 3.5 mmol/L (ref 3.5–5.1)
Sodium: 141 mmol/L (ref 135–145)

## 2022-11-02 MED ORDER — POLYETHYLENE GLYCOL 3350 17 G PO PACK
17.0000 g | PACK | Freq: Every day | ORAL | Status: DC
  Administered 2022-11-02 – 2022-11-06 (×4): 17 g
  Filled 2022-11-02 (×5): qty 1

## 2022-11-02 NOTE — Progress Notes (Signed)
   11/02/22 0634  Implanted Port 06/19/20 Right Chest  Placement Date/Time: 06/19/20 1621   Time Out: Correct patient;Correct site;Correct procedure  Maximum sterile barrier precautions: Hand hygiene;Large sterile sheet;Cap;Mask;Sterile gown;Sterile gloves  Site Prep: Chlorhexidine (preferred);Skin Prep C...  Site Assessment Clean, Dry, Intact  Port Status Deaccessed - Adhesive Bandage Placed  Dressing Type Other (Comment) (bandaid)

## 2022-11-02 NOTE — Progress Notes (Signed)
Progress Note   Patient: Joe Torres:[id number] DOB: 13-Sep-1945 DOA: 10/20/2022     13 DOS: the patient was seen and examined on 11/02/2022    Subjective: Patient seen and examined today sitting up in a chair Aspiratory function much improved NG tube is now disconnected from suction Denies worsening abdominal pain nausea vomiting or cough  Brief hospital course: From HPI "Joe Torres is a 77 y.o. male with medical history significant of chronic respiratory failure on 2 to 3 L, COPD, GERD, hypertension, history of bladder cancer in remission presenting with abdominal pain, perforated diverticulitis, COPD exacerbation, acute on chronic respiratory failure hypoxia.  Patient ports increased work of breathing over the past 4 to 5 days.  Positive wheezing cough, increased sputum production.  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  Positive increased work of breathing despite inhaler use.  No reported sick contacts.  No fevers or chills.  No hemiparesis or confusion.  Patient also ports worsening lower abdominal pain over the past 3 days.  Mild nausea but no vomiting.  No reported diarrhea.  No reported recent dietary changes.  Prior surgical history includes transurethral resection of bladder tumor in 2022 as well as chemoradiation to the area.  Noted prior colonoscopy September 2022 with reported diverticulosis.  Also with hernia repair in 2017. Presented to the ER afebrile, hemodynamically stable, satting 9 9% on 3 L.  White count 12.4, hemoglobin 14.3, platelets 348, creatinine 1.11, urinalysis within normal limits.  CTA of the chest as well as CT of the abdomen pelvis obtained showing severe centrilobular emphysema concerning for COPD as well as small to moderate pneumoperitoneum with concern for perforated sigmoid diverticulitis without abscess.  Dr. Maia Plan with general surgery evaluating patient with plan for operative repair today."    Hospital course complicated by post-op ileus, now recurrent.  Requiring NG tube for decompression.          Assessment and Plan:   Diverticulitis of colon with perforation s/p surgery Noted lower abdominal pain x 2 to 3 days with diverticulitis with perforation on CT scan Patient underwent low anterior resection with primary anastomosis and diverting loop ileostomy creation by surgeon done on 10/20/2022 --Dr. Maia Plan, general surgery following - see their recs --Completed 7 days course IV Zosyn   CT scan of the abdomen results showing severe small bowel obstruction Continue supportive care and pain control -- Continue ostomy teaching / training Continue current pain medication 6/14: changed oxycodone to oral dilaudid (pt reports feeling more short of breath and tight in his chest after oxycodone) Continue laxatives Plan of care discussed with surgeon   Postoperative Ileus -- Recurrent. Continue NG tube as recommended by surgeon which is disconnected from suction Continue advancement of diet according to surgical recommendation     Acute on chronic respiratory failure with hypoxia (HCC) Severe COPD/Emphysema with Acute Exacerbation Acute decompensated respiratory failure in the setting of COPD exacerbation with cough wheezing, increased sputum production.  CT scan chest showed severe emphysema. Baseline home O2 use around 2-3L.  Stable on 3 L/min here. Patient completed 5 days course of 40 mg prednisone and then was initiated on IV Solu-Medrol 40 twice daily and currently on 40 daily We will begin to taper down steroid from tomorrow Continue nebulization --Continue neb treatments, Daliresp --Changed albuterol nebs >> Xopenex due to tachycardia -- Continue to hold prednisone 10 mg daily    Hypokalemia - replaced  6/12, 6/14. Continue repletion and monitoring Potassium found to be 3.3 on 11/01/2022   Malignant neoplasm of overlapping sites of bladder [facility]) Noted prior history of  high-grade urothelial carcinoma  s/p concurrent chemoradiation with 5-FU mitomycin ending in March 2022  No evidence of disease per 05/2022 Rad-onc note Continue to monitor closely-outpatient follow-up with oncologist   Hypertension BP stable Continue amlodipine hydrochlorothiazide and losartan when able to take p.o. meds Continue as needed IV hydralazine Avoiding beta-blockers due to severe emphysema/COPD   Hyperlipidemia Hold statin while NPO   GERD (gastroesophageal reflux disease) PPI - currently on IV Protonix daily -- increased to BID given significant symptomatic reflux   BPH (benign prostatic hyperplasia) 6/12-13: pt having issues voiding, weak stream, difficulty initiating stream, sensation of incomplete emptying Continue Flomax --Monitor post-void residuals by bladder scan --Added Proscar 6/13 6/14: pt reports improved stream with voiding       Advance Care Planning:   Code Status: Full Code    Consults: General Surgery w/ Dr. Maia Plan    Family Communication: Plan of care DISCUSSED with patient's family present at bedside      Physical Exam:   General exam: Awake in some distress requiring intranasal oxygen HEENT: moist mucus membranes, hearing grossly normal  Respiratory system: Wheezing resolved Cardiovascular system: normal S1/S2, RRR, no pedal edema.   Gastrointestinal system: Minimal tenderness around ileostomy site Central nervous system: A&O x 3. no gross focal neurologic deficits, normal speech Extremities: moves all, no edema, normal tone Skin: dry, intact, normal temperature Psychiatry: normal mood, congruent affect, judgement and insight appear normal       Data Reviewed: I reviewed the patient's labs showing sodium 141 potassium 3.5 creatinine 0.59 WBC 16.5     Vitals:   11/02/22 0831 11/02/22 0840 11/02/22 1404 11/02/22 1527  BP:    (!) 151/92  Pulse:    94  Resp:    16  Temp:    98.4 F (36.9 C)  TempSrc:      SpO2: 98% 98% 96% 97%   Weight:      Height:         Author: Loyce Dys, MD 11/02/2022 3:30 PM  For on call review [email or web address].

## 2022-11-02 NOTE — Progress Notes (Signed)
Pt refused mobility stating he was too exhausted. Pt education provided on mobility and morphine. Pt and son at bedside verbalize understanding.  Ostomy pouch emptied once on shift with 3/4 full. There is less than 1/4 full at this time.

## 2022-11-02 NOTE — Progress Notes (Signed)
Occupational Therapy Treatment Patient Details Name: Joe Torres MRN: [id number] DOB: 1945-05-29 Today's Date: 11/02/2022   History of present illness Joe Torres is a 77 year old male with a past medical history of asthma/COPD overlap, who presented to the hospital with abdominal pain. Being treated for diverticulitis of colon with perforation.   OT comments  Pt able to ambulate ~ 200 ft with RW and 3L O2, requires 3 standing rest breaks. Standing balance good throughout. Able to perform grooming tasks standing at sink w/ 1-handed UE support on cabinet. O2 sats at 94-98% during session. Pt continues to make good progress.    Recommendations for follow up therapy are one component of a multi-disciplinary discharge planning process, led by the attending physician.  Recommendations may be updated based on patient status, additional functional criteria and insurance authorization.    Assistance Recommended at Discharge Intermittent Supervision/Assistance  Patient can return home with the following  A little help with walking and/or transfers;A little help with bathing/dressing/bathroom;Help with stairs or ramp for entrance;Assistance with cooking/housework;Assist for transportation   Equipment Recommendations  None recommended by OT    Recommendations for Other Services      Precautions / Restrictions Precautions Precautions: Fall Restrictions Weight Bearing Restrictions: No       Mobility Bed Mobility               General bed mobility comments: Received/left in chair    Transfers Overall transfer level: Needs assistance Equipment used: Rolling walker (2 wheels) Transfers: Sit to/from Stand Sit to Stand: Supervision                 Balance Overall balance assessment: Needs assistance Sitting-balance support: No upper extremity supported, Feet supported Sitting balance-Leahy Scale: Good     Standing balance support: Bilateral upper extremity supported, Reliant on  assistive device for balance, During functional activity Standing balance-Leahy Scale: Good                             ADL either performed or assessed with clinical judgement   ADL Overall ADL's : Needs assistance/impaired     Grooming: Oral care;Min guard;Wash/dry hands               Lower Body Dressing: Supervision/safety Lower Body Dressing Details (indicate cue type and reason): donning shoes                    Extremity/Trunk Assessment Upper Extremity Assessment Upper Extremity Assessment: Overall WFL for tasks assessed   Lower Extremity Assessment Lower Extremity Assessment: Overall WFL for tasks assessed        Vision       Perception     Praxis      Cognition Arousal/Alertness: Awake/alert Behavior During Therapy: WFL for tasks assessed/performed Overall Cognitive Status: Within Functional Limits for tasks assessed                                          Exercises Other Exercises Other Exercises: Educ re: importance of OOB mobility, ambulating short distances frequently; UE and LE therex in sitting    Shoulder Instructions       General Comments      Pertinent Vitals/ Pain       Pain Assessment Pain Score: 2  Pain Location: inside his throat/neck, he can feel  the feeding tube Pain Descriptors / Indicators: Discomfort Pain Intervention(s): Repositioned, Relaxation  Home Living                                          Prior Functioning/Environment              Frequency  Min 1X/week        Progress Toward Goals  OT Goals(current goals can now be found in the care plan section)  Progress towards OT goals: Progressing toward goals  Acute Rehab OT Goals OT Goal Formulation: With patient Time For Goal Achievement: 11/04/22 Potential to Achieve Goals: Good  Plan Discharge plan remains appropriate;Frequency remains appropriate    Co-evaluation                  AM-PAC OT "6 Clicks" Daily Activity     Outcome Measure   Help from another person eating meals?: None Help from another person taking care of personal grooming?: A Little Help from another person toileting, which includes using toliet, bedpan, or urinal?: A Little Help from another person bathing (including washing, rinsing, drying)?: A Little Help from another person to put on and taking off regular upper body clothing?: None Help from another person to put on and taking off regular lower body clothing?: A Little 6 Click Score: 20    End of Session Equipment Utilized During Treatment: Oxygen;Rolling walker (2 wheels)  OT Visit Diagnosis: Unsteadiness on feet (R26.81);Muscle weakness (generalized) (M62.81)   Activity Tolerance Patient tolerated treatment well   Patient Left in chair;with call bell/phone within reach;with family/visitor present   Nurse Communication          Time: 1610-9604 OT Time Calculation (min): 20 min  Charges: OT General Charges $OT Visit: 1 Visit OT Treatments $Self Care/Home Management : 8-22 mins Joe Craver, PhD, MS, OTR/L 11/02/22, 10:19 AM

## 2022-11-02 NOTE — Progress Notes (Signed)
Patient ID: Joe Torres, male   DOB: 16-Sep-1945, 77 y.o.   MRN: [id number]     SURGICAL PROGRESS NOTE   Hospital Day(s): 13.   Interval History: Patient seen and examined, no acute events or new complaints overnight. Patient reports had a lot of enteric content in the bag last night.  As per chart it was reported over 800 cc of enteric contact.  Patient feeling better this morning.  No bloating or nausea.  Vital signs in last 24 hours: [min-max] current  Temp:  [98 F (36.7 C)-99.3 F (37.4 C)] 98 F (36.7 C) (06/22 0830) Pulse Rate:  [94-99] 97 (06/22 0830) Resp:  [16-20] 16 (06/22 0830) BP: (124-173)/(83-97) 124/83 (06/22 0830) SpO2:  [96 %-98 %] 98 % (06/22 0840)     Height: 5\' 9"  (175.3 cm) Weight: 69.8 kg BMI (Calculated): 22.71   Physical Exam:  Constitutional: alert, cooperative and no distress  Respiratory: breathing non-labored at rest  Cardiovascular: regular rate and sinus rhythm  Gastrointestinal: soft, non-tender, and mild-distended  Labs:     Latest Ref Rng & Units 11/02/2022    5:16 AM 11/01/2022    5:26 AM 10/31/2022    4:46 AM  CBC  WBC 4.0 - 10.5 K/uL 16.5  16.8  17.8   Hemoglobin 13.0 - 17.0 g/dL 40.9  81.1  91.4   Hematocrit 39.0 - 52.0 % 40.8  40.0  40.6   Platelets 150 - 400 K/uL 256  243  277       Latest Ref Rng & Units 11/02/2022    5:16 AM 11/01/2022    5:26 AM 10/31/2022    4:46 AM  CMP  Glucose 70 - 99 mg/dL 93  782  956   BUN 8 - 23 mg/dL 14  12  18    Creatinine 0.61 - 1.24 mg/dL 2.13  0.86  5.78   Sodium 135 - 145 mmol/L 141  139  142   Potassium 3.5 - 5.1 mmol/L 3.5  3.3  3.6   Chloride 98 - 111 mmol/L 97  95  100   CO2 22 - 32 mmol/L 35  38  34   Calcium 8.9 - 10.3 mg/dL 8.8  8.6  8.7     Imaging studies: No new pertinent imaging studies   Assessment/Plan:  77 y.o. male with perforated diverticulitis 13 Day Post-Op s/p partial colectomy with anastomosis and loop ileostomy creation, complicated by pertinent comorbidities including severe  COPD, history of bladder cancer, hypertension.   -Patient with increased output through the ileostomy.  Abdominal exam with less distention.  Will try another NGT clamp and clear liquid diet keeping the laxative -Encouraged the patient to ambulate -Continue medical management of comorbidities -Continue DVT prophylaxis Gae Gallop, MD

## 2022-11-03 ENCOUNTER — Other Ambulatory Visit: Payer: Self-pay

## 2022-11-03 ENCOUNTER — Inpatient Hospital Stay: Payer: Medicare HMO | Admitting: Anesthesiology

## 2022-11-03 ENCOUNTER — Encounter
Admission: EM | Disposition: A | Discharge status: Home Health | Payer: Self-pay | Source: Home / Self Care | Attending: Internal Medicine

## 2022-11-03 DIAGNOSIS — K5720 Diverticulitis of large intestine with perforation and abscess without bleeding: Secondary | ICD-10-CM | POA: Diagnosis not present

## 2022-11-03 HISTORY — PX: ILEOSTOMY: SHX1783

## 2022-11-03 LAB — BASIC METABOLIC PANEL
Anion gap: 7 (ref 5–15)
BUN: 13 mg/dL (ref 8–23)
CO2: 37 mmol/L — ABNORMAL HIGH (ref 22–32)
Calcium: 8.9 mg/dL (ref 8.9–10.3)
Chloride: 99 mmol/L (ref 98–111)
Creatinine, Ser: 0.61 mg/dL (ref 0.61–1.24)
GFR, Estimated: >60 mL/min (ref >60)
Glucose, Bld: 114 mg/dL — ABNORMAL HIGH (ref 70–99)
Potassium: 4 mmol/L (ref 3.5–5.1)
Sodium: 143 mmol/L (ref 135–145)

## 2022-11-03 SURGERY — CREATION, ILEOSTOMY
Anesthesia: General

## 2022-11-03 MED ORDER — LIDOCAINE HCL (PF) 2 % IJ SOLN
INTRAMUSCULAR | Status: AC
  Filled 2022-11-03: qty 5

## 2022-11-03 MED ORDER — PROPOFOL 10 MG/ML IV BOLUS
INTRAVENOUS | Status: DC | PRN
  Administered 2022-11-03: 80 mg via INTRAVENOUS

## 2022-11-03 MED ORDER — ROCURONIUM BROMIDE 10 MG/ML (PF) SYRINGE
PREFILLED_SYRINGE | INTRAVENOUS | Status: AC
  Filled 2022-11-03: qty 10

## 2022-11-03 MED ORDER — FENTANYL CITRATE (PF) 100 MCG/2ML IJ SOLN
25.0000 ug | INTRAMUSCULAR | Status: DC | PRN
  Administered 2022-11-03 (×2): 25 ug via INTRAVENOUS
  Administered 2022-11-03: 50 ug via INTRAVENOUS

## 2022-11-03 MED ORDER — PHENYLEPHRINE HCL (PRESSORS) 10 MG/ML IV SOLN
INTRAVENOUS | Status: DC | PRN
  Administered 2022-11-03 (×2): 80 ug via INTRAVENOUS
  Administered 2022-11-03 (×2): 160 ug via INTRAVENOUS

## 2022-11-03 MED ORDER — PHENYLEPHRINE HCL-NACL 20-0.9 MG/250ML-% IV SOLN
INTRAVENOUS | Status: AC
  Filled 2022-11-03: qty 250

## 2022-11-03 MED ORDER — FENTANYL CITRATE (PF) 100 MCG/2ML IJ SOLN
INTRAMUSCULAR | Status: AC
  Filled 2022-11-03: qty 2

## 2022-11-03 MED ORDER — DEXAMETHASONE SODIUM PHOSPHATE 10 MG/ML IJ SOLN
INTRAMUSCULAR | Status: DC | PRN
  Administered 2022-11-03: 10 mg via INTRAVENOUS

## 2022-11-03 MED ORDER — CEFAZOLIN SODIUM-DEXTROSE 2-4 GM/100ML-% IV SOLN
INTRAVENOUS | Status: AC
  Filled 2022-11-03: qty 100

## 2022-11-03 MED ORDER — ROCURONIUM BROMIDE 100 MG/10ML IV SOLN
INTRAVENOUS | Status: DC | PRN
  Administered 2022-11-03: 20 mg via INTRAVENOUS
  Administered 2022-11-03: 10 mg via INTRAVENOUS

## 2022-11-03 MED ORDER — 0.9 % SODIUM CHLORIDE (POUR BTL) OPTIME
TOPICAL | Status: DC | PRN
  Administered 2022-11-03: 1000 mL

## 2022-11-03 MED ORDER — LIDOCAINE HCL (CARDIAC) PF 100 MG/5ML IV SOSY
PREFILLED_SYRINGE | INTRAVENOUS | Status: DC | PRN
  Administered 2022-11-03: 60 mg via INTRAVENOUS

## 2022-11-03 MED ORDER — ONDANSETRON HCL 4 MG/2ML IJ SOLN
INTRAMUSCULAR | Status: DC | PRN
  Administered 2022-11-03: 4 mg via INTRAVENOUS

## 2022-11-03 MED ORDER — SUCCINYLCHOLINE CHLORIDE 200 MG/10ML IV SOSY
PREFILLED_SYRINGE | INTRAVENOUS | Status: DC | PRN
  Administered 2022-11-03: 100 mg via INTRAVENOUS

## 2022-11-03 MED ORDER — BUPIVACAINE LIPOSOME 1.3 % IJ SUSP
INTRAMUSCULAR | Status: DC | PRN
  Administered 2022-11-03: 20 mL

## 2022-11-03 MED ORDER — FENTANYL CITRATE (PF) 100 MCG/2ML IJ SOLN
INTRAMUSCULAR | Status: DC | PRN
  Administered 2022-11-03 (×2): 25 ug via INTRAVENOUS

## 2022-11-03 MED ORDER — IPRATROPIUM-ALBUTEROL 0.5-2.5 (3) MG/3ML IN SOLN
RESPIRATORY_TRACT | Status: AC
  Filled 2022-11-03: qty 3

## 2022-11-03 MED ORDER — PROPOFOL 1000 MG/100ML IV EMUL
INTRAVENOUS | Status: AC
  Filled 2022-11-03: qty 100

## 2022-11-03 MED ORDER — OXYCODONE HCL 5 MG/5ML PO SOLN: 5.0000 mg | Freq: Once | ORAL | Status: DC | PRN

## 2022-11-03 MED ORDER — CEFAZOLIN SODIUM-DEXTROSE 2-3 GM-%(50ML) IV SOLR
INTRAVENOUS | Status: DC | PRN
  Administered 2022-11-03: 2 g via INTRAVENOUS

## 2022-11-03 MED ORDER — PHENYLEPHRINE HCL-NACL 20-0.9 MG/250ML-% IV SOLN
INTRAVENOUS | Status: DC | PRN
  Administered 2022-11-03: 75 ug/min via INTRAVENOUS

## 2022-11-03 MED ORDER — OXYCODONE HCL 5 MG PO TABS: 5.0000 mg | ORAL_TABLET | Freq: Once | ORAL | Status: DC | PRN

## 2022-11-03 MED ORDER — LACTATED RINGERS IV SOLN: INTRAVENOUS | Status: DC | PRN

## 2022-11-03 MED ORDER — SEVOFLURANE IN SOLN
RESPIRATORY_TRACT | Status: AC
  Filled 2022-11-03: qty 250

## 2022-11-03 MED ORDER — BUPIVACAINE-EPINEPHRINE (PF) 0.25% -1:200000 IJ SOLN
INTRAMUSCULAR | Status: DC | PRN
  Administered 2022-11-03: 30 mL

## 2022-11-03 SURGICAL SUPPLY — 55 items
APL PRP STRL LF DISP 70% ISPRP (MISCELLANEOUS) ×1
BARRIER SKIN 2 RING SOFTFLEX (WOUND CARE) IMPLANT
CHLORAPREP W/TINT 26 (MISCELLANEOUS) ×1 IMPLANT
DRAPE LAPAROTOMY 100X77 ABD (DRAPES) ×1 IMPLANT
DRSG OPSITE POSTOP 4X10 (GAUZE/BANDAGES/DRESSINGS) IMPLANT
DRSG OPSITE POSTOP 4X6 (GAUZE/BANDAGES/DRESSINGS) IMPLANT
ELECT BLADE 6.5 EXT (BLADE) ×1 IMPLANT
ELECT CAUTERY BLADE 6.4 (BLADE) ×1 IMPLANT
ELECT REM PT RETURN 9FT ADLT (ELECTROSURGICAL) ×1
ELECTRODE REM PT RTRN 9FT ADLT (ELECTROSURGICAL) ×1 IMPLANT
GAUZE 4X4 16PLY ~~LOC~~+RFID DBL (SPONGE) ×1 IMPLANT
GAUZE SPONGE 4X4 12PLY STRL (GAUZE/BANDAGES/DRESSINGS) IMPLANT
GLOVE BIO SURGEON STRL SZ 6.5 (GLOVE) ×3 IMPLANT
GLOVE BIOGEL PI IND STRL 6.5 (GLOVE) ×2 IMPLANT
GOWN STRL REUS W/ TWL LRG LVL3 (GOWN DISPOSABLE) ×6 IMPLANT
GOWN STRL REUS W/TWL LRG LVL3 (GOWN DISPOSABLE) ×6
KIT OSTOMY 2 PC DRNBL 2.25 STR (WOUND CARE) IMPLANT
KIT OSTOMY DRAINABLE 2.25 STR (WOUND CARE)
KIT OSTOMY DRAINABLE 2.75 STR (WOUND CARE) IMPLANT
KIT OSTOMY DRAINABLE 3.25 STR (WOUND CARE) IMPLANT
KIT TURNOVER KIT A (KITS) ×1 IMPLANT
LABEL OR SOLS (LABEL) ×1 IMPLANT
LIGASURE IMPACT 36 18CM CVD LR (INSTRUMENTS) IMPLANT
MANIFOLD NEPTUNE II (INSTRUMENTS) ×1 IMPLANT
NDL HYPO 22X1.5 SAFETY MO (MISCELLANEOUS) ×1 IMPLANT
NEEDLE HYPO 22X1.5 SAFETY MO (MISCELLANEOUS) ×1 IMPLANT
NS IRRIG 1000ML POUR BTL (IV SOLUTION) ×1 IMPLANT
PACK BASIN MAJOR ARMC (MISCELLANEOUS) ×1 IMPLANT
RELOAD LINEAR CUT PROX 55 BLUE (ENDOMECHANICALS) ×1 IMPLANT
RELOAD PROXIMATE 30MM BLUE (ENDOMECHANICALS) IMPLANT
RELOAD PROXIMATE 75MM BLUE (ENDOMECHANICALS) IMPLANT
RELOAD STAPLE 30 3.6 BLU REG (ENDOMECHANICALS) IMPLANT
RELOAD STAPLE 55 3.8 BLU REG (ENDOMECHANICALS) IMPLANT
RELOAD STAPLE 75 3.8 BLU REG (ENDOMECHANICALS) IMPLANT
SET YANKAUER POOLE SUCT (MISCELLANEOUS) ×1 IMPLANT
SHEARS HARMONIC 23CM COAG (MISCELLANEOUS) IMPLANT
SPONGE T-LAP 18X18 ~~LOC~~+RFID (SPONGE) ×3 IMPLANT
STAPLER GUN LINEAR PROX 60 (STAPLE) IMPLANT
STAPLER PROXIMATE 55 BLUE (STAPLE) IMPLANT
STAPLER PROXIMATE 75MM BLUE (STAPLE) IMPLANT
STAPLER SKIN PROX 35W (STAPLE) IMPLANT
SUT MNCRL 4-0 (SUTURE)
SUT MNCRL 4-0 27XMFL (SUTURE)
SUT SILK 3-0 (SUTURE) ×1 IMPLANT
SUT VIC AB 2-0 BRD 54 (SUTURE) IMPLANT
SUT VIC AB 3-0 54X BRD REEL (SUTURE) IMPLANT
SUT VIC AB 3-0 BRD 54 (SUTURE)
SUT VIC AB 3-0 SH 27 (SUTURE) ×1
SUT VIC AB 3-0 SH 27X BRD (SUTURE) ×1 IMPLANT
SUT VICRYL 3-0 CR8 SH (SUTURE) IMPLANT
SUTURE MNCRL 4-0 27XMF (SUTURE) IMPLANT
SYR 20ML LL LF (SYRINGE) ×1 IMPLANT
TRAP FLUID SMOKE EVACUATOR (MISCELLANEOUS) ×1 IMPLANT
TRAY FOLEY MTR SLVR 16FR STAT (SET/KITS/TRAYS/PACK) ×1 IMPLANT
WATER STERILE IRR 500ML POUR (IV SOLUTION) ×1 IMPLANT

## 2022-11-03 NOTE — Progress Notes (Signed)
Patient ID: Joe Torres, male   DOB: 05-09-1946, 77 y.o.   MRN: [id number]     SURGICAL PROGRESS NOTE   Hospital Day(s): 14.   Interval History: Patient seen and examined, no acute events or new complaints overnight. Patient reports feeling better this morning.  He was able to have decent amount of output through the ileostomy overnight (550).  Vital signs in last 24 hours: [min-max] current  Temp:  [98 F (36.7 C)-98.4 F (36.9 C)] 98 F (36.7 C) (06/22 2203) Pulse Rate:  [91-97] 91 (06/22 2203) Resp:  [16-20] 20 (06/22 2203) BP: (124-151)/(75-92) 134/75 (06/22 2203) SpO2:  [96 %-98 %] 98 % (06/22 2203)     Height: 5\' 9"  (175.3 cm) Weight: 69.8 kg BMI (Calculated): 22.71   Physical Exam:  Constitutional: alert, cooperative and no distress  Respiratory: breathing non-labored at rest  Cardiovascular: regular rate and sinus rhythm  Gastrointestinal: soft, non-tender, and mild-distended.  The ileostomy is pink and patent.  Labs:     Latest Ref Rng & Units 11/02/2022    5:16 AM 11/01/2022    5:26 AM 10/31/2022    4:46 AM  CBC  WBC 4.0 - 10.5 K/uL 16.5  16.8  17.8   Hemoglobin 13.0 - 17.0 g/dL 40.9  81.1  91.4   Hematocrit 39.0 - 52.0 % 40.8  40.0  40.6   Platelets 150 - 400 K/uL 256  243  277       Latest Ref Rng & Units 11/03/2022    5:09 AM 11/02/2022    5:16 AM 11/01/2022    5:26 AM  CMP  Glucose 70 - 99 mg/dL 782  93  956   BUN 8 - 23 mg/dL 13  14  12    Creatinine 0.61 - 1.24 mg/dL 2.13  0.86  5.78   Sodium 135 - 145 mmol/L 143  141  139   Potassium 3.5 - 5.1 mmol/L 4.0  3.5  3.3   Chloride 98 - 111 mmol/L 99  97  95   CO2 22 - 32 mmol/L 37  35  38   Calcium 8.9 - 10.3 mg/dL 8.9  8.8  8.6     Imaging studies: No new pertinent imaging studies   Assessment/Plan:  77 y.o. male with perforated diverticulitis 14 Day Post-Op s/p partial colectomy with anastomosis and loop ileostomy creation, complicated by pertinent comorbidities including severe COPD, history of bladder  cancer, hypertension.   -Despite having increased amount of ileostomy output overnight again I discussed with patient to take him to the operating room for ileostomy revision.  This has been happening for the last 3 to 4 days where he has episode of good ileostomy output that always leads to recurrent obstruction. -I think that if around to the surgery today he is going to get obstructed again during the day. -My goal of the surgery is to do an ileostomy revision and make sure that there is no torsion of the anastomosis or hernia.  I will try to avoid exploratory laparotomy but is always a possibility -I discussed with patient the risk of surgery including bleeding, infection, enterocutaneous fistula, injury to adjacent organ, perforation, obstruction, among others.  The patient reports she understood and agreed to proceed with surgery.  Gae Gallop, MD

## 2022-11-03 NOTE — Anesthesia Postprocedure Evaluation (Signed)
Anesthesia Post Note  Patient: Joe Torres  Procedure(s) Performed: ILEOSTOMY  Patient location during evaluation: PACU Anesthesia Type: General Level of consciousness: awake and alert Pain management: pain level controlled Vital Signs Assessment: post-procedure vital signs reviewed and stable Respiratory status: spontaneous breathing, nonlabored ventilation, respiratory function stable and patient connected to nasal cannula oxygen Cardiovascular status: blood pressure returned to baseline and stable Postop Assessment: no apparent nausea or vomiting Anesthetic complications: no   No notable events documented.   Last Vitals:  Vitals:   11/03/22 1115 11/03/22 1150  BP: (!) 150/75 (!) 147/87  Pulse: 85 86  Resp: 16   Temp:  36.5 C  SpO2: 97% 100%    Last Pain:  Vitals:   11/03/22 1114  TempSrc:   PainSc: 4                  Cleda Mccreedy Myrle Wanek

## 2022-11-03 NOTE — Op Note (Signed)
Preoperative diagnosis: Small bowel obstruction  Postoperative diagnosis: Small bowel obstruction  Procedure: Ileostomy revision and conversion from loop ileostomy to double barrel ileostomy  Anesthesia: GETA  Surgeon: Dr. Hazle Quant, MD  Wound Classification: Dirty  Indications:  Patient is a 77 y.o. male with history of protective loop ileostomy created two weeks ago.  He has been having intermittent obstruction and CT scan was showing that the obstruction was at the point of the ileostomy.  Description of procedure:  The patient was placed in the supine position and general endotracheal anesthesia was induced. A time-out was completed verifying correct patient, procedure, site, positioning, and implant(s) and/or special equipment prior to beginning this procedure. Preoperative antibiotics were given. The abdomen was prepped and draped in the usual sterile fashion.   The ileostomy mucosa was separated from the skin.  The ileostomy wound was extended medially for better exposure.  The loop ileostomy was able to be identified with the redundant distal small bowel causing compression to the proximal  small bowel.  It was identified as a cause of the intermittent obstruction.  At this point was decided to convert the loop ileostomy to a double barrel ileostomy.  The proximal and distal small segment of the ileostomy were resected with GIA.  The proximal intestine was fixed to the skin as well as a distal piece of intestine and both pieces of intestine were fixed to each other at the middle.  Adequate digitalization of both, proximal and distal ostomies without resistance. An ostomy bag was applied.  The patient tolerated the procedure well and was taken to the postanesthesia care unit in stable condition.   Specimen: Loop ileostomy  Complications: None  EBL: 10 mL

## 2022-11-03 NOTE — Plan of Care (Signed)
  Problem: Pain Managment: Goal: General experience of comfort will improve Outcome: Progressing   Problem: Safety: Goal: Ability to remain free from injury will improve Outcome: Progressing   Problem: Skin Integrity: Goal: Risk for impaired skin integrity will decrease Outcome: Progressing   

## 2022-11-03 NOTE — Progress Notes (Signed)
Progress Note   Patient: Joe Torres ZOX:[id number] DOB: 02/20/1946 DOA: 10/20/2022     14 DOS: the patient was seen and examined on 11/03/2022    Subjective: Patient seen and examined Plan of care discussed with patient's family Patient was taken for the OR again today Patient underwent ileostomy revision and conversion from loop ileostomy to double barrel ileostomy  Brief hospital course: From HPI "Joe Torres is a 77 y.o. male with medical history significant of chronic respiratory failure on 2 to 3 L, COPD, GERD, hypertension, history of bladder cancer in remission presenting with abdominal pain, perforated diverticulitis, COPD exacerbation, acute on chronic respiratory failure hypoxia.  Patient ports increased work of breathing over the past 4 to 5 days.  Positive wheezing cough, increased sputum production.  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  Positive increased work of breathing despite inhaler use.  No reported sick contacts.  No fevers or chills.  No hemiparesis or confusion.  Patient also ports worsening lower abdominal pain over the past 3 days.  Mild nausea but no vomiting.  No reported diarrhea.  No reported recent dietary changes.  Prior surgical history includes transurethral resection of bladder tumor in 2022 as well as chemoradiation to the area.  Noted prior colonoscopy September 2022 with reported diverticulosis.  Also with hernia repair in 2017. Presented to the ER afebrile, hemodynamically stable, satting 9 9% on 3 L.  White count 12.4, hemoglobin 14.3, platelets 348, creatinine 1.11, urinalysis within normal limits.  CTA of the chest as well as CT of the abdomen pelvis obtained showing severe centrilobular emphysema concerning for COPD as well as small to moderate pneumoperitoneum with concern for perforated sigmoid diverticulitis without abscess.  Dr. Maia Plan with general surgery evaluating patient with plan for  operative repair today."   Hospital course complicated by post-op ileus, now recurrent.  Requiring NG tube for decompression.          Assessment and Plan:   Diverticulitis of colon with perforation s/p surgery Noted lower abdominal pain x 2 to 3 days with diverticulitis with perforation on CT scan Patient underwent low anterior resection with primary anastomosis and diverting loop ileostomy creation by surgeon done on 10/20/2022 Patient was taken for the OR again on 11/03/2022 and patient underwent ileostomy revision and conversion from loop ileostomy to double barrel ileostomy --Dr. Maia Plan, general surgery following we appreciate input --Completed 7 days course IV Zosyn   CT scan of the abdomen results showing severe small bowel obstruction Continue supportive care and pain control -- Continue ostomy teaching / training Continue current pain medication 6/14: changed oxycodone to oral dilaudid (pt reports feeling more short of breath and tight in his chest after oxycodone) Continue laxatives    Postoperative Ileus -- Recurrent. Continue postoperative management as recommended by surgeon    Acute on chronic respiratory failure with hypoxia (HCC) Severe COPD/Emphysema with Acute Exacerbation Acute decompensated respiratory failure in the setting of COPD exacerbation with cough wheezing, increased sputum production.  CT scan chest showed severe emphysema. Baseline home O2 use around 2-3L.  Stable on 3 L/min here. Patient completed 5 days course of 40 mg prednisone and then was initiated on IV Solu-Medrol 40 twice daily and currently on 40 daily We will begin to taper down steroid from tomorrow Continue nebulization --Continue neb treatments, Daliresp --Changed albuterol nebs >> Xopenex due to tachycardia -- Continue to hold prednisone 10 mg daily  Hypokalemia - replaced 6/12, 6/14. Continue repletion and monitoring Potassium found to be 3.3 on 11/01/2022   Malignant neoplasm of  overlapping sites of bladder [facility]) Noted prior history of high-grade urothelial carcinoma  s/p concurrent chemoradiation with 5-FU mitomycin ending in March 2022  No evidence of disease per 05/2022 Rad-onc note Continue to monitor closely-outpatient follow-up with oncologist   Hypertension BP stable Continue amlodipine hydrochlorothiazide and losartan when able to take p.o. meds Continue as needed IV hydralazine Avoiding beta-blockers due to severe emphysema/COPD   Hyperlipidemia Resume statin therapy when able to take oral  GERD (gastroesophageal reflux disease) Continue IV Protonix due to reflux   BPH (benign prostatic hyperplasia) 6/12-13: pt having issues voiding, weak stream, difficulty initiating stream, sensation of incomplete emptying Continue Flomax --Monitor post-void residuals by bladder scan --Added Proscar 6/13 6/14: pt reports improved stream with voiding       Advance Care Planning:   Code Status: Full Code    Consults: General Surgery w/ Dr. Maia Plan    Family Communication: Plan of care DISCUSSED with patient's family present at bedside      Physical Exam:   General exam: Awake in some distress requiring intranasal oxygen HEENT: moist mucus membranes, hearing grossly normal  Respiratory system: Wheezing resolved Cardiovascular system: normal S1/S2, RRR, no pedal edema.   Gastrointestinal system: Minimal tenderness around ileostomy site Central nervous system: A&O x 3. no gross focal neurologic deficits, normal speech Extremities: moves all, no edema, normal tone Skin: dry, intact, normal temperature Psychiatry: normal mood, congruent affect, judgement and insight appear normal       Data Reviewed: I reviewed patient's labs showing sodium 143 potassium 4.0 creatinine 0.6 wbc 16.5       Vitals:   11/03/22 1114 11/03/22 1115 11/03/22 1150 11/03/22 1618  BP:  (!) 150/75 (!) 147/87 (!) 155/82  Pulse: 84 85 86 91  Resp: 15 16  16   Temp: (!) 97.2 F  (36.2 C)  97.7 F (36.5 C) 98.3 F (36.8 C)  TempSrc:      SpO2: 98% 97% 100% 95%  Weight:      Height:         Author: Loyce Dys, MD 11/03/2022 5:23 PM  For on call review [email or web address].

## 2022-11-03 NOTE — Anesthesia Procedure Notes (Signed)
Procedure Name: Intubation Date/Time: 11/03/2022 8:31 AM  Performed by: Ginger Carne, CRNAPre-anesthesia Checklist: Patient identified, Emergency Drugs available, Suction available, Patient being monitored and Timeout performed Patient Re-evaluated:Patient Re-evaluated prior to induction Oxygen Delivery Method: Circle system utilized Preoxygenation: Pre-oxygenation with 100% oxygen Induction Type: IV induction, Rapid sequence and Cricoid Pressure applied Laryngoscope Size: McGraph and 3 Grade View: Grade II Tube type: Oral Tube size: 7.0 mm Number of attempts: 1 Airway Equipment and Method: Stylet and Video-laryngoscopy Placement Confirmation: positive ETCO2, ETT inserted through vocal cords under direct vision and breath sounds checked- equal and bilateral Secured at: 22 cm Tube secured with: Tape Dental Injury: Teeth and Oropharynx as per pre-operative assessment

## 2022-11-03 NOTE — Anesthesia Preprocedure Evaluation (Addendum)
Anesthesia Evaluation  Patient identified by MRN, date of birth, ID band Patient awake    Reviewed: Allergy & Precautions, NPO status , Patient's Chart, lab work & pertinent test results  History of Anesthesia Complications Negative for: history of anesthetic complications  Airway Mallampati: III  TM Distance: <3 FB Neck ROM: full    Dental  (+) Missing, Poor Dentition   Pulmonary shortness of breath, asthma , sleep apnea , COPD,  COPD inhaler and oxygen dependent, former smoker   + rhonchi  + decreased breath sounds  rales    Cardiovascular Exercise Tolerance: Good hypertension, (-) angina Normal cardiovascular exam     Neuro/Psych  Neuromuscular disease  negative psych ROS   GI/Hepatic Neg liver ROS,GERD  Controlled,,  Endo/Other  negative endocrine ROS    Renal/GU      Musculoskeletal   Abdominal   Peds  Hematology negative hematology ROS (+)   Anesthesia Other Findings Past Medical History: No date: Arthritis No date: Asthma No date: Bladder cancer (HCC) No date: Cancer (HCC) No date: COPD (chronic obstructive pulmonary disease) (HCC) No date: Dyspnea No date: GERD (gastroesophageal reflux disease) No date: Hypertension  Past Surgical History: No date: COLONOSCOPY 01/16/2021: COLONOSCOPY WITH PROPOFOL; N/A     Comment:  Procedure: COLONOSCOPY WITH PROPOFOL;  Surgeon: Wyline Mood, MD;  Location: [facility] ENDOSCOPY;  Service:               Gastroenterology;  Laterality: N/A;  SPANISH               INTERPRETER 9 AM ARRIVAL REQUEST 2017: HERNIA REPAIR 10/20/2022: LAPAROTOMY; N/A     Comment:  Procedure: EXPLORATORY LAPAROTOMY,  SIGMOID COLECTOMY,               LOOP ILEOSTOMY;  Surgeon: Carolan Shiver, MD;                Location: [facility] ORS;  Service: General;  Laterality: N/A; 06/19/2020: PORTA CATH INSERTION; N/A     Comment:  Procedure: PORTA CATH INSERTION;  Surgeon: Annice Needy,               MD;  Location: [facility] INVASIVE CV LAB;  Service:               Cardiovascular;  Laterality: N/A; 07/2020: TRANSURETHRAL RESECTION OF BLADDER TUMOR WITH GYRUS (TURBT- GYRUS) 05/22/2020: TRANSURETHRAL RESECTION OF BLADDER TUMOR WITH MITOMYCIN- C; N/A     Comment:  Procedure: TRANSURETHRAL RESECTION OF BLADDER TUMOR WITH              Gemcitabine;  Surgeon: Vanna Scotland, MD;  Location:               [facility] ORS;  Service: Urology;  Laterality: N/A;  BMI    Body Mass Index: 22.72 kg/m      Reproductive/Obstetrics negative OB ROS                             Anesthesia Physical Anesthesia Plan  ASA: 4  Anesthesia Plan: General ETT and Rapid Sequence   Post-op Pain Management:    Induction: Intravenous  PONV Risk Score and Plan: Ondansetron, Dexamethasone, Midazolam and Treatment may vary due to age or medical condition  Airway Management Planned: Oral ETT  Additional Equipment:   Intra-op Plan:   Post-operative Plan: Extubation in OR and Possible Post-op intubation/ventilation  Informed Consent: I have reviewed the patients History and Physical, chart, labs and discussed the procedure including the risks, benefits and alternatives for the proposed anesthesia with the patient or authorized representative who has indicated his/her understanding and acceptance.     Dental Advisory Given and Interpreter used for interveiw  Plan Discussed with: Anesthesiologist, CRNA and Surgeon  Anesthesia Plan Comments: (Patient and son consented for risks of anesthesia including but not limited to:  - adverse reactions to medications - damage to eyes, teeth, lips or other oral mucosa - nerve damage due to positioning  - sore throat or hoarseness - Damage to heart, brain, nerves, lungs, other parts of body or loss of life  They voiced understanding.)       Anesthesia Quick Evaluation

## 2022-11-03 NOTE — Transfer of Care (Signed)
Immediate Anesthesia Transfer of Care Note  Patient: Joe Torres  Procedure(s) Performed: ILEOSTOMY  Patient Location: PACU  Anesthesia Type:General  Level of Consciousness: awake and drowsy  Airway & Oxygen Therapy: Patient Spontanous Breathing and Patient connected to face mask oxygen  Post-op Assessment: Report given to RN and Post -op Vital signs reviewed and stable  Post vital signs: Reviewed and stable  Last Vitals:  Vitals Value Taken Time  BP 157/80 11/03/22 1026  Temp    Pulse 78 11/03/22 1026  Resp 23 11/03/22 1026  SpO2 100 % 11/03/22 1026    Last Pain:  Vitals:   11/02/22 2155  TempSrc:   PainSc: Asleep      Patients Stated Pain Goal: 0 (10/31/22 1402)  Complications: No notable events documented.

## 2022-11-04 ENCOUNTER — Inpatient Hospital Stay: Payer: Medicare HMO

## 2022-11-04 ENCOUNTER — Encounter: Payer: Self-pay | Admitting: General Surgery

## 2022-11-04 DIAGNOSIS — K5720 Diverticulitis of large intestine with perforation and abscess without bleeding: Secondary | ICD-10-CM | POA: Diagnosis not present

## 2022-11-04 LAB — BASIC METABOLIC PANEL
Anion gap: 8 (ref 5–15)
BUN: 13 mg/dL (ref 8–23)
CO2: 36 mmol/L — ABNORMAL HIGH (ref 22–32)
Calcium: 8.7 mg/dL — ABNORMAL LOW (ref 8.9–10.3)
Chloride: 100 mmol/L (ref 98–111)
Creatinine, Ser: 0.61 mg/dL (ref 0.61–1.24)
GFR, Estimated: >60 mL/min (ref >60)
Glucose, Bld: 90 mg/dL (ref 70–99)
Potassium: 3.5 mmol/L (ref 3.5–5.1)
Sodium: 144 mmol/L (ref 135–145)

## 2022-11-04 LAB — CBC WITH DIFFERENTIAL/PLATELET
Abs Immature Granulocytes: 0.11 10*3/uL — ABNORMAL HIGH (ref 0.00–0.07)
Basophils Absolute: 0 10*3/uL (ref 0.0–0.1)
Basophils Relative: 0 %
Eosinophils Absolute: 0 10*3/uL (ref 0.0–0.5)
Eosinophils Relative: 0 %
HCT: 41.1 % (ref 39.0–52.0)
Hemoglobin: 12.9 g/dL — ABNORMAL LOW (ref 13.0–17.0)
Immature Granulocytes: 1 %
Lymphocytes Relative: 3 %
Lymphs Abs: 0.4 10*3/uL — ABNORMAL LOW (ref 0.7–4.0)
MCH: 29.9 pg (ref 26.0–34.0)
MCHC: 31.4 g/dL (ref 30.0–36.0)
MCV: 95.1 fL (ref 80.0–100.0)
Monocytes Absolute: 1.1 10*3/uL — ABNORMAL HIGH (ref 0.1–1.0)
Monocytes Relative: 8 %
Neutro Abs: 13.4 10*3/uL — ABNORMAL HIGH (ref 1.7–7.7)
Neutrophils Relative %: 88 %
Platelets: 238 10*3/uL (ref 150–400)
RBC: 4.32 MIL/uL (ref 4.22–5.81)
RDW: 13.1 % (ref 11.5–15.5)
WBC: 15.1 10*3/uL — ABNORMAL HIGH (ref 4.0–10.5)
nRBC: 0 % (ref 0.0–0.2)

## 2022-11-04 NOTE — Progress Notes (Signed)
Patient ID: Joe Torres, male   DOB: 08/07/1945, 77 y.o.   MRN: [id number]     SURGICAL PROGRESS NOTE   Hospital Day(s): 15.   Interval History: Patient seen and examined, no acute events or new complaints overnight. Patient reports feeling okay this morning.  He had a good night with adequate ileostomy output.  There is abundant amount of enteric content in the ileostomy bag.  Vital signs in last 24 hours: [min-max] current  Temp:  [97.2 F (36.2 C)-98.7 F (37.1 C)] 97.8 F (36.6 C) (06/24 0507) Pulse Rate:  [78-92] 89 (06/24 0507) Resp:  [15-23] 18 (06/24 0507) BP: (136-157)/(72-87) 143/72 (06/24 0507) SpO2:  [89 %-100 %] 99 % (06/24 0507) FiO2 (%):  [32 %] 32 % (06/23 2113)     Height: 5\' 9"  (175.3 cm) Weight: 69.8 kg BMI (Calculated): 22.71   Physical Exam:  Constitutional: alert, cooperative and no distress  Respiratory: breathing non-labored at rest  Cardiovascular: regular rate and sinus rhythm  Gastrointestinal: soft, non-tender, and non-distended.  Ileostomy is pink and patent.  Labs:     Latest Ref Rng & Units 11/04/2022    6:45 AM 11/02/2022    5:16 AM 11/01/2022    5:26 AM  CBC  WBC 4.0 - 10.5 K/uL 15.1  16.5  16.8   Hemoglobin 13.0 - 17.0 g/dL 78.2  95.6  21.3   Hematocrit 39.0 - 52.0 % 41.1  40.8  40.0   Platelets 150 - 400 K/uL 238  256  243       Latest Ref Rng & Units 11/03/2022    5:09 AM 11/02/2022    5:16 AM 11/01/2022    5:26 AM  CMP  Glucose 70 - 99 mg/dL 086  93  578   BUN 8 - 23 mg/dL 13  14  12    Creatinine 0.61 - 1.24 mg/dL 4.69  6.29  5.28   Sodium 135 - 145 mmol/L 143  141  139   Potassium 3.5 - 5.1 mmol/L 4.0  3.5  3.3   Chloride 98 - 111 mmol/L 99  97  95   CO2 22 - 32 mmol/L 37  35  38   Calcium 8.9 - 10.3 mg/dL 8.9  8.8  8.6     Imaging studies: No new pertinent imaging studies   Assessment/Plan:  77 y.o. male with perforated diverticulitis 15 Day Post-Op s/p partial colectomy with anastomosis and loop ileostomy creation, complicated by  pertinent comorbidities including severe COPD, history of bladder cancer, hypertension.   -Status post revision of ileostomy yesterday. -Patient this morning is doing well.  He has great amount of output through the ileostomy overnight. -Will try to do nasogastric tube clamp trial and clear liquid diet trial again -Encouraged to ambulate -Continue management of medical comorbidities -Will follow-up closely  Gae Gallop, MD

## 2022-11-04 NOTE — Progress Notes (Signed)
Progress Note   Patient: Joe Torres:[id number] DOB: 1945-07-25 DOA: 10/20/2022     15 DOS: the patient was seen and examined on 11/04/2022      Subjective: Patient seen and examined in the presence of the family Denies nausea or vomiting Abdominal pain much better Respiratory function improved  Brief hospital course: From HPI "Joe Torres is a 77 y.o. male with medical history significant of chronic respiratory failure on 2 to 3 L, COPD, GERD, hypertension, history of bladder cancer in remission presenting with abdominal pain, perforated diverticulitis, COPD exacerbation, acute on chronic respiratory failure hypoxia.  Patient ports increased work of breathing over the past 4 to 5 days.  Positive wheezing cough, increased sputum production.  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  Positive increased work of breathing despite inhaler use.  No reported sick contacts.  No fevers or chills.  No hemiparesis or confusion.  Patient also ports worsening lower abdominal pain over the past 3 days.  Mild nausea but no vomiting.  No reported diarrhea.  No reported recent dietary changes.  Prior surgical history includes transurethral resection of bladder tumor in 2022 as well as chemoradiation to the area.  Noted prior colonoscopy September 2022 with reported diverticulosis.  Also with hernia repair in 2017. Presented to the ER afebrile, hemodynamically stable, satting 9 9% on 3 L.  White count 12.4, hemoglobin 14.3, platelets 348, creatinine 1.11, urinalysis within normal limits.  CTA of the chest as well as CT of the abdomen pelvis obtained showing severe centrilobular emphysema concerning for COPD as well as small to moderate pneumoperitoneum with concern for perforated sigmoid diverticulitis without abscess.  Dr. Maia Plan with general surgery evaluating patient with plan for operative repair today."   Hospital course complicated by post-op  ileus, now recurrent.  Requiring NG tube for decompression.          Assessment and Plan:   Diverticulitis of colon with perforation s/p surgery Noted lower abdominal pain x 2 to 3 days with diverticulitis with perforation on CT scan Patient underwent low anterior resection with primary anastomosis and diverting loop ileostomy creation by surgeon done on 10/20/2022 Patient was taken for the OR again on 11/03/2022 and patient underwent ileostomy revision and conversion from loop ileostomy to double barrel ileostomy --Dr. Maia Plan, general surgery following we appreciate input --Completed 7 days course IV Zosyn   CT scan of the abdomen results showing severe small bowel obstruction Continue supportive care and pain control -- Continue ostomy teaching / training Continue current pain medication 6/14: changed oxycodone to oral dilaudid (pt reports feeling more short of breath and tight in his chest after oxycodone) Continue laxative as recommended by surgeon     Postoperative Ileus -- Recurrent. Continue postoperative management as recommended by surgeon     Acute on chronic respiratory failure with hypoxia (HCC) Severe COPD/Emphysema with Acute Exacerbation Acute decompensated respiratory failure in the setting of COPD exacerbation with cough wheezing, increased sputum production.  CT scan chest showed severe emphysema. Baseline home O2 use around 2-3L.  Stable on 3 L/min here. Patient completed 5 days course of 40 mg prednisone and then was initiated on IV Solu-Medrol 40 twice daily and currently on 40 daily Begin to taper down steroid Continue nebulization as needed --Continue neb treatments, Daliresp --Changed albuterol nebs >> Xopenex due to tachycardia -- Continue to hold prednisone 10 mg daily    Hypokalemia -  replaced 6/12, 6/14. Continue to replete and monitor potassium level Potassium found to be 3.3 on 11/01/2022   Malignant neoplasm of overlapping sites of bladder  [facility]) Noted prior history of high-grade urothelial carcinoma  s/p concurrent chemoradiation with 5-FU mitomycin ending in March 2022  No evidence of disease per 05/2022 Rad-onc note Continue to monitor closely-outpatient follow-up with oncologist   Hypertension BP stable Continue amlodipine hydrochlorothiazide and losartan when able to take orals Continue as needed IV hydralazine Avoiding beta-blockers due to severe emphysema/COPD   Hyperlipidemia Resume statin therapy when able to take oral   GERD (gastroesophageal reflux disease) Continue IV Protonix due to reflux   BPH (benign prostatic hyperplasia) 6/12-13: pt having issues voiding, weak stream, difficulty initiating stream, sensation of incomplete emptying Continue Flomax --Monitor post-void residuals by bladder scan --Added Proscar 6/13 6/14: pt reports improved stream with voiding       Advance Care Planning:   Code Status: Full Code    Consults: General Surgery w/ Dr. Maia Plan    Family Communication: Plan of care DISCUSSED with patient's family present at bedside      Physical Exam:   General exam: Awake in some distress requiring intranasal oxygen HEENT: moist mucus membranes, hearing grossly normal  Respiratory system: Wheezing resolved Cardiovascular system: normal S1/S2, RRR, no pedal edema.   Gastrointestinal system: Minimal tenderness around ileostomy site Central nervous system: A&O x 3. no gross focal neurologic deficits, normal speech Extremities: moves all, no edema, normal tone Skin: dry, intact, normal temperature Psychiatry: normal mood, congruent affect, judgement and insight appear normal       Data Reviewed: Labs reviewed today showing sodium 144 potassium 3.9, WBC 15     Vitals:   11/04/22 0049 11/04/22 0507 11/04/22 0830 11/04/22 1725  BP: (!) 152/79 (!) 143/72 (!) 150/70 131/79  Pulse: 88 89 93 91  Resp: 18 18 20 16   Temp: 98.7 F (37.1 C) 97.8 F (36.6 C) 97.7 F (36.5 C) 98.4 F  (36.9 C)  TempSrc:      SpO2: 99% 99% 100% 99%  Weight:      Height:         Author: Loyce Dys, MD 11/04/2022 6:00 PM  For on call review [email or web address].

## 2022-11-04 NOTE — Evaluation (Signed)
Occupational Therapy Re-Evaluation Patient Details Name: Joe Torres MRN: [id number] DOB: [birth year] Today's Date: 11/04/2022   History of Present Illness Joe Torres is a 77 year old male with a past medical history of asthma/COPD overlap, who presented to the hospital with abdominal pain. Being treated for diverticulitis of colon with perforation.   Clinical Impression   Patient received for OT re-evaluation. See flowsheet below for details of function. Generally, patient requiring  supervision with RW (and management of lines) for functional mobility, and MIN A for ADLs. Patient will benefit from continued OT while in acute care.       Recommendations for follow up therapy are one component of a multi-disciplinary discharge planning process, led by the attending physician.  Recommendations may be updated based on patient status, additional functional criteria and insurance authorization.   Assistance Recommended at Discharge Intermittent Supervision/Assistance  Patient can return home with the following A little help with walking and/or transfers;A little help with bathing/dressing/bathroom;Help with stairs or ramp for entrance;Assistance with cooking/housework;Assist for transportation    Functional Status Assessment  Patient has had a recent decline in their functional status and demonstrates the ability to make significant improvements in function in a reasonable and predictable amount of time.  Equipment Recommendations  None recommended by OT    Recommendations for Other Services       Precautions / Restrictions Precautions Precautions: Fall (new ostomy; abdominal) Precaution Comments: O2 Restrictions Weight Bearing Restrictions: No      Mobility Bed Mobility               General bed mobility comments: Pt received seated at EOB; left at EOB at end of session; family in room.    Transfers Overall transfer level: Needs assistance Equipment used: Rolling walker (2  wheels) Transfers: Sit to/from Stand Sit to Stand: Min guard (OT holding down RW as pt pulled up with one hand (other hand on bed))                  Balance Overall balance assessment: Mild deficits observed, not formally tested                                         ADL either performed or assessed with clinical judgement   ADL Overall ADL's : Needs assistance/impaired                                       General ADL Comments: Pt unable to make figure four position fully with BIL LE, R worse than L; however, pt's family states that wife assists with bathing, dressing, and any other ADLs as needed at baseline 2/2 COPD shortness of breath. Wife assisted today in bathroom with standing bathing and standing urinating at toilet. Pt requiring rest breaks throughout mobility today 2/2 shortness of breath.     Vision         Perception     Praxis      Pertinent Vitals/Pain Pain Assessment Pain Assessment: 0-10 Pain Score: 7  Pain Location: abomen Pain Descriptors / Indicators: Aching Pain Intervention(s): Limited activity within patient's tolerance, Monitored during session     Hand Dominance     Extremity/Trunk Assessment Upper Extremity Assessment Upper Extremity Assessment: Overall WFL for tasks assessed   Lower Extremity Assessment Lower  Extremity Assessment: Overall WFL for tasks assessed   Cervical / Trunk Assessment Cervical / Trunk Assessment: Normal   Communication Communication Communication: Prefers language other than English (spanish; interpreter via tablet utilized during session Donald Pore ID [phone]))   Cognition Arousal/Alertness: Awake/alert Behavior During Therapy: WFL for tasks assessed/performed Overall Cognitive Status: Within Functional Limits for tasks assessed                                 General Comments: Follows all cues; able to self-pace mobility to compensate for shortness of breath      General Comments  Pt on 3L O2 throughout session. Agreeable to mobility in hallway; walked one lap around unit with OT managing O2 tank and IV pole (another assistant managing language line tablet throughout session); pt using RW with supervision. At rest, O2 on 3L 95%, HR 101. Pt paused approx 2 minutes into mobility for standing rest break, O2 93% HR 110. Pt took one further rest break prior to returning to room (O2 93%); at conclusion of walk, O2 improving to 95% with seated rest. Pt then requesting toileting and sponge bathing in bathroom with wife assist only. OT ensured pt safely mobilized to standing at grab bar in front of toilet, then wife assisting with bathroom ADLs at pt request. Pt then returned to seated EOB. 4 family members in the room.    Exercises     Shoulder Instructions      Home Living Family/patient expects to be discharged to:: Private residence Living Arrangements: Spouse/significant other Available Help at Discharge: Family Type of Home: House Home Access: Stairs to enter Secretary/administrator of Steps: 5   Home Layout: One level     Bathroom Shower/Tub: Engineer, production Accessibility: Yes How Accessible: Accessible via walker Home Equipment: Rolling Walker (2 wheels);Cane - single point;Shower seat   Additional Comments: Wife and other family can assist PRN once home      Prior Functioning/Environment Prior Level of Function : Needs assist       Physical Assist : Mobility (physical);ADLs (physical)   ADLs (physical): Bathing;Dressing   ADLs Comments: Pt obtains assistance from wife with ADLs due to frequnt bouts of SOB. She can continue to assist at d/c.        OT Problem List:        OT Treatment/Interventions: Self-care/ADL training;Therapeutic exercise;Energy conservation;DME and/or AE instruction    OT Goals(Current goals can be found in the care plan section) Acute Rehab OT Goals Patient Stated Goal: Go home OT Goal  Formulation: With patient Time For Goal Achievement: 11/18/22 Potential to Achieve Goals: Good ADL Goals Pt Will Perform Lower Body Dressing: with min assist;sit to/from stand  OT Frequency: Min 1X/week    Co-evaluation              AM-PAC OT "6 Clicks" Daily Activity     Outcome Measure Help from another person eating meals?: None Help from another person taking care of personal grooming?: A Little Help from another person toileting, which includes using toliet, bedpan, or urinal?: A Little Help from another person bathing (including washing, rinsing, drying)?: A Little Help from another person to put on and taking off regular upper body clothing?: None Help from another person to put on and taking off regular lower body clothing?: A Little 6 Click Score: 20   End of Session Equipment Utilized During Treatment: Oxygen;Rolling walker (2  wheels);Other (comment) (IV pole; language line interpreter) Nurse Communication: Mobility status  Activity Tolerance: Patient tolerated treatment well Patient left: in bed;with call bell/phone within reach;with family/visitor present (seated EOB)  OT Visit Diagnosis: Unsteadiness on feet (R26.81);Muscle weakness (generalized) (M62.81)                Time: 1053-1130 OT Time Calculation (min): 37 min Charges:  OT General Charges $OT Visit: 1 Visit OT Evaluation $OT Re-eval: 1 Re-eval OT Treatments $Self Care/Home Management : 8-22 mins $Therapeutic Activity: 8-22 mins  Linward Foster, MS, OTR/L  Alvester Morin 11/04/2022, 1:11 PM

## 2022-11-04 NOTE — Progress Notes (Addendum)
MD at bedside to eval. Verbal order to clamp ng tube and advance diet to clear liquid.  150 ml output over 12 hours.

## 2022-11-04 NOTE — Progress Notes (Signed)
PT Cancellation Note  Patient Details Name: Joe Torres MRN: [id number] DOB: 01/20/1946   Cancelled Treatment:    Reason Eval/Treat Not Completed: Fatigue/lethargy limiting ability to participate. Patients family reported that he had returned from walking a lap around the nurses station a little bit ago. Patient states that he is fatigued and would like to walk tomorrow. Family acted as Nurse, learning disability for this discussion due to ipad interpreter not present in room. Will re attempt tomorrow morning.   Malachi Carl, SPT   Malachi Carl 11/04/2022, 3:02 PM

## 2022-11-05 DIAGNOSIS — K5720 Diverticulitis of large intestine with perforation and abscess without bleeding: Secondary | ICD-10-CM | POA: Diagnosis not present

## 2022-11-05 LAB — CBC WITH DIFFERENTIAL/PLATELET
Abs Immature Granulocytes: 0.1 10*3/uL — ABNORMAL HIGH (ref 0.00–0.07)
Basophils Absolute: 0 10*3/uL (ref 0.0–0.1)
Basophils Relative: 0 %
Eosinophils Absolute: 0 10*3/uL (ref 0.0–0.5)
Eosinophils Relative: 0 %
HCT: 39.4 % (ref 39.0–52.0)
Hemoglobin: 12.3 g/dL — ABNORMAL LOW (ref 13.0–17.0)
Immature Granulocytes: 1 %
Lymphocytes Relative: 4 %
Lymphs Abs: 0.4 10*3/uL — ABNORMAL LOW (ref 0.7–4.0)
MCH: 29.5 pg (ref 26.0–34.0)
MCHC: 31.2 g/dL (ref 30.0–36.0)
MCV: 94.5 fL (ref 80.0–100.0)
Monocytes Absolute: 0.9 10*3/uL (ref 0.1–1.0)
Monocytes Relative: 8 %
Neutro Abs: 10.1 10*3/uL — ABNORMAL HIGH (ref 1.7–7.7)
Neutrophils Relative %: 87 %
Platelets: 235 10*3/uL (ref 150–400)
RBC: 4.17 MIL/uL — ABNORMAL LOW (ref 4.22–5.81)
RDW: 13 % (ref 11.5–15.5)
WBC: 11.6 10*3/uL — ABNORMAL HIGH (ref 4.0–10.5)
nRBC: 0 % (ref 0.0–0.2)

## 2022-11-05 LAB — BASIC METABOLIC PANEL
Anion gap: 8 (ref 5–15)
BUN: 8 mg/dL (ref 8–23)
CO2: 36 mmol/L — ABNORMAL HIGH (ref 22–32)
Calcium: 8.6 mg/dL — ABNORMAL LOW (ref 8.9–10.3)
Chloride: 99 mmol/L (ref 98–111)
Creatinine, Ser: 0.62 mg/dL (ref 0.61–1.24)
GFR, Estimated: >60 mL/min (ref >60)
Glucose, Bld: 112 mg/dL — ABNORMAL HIGH (ref 70–99)
Potassium: 3.2 mmol/L — ABNORMAL LOW (ref 3.5–5.1)
Sodium: 143 mmol/L (ref 135–145)

## 2022-11-05 MED ORDER — POTASSIUM CHLORIDE 20 MEQ PO PACK
40.0000 meq | PACK | Freq: Two times a day (BID) | ORAL | Status: DC
  Administered 2022-11-05 – 2022-11-06 (×3): 40 meq via ORAL
  Filled 2022-11-05 (×3): qty 2

## 2022-11-05 MED ORDER — PREDNISONE 20 MG PO TABS
40.0000 mg | ORAL_TABLET | Freq: Every day | ORAL | Status: DC
  Administered 2022-11-06: 40 mg via ORAL
  Filled 2022-11-05: qty 2

## 2022-11-05 NOTE — Progress Notes (Signed)
PT Cancellation Note  Patient Details Name: Joe Torres MRN: [id number] DOB: 1945/12/10   Cancelled Treatment:    Reason Eval/Treat Not Completed: Patient declined, no reason specified. Son interpreting for patient at this time. Stated that he was not feeling great at the moment and would appreciate if we could see him after lunch. Will re attempt with patient after lunch.   Malachi Carl, SPT   Malachi Carl 11/05/2022, 9:27 AM

## 2022-11-05 NOTE — Consult Note (Addendum)
WOC nurse attempted visit to assess new ostomy and provide additional education.  Patient sitting up in bed leaning over bedside table with eyes closed, does not respond to me being in room. Son is in room, states patient did not get much sleep last night and is not up to a visit at this time.  I will check later in the day.    ADDENDUM:  Did speak to daughters in hallway.  Plan is to meet daughters for teaching session regarding newly revised ostomy on 11/06/2022 at 0900.    Thank you,    Priscella Mann MSN, RN-BC, [employer] [phone]

## 2022-11-05 NOTE — Progress Notes (Addendum)
Progress Note   Patient: Joe Torres WGN:[id number] DOB: March 01, 1946 DOA: 10/20/2022     16 DOS: the patient was seen and examined on 11/05/2022         Subjective: Patient seen this morning Admits to improvement in abdominal pain Also has improvement in respiratory function   Brief hospital course: From HPI "Joe Torres is a 77 y.o. male with medical history significant of chronic respiratory failure on 2 to 3 L, COPD, GERD, hypertension, history of bladder cancer in remission presenting with abdominal pain, perforated diverticulitis, COPD exacerbation, acute on chronic respiratory failure hypoxia.  Patient ports increased work of breathing over the past 4 to 5 days.  Positive wheezing cough, increased sputum production.  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  Positive increased work of breathing despite inhaler use.  No reported sick contacts.  No fevers or chills.  No hemiparesis or confusion.  Patient also ports worsening lower abdominal pain over the past 3 days.  Mild nausea but no vomiting.  No reported diarrhea.  No reported recent dietary changes.  Prior surgical history includes transurethral resection of bladder tumor in 2022 as well as chemoradiation to the area.  Noted prior colonoscopy September 2022 with reported diverticulosis.  Also with hernia repair in 2017. Presented to the ER afebrile, hemodynamically stable, satting 9 9% on 3 L.  White count 12.4, hemoglobin 14.3, platelets 348, creatinine 1.11, urinalysis within normal limits.  CTA of the chest as well as CT of the abdomen pelvis obtained showing severe centrilobular emphysema concerning for COPD as well as small to moderate pneumoperitoneum with concern for perforated sigmoid diverticulitis without abscess.  Dr. Maia Plan with general surgery evaluating patient with plan for operative repair today."   Hospital course complicated by post-op ileus, now recurrent.   Requiring NG tube for decompression.          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 Maia Plan, general surgery following we appreciate input --Completed 7 days course IV Zosyn  Continue supportive care and pain control -- Continue ostomy teaching / training Continue current pain medication Currently on dilaudid for pain management.     Postoperative Ileus -- Recurrent. Continue postoperative management as recommended by surgeon     Acute on chronic respiratory failure with hypoxia (HCC) Severe COPD/Emphysema with Acute Exacerbation Acute decompensated respiratory failure in the setting of COPD exacerbation with cough wheezing, increased sputum production.  CT scan chest showed severe emphysema. Baseline home O2 use around 2-3L.  Stable on 3 L/min here. Patient completed 5 days course of 40 mg prednisone and then was initiated on IV Solu-Medrol 40 twice daily and currently on 40 daily until 11/05/2022 IV Solu-Medrol now transitioned to oral prednisone to be weaned off over 1 to 1.5 weeks interval Continue nebulization as needed Changed albuterol nebs >> Xopenex due to tachycardia    Hypokalemia  Continue potassium repletion and monitoring Potassium found to be 3.2 on 11/05/2022   Malignant neoplasm of overlapping sites of bladder [facility]) Noted prior  history of high-grade urothelial carcinoma  s/p concurrent chemoradiation with 5-FU mitomycin ending in March 2022  No evidence of disease per 05/2022 Rad-onc note Continue to monitor  closely-outpatient follow-up with oncologist   Hypertension BP stable Continue amlodipine hydrochlorothiazide and losartan  Continue as needed IV hydralazine Avoiding beta-blockers due to severe emphysema/COPD   Hyperlipidemia Resume statin therapy when able to take oral   GERD (gastroesophageal reflux disease) Continue Protonix due to reflux   BPH (benign prostatic hyperplasia) Continue Flomax and Proscar      Advance Care Planning:   Code Status: Full Code    Consults: General Surgery w/ Dr. Maia Plan    Family Communication: Plan of care DISCUSSED with patient's family present at bedside      Physical Exam:   General exam: Awake in some distress requiring intranasal oxygen HEENT: moist mucus membranes, hearing grossly normal  Respiratory system: Wheezing resolved Cardiovascular system: normal S1/S2, RRR, no pedal edema.   Gastrointestinal system: Minimal tenderness around ileostomy site Central nervous system: A&O x 3. no gross focal neurologic deficits, normal speech Extremities: moves all, no edema, normal tone Skin: dry, intact, normal temperature Psychiatry: normal mood, congruent affect, judgement and insight appear normal       Data Reviewed: I reviewed patient's labs today showing potassium 3.2 sodium 143     Vitals:   11/05/22 0500 11/05/22 0744 11/05/22 1600 11/05/22 1602  BP:  131/77 135/80 135/80  Pulse:  95 93 93  Resp:  19 18 18   Temp:  98.2 F (36.8 C) 98.2 F (36.8 C) 98.2 F (36.8 C)  TempSrc:      SpO2: 94% 98% 99% 99%  Weight:      Height:         Author: Loyce Dys, MD 11/05/2022 4:40 PM  For on call review [email or web address].

## 2022-11-05 NOTE — Progress Notes (Signed)
PT Cancellation Note  Patient Details Name: Joe Torres MRN: [id number] DOB: [birth year]   Cancelled Treatment:    Reason Eval/Treat Not Completed: Patient declined, no reason specified. Patient laying supine in bed with family in the room. Family operated as Nurse, learning disability for this session. Patient 8/10 pain currently and waiting for pain meds, requested that PT comes back later. Will re attempt tomorrow. Left note for mobility to pick up patient for ambulation if able.  Malachi Carl, SPT   Malachi Carl 11/05/2022, 1:35 PM

## 2022-11-05 NOTE — Progress Notes (Signed)
Patient ID: Joe Torres, male   DOB: 08-Mar-1946, 77 y.o.   MRN: [id number]     SURGICAL PROGRESS NOTE   Hospital Day(s): 16.   Interval History: Patient seen and examined, no acute events or new complaints overnight. Patient reports feeling well this morning.  He denies any clinical alteration.  He denies any nausea or vomiting.  Adequate ileostomy output throughout the day.  Vital signs in last 24 hours: [min-max] current  Temp:  [97.9 F (36.6 C)-98.4 F (36.9 C)] 98.2 F (36.8 C) (06/25 0744) Pulse Rate:  [87-95] 95 (06/25 0744) Resp:  [16-20] 19 (06/25 0744) BP: (131-146)/(74-79) 131/77 (06/25 0744) SpO2:  [94 %-100 %] 98 % (06/25 0744) FiO2 (%):  [32 %] 32 % (06/24 2027)     Height: 5\' 9"  (175.3 cm) Weight: 69.8 kg BMI (Calculated): 22.71   Physical Exam:  Constitutional: alert, cooperative and no distress  Respiratory: breathing non-labored at rest  Cardiovascular: regular rate and sinus rhythm  Gastrointestinal: soft, non-tender, and non-distended.  The ileostomy is pink and patent  Labs:     Latest Ref Rng & Units 11/05/2022    4:45 AM 11/04/2022    6:45 AM 11/02/2022    5:16 AM  CBC  WBC 4.0 - 10.5 K/uL 11.6  15.1  16.5   Hemoglobin 13.0 - 17.0 g/dL 27.2  53.6  64.4   Hematocrit 39.0 - 52.0 % 39.4  41.1  40.8   Platelets 150 - 400 K/uL 235  238  256       Latest Ref Rng & Units 11/05/2022    4:45 AM 11/04/2022    6:45 AM 11/03/2022    5:09 AM  CMP  Glucose 70 - 99 mg/dL 034  90  742   BUN 8 - 23 mg/dL 8  13  13    Creatinine 0.61 - 1.24 mg/dL 5.95  6.38  7.56   Sodium 135 - 145 mmol/L 143  144  143   Potassium 3.5 - 5.1 mmol/L 3.2  3.5  4.0   Chloride 98 - 111 mmol/L 99  100  99   CO2 22 - 32 mmol/L 36  36  37   Calcium 8.9 - 10.3 mg/dL 8.6  8.7  8.9     Imaging studies: No new pertinent imaging studies   Assessment/Plan:  77 y.o. male with perforated diverticulitis 16 Day Post-Op s/p partial colectomy with anastomosis and loop ileostomy creation, complicated  by pertinent comorbidities including severe COPD, history of bladder cancer, hypertension.   -Adequate ileostomy output throughout the day yesterday -No sign of clinical deterioration.  Adequate vital signs.  No fever. -White blood cell continue trending down -Will advance diet to full liquid diet and assess for toleration -Encourage patient to ambulate -Continue pain management -Continue medical management of comorbidities  Gae Gallop, MD

## 2022-11-06 DIAGNOSIS — K567 Ileus, unspecified: Secondary | ICD-10-CM | POA: Diagnosis not present

## 2022-11-06 DIAGNOSIS — K9189 Other postprocedural complications and disorders of digestive system: Secondary | ICD-10-CM

## 2022-11-06 DIAGNOSIS — J9621 Acute and chronic respiratory failure with hypoxia: Secondary | ICD-10-CM | POA: Diagnosis not present

## 2022-11-06 DIAGNOSIS — J441 Chronic obstructive pulmonary disease with (acute) exacerbation: Secondary | ICD-10-CM | POA: Insufficient documentation

## 2022-11-06 DIAGNOSIS — K5720 Diverticulitis of large intestine with perforation and abscess without bleeding: Secondary | ICD-10-CM | POA: Diagnosis not present

## 2022-11-06 LAB — BASIC METABOLIC PANEL
Anion gap: 7 (ref 5–15)
BUN: 7 mg/dL — ABNORMAL LOW (ref 8–23)
CO2: 36 mmol/L — ABNORMAL HIGH (ref 22–32)
Calcium: 8.5 mg/dL — ABNORMAL LOW (ref 8.9–10.3)
Chloride: 98 mmol/L (ref 98–111)
Creatinine, Ser: 0.51 mg/dL — ABNORMAL LOW (ref 0.61–1.24)
GFR, Estimated: >60 mL/min (ref >60)
Glucose, Bld: 106 mg/dL — ABNORMAL HIGH (ref 70–99)
Potassium: 3.8 mmol/L (ref 3.5–5.1)
Sodium: 141 mmol/L (ref 135–145)

## 2022-11-06 LAB — CBC WITH DIFFERENTIAL/PLATELET
Abs Immature Granulocytes: 0.05 10*3/uL (ref 0.00–0.07)
Basophils Absolute: 0 10*3/uL (ref 0.0–0.1)
Basophils Relative: 0 %
Eosinophils Absolute: 0 10*3/uL (ref 0.0–0.5)
Eosinophils Relative: 0 %
HCT: 39.6 % (ref 39.0–52.0)
Hemoglobin: 12.4 g/dL — ABNORMAL LOW (ref 13.0–17.0)
Immature Granulocytes: 1 %
Lymphocytes Relative: 5 %
Lymphs Abs: 0.5 10*3/uL — ABNORMAL LOW (ref 0.7–4.0)
MCH: 29.7 pg (ref 26.0–34.0)
MCHC: 31.3 g/dL (ref 30.0–36.0)
MCV: 95 fL (ref 80.0–100.0)
Monocytes Absolute: 0.8 10*3/uL (ref 0.1–1.0)
Monocytes Relative: 8 %
Neutro Abs: 7.9 10*3/uL — ABNORMAL HIGH (ref 1.7–7.7)
Neutrophils Relative %: 86 %
Platelets: 228 10*3/uL (ref 150–400)
RBC: 4.17 MIL/uL — ABNORMAL LOW (ref 4.22–5.81)
RDW: 13.1 % (ref 11.5–15.5)
WBC: 9.3 10*3/uL (ref 4.0–10.5)
nRBC: 0 % (ref 0.0–0.2)

## 2022-11-06 MED ORDER — PREDNISONE 20 MG PO TABS
20.0000 mg | ORAL_TABLET | Freq: Every day | ORAL | Status: DC
  Administered 2022-11-07: 20 mg via ORAL
  Filled 2022-11-06: qty 1

## 2022-11-06 MED ORDER — HYDROCODONE-ACETAMINOPHEN 5-325 MG PO TABS
1.0000 | ORAL_TABLET | Freq: Four times a day (QID) | ORAL | Status: DC | PRN
  Administered 2022-11-06 – 2022-11-07 (×2): 1 via ORAL
  Filled 2022-11-06 (×2): qty 1

## 2022-11-06 MED ORDER — LEVALBUTEROL HCL 0.63 MG/3ML IN NEBU
0.6300 mg | INHALATION_SOLUTION | Freq: Three times a day (TID) | RESPIRATORY_TRACT | Status: DC
  Administered 2022-11-06 – 2022-11-07 (×3): 0.63 mg via RESPIRATORY_TRACT
  Filled 2022-11-06 (×6): qty 3

## 2022-11-06 NOTE — Hospital Course (Signed)
Joe Torres is a 77 y.o. male with medical history significant of chronic respiratory failure on 2 to 3 L, COPD, GERD, hypertension, history of bladder cancer in remission presenting with abdominal pain, perforated diverticulitis, COPD exacerbation, acute on chronic respiratory failure hypoxia.  CTA of the chest as well as CT of the abdomen pelvis obtained showing severe centrilobular emphysema concerning for COPD as well as small to moderate pneumoperitoneum with concern for perforated sigmoid diverticulitis without abscess.  He was treated with steroids for COPD exacerbation. 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 Patient condition so far had improved, has significant output from ileostomy.  Tolerating soft diet well, respiratory status has back to baseline.  Medically stable to be discharged.

## 2022-11-06 NOTE — Progress Notes (Signed)
Patient ID: TOWNES FUHS, male   DOB: December 30, 1945, 77 y.o.   MRN: [id number]     SURGICAL PROGRESS NOTE   Hospital Day(s): 17.   Interval History: Patient seen and examined, no acute events or new complaints overnight. Patient reports feeling well this morning.  He denies any new complaint.  He denies any clinical deterioration.  He denies any nausea or vomiting.  He endorses continued passing gas and stool through ileostomy.  Vital signs in last 24 hours: [min-max] current  Temp:  [98.2 F (36.8 C)] 98.2 F (36.8 C) (06/25 2108) Pulse Rate:  [93-102] 102 (06/25 2108) Resp:  [18-20] 20 (06/25 2108) BP: (127-135)/(73-80) 127/73 (06/25 2108) SpO2:  [98 %-100 %] 100 % (06/25 2108)     Height: 5\' 9"  (175.3 cm) Weight: 69.8 kg BMI (Calculated): 22.71   Physical Exam:  Constitutional: alert, cooperative and no distress  Respiratory: breathing non-labored at rest  Cardiovascular: regular rate and sinus rhythm  Gastrointestinal: soft, non-tender, and non-distended.  Ileostomy is pink and patent.  Labs:     Latest Ref Rng & Units 11/06/2022    4:39 AM 11/05/2022    4:45 AM 11/04/2022    6:45 AM  CBC  WBC 4.0 - 10.5 K/uL 9.3  11.6  15.1   Hemoglobin 13.0 - 17.0 g/dL 56.4  33.2  95.1   Hematocrit 39.0 - 52.0 % 39.6  39.4  41.1   Platelets 150 - 400 K/uL 228  235  238       Latest Ref Rng & Units 11/06/2022    4:39 AM 11/05/2022    4:45 AM 11/04/2022    6:45 AM  CMP  Glucose 70 - 99 mg/dL 884  166  90   BUN 8 - 23 mg/dL 7  8  13    Creatinine 0.61 - 1.24 mg/dL 0.63  0.16  0.10   Sodium 135 - 145 mmol/L 141  143  144   Potassium 3.5 - 5.1 mmol/L 3.8  3.2  3.5   Chloride 98 - 111 mmol/L 98  99  100   CO2 22 - 32 mmol/L 36  36  36   Calcium 8.9 - 10.3 mg/dL 8.5  8.6  8.7     Imaging studies: No new pertinent imaging studies   Assessment/Plan:  77 y.o. male with perforated diverticulitis 17 Day Post-Op s/p partial colectomy with anastomosis and loop ileostomy creation, complicated by  pertinent comorbidities including severe COPD, history of bladder cancer, hypertension.   -Adequate vital signs.  No fever no tachycardia.  No clinical alteration -Continue adequate output through the ileostomy. -Tolerated full liquid diet.  Will advance to soft diet -White blood cell count normalized -I think the patient has turned the corner and now is improving significantly. -Case manager/social work reevaluation for discussion of home health alternatives -Continue PT/OT therapy  Gae Gallop, MD

## 2022-11-06 NOTE — TOC Progression Note (Signed)
Chart reviewed.  I have spoken with patient 's daughter Annia Friendly.  She informs me that she and her family would like to take her father home.  Annia Friendly has requested more information about the Home Health RN coming to the home to assist with ostomy care.  Annia Friendly reports that she and her family are learning how to do the ostomy care but wanted to know how much support home health could provide.    I have spoken with [place] from [place].  Patient is active with [facility].  [place] informs me that the nurse will assist with ostomy care and education in the home.  The number of nurse visits depends on [employer].  [place] reports the nurse can usually visit the home 2-3 times a week with 3 times being the maximum amount of visits for the nurse.  I have informed [place] that patient will require HH  for RN for Ostomy care, PT, OT, and in home aid. [place] reports that she will update patients plan of care.    TOC will follow patient for discharge planning.

## 2022-11-06 NOTE — TOC Progression Note (Signed)
Transition of Care [facility]) - Progression Note    Patient Details  Name: Joe Torres MRN: [id number] Date of Birth: 1945-06-13  Transition of Care [facility]) CM/SW Contact  Garret Reddish, RN Phone Number: 11/06/2022, 3:05 PM  Clinical Narrative:   Chart reviewed.  Noted that patient was admitted with Diverticulitis of colon with perforation s/p surgery.  Patient mall bowel obstruction.   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    Patient continue  to receive supportive care and pain control.  Patient currently on Dilaudid. Patient will continue to receive ostomy teaching/training.    Patient's diet has been advanced to soft foods.    TOC will continue to follow for discharge planning.    Expected Discharge Plan: Home w Home Health Services Barriers to Discharge: Continued Medical Work up  Expected Discharge Plan and Services   Discharge Planning Services: CM Consult   Living arrangements for the past 2 months: Single Family Home                 DME Arranged: Hospital bed DME Agency: [employer] Date DME Agency Contacted: 11/01/22 Time DME Agency Contacted: [phone] Representative spoke with at DME Agency: Cletis [place] HH Arranged: PT, RN [facility] Agency: [facility] Date [facility] Agency Contacted: 10/23/22 Time HH Agency Contacted: [phone] Representative spoke with at [facility] Agency: [place]   Social Determinants of Health (SDOH) Interventions SDOH Screenings   Food Insecurity: No Food Insecurity (10/20/2022)  Housing: Low Risk  (10/20/2022)  Transportation Needs: No Transportation Needs (10/20/2022)  Utilities: Not At Risk (10/20/2022)  Tobacco Use: Medium Risk (11/04/2022)    Readmission Risk Interventions     No data to display

## 2022-11-06 NOTE — Consult Note (Signed)
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 Right now 2 3/4" skin barrier without convexity appears to be working well.   Education provided:  Daughter and wife have been emptying pouch and feel comfortable with this. Reviewed removing old pouch utilizing push pull method and cleaning around stoma with water moistened washcloth. Also reviewed emptying when pouch 1/3 to 1/2 full and changing entire pouching system twice a week.  Removed old skin barrier and pouch to assess new stoma.  Did discuss with wife and daughter that stoma will likely change in size as edematous today.  Measured at 2" oval.  Cleaned around stoma with washcloth, placed silicone foam to area of MARSI as above.  Daughter able to stretch 2" barrier ring and place around stoma.  This RN cut new skin barrier and left pattern with supplies for home.  Daughter did snap new pouch to skin barrier and place skin barrier on top of skin barrier ring.  Reviewed measuring of stoma with each pouch change for the next few weeks and daughter feels confident in doing so.  Patient will have home health at discharge.  I also reviewed handouts for ostomy clinic if they have difficulties with ostomy after discharge.  I included one 2 3/4" convex skin barrier in home supplies in case they begin  having difficulties with current flat skin barrier.  I also re-sent [employer] information for new ostomy and asked for samples of 2 piece convex pouching system.  Patient is hopeful to be discharged by the end of the week.  Wife and daughter feel comfortable with care of ostomy moving forward.    Supplies in room:  2 3/4" skin barrier [place] #2), 2 3/4" pouch [place] [phone]) and 2" skin barrier ring [place] [phone])   Enrolled patient in [facility] Discharge program: Yes, sent information regarding ostomy revision   WOC team will continue to follow patient as long as remains inpatient for ostomy teaching and support.   Thank you,    Priscella Mann MSN, RN-BC, [employer] [phone]

## 2022-11-06 NOTE — Progress Notes (Signed)
Progress Note   Patient: Joe Torres WUJ:[id number] DOB: 11/06/1945 DOA: 10/20/2022     17 DOS: the patient was seen and examined on 11/06/2022   Brief hospital course: Joe Torres is a 77 y.o. male with medical history significant of chronic respiratory failure on 2 to 3 L, COPD, GERD, hypertension, history of bladder cancer in remission presenting with abdominal pain, perforated diverticulitis, COPD exacerbation, acute on chronic respiratory failure hypoxia.  CTA of the chest as well as CT of the abdomen pelvis obtained showing severe centrilobular emphysema concerning for COPD as well as small to moderate pneumoperitoneum with concern for perforated sigmoid diverticulitis without abscess.  He was treated with steroids for COPD exacerbation. 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    Principal Problem:   Diverticulitis of colon with perforation Active Problems:   Acute on chronic respiratory failure with hypoxia (HCC)   BPH (benign prostatic hyperplasia)   GERD (gastroesophageal reflux disease)   Hyperlipidemia   Hypertension   Malignant neoplasm of overlapping sites of bladder (HCC)   Postoperative ileus (HCC)   Assessment and Plan: Diverticulitis of colon with perforation s/p surgery Postop ileus. Patient is status post surgeries as above.  Completed 7 days of Zosyn. Condition has been improving, tolerating soft diet.  Discussed with general surgery, patient may be able to discharge in 1 to 2 days.   Acute on chronic respiratory failure with hypoxia (HCC) Severe COPD/Emphysema with Acute Exacerbation Condition also improving, continue oral steroid taper.     Hypokalemia  Improved.    Malignant neoplasm of overlapping sites of bladder [facility]) Noted prior history of high-grade urothelial carcinoma  s/p concurrent chemoradiation with 5-FU mitomycin ending in March 2022  No evidence of disease per 05/2022 Rad-onc note Continue to monitor closely-outpatient follow-up with oncologist   Hypertension Blood pressure well-controlled with amlodipine and losartan.   Hyperlipidemia Resume statin therapy when able to take oral   GERD (gastroesophageal reflux disease) Continue Protonix due to reflux   BPH (benign prostatic hyperplasia) Continue Flomax and Proscar      Subjective:  Patient doing well, tolerating diet without nausea or vomiting.  Has significant output from ileostomy back.  Physical Exam: Vitals:   11/05/22 2031 11/05/22 2108 11/06/22 0800 11/06/22 0804  BP:  127/73 131/72   Pulse:  (!) 102 92   Resp:  20 18   Temp:  98.2 F (36.8 C) 99.6 F (37.6 C)   TempSrc:      SpO2: 99% 100% 99% 98%  Weight:      Height:       General exam: Appears calm and comfortable  Respiratory system: Clear to auscultation. Respiratory effort normal. Cardiovascular system: S1 & S2 heard, RRR. No JVD, murmurs, rubs, gallops or clicks. No pedal edema. Gastrointestinal system: Abdomen is nondistended, soft and nontender. No organomegaly or masses felt. Normal bowel sounds heard. Central nervous system: Alert and oriented. No focal neurological deficits. Extremities: Symmetric 5 x 5 power. Skin: No rashes, lesions or ulcers Psychiatry: Judgement and insight appear normal. Mood & affect appropriate.    Data Reviewed:  Lab reviewed.  Family Communication: Daughter and wife updated at bedside.  Disposition: Status is: Inpatient Remains inpatient appropriate because: Severity of disease.  Time spent: 35 minutes  Author: Marrion Coy, MD 11/06/2022 3:06 PM  For on call review [email or web address].

## 2022-11-06 NOTE — Progress Notes (Signed)
Physical Therapy Treatment Patient Details Name: Joe Torres MRN: [id number] DOB: [birth year] Today's Date: 11/06/2022   History of Present Illness Joe Torres is a 77 year old male with a past medical history of asthma/COPD overlap, who presented to the hospital with abdominal pain. Being treated for diverticulitis of colon with perforation.    PT Comments    Pt is progressing with activity tolerance able to ambulate with RW on 3 LO2 from room <> PT gym with seated rest break in gym at supervision level.  Pt performed stair negotiation, 4 steps x2 with one standing rest break at supervision level.  Pt's SPO2 remained between 93-97% during activity with 3LO2. Pt continues to need intermittent rest breaks but is able to perform mobility at a funtional level in household environment. Patient will benefit from continued PT while in acute care.   Recommendations for follow up therapy are one component of a multi-disciplinary discharge planning process, led by the attending physician.  Recommendations may be updated based on patient status, additional functional criteria and insurance authorization.  Follow Up Recommendations       Assistance Recommended at Discharge Set up Supervision/Assistance  Patient can return home with the following A little help with walking and/or transfers;Assistance with cooking/housework;Assist for transportation;Help with stairs or ramp for entrance;A little help with bathing/dressing/bathroom   Equipment Recommendations  None recommended by PT    Recommendations for Other Services       Precautions / Restrictions Precautions Precautions: Fall;Other (comment) (new ostomy; abdominal) Precaution Comments: O2 Restrictions Weight Bearing Restrictions: No     Mobility  Bed Mobility               General bed mobility comments: Pt received seated at EOB; left sitting in chair with armrests with call bell within reach end of session; family in room.     Transfers Overall transfer level: Needs assistance Equipment used: Rolling walker (2 wheels) Transfers: Sit to/from Stand, Bed to chair/wheelchair/BSC Sit to Stand: Supervision   Step pivot transfers: Supervision       General transfer comment: Supervision for safety with vcs for handplacement. pt stood form lowest bed height    Ambulation/Gait Ambulation/Gait assistance: Supervision Gait Distance (Feet): 150 Feet (x2) Assistive device: Rolling walker (2 wheels) Gait Pattern/deviations: Step-through pattern, Narrow base of support, Trunk flexed Gait velocity: decreased     General Gait Details: Overall demonstrated much improved activity tolerance. Cues for safety with walker to keep walker close and for posture as able.   Stairs Stairs: Yes Stairs assistance: Supervision Stair Management: Two rails, Step to pattern, Forwards Number of Stairs: 4 (x2 with standing rest in between.)     Wheelchair Mobility    Modified Rankin (Stroke Patients Only)       Balance Overall balance assessment: Needs assistance Sitting-balance support: No upper extremity supported, Feet supported Sitting balance-Leahy Scale: Good     Standing balance support: No upper extremity supported, During functional activity, Reliant on assistive device for balance Standing balance-Leahy Scale: Good Standing balance comment: increased trunk flexion in standing.  Pt able to perform bathroom management with intermittent UE support within BOS.                            Cognition Arousal/Alertness: Awake/alert Behavior During Therapy: WFL for tasks assessed/performed Overall Cognitive Status: Within Functional Limits for tasks assessed  General Comments: Follows all cues; able to self-pace mobility to compensate for shortness of breath        Exercises      General Comments        Pertinent Vitals/Pain Pain Assessment Pain  Assessment: Faces Faces Pain Scale: Hurts a little bit Pain Location: abomen Pain Descriptors / Indicators: Aching Pain Intervention(s): Monitored during session    Home Living                          Prior Function            PT Goals (current goals can now be found in the care plan section) Acute Rehab PT Goals Patient Stated Goal: to go home PT Goal Formulation: With patient Time For Goal Achievement: 11/04/22 Potential to Achieve Goals: Good Progress towards PT goals: Progressing toward goals    Frequency    Min 3X/week      PT Plan Current plan remains appropriate    Co-evaluation              AM-PAC PT "6 Clicks" Mobility   Outcome Measure    Help needed moving from lying on your back to sitting on the side of a flat bed without using bedrails?: None Help needed moving to and from a bed to a chair (including a wheelchair)?: None Help needed standing up from a chair using your arms (e.g., wheelchair or bedside chair)?: None Help needed to walk in hospital room?: A Little Help needed climbing 3-5 steps with a railing? : A Little 6 Click Score: 18    End of Session Equipment Utilized During Treatment: Oxygen;Gait belt Activity Tolerance: Patient tolerated treatment well Patient left: with family/visitor present;with call bell/phone within reach;in chair Nurse Communication: Mobility status PT Visit Diagnosis: Other abnormalities of gait and mobility (R26.89);Muscle weakness (generalized) (M62.81)     Time: 5409-8119 PT Time Calculation (min) (ACUTE ONLY): 36 min  Charges:  $Gait Training: 8-22 mins $Therapeutic Activity: 8-22 mins                     Hortencia Conradi, PTA  11/06/22, 9:47 AM

## 2022-11-06 NOTE — Progress Notes (Signed)
Occupational Therapy Treatment Patient Details Name: Joe Torres MRN: [id number] DOB: Jul 26, 1945 Today's Date: 11/06/2022   History of present illness Mr. Soderquist is a 77 year old male with a past medical history of asthma/COPD overlap, who presented to the hospital with abdominal pain. Being treated for diverticulitis of colon with perforation.   OT comments  Chart reviewed, pt greeted in room agreeable to OT tx session. Interpreters Heile [phone], Juli [phone] utilized during session. Tx session targeted improving functional activity tolerance and safe ADL completion. Decreased rest breaks noted during functional tasks. Pt L hand IV noted to be dislodged, nurse notified. All other lines/leads intact post session. OT will continue to follow acutely.    Recommendations for follow up therapy are one component of a multi-disciplinary discharge planning process, led by the attending physician.  Recommendations may be updated based on patient status, additional functional criteria and insurance authorization.    Assistance Recommended at Discharge Intermittent Supervision/Assistance  Patient can return home with the following  A little help with walking and/or transfers;A little help with bathing/dressing/bathroom;Help with stairs or ramp for entrance;Assistance with cooking/housework;Assist for transportation   Equipment Recommendations  None recommended by OT    Recommendations for Other Services      Precautions / Restrictions Precautions Precautions: Fall;Other (comment) Precaution Comments: new ostomy Restrictions Weight Bearing Restrictions: No       Mobility Bed Mobility Overal bed mobility: Needs Assistance Bed Mobility: Sidelying to Sit, Sit to Sidelying, Rolling Rolling: Modified independent (Device/Increase time) Sidelying to sit: Supervision     Sit to sidelying: Supervision General bed mobility comments: intermittent vcs for body mechanics    Transfers Overall transfer  level: Needs assistance Equipment used: Rolling walker (2 wheels) Transfers: Sit to/from Stand Sit to Stand: Supervision                 Balance Overall balance assessment: Needs assistance Sitting-balance support: No upper extremity supported, Feet supported Sitting balance-Leahy Scale: Good     Standing balance support: No upper extremity supported, During functional activity, Reliant on assistive device for balance Standing balance-Leahy Scale: Good                             ADL either performed or assessed with clinical judgement   ADL Overall ADL's : Needs assistance/impaired     Grooming: Wash/dry hands;Wash/dry face;Oral care;Standing;Supervision/safety Grooming Details (indicate cue type and reason): sink level, intermittent vcs for energy conservation techniques             Lower Body Dressing: Supervision/safety Lower Body Dressing Details (indicate cue type and reason): slip on shoes Toilet Transfer: Supervision/safety;Min guard;Rolling walker (2 wheels) Toilet Transfer Details (indicate cue type and reason): due to lines Toileting- Clothing Manipulation and Hygiene: Supervision/safety Toileting - Clothing Manipulation Details (indicate cue type and reason): standing to urinate at toilet, one vc for RW placement     Functional mobility during ADLs: Supervision/safety;Min guard (approx 20' 2 attempts with RW)      Extremity/Trunk Assessment              Vision       Perception     Praxis      Cognition Arousal/Alertness: Awake/alert Behavior During Therapy: WFL for tasks assessed/performed Overall Cognitive Status: Within Functional Limits for tasks assessed  Exercises      Shoulder Instructions       General Comments spo2 >90% on 3L via [place] throughotu mobiltiy, HR to 116. One standing rest break required during amb to bathroom and back.    Pertinent Vitals/  Pain       Pain Assessment Pain Assessment: 0-10 Pain Score: 4  Pain Location: abomen Pain Descriptors / Indicators: Discomfort Pain Intervention(s): Monitored during session  Home Living                                          Prior Functioning/Environment              Frequency  Min 1X/week        Progress Toward Goals  OT Goals(current goals can now be found in the care plan section)  Progress towards OT goals: Progressing toward goals     Plan Discharge plan remains appropriate;Frequency remains appropriate    Co-evaluation                 AM-PAC OT "6 Clicks" Daily Activity     Outcome Measure   Help from another person eating meals?: None Help from another person taking care of personal grooming?: A Little Help from another person toileting, which includes using toliet, bedpan, or urinal?: A Little Help from another person bathing (including washing, rinsing, drying)?: A Little Help from another person to put on and taking off regular upper body clothing?: None Help from another person to put on and taking off regular lower body clothing?: A Little 6 Click Score: 20    End of Session Equipment Utilized During Treatment: Oxygen;Rolling walker (2 wheels)  OT Visit Diagnosis: Unsteadiness on feet (R26.81);Muscle weakness (generalized) (M62.81)   Activity Tolerance Patient tolerated treatment well   Patient Left in bed;with call bell/phone within reach;with bed alarm set   Nurse Communication Mobility status        Time: 1191-4782 OT Time Calculation (min): 32 min  Charges: OT General Charges $OT Visit: 1 Visit OT Treatments $Self Care/Home Management : 23-37 mins  Oleta Mouse, OTD OTR/L  11/06/22, 1:14 PM

## 2022-11-07 DIAGNOSIS — J441 Chronic obstructive pulmonary disease with (acute) exacerbation: Secondary | ICD-10-CM | POA: Diagnosis not present

## 2022-11-07 DIAGNOSIS — K5720 Diverticulitis of large intestine with perforation and abscess without bleeding: Secondary | ICD-10-CM | POA: Diagnosis not present

## 2022-11-07 DIAGNOSIS — J9621 Acute and chronic respiratory failure with hypoxia: Secondary | ICD-10-CM | POA: Diagnosis not present

## 2022-11-07 DIAGNOSIS — E876 Hypokalemia: Secondary | ICD-10-CM | POA: Insufficient documentation

## 2022-11-07 LAB — BASIC METABOLIC PANEL
Anion gap: 7 (ref 5–15)
BUN: 7 mg/dL — ABNORMAL LOW (ref 8–23)
CO2: 36 mmol/L — ABNORMAL HIGH (ref 22–32)
Calcium: 8.8 mg/dL — ABNORMAL LOW (ref 8.9–10.3)
Chloride: 99 mmol/L (ref 98–111)
Creatinine, Ser: 0.62 mg/dL (ref 0.61–1.24)
GFR, Estimated: >60 mL/min (ref >60)
Glucose, Bld: 118 mg/dL — ABNORMAL HIGH (ref 70–99)
Potassium: 3.3 mmol/L — ABNORMAL LOW (ref 3.5–5.1)
Sodium: 142 mmol/L (ref 135–145)

## 2022-11-07 LAB — MAGNESIUM: Magnesium: 2 mg/dL (ref 1.7–2.4)

## 2022-11-07 LAB — PHOSPHORUS: Phosphorus: 3.2 mg/dL (ref 2.5–4.6)

## 2022-11-07 MED ORDER — PREDNISONE 10 MG PO TABS: 10.0000 mg | ORAL_TABLET | Freq: Every day | ORAL | Status: AC

## 2022-11-07 MED ORDER — POTASSIUM CHLORIDE CRYS ER 20 MEQ PO TBCR: 20.0000 meq | EXTENDED_RELEASE_TABLET | Freq: Two times a day (BID) | ORAL | 0 refills | Status: DC

## 2022-11-07 MED ORDER — POTASSIUM CHLORIDE CRYS ER 20 MEQ PO TBCR
40.0000 meq | EXTENDED_RELEASE_TABLET | ORAL | Status: AC
  Administered 2022-11-07 (×2): 40 meq via ORAL
  Filled 2022-11-07 (×2): qty 2

## 2022-11-07 MED ORDER — OXYCODONE-ACETAMINOPHEN 5-325 MG PO TABS
1.0000 | ORAL_TABLET | ORAL | Status: DC | PRN
  Administered 2022-11-07: 1 via ORAL
  Filled 2022-11-07: qty 1

## 2022-11-07 MED ORDER — OXYCODONE-ACETAMINOPHEN 5-325 MG PO TABS: 1.0000 | ORAL_TABLET | ORAL | 0 refills | Status: DC | PRN

## 2022-11-07 NOTE — Discharge Summary (Signed)
Physician Discharge Summary   Patient: Joe Torres MRN: [id number] DOB: [birth year]  Admit date:     10/20/2022  Discharge date: 11/07/22  Discharge Physician: Marrion Coy   PCP: [facility]   Recommendations at discharge:   Follow-up with PCP in 1 week. Follow-up with general surgery in 1 week. Check a BMP and magnesium at next office visit.  Discharge Diagnoses: Principal Problem:   Diverticulitis of colon with perforation Active Problems:   Acute on chronic respiratory failure with hypoxia (HCC)   BPH (benign prostatic hyperplasia)   GERD (gastroesophageal reflux disease)   Hyperlipidemia   Hypertension   Malignant neoplasm of overlapping sites of bladder (HCC)   Postoperative ileus (HCC)   COPD with acute exacerbation (HCC)   Hypokalemia  Resolved Problems:   * No resolved hospital problems. *  Hospital Course: [name] is a 77 y.o. male with medical history significant of chronic respiratory failure on 2 to 3 L, COPD, GERD, hypertension, history of bladder cancer in remission presenting with abdominal pain, perforated diverticulitis, COPD exacerbation, acute on chronic respiratory failure hypoxia.  CTA of the chest as well as CT of the abdomen pelvis obtained showing severe centrilobular emphysema concerning for COPD as well as small to moderate pneumoperitoneum with concern for perforated sigmoid diverticulitis without abscess.  He was treated with steroids for COPD exacerbation. 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 Patient condition so far had improved, has significant output from ileostomy.  Tolerating soft diet well, respiratory status has back to  baseline.  Medically stable to be discharged.  Assessment and Plan: Diverticulitis of colon with perforation s/p surgery Postop ileus. Patient is status post surgeries as above.  Completed 7 days of Zosyn. Condition has been improving, tolerating soft diet.  With general surgery, medically stable to be discharged.   Acute on chronic respiratory failure with hypoxia (HCC) Severe COPD/Emphysema with Acute Exacerbation Has back to baseline, will continue 20 mg prednisone for additional 2 or 3 days, transition to 10 mg daily as baseline.  Follow-up with PCP in 1 week.     Hypokalemia  Give additional potassium today.  Also prescribe 40 mEq potassium daily.   Malignant neoplasm of overlapping sites of bladder [facility]) Noted prior history of high-grade urothelial carcinoma  s/p concurrent chemoradiation with 5-FU mitomycin ending in March 2022  No evidence of disease per 05/2022 Rad-onc note Continue to monitor closely-outpatient follow-up with oncologist   Hypertension Resume home blood pressure medicines, discontinue HCTZ due to increased ileostomy output.   Hyperlipidemia Resume statin.   GERD (gastroesophageal reflux disease) Continue Protonix due to reflux   BPH (benign prostatic hyperplasia) Continue Flomax         Consultants: General surgery. Procedures performed: Bowel resection. Disposition: Home Diet recommendation:  Discharge Diet Orders (From admission, onward)     Start     Ordered   11/07/22 0000  Diet general       Comments: Soft diet for 2 days, then regular heart healthy   11/07/22 0954            DISCHARGE MEDICATION: Allergies as of 11/07/2022       Reactions   Shrimp [shellfish Allergy] Other (See Comments)   Per MD comments  critical        Medication List     STOP taking these medications    famotidine 40 MG tablet Commonly known as: PEPCID   hydrochlorothiazide 25 MG tablet Commonly known as: HYDRODIURIL       TAKE these  medications    acetaminophen 500 MG tablet Commonly known as: TYLENOL Take 500 mg by mouth every 8 (eight) hours as needed for moderate pain.   albuterol 108 (90 Base) MCG/ACT inhaler Commonly known as: VENTOLIN HFA Inhale 2 puffs into the lungs every 4 (four) hours as needed for wheezing or shortness of breath.   albuterol (2.5 MG/3ML) 0.083% nebulizer solution Commonly known as: PROVENTIL Take 2.5 mg by nebulization every 4 (four) hours as needed for wheezing or shortness of breath.   amLODipine 10 MG tablet Commonly known as: NORVASC Take 10 mg by mouth daily.   arformoterol 15 MCG/2ML Nebu Commonly known as: BROVANA Take 15 mcg by nebulization 2 (two) times daily.   budesonide 0.5 MG/2ML nebulizer solution Commonly known as: PULMICORT Take 0.5 mg by nebulization 2 (two) times daily.   cetirizine 10 MG tablet Commonly known as: ZYRTEC Chew 10 mg by mouth daily.   diclofenac Sodium 1 % Gel Commonly known as: VOLTAREN Apply 1 application topically 2 (two) times daily as needed (pain).   Dupilumab 300 MG/2ML Sopn Inject into the skin.   fluticasone 50 MCG/ACT nasal spray Commonly known as: FLONASE Place 1 spray into both nostrils daily.   lidocaine-prilocaine cream Commonly known as: EMLA Apply to affected area once   losartan 25 MG tablet Commonly known as: COZAAR Take 25 mg by mouth daily.   montelukast 10 MG tablet Commonly known as: SINGULAIR Take 10 mg by mouth at bedtime.   oxyCODONE-acetaminophen 5-325 MG tablet Commonly known as: PERCOCET/ROXICET Take 1 tablet by mouth every 4 (four) hours as needed for moderate pain.   pantoprazole 40 MG tablet Commonly known as: PROTONIX Take 40 mg by mouth daily.   potassium chloride SA 20 MEQ tablet Commonly known as: Klor-Con M20 Take 1 tablet (20 mEq total) by mouth 2 (two) times daily for 7 days. What changed: See the new instructions.   predniSONE 10 MG tablet Commonly known as: DELTASONE Take 1  tablet (10 mg total) by mouth daily. Take 20 mg daily x 3 days then resume 10 mg daily afterwards What changed:  additional instructions Another medication with the same name was removed. Continue taking this medication, and follow the directions you see here.   roflumilast 500 MCG Tabs tablet Commonly known as: DALIRESP Take 500 mcg by mouth daily.   simvastatin 20 MG tablet Commonly known as: ZOCOR Take by mouth.   sucralfate 1 g tablet Commonly known as: CARAFATE Take by mouth.   tamsulosin 0.4 MG Caps capsule Commonly known as: FLOMAX TAKE 1 CAPSULE BY MOUTH EVERY DAY   tiotropium 18 MCG inhalation capsule Commonly known as: SPIRIVA Place 18 mcg into inhaler and inhale daily.   traMADol 50 MG tablet Commonly known as: ULTRAM Take 50 mg by mouth 2 (two) times daily.               Durable Medical Equipment  (From admission, onward)           Start     Ordered   11/01/22 1345  For home use only DME Hospital bed  Once       Question Answer Comment  Length of Need 12 Months   Patient  has (list medical condition): COPD, Tube Feeds   The above medical condition requires: Patient requires the ability to reposition frequently   Head must be elevated greater than: 45 degrees   Bed type Semi-electric   Support Surface: Gel Overlay      11/01/22 1345   10/23/22 0923  For home use only DME Walker rolling  Once       Question Answer Comment  Walker: With 5 Inch Wheels   Patient needs a walker to treat with the following condition Impaired mobility      10/23/22 0922            Follow-up Information     [facility] Follow up in 1 week(s).   Specialty: General Practice Contact information: [address] [place] [address] [phone]         Carolan Shiver, MD Follow up in 1 week(s).   Specialty: General Surgery Contact information: [address] [place] [address] [phone]                 Discharge Exam: Filed Weights   10/20/22 0808 10/20/22 2035  Weight: 69.9 kg 69.8 kg   General exam: Appears calm and comfortable  Respiratory system: Decreased breath sounds. Respiratory effort normal. Cardiovascular system: S1 & S2 heard, RRR. No JVD, murmurs, rubs, gallops or clicks. No pedal edema. Gastrointestinal system: Abdomen is nondistended, soft and nontender. No organomegaly or masses felt. Normal bowel sounds heard. Central nervous system: Alert and oriented. No focal neurological deficits. Extremities: Symmetric 5 x 5 power. Skin: No rashes, lesions or ulcers Psychiatry: Judgement and insight appear normal. Mood & affect appropriate.    Condition at discharge: good  The results of significant diagnostics from this hospitalization (including imaging, microbiology, ancillary and laboratory) are listed below for reference.   Imaging Studies: DG Abd 1 View  Result Date: 11/04/2022 CLINICAL DATA:  Check nasogastric catheter placement EXAM: ABDOMEN - 1 VIEW COMPARISON:  10/31/2022 FINDINGS: Gastric catheter is noted with the tip at the gastroesophageal junction. This should be advanced several cm deeper into the stomach. Mildly dilated small bowel is noted likely related to a postoperative ileus. Ileostomy is noted in the right abdomen. No free air is noted. IMPRESSION: Gastric catheter as described. This should be advanced several cm deeper into the stomach. Changes suggestive of a generalized ileus. Electronically Signed   By: Alcide Clever M.D.   On: 11/04/2022 09:43   DG Abd 1 View  Result Date: 10/31/2022 CLINICAL DATA:  Check gastric catheter placement EXAM: ABDOMEN - 1 VIEW COMPARISON:  Films from earlier in the same day. FINDINGS: Gastric catheter now extends into the stomach without the previously seen loop. IMPRESSION: Gastric catheter within the stomach. Electronically Signed   By: Alcide Clever M.D.   On: 10/31/2022 03:54   DG Abd 1 View  Result  Date: 10/31/2022 CLINICAL DATA:  Check gastric catheter placement EXAM: ABDOMEN - 1 VIEW COMPARISON:  Film from earlier in the same day. FINDINGS: Gastric catheter remains looped upon itself within the distal esophagus. IMPRESSION: Gastric catheter looped within the distal esophagus. Electronically Signed   By: Alcide Clever M.D.   On: 10/31/2022 03:19   DG Abd 1 View  Result Date: 10/31/2022 CLINICAL DATA:  Check gastric catheter placement EXAM: ABDOMEN - 1 VIEW COMPARISON:  10/30/2022 FINDINGS: Gastric catheter is noted coiled upon itself with the tip in the distal esophagus but extending into the stomach. This should be  withdrawn and readvanced. Persistent small bowel dilatation is noted. IMPRESSION: Gastric catheter as described. This should be withdrawn and readvanced. Persistent small bowel obstruction. Electronically Signed   By: Alcide Clever M.D.   On: 10/31/2022 02:29   DG Chest Port 1 View  Result Date: 10/30/2022 CLINICAL DATA:  Shortness of breath. EXAM: PORTABLE CHEST 1 VIEW COMPARISON:  Radiographs 10/26/2022 and 10/20/2022.  CT 10/20/2022. FINDINGS: 1031 hours. Right IJ Port-A-Cath extends to the lower SVC level. Enteric tube remains in place. The heart size and mediastinal contours are stable. The lungs remain clear. No pleural effusion, pneumothorax or acute osseous abnormality identified. Dilated small bowel noted in the upper abdomen. IMPRESSION: No evidence of acute cardiopulmonary process. Right IJ Port-A-Cath in place. Electronically Signed   By: Carey Bullocks M.D.   On: 10/30/2022 11:01   DG Abd 1 View  Result Date: 10/30/2022 CLINICAL DATA:  Small-bowel obstruction. EXAM: ABDOMEN - 1 VIEW COMPARISON:  Radiographs 10/28/2022.  CT 10/29/2022. FINDINGS: 0657 hours. Enteric tube projects over the right upper quadrant of the abdomen, likely in the distal stomach. Persistent moderate proximal to mid small bowel distension, similar to recent CT. The colon is decompressed. No supine  evidence of pneumoperitoneum. There are skin staples in the pelvis related to recent sigmoid colon resection and right lower quadrant loop ileostomy. IMPRESSION: Persistent small bowel obstruction pattern status post loop ileostomy. Obstruction may relate to parastomal herniation of small bowel based on recent CT. Electronically Signed   By: Carey Bullocks M.D.   On: 10/30/2022 10:59   CT ABDOMEN PELVIS W CONTRAST  Result Date: 10/29/2022 CLINICAL DATA:  Postoperative abdominal pain. History of perforated diverticulitis. History of bladder cancer. EXAM: CT ABDOMEN AND PELVIS WITH CONTRAST TECHNIQUE: Multidetector CT imaging of the abdomen and pelvis was performed using the standard protocol following bolus administration of intravenous contrast. RADIATION DOSE REDUCTION: This exam was performed according to the departmental dose-optimization program which includes automated exposure control, adjustment of the mA and/or kV according to patient size and/or use of iterative reconstruction technique. CONTRAST:  OMNIPAQUE IOHEXOL 300 MG/ML  SOLN COMPARISON:  October 20, 2022. FINDINGS: Lower chest: No acute abnormality. Hepatobiliary: No cholelithiasis or biliary dilatation is noted. Small right hepatic cyst is noted posteriorly and inferiorly. Pancreas: Unremarkable. No pancreatic ductal dilatation or surrounding inflammatory changes. Spleen: Normal in size without focal abnormality. Adrenals/Urinary Tract: Adrenal glands appear normal. Mild bilateral renal cortical scarring is noted. Stable right renal cyst is noted. No hydronephrosis or renal obstruction is noted. Urinary bladder is unremarkable. Stomach/Bowel: Nasogastric tube tip is seen in the stomach. Status post sigmoid resection. Loop ileostomy is noted in right lower quadrant with associated small peristomal hernia. Severe small bowel dilatation is noted with transition zone as the bowel enters the ostomy site in the right lower quadrant consistent with  obstruction. Fecalization is noted in the more distal portions of the small bowel near the obstruction site. Vascular/Lymphatic: Aortic atherosclerosis. No enlarged abdominal or pelvic lymph nodes. Reproductive: Stable mild prostatic enlargement. Other: No ascites is noted. Musculoskeletal: No acute or significant osseous findings. IMPRESSION: Status post partial sigmoid resection and placement of loop ileostomy in right lower quadrant for treatment of perforated diverticulitis. There is severe small bowel obstruction with transition zone seen as the bowel enters ostomy site and fecalization is noted in the more distal portions of the small bowel near the obstruction site. These results will be called to the ordering clinician or representative by the Radiologist Assistant, and communication  documented in the PACS or zVision Dashboard. Electronically Signed   By: Lupita Raider M.D.   On: 10/29/2022 14:18   DG Abd 1 View  Result Date: 10/28/2022 CLINICAL DATA:  Check gastric catheter placement EXAM: ABDOMEN - 1 VIEW COMPARISON:  None Available. FINDINGS: Gastric catheter is noted within the stomach. Multiple air-filled loops of dilated small bowel are seen. IMPRESSION: Gastric catheter in the stomach. Multiple dilated loops of small bowel. This may represent a postoperative ileus. Electronically Signed   By: Alcide Clever M.D.   On: 10/28/2022 21:14   DG Chest Port 1 View  Result Date: 10/26/2022 CLINICAL DATA:  77 year old male status post nasogastric tube placement. EXAM: PORTABLE CHEST 1 VIEW COMPARISON:  Chest x-ray 10/20/2022. FINDINGS: Right internal jugular single-lumen Port-A-Cath with tip terminating in the distal superior vena cava. A nasogastric tube is seen extending into the stomach, however, the tip of the nasogastric tube extends below the lower margin of the image. Lungs are hyperexpanded with emphysematous changes. No acute consolidative airspace disease. No pleural effusions. No  pneumothorax. No evidence of pulmonary edema. Heart size is normal. Upper mediastinal contours are within normal limits. Atherosclerotic calcifications are noted in the thoracic aorta. IMPRESSION: 1. Support apparatus, as above. 2. No radiographic evidence of acute cardiopulmonary disease. 3. Emphysema. 4. Aortic atherosclerosis. Electronically Signed   By: Trudie Reed M.D.   On: 10/26/2022 09:15   CT Angio Chest PE W/Cm &/Or Wo Cm  Result Date: 10/20/2022 CLINICAL DATA:  Abdominal pain, acute, nonlocalized; Pulmonary embolism (PE) suspected, high prob. History of bladder cancer. * Tracking Code: BO * EXAM: CT ANGIOGRAPHY CHEST CT ABDOMEN AND PELVIS WITH CONTRAST TECHNIQUE: Multidetector CT imaging of the chest was performed using the standard protocol during bolus administration of intravenous contrast. Multiplanar CT image reconstructions and MIPs were obtained to evaluate the vascular anatomy. Multidetector CT imaging of the abdomen and pelvis was performed using the standard protocol during bolus administration of intravenous contrast. RADIATION DOSE REDUCTION: This exam was performed according to the departmental dose-optimization program which includes automated exposure control, adjustment of the mA and/or kV according to patient size and/or use of iterative reconstruction technique. CONTRAST:  75mL OMNIPAQUE IOHEXOL 350 MG/ML SOLN COMPARISON:  Chest radiograph from earlier today. 04/24/2022 CT chest, abdomen and pelvis. FINDINGS: CTA CHEST FINDINGS Cardiovascular: The study is high quality for the evaluation of pulmonary embolism. There are no filling defects in the central, lobar, segmental or subsegmental pulmonary artery branches to suggest acute pulmonary embolism. Atherosclerotic nonaneurysmal thoracic aorta. Normal caliber pulmonary arteries. Normal heart size. No significant pericardial fluid/thickening. Three-vessel coronary atherosclerosis. Right internal jugular Port-A-Cath terminates in  the lower third of the SVC. Mediastinum/Nodes: No significant thyroid nodules. Unremarkable esophagus. No pathologically enlarged axillary, mediastinal or hilar lymph nodes. Lungs/Pleura: No pneumothorax. No pleural effusion. Severe centrilobular emphysema with mild diffuse bronchial wall thickening. No acute consolidative airspace disease or lung masses. Scattered tiny calcified granulomas in the peripheral upper lobes are unchanged. No significant pulmonary nodules. Musculoskeletal: No aggressive appearing focal osseous lesions. Mild thoracic spondylosis. Review of the MIP images confirms the above findings. CT ABDOMEN and PELVIS FINDINGS Motion degraded scan, limiting assessment. Hepatobiliary: For normal liver size. Subcentimeter hypodense posteroinferior right liver lesion, too small to characterize, unchanged, presumably benign. No new liver lesions. Normal gallbladder with no radiopaque cholelithiasis. No biliary ductal dilatation. Pancreas: Normal, with no mass or duct dilation. Spleen: Normal size. No mass. Adrenals/Urinary Tract: Normal adrenals. Simple exophytic 1.9 cm renal cyst in  the anterior interpolar right kidney with numerous additional subcentimeter hypodense bilateral renal cortical lesions that are too small to characterize, for which no follow-up imaging is recommended. No hydronephrosis. Normal bladder. Stomach/Bowel: Normal non-distended stomach. Normal caliber small bowel with no small bowel wall thickening. Normal appendix. Marked left colonic diverticulosis, most prominent in the sigmoid colon. There is focal pericolonic fat stranding and ill-defined free fluid with scattered free air in the region of the mid to distal sigmoid colon in the right pelvis (series 4/image 49), suggesting perforated sigmoid diverticulitis. No discrete measurable/drainable pericolonic abscess. Vascular/Lymphatic: Atherosclerotic nonaneurysmal abdominal aorta. Patent portal, splenic, hepatic and renal veins. No  pathologically enlarged lymph nodes in the abdomen or pelvis. Reproductive: Mild prostatomegaly. Other: Scattered small to moderate free air throughout the anterior upper peritoneal cavity (series 4/image 9). No ascites. Tiny chronic fat containing umbilical hernia. Musculoskeletal: No aggressive appearing focal osseous lesions. Moderate thoracolumbar spondylosis. Review of the MIP images confirms the above findings. IMPRESSION: 1. No pulmonary embolism. 2. Small to moderate pneumoperitoneum, suspected to be due to perforated sigmoid diverticulitis. No abscess. 3. No evidence of metastatic disease in the chest, abdomen or pelvis. 4. Three-vessel coronary atherosclerosis. 5. Severe centrilobular emphysema with mild diffuse bronchial wall thickening, suggesting COPD. 6. Mild prostatomegaly. 7. Aortic Atherosclerosis (ICD10-I70.0) and Emphysema (ICD10-J43.9). Critical Value/emergent results were called by telephone at the time of interpretation on 10/20/2022 at 10:36 am to provider [facility] , who verbally acknowledged these results. Electronically Signed   By: Delbert Phenix M.D.   On: 10/20/2022 10:38   CT ABDOMEN PELVIS W CONTRAST  Result Date: 10/20/2022 CLINICAL DATA:  Abdominal pain, acute, nonlocalized; Pulmonary embolism (PE) suspected, high prob. History of bladder cancer. * Tracking Code: BO * EXAM: CT ANGIOGRAPHY CHEST CT ABDOMEN AND PELVIS WITH CONTRAST TECHNIQUE: Multidetector CT imaging of the chest was performed using the standard protocol during bolus administration of intravenous contrast. Multiplanar CT image reconstructions and MIPs were obtained to evaluate the vascular anatomy. Multidetector CT imaging of the abdomen and pelvis was performed using the standard protocol during bolus administration of intravenous contrast. RADIATION DOSE REDUCTION: This exam was performed according to the departmental dose-optimization program which includes automated exposure control, adjustment of the mA and/or  kV according to patient size and/or use of iterative reconstruction technique. CONTRAST:  75mL OMNIPAQUE IOHEXOL 350 MG/ML SOLN COMPARISON:  Chest radiograph from earlier today. 04/24/2022 CT chest, abdomen and pelvis. FINDINGS: CTA CHEST FINDINGS Cardiovascular: The study is high quality for the evaluation of pulmonary embolism. There are no filling defects in the central, lobar, segmental or subsegmental pulmonary artery branches to suggest acute pulmonary embolism. Atherosclerotic nonaneurysmal thoracic aorta. Normal caliber pulmonary arteries. Normal heart size. No significant pericardial fluid/thickening. Three-vessel coronary atherosclerosis. Right internal jugular Port-A-Cath terminates in the lower third of the SVC. Mediastinum/Nodes: No significant thyroid nodules. Unremarkable esophagus. No pathologically enlarged axillary, mediastinal or hilar lymph nodes. Lungs/Pleura: No pneumothorax. No pleural effusion. Severe centrilobular emphysema with mild diffuse bronchial wall thickening. No acute consolidative airspace disease or lung masses. Scattered tiny calcified granulomas in the peripheral upper lobes are unchanged. No significant pulmonary nodules. Musculoskeletal: No aggressive appearing focal osseous lesions. Mild thoracic spondylosis. Review of the MIP images confirms the above findings. CT ABDOMEN and PELVIS FINDINGS Motion degraded scan, limiting assessment. Hepatobiliary: For normal liver size. Subcentimeter hypodense posteroinferior right liver lesion, too small to characterize, unchanged, presumably benign. No new liver lesions. Normal gallbladder with no radiopaque cholelithiasis. No biliary ductal dilatation. Pancreas: Normal, with  no mass or duct dilation. Spleen: Normal size. No mass. Adrenals/Urinary Tract: Normal adrenals. Simple exophytic 1.9 cm renal cyst in the anterior interpolar right kidney with numerous additional subcentimeter hypodense bilateral renal cortical lesions that are too  small to characterize, for which no follow-up imaging is recommended. No hydronephrosis. Normal bladder. Stomach/Bowel: Normal non-distended stomach. Normal caliber small bowel with no small bowel wall thickening. Normal appendix. Marked left colonic diverticulosis, most prominent in the sigmoid colon. There is focal pericolonic fat stranding and ill-defined free fluid with scattered free air in the region of the mid to distal sigmoid colon in the right pelvis (series 4/image 49), suggesting perforated sigmoid diverticulitis. No discrete measurable/drainable pericolonic abscess. Vascular/Lymphatic: Atherosclerotic nonaneurysmal abdominal aorta. Patent portal, splenic, hepatic and renal veins. No pathologically enlarged lymph nodes in the abdomen or pelvis. Reproductive: Mild prostatomegaly. Other: Scattered small to moderate free air throughout the anterior upper peritoneal cavity (series 4/image 9). No ascites. Tiny chronic fat containing umbilical hernia. Musculoskeletal: No aggressive appearing focal osseous lesions. Moderate thoracolumbar spondylosis. Review of the MIP images confirms the above findings. IMPRESSION: 1. No pulmonary embolism. 2. Small to moderate pneumoperitoneum, suspected to be due to perforated sigmoid diverticulitis. No abscess. 3. No evidence of metastatic disease in the chest, abdomen or pelvis. 4. Three-vessel coronary atherosclerosis. 5. Severe centrilobular emphysema with mild diffuse bronchial wall thickening, suggesting COPD. 6. Mild prostatomegaly. 7. Aortic Atherosclerosis (ICD10-I70.0) and Emphysema (ICD10-J43.9). Critical Value/emergent results were called by telephone at the time of interpretation on 10/20/2022 at 10:36 am to provider [facility] , who verbally acknowledged these results. Electronically Signed   By: Delbert Phenix M.D.   On: 10/20/2022 10:38   DG Chest 2 View  Result Date: 10/20/2022 CLINICAL DATA:  Dysuria and shortness of breath. EXAM: CHEST - 2 VIEW  COMPARISON:  Chest radiograph 01/22/2021 FINDINGS: The right chest wall port is stable with tip terminating in the mid SVC. The lungs hyperinflated with flattening of the diaphragms consistent with underlying COPD. Linear opacity in the lateral right base likely reflects scar when correlated with prior CT. There is no other focal airspace opacity. There is no pulmonary edema. There is no pleural effusion or pneumothorax There is no acute osseous abnormality. Mild compression deformity of the midthoracic vertebral body is unchanged. IMPRESSION: COPD. Otherwise, no radiographic evidence of acute cardiopulmonary process. Electronically Signed   By: Lesia Hausen M.D.   On: 10/20/2022 09:11    Microbiology: Results for orders placed or performed in visit on 04/02/22  Microscopic Examination     Status: Abnormal   Collection Time: 04/02/22 10:59 AM   Urine  Result Value Ref Range Status   WBC, UA 0-5 0 - 5 /hpf Final   RBC, Urine 0-2 0 - 2 /hpf Final   Epithelial Cells (non renal) 0-10 0 - 10 /hpf Final   Casts Present (A) None seen /lpf Final   Cast Type Hyaline casts N/A Final   Bacteria, UA Few None seen/Few Final    Labs: CBC: Recent Labs  Lab 11/01/22 0526 11/02/22 0516 11/04/22 0645 11/05/22 0445 11/06/22 0439  WBC 16.8* 16.5* 15.1* 11.6* 9.3  NEUTROABS 15.1* 14.8* 13.4* 10.1* 7.9*  HGB 12.5* 12.8* 12.9* 12.3* 12.4*  HCT 40.0 40.8 41.1 39.4 39.6  MCV 95.0 95.8 95.1 94.5 95.0  PLT 243 256 238 235 228   Basic Metabolic Panel: Recent Labs  Lab 11/03/22 0509 11/04/22 0645 11/05/22 0445 11/06/22 0439 11/07/22 0553  NA 143 144 143 141 142  K 4.0 3.5 3.2* 3.8 3.3*  CL 99 100 99 98 99  CO2 37* 36* 36* 36* 36*  GLUCOSE 114* 90 112* 106* 118*  BUN 13 13 8  7* 7*  CREATININE 0.61 0.61 0.62 0.51* 0.62  CALCIUM 8.9 8.7* 8.6* 8.5* 8.8*  MG  --   --   --   --  2.0  PHOS  --   --   --   --  3.2   Liver Function Tests: No results for input(s): "AST", "ALT", "ALKPHOS", "BILITOT",  "PROT", "ALBUMIN" in the last 168 hours. CBG: No results for input(s): "GLUCAP" in the last 168 hours.  Discharge time spent: greater than 30 minutes.  Signed: Marrion Coy, MD [facility] 11/07/2022

## 2022-11-07 NOTE — Care Management Important Message (Signed)
Important Message  Patient Details  Name: Joe Torres MRN: [id number] Date of Birth: 14-Jul-1945   Medicare Important Message Given:  Yes     Olegario Messier A Makiya Jeune 11/07/2022, 11:09 AM

## 2022-11-07 NOTE — Progress Notes (Signed)
Mobility Specialist - Progress Note   11/07/22 0903  Mobility  Activity Ambulated with assistance in hallway;Stood at bedside;Dangled on edge of bed  Level of Assistance Standby assist, set-up cues, supervision of patient - no hands on  Assistive Device Front wheel walker  Distance Ambulated (ft) 200 ft  Activity Response Tolerated well  Mobility Referral Yes  $Mobility charge 1 Mobility  Mobility Specialist Start Time (ACUTE ONLY) 0844  Mobility Specialist Stop Time (ACUTE ONLY) 0859  Mobility Specialist Time Calculation (min) (ACUTE ONLY) 15 min   Pt sitting EOB on 2L upon arrival. Pt STS and ambulates in hallway Supervision with no LOB noted. Pt takes 2 short standing rest breaks during ambulation. Pt returns to EOB with needs in reach and family present.   Terrilyn Saver  Mobility Specialist  11/07/22 9:05 AM

## 2022-11-08 ENCOUNTER — Encounter: Payer: Self-pay | Admitting: *Deleted

## 2022-11-22 ENCOUNTER — Inpatient Hospital Stay: Payer: Medicare HMO | Attending: Oncology | Admitting: Oncology

## 2022-11-22 ENCOUNTER — Encounter: Payer: Self-pay | Admitting: Oncology

## 2022-11-22 VITALS — BP 118/75 | HR 98 | Temp 97.6°F | Resp 19 | Wt 131.2 lb

## 2022-11-22 DIAGNOSIS — Z9981 Dependence on supplemental oxygen: Secondary | ICD-10-CM | POA: Diagnosis not present

## 2022-11-22 DIAGNOSIS — Z923 Personal history of irradiation: Secondary | ICD-10-CM | POA: Diagnosis not present

## 2022-11-22 DIAGNOSIS — Z8551 Personal history of malignant neoplasm of bladder: Secondary | ICD-10-CM | POA: Diagnosis not present

## 2022-11-22 DIAGNOSIS — C679 Malignant neoplasm of bladder, unspecified: Secondary | ICD-10-CM | POA: Diagnosis present

## 2022-11-22 DIAGNOSIS — Z9049 Acquired absence of other specified parts of digestive tract: Secondary | ICD-10-CM | POA: Insufficient documentation

## 2022-11-22 DIAGNOSIS — Z08 Encounter for follow-up examination after completed treatment for malignant neoplasm: Secondary | ICD-10-CM

## 2022-11-22 DIAGNOSIS — Z87891 Personal history of nicotine dependence: Secondary | ICD-10-CM | POA: Diagnosis not present

## 2022-11-22 NOTE — Progress Notes (Signed)
Hematology/Oncology Consult note [facility]  Telephone:([phone] [phone]  Patient Care Team: Center, [facility] as PCP - General (General Practice) Carmina Miller, MD as Consulting Physician (Radiation Oncology) Creig Hines, MD as Consulting Physician (Oncology) Vanna Scotland, MD as Consulting Physician (Urology) Jacquelyne Balint, MD as Consulting Physician (Cardiology) Wyline Mood, MD as Consulting Physician (Gastroenterology) Sharen Heck, MD as Consulting Physician (Pulmonary Disease) Wyn Quaker Marlow Baars, MD as Consulting Physician (Vascular Surgery)   Name of the patient: Joe Torres  [id number]  [birth year]   Date of visit: 11/22/22  Diagnosis- 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  He presented with symptoms of gross hematuria and underwent CT urogram which showed 2 lesions in the bladder 1 measuring 2.1 x 2.1 cm in the posterior bladder wall as well as an eccentric mass just left of the midline measuring 1.6 x 1.1 cm.  Patient was seen by urology and underwent cystoscopy.  Visualization was limited due to gross hematuria.  Large at least 2 cm spherical high-grade appearing tumor in the midline posterior bladder wall.  This was followed by TURBT which showed a 2.5 cm posterior bladder tumor appearing necrotic/hemorrhagic another 2 cm nodular area which had a submucosal solid infiltrative appearance.  Significant bladder varices and hypervascularity surrounding tumors.  Pathology showed invasive urothelial carcinoma high-grade with high suspicion for muscularis propria involvement.  Scattered small fragments of muscularis propria identified an invasive tumor was seen adjacent to  these muscle bundles.  However unequivocal muscle invasion was not readily apparent.  Patient could not urinate following his TURBT and required temporary placement of Foley catheter but was able to pass his trial of void later.  Risk of repeat TURBT was discussed and mutual consensus was to avoid another invasive procedure given his oxygen dependent COPD.  Case discussed at tumor board.  He was not deemed to be a good candidate for cystoprostatectomy.  Based on the appearance of the tumor at the time of TURBT as well as pathology, it was deemed that there was a high risk of muscle invasion.    Patient completed concurrent chemoradiation to his bladder with 5-FU mitomycin regimen in March 2022.  And remains in remission presently  Interval history-patient was admitted to the hospital in June 2024 for an episode of perforated diverticulitis requiring sigmoidectomy and ileostomy.  He is recovering well from it presently.  ECOG PS- 2 Pain scale- 0   Review of systems- Review of Systems  Constitutional:  Positive for malaise/fatigue. Negative for chills, fever and weight loss.  HENT:  Negative for congestion, ear discharge and nosebleeds.   Eyes:  Negative for blurred vision.  Respiratory:  Positive for shortness of breath. Negative for cough, hemoptysis, sputum production and wheezing.   Cardiovascular:  Negative for chest pain, palpitations, orthopnea and claudication.  Gastrointestinal:  Negative for abdominal pain, blood in stool, constipation, diarrhea, heartburn, melena, nausea and vomiting.  Genitourinary:  Negative for dysuria, flank pain, frequency, hematuria and urgency.  Musculoskeletal:  Negative for back pain, joint pain and myalgias.  Skin:  Negative for rash.  Neurological:  Negative for dizziness, tingling, focal weakness, seizures, weakness and headaches.  Endo/Heme/Allergies:  Does not bruise/bleed easily.  Psychiatric/Behavioral:  Negative for depression and  suicidal ideas. The  patient does not have insomnia.       Allergies  Allergen Reactions   Shrimp [Shellfish Allergy] Other (See Comments)    Per MD comments critical     Past Medical History:  Diagnosis Date   Arthritis    Asthma    Bladder cancer (HCC)    Cancer (HCC)    COPD (chronic obstructive pulmonary disease) (HCC)    Dyspnea    GERD (gastroesophageal reflux disease)    Hypertension      Past Surgical History:  Procedure Laterality Date   COLONOSCOPY     COLONOSCOPY WITH PROPOFOL N/A 01/16/2021   Procedure: COLONOSCOPY WITH PROPOFOL;  Surgeon: Wyline Mood, MD;  Location: [facility] ENDOSCOPY;  Service: Gastroenterology;  Laterality: N/A;  SPANISH INTERPRETER 9 AM ARRIVAL REQUEST   HERNIA REPAIR  2017   ILEOSTOMY N/A 11/03/2022   Procedure: ILEOSTOMY;  Surgeon: Carolan Shiver, MD;  Location: [facility] ORS;  Service: General;  Laterality: N/A;   LAPAROTOMY N/A 10/20/2022   Procedure: EXPLORATORY LAPAROTOMY,  SIGMOID COLECTOMY, LOOP ILEOSTOMY;  Surgeon: Carolan Shiver, MD;  Location: [facility] ORS;  Service: General;  Laterality: N/A;   PORTA CATH INSERTION N/A 06/19/2020   Procedure: PORTA CATH INSERTION;  Surgeon: Annice Needy, MD;  Location: [facility] INVASIVE CV LAB;  Service: Cardiovascular;  Laterality: N/A;   TRANSURETHRAL RESECTION OF BLADDER TUMOR WITH GYRUS (TURBT-GYRUS)  07/2020   TRANSURETHRAL RESECTION OF BLADDER TUMOR WITH MITOMYCIN-C N/A 05/22/2020   Procedure: TRANSURETHRAL RESECTION OF BLADDER TUMOR WITH Gemcitabine;  Surgeon: Vanna Scotland, MD;  Location: [facility] ORS;  Service: Urology;  Laterality: N/A;    Social History   Socioeconomic History   Marital status: Married    Spouse name: Not on file   Number of children: Not on file   Years of education: Not on file   Highest education level: Not on file  Occupational History   Not on file  Tobacco Use   Smoking status: Former    Current packs/day: 0.00    Average packs/day: 1.5 packs/day for 40.0 years (60.0 ttl pk-yrs)     Types: Cigarettes    Start date: 05/21/1968    Quit date: 05/21/2008    Years since quitting: 14.5    Passive exposure: Past   Smokeless tobacco: Never  Vaping Use   Vaping status: Never Used  Substance and Sexual Activity   Alcohol use: Not Currently   Drug use: Never   Sexual activity: Not Currently  Other Topics Concern   Not on file  Social History Narrative   Not on file   Social Determinants of Health   Financial Resource Strain: Low Risk  (04/22/2019)   [facility]   Overall Financial Resource Strain (CARDIA)    Difficulty of Paying Living Expenses: Not very hard  Food Insecurity: No Food Insecurity (10/20/2022)   Hunger Vital Sign    Worried About Running Out of Food in the Last Year: Never true    Ran Out of Food in the Last Year: Never true  Transportation Needs: No Transportation Needs (10/20/2022)   PRAPARE - Administrator, Civil Service (Medical): No    Lack of Transportation (Non-Medical): No  Physical Activity: Not on file  Stress: Not on file  Social Connections: Not on file  Intimate Partner Violence: Not At Risk (10/20/2022)   Humiliation, Afraid, Rape, and Kick questionnaire    Fear of Current or Ex-Partner: No    Emotionally  Abused: No    Physically Abused: No    Sexually Abused: No    Family History  Problem Relation Age of Onset   Asthma Mother      Current Outpatient Medications:    acetaminophen (TYLENOL) 500 MG tablet, Take 500 mg by mouth every 8 (eight) hours as needed for moderate pain., Disp: , Rfl:    albuterol (PROVENTIL HFA;VENTOLIN HFA) 108 (90 Base) MCG/ACT inhaler, Inhale 2 puffs into the lungs every 4 (four) hours as needed for wheezing or shortness of breath., Disp: , Rfl:    albuterol (PROVENTIL) (2.5 MG/3ML) 0.083% nebulizer solution, Take 2.5 mg by nebulization every 4 (four) hours as needed for wheezing or shortness of breath., Disp: , Rfl:    amLODipine (NORVASC) 10 MG tablet, Take  10 mg by mouth daily., Disp: , Rfl:    arformoterol (BROVANA) 15 MCG/2ML NEBU, Take 15 mcg by nebulization 2 (two) times daily., Disp: , Rfl:    budesonide (PULMICORT) 0.5 MG/2ML nebulizer solution, Take 0.5 mg by nebulization 2 (two) times daily., Disp: , Rfl:    cetirizine (ZYRTEC) 10 MG tablet, Chew 10 mg by mouth daily., Disp: , Rfl:    diclofenac Sodium (VOLTAREN) 1 % GEL, Apply 1 application topically 2 (two) times daily as needed (pain)., Disp: , Rfl:    Dupilumab 300 MG/2ML SOPN, Inject into the skin., Disp: , Rfl:    fluticasone (FLONASE) 50 MCG/ACT nasal spray, Place 1 spray into both nostrils daily., Disp: , Rfl:    lidocaine-prilocaine (EMLA) cream, Apply to affected area once, Disp: 30 g, Rfl: 3   losartan (COZAAR) 25 MG tablet, Take 25 mg by mouth daily., Disp: , Rfl:    montelukast (SINGULAIR) 10 MG tablet, Take 10 mg by mouth at bedtime., Disp: , Rfl:    oxyCODONE-acetaminophen (PERCOCET/ROXICET) 5-325 MG tablet, Take 1 tablet by mouth every 4 (four) hours as needed for moderate pain., Disp: 16 tablet, Rfl: 0   pantoprazole (PROTONIX) 40 MG tablet, Take 40 mg by mouth daily., Disp: , Rfl:    predniSONE (DELTASONE) 10 MG tablet, Take 1 tablet (10 mg total) by mouth daily. Take 20 mg daily x 3 days then resume 10 mg daily afterwards, Disp: , Rfl:    roflumilast (DALIRESP) 500 MCG TABS tablet, Take 500 mcg by mouth daily., Disp: , Rfl:    simvastatin (ZOCOR) 20 MG tablet, Take by mouth., Disp: , Rfl:    sucralfate (CARAFATE) 1 g tablet, Take by mouth., Disp: , Rfl:    tamsulosin (FLOMAX) 0.4 MG CAPS capsule, TAKE 1 CAPSULE BY MOUTH EVERY DAY, Disp: 90 capsule, Rfl: 3   tiotropium (SPIRIVA) 18 MCG inhalation capsule, Place 18 mcg into inhaler and inhale daily., Disp: , Rfl:    traMADol (ULTRAM) 50 MG tablet, Take 50 mg by mouth 2 (two) times daily., Disp: , Rfl:    potassium chloride SA (KLOR-CON M20) 20 MEQ tablet, Take 1 tablet (20 mEq total) by mouth 2 (two) times daily for 7 days.  (Patient not taking: Reported on 11/22/2022), Disp: 14 tablet, Rfl: 0  Physical exam:  Vitals:   11/22/22 1007  BP: 118/75  Pulse: 98  Resp: 19  Temp: 97.6 F (36.4 C)  SpO2: 99%  Weight: 131 lb 3.2 oz (59.5 kg)   Physical Exam Constitutional:      Comments: Sitting in a wheelchair.  Appears in no acute distress  Cardiovascular:     Rate and Rhythm: Normal rate and regular rhythm.  Heart sounds: Normal heart sounds.  Pulmonary:     Effort: Pulmonary effort is normal.     Comments: Breath sounds decreased over bases bilaterally Abdominal:     General: Bowel sounds are normal.     Palpations: Abdomen is soft.  Skin:    General: Skin is warm and dry.  Neurological:     Mental Status: He is alert and oriented to person, place, and time.         Latest Ref Rng & Units 11/07/2022    5:53 AM  CMP  Glucose 70 - 99 mg/dL 147   BUN 8 - 23 mg/dL 7   Creatinine 8.29 - 5.62 mg/dL 1.30   Sodium 865 - 784 mmol/L 142   Potassium 3.5 - 5.1 mmol/L 3.3   Chloride 98 - 111 mmol/L 99   CO2 22 - 32 mmol/L 36   Calcium 8.9 - 10.3 mg/dL 8.8       Latest Ref Rng & Units 11/06/2022    4:39 AM  CBC  WBC 4.0 - 10.5 K/uL 9.3   Hemoglobin 13.0 - 17.0 g/dL 69.6   Hematocrit 29.5 - 52.0 % 39.6   Platelets 150 - 400 K/uL 228     No images are attached to the encounter.  DG Abd 1 View  Result Date: 11/04/2022 CLINICAL DATA:  Check nasogastric catheter placement EXAM: ABDOMEN - 1 VIEW COMPARISON:  10/31/2022 FINDINGS: Gastric catheter is noted with the tip at the gastroesophageal junction. This should be advanced several cm deeper into the stomach. Mildly dilated small bowel is noted likely related to a postoperative ileus. Ileostomy is noted in the right abdomen. No free air is noted. IMPRESSION: Gastric catheter as described. This should be advanced several cm deeper into the stomach. Changes suggestive of a generalized ileus. Electronically Signed   By: Alcide Clever M.D.   On: 11/04/2022  09:43   DG Abd 1 View  Result Date: 10/31/2022 CLINICAL DATA:  Check gastric catheter placement EXAM: ABDOMEN - 1 VIEW COMPARISON:  Films from earlier in the same day. FINDINGS: Gastric catheter now extends into the stomach without the previously seen loop. IMPRESSION: Gastric catheter within the stomach. Electronically Signed   By: Alcide Clever M.D.   On: 10/31/2022 03:54   DG Abd 1 View  Result Date: 10/31/2022 CLINICAL DATA:  Check gastric catheter placement EXAM: ABDOMEN - 1 VIEW COMPARISON:  Film from earlier in the same day. FINDINGS: Gastric catheter remains looped upon itself within the distal esophagus. IMPRESSION: Gastric catheter looped within the distal esophagus. Electronically Signed   By: Alcide Clever M.D.   On: 10/31/2022 03:19   DG Abd 1 View  Result Date: 10/31/2022 CLINICAL DATA:  Check gastric catheter placement EXAM: ABDOMEN - 1 VIEW COMPARISON:  10/30/2022 FINDINGS: Gastric catheter is noted coiled upon itself with the tip in the distal esophagus but extending into the stomach. This should be withdrawn and readvanced. Persistent small bowel dilatation is noted. IMPRESSION: Gastric catheter as described. This should be withdrawn and readvanced. Persistent small bowel obstruction. Electronically Signed   By: Alcide Clever M.D.   On: 10/31/2022 02:29   DG Chest Port 1 View  Result Date: 10/30/2022 CLINICAL DATA:  Shortness of breath. EXAM: PORTABLE CHEST 1 VIEW COMPARISON:  Radiographs 10/26/2022 and 10/20/2022.  CT 10/20/2022. FINDINGS: 1031 hours. Right IJ Port-A-Cath extends to the lower SVC level. Enteric tube remains in place. The heart size and mediastinal contours are stable. The lungs remain clear. No  pleural effusion, pneumothorax or acute osseous abnormality identified. Dilated small bowel noted in the upper abdomen. IMPRESSION: No evidence of acute cardiopulmonary process. Right IJ Port-A-Cath in place. Electronically Signed   By: Carey Bullocks M.D.   On: 10/30/2022  11:01   DG Abd 1 View  Result Date: 10/30/2022 CLINICAL DATA:  Small-bowel obstruction. EXAM: ABDOMEN - 1 VIEW COMPARISON:  Radiographs 10/28/2022.  CT 10/29/2022. FINDINGS: 0657 hours. Enteric tube projects over the right upper quadrant of the abdomen, likely in the distal stomach. Persistent moderate proximal to mid small bowel distension, similar to recent CT. The colon is decompressed. No supine evidence of pneumoperitoneum. There are skin staples in the pelvis related to recent sigmoid colon resection and right lower quadrant loop ileostomy. IMPRESSION: Persistent small bowel obstruction pattern status post loop ileostomy. Obstruction may relate to parastomal herniation of small bowel based on recent CT. Electronically Signed   By: Carey Bullocks M.D.   On: 10/30/2022 10:59   CT ABDOMEN PELVIS W CONTRAST  Result Date: 10/29/2022 CLINICAL DATA:  Postoperative abdominal pain. History of perforated diverticulitis. History of bladder cancer. EXAM: CT ABDOMEN AND PELVIS WITH CONTRAST TECHNIQUE: Multidetector CT imaging of the abdomen and pelvis was performed using the standard protocol following bolus administration of intravenous contrast. RADIATION DOSE REDUCTION: This exam was performed according to the departmental dose-optimization program which includes automated exposure control, adjustment of the mA and/or kV according to patient size and/or use of iterative reconstruction technique. CONTRAST:  OMNIPAQUE IOHEXOL 300 MG/ML  SOLN COMPARISON:  October 20, 2022. FINDINGS: Lower chest: No acute abnormality. Hepatobiliary: No cholelithiasis or biliary dilatation is noted. Small right hepatic cyst is noted posteriorly and inferiorly. Pancreas: Unremarkable. No pancreatic ductal dilatation or surrounding inflammatory changes. Spleen: Normal in size without focal abnormality. Adrenals/Urinary Tract: Adrenal glands appear normal. Mild bilateral renal cortical scarring is noted. Stable right renal cyst is  noted. No hydronephrosis or renal obstruction is noted. Urinary bladder is unremarkable. Stomach/Bowel: Nasogastric tube tip is seen in the stomach. Status post sigmoid resection. Loop ileostomy is noted in right lower quadrant with associated small peristomal hernia. Severe small bowel dilatation is noted with transition zone as the bowel enters the ostomy site in the right lower quadrant consistent with obstruction. Fecalization is noted in the more distal portions of the small bowel near the obstruction site. Vascular/Lymphatic: Aortic atherosclerosis. No enlarged abdominal or pelvic lymph nodes. Reproductive: Stable mild prostatic enlargement. Other: No ascites is noted. Musculoskeletal: No acute or significant osseous findings. IMPRESSION: Status post partial sigmoid resection and placement of loop ileostomy in right lower quadrant for treatment of perforated diverticulitis. There is severe small bowel obstruction with transition zone seen as the bowel enters ostomy site and fecalization is noted in the more distal portions of the small bowel near the obstruction site. These results will be called to the ordering clinician or representative by the Radiologist Assistant, and communication documented in the PACS or zVision Dashboard. Electronically Signed   By: Lupita Raider M.D.   On: 10/29/2022 14:18   DG Abd 1 View  Result Date: 10/28/2022 CLINICAL DATA:  Check gastric catheter placement EXAM: ABDOMEN - 1 VIEW COMPARISON:  None Available. FINDINGS: Gastric catheter is noted within the stomach. Multiple air-filled loops of dilated small bowel are seen. IMPRESSION: Gastric catheter in the stomach. Multiple dilated loops of small bowel. This may represent a postoperative ileus. Electronically Signed   By: Alcide Clever M.D.   On: 10/28/2022 21:14  DG Chest Port 1 View  Result Date: 10/26/2022 CLINICAL DATA:  77 year old male status post nasogastric tube placement. EXAM: PORTABLE CHEST 1 VIEW COMPARISON:   Chest x-ray 10/20/2022. FINDINGS: Right internal jugular single-lumen Port-A-Cath with tip terminating in the distal superior vena cava. A nasogastric tube is seen extending into the stomach, however, the tip of the nasogastric tube extends below the lower margin of the image. Lungs are hyperexpanded with emphysematous changes. No acute consolidative airspace disease. No pleural effusions. No pneumothorax. No evidence of pulmonary edema. Heart size is normal. Upper mediastinal contours are within normal limits. Atherosclerotic calcifications are noted in the thoracic aorta. IMPRESSION: 1. Support apparatus, as above. 2. No radiographic evidence of acute cardiopulmonary disease. 3. Emphysema. 4. Aortic atherosclerosis. Electronically Signed   By: Trudie Reed M.D.   On: 10/26/2022 09:15     Assessment and plan- Patient is a 77 y.o. male who is here for routine f/u of bladder cancer  Patient had CT angio chest as well as CT abdomen and pelvis in June 2024 when he was hospitalized for episode of perforated sigmoid diverticulitis.  Scans at that time did not show any evidence of recurrent or progressive disease.  I also reviewed pathology from sigmoidectomy which was done for his perforated diverticulitis which did not reveal any evidence of malignancy.  I will see him back in 6 months with scans.  He will likely have an ileostomy reversal in 3 to 4 months time   Visit Diagnosis 1. Encounter for follow-up surveillance of bladder cancer      Dr. Owens Shark, MD, MPH [facility] [phone] 11/22/2022 1:13 PM

## 2022-11-28 ENCOUNTER — Other Ambulatory Visit: Payer: Self-pay | Admitting: Urology

## 2022-12-11 ENCOUNTER — Ambulatory Visit: Payer: Medicare HMO | Admitting: Urology

## 2022-12-11 VITALS — BP 130/80 | HR 97 | Ht 69.0 in | Wt 136.0 lb

## 2022-12-11 DIAGNOSIS — R3129 Other microscopic hematuria: Secondary | ICD-10-CM | POA: Diagnosis not present

## 2022-12-11 DIAGNOSIS — Z8551 Personal history of malignant neoplasm of bladder: Secondary | ICD-10-CM | POA: Diagnosis not present

## 2022-12-11 DIAGNOSIS — C674 Malignant neoplasm of posterior wall of bladder: Secondary | ICD-10-CM

## 2022-12-11 LAB — MICROSCOPIC EXAMINATION

## 2022-12-11 LAB — URINALYSIS, COMPLETE
Bilirubin, UA: NEGATIVE
Glucose, UA: NEGATIVE
Ketones, UA: NEGATIVE
Leukocytes,UA: NEGATIVE
Nitrite, UA: NEGATIVE
Protein,UA: NEGATIVE
RBC, UA: NEGATIVE
Specific Gravity, UA: 1.01 (ref 1.005–1.030)
Urobilinogen, Ur: 0.2 mg/dL (ref 0.2–1.0)
pH, UA: 5.5 (ref 5.0–7.5)

## 2022-12-11 NOTE — Progress Notes (Signed)
   12/11/22 CC:  Chief Complaint  Patient presents with   Cysto     HPI: Joe Torres is a 77 y.o. male with a personal history of muscle invasive bladder cancer , who returns today for surveillance cystoscopy.  He was taken to the OR for TURBT on 05/22/2020 at which time several overlapping posterior bladder wall tumors were identified, at least 2.5 cm as well as a more worrisome irregular nodular tumor in the left lateral bladder wall which was highly suspicious for muscle invasive cancer.   He is now status post chemo 5-FU / radiation.   Most recently, he had a prolonged hospital admission for diverticulitis complicated by perforation requiring diversion.  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  As part of this hospitalizations he did have multiple cross-sectional imaging studies demonstrating bilateral renal atrophy, stable right renal cyst but no hydronephrosis or bladder issues.  There is no evidence of interval metastatic disease.  He has CT scan scheduled in January with Dr. Smith Robert.  Urine today negative.  He denies any urinary issues.   Vitals:   12/11/22 1044  BP: 130/80  Pulse: 97    NED. A&Ox3.   No respiratory distress   Abd soft, NT, ND Normal phallus with bilateral descended testicles  Cystoscopy Procedure Note  Patient identification was confirmed, informed consent was obtained, and patient was prepped using Betadine solution.  Lidocaine jelly was administered per urethral meatus.     Pre-Procedure: - Inspection reveals a normal caliber ureteral meatus.  Procedure: The flexible cystoscope was introduced without difficulty - No urethral strictures/lesions are present. - Enlarged prostate  - Normal bladder neck - Bilateral ureteral orifices identified - No bladder stones - moderate trabeculation - TURBT resection scar on base of bladder moderately trabeculated -Very subtle cobblestoning type appearance at the trigone as well as adjacent to the left UO, not  previously appreciated.  There is no obvious papillary change.  Post-Procedure: - Patient tolerated the procedure well   Assessment/ Plan:  History of bladder cancer -Cystoscopy today with some fine texture eyes changes, new from last cystoscopy but no obvious recurrence; ?  Inflammatory in light of recent diverticulitis -Plan for repeat cystoscopy with special attention this area in January after his next CT scan.  He is agreeable this plan.  All questions answered.  F/u Jan for cysto  Vanna Scotland, MD

## 2022-12-17 ENCOUNTER — Ambulatory Visit (HOSPITAL_COMMUNITY): Payer: Medicare HMO | Admitting: Nurse Practitioner

## 2022-12-20 ENCOUNTER — Ambulatory Visit (HOSPITAL_COMMUNITY)
Admission: RE | Admit: 2022-12-20 | Discharge: 2022-12-20 | Disposition: A | Discharge status: Home / Self Care | Payer: Medicare HMO | Source: Ambulatory Visit | Attending: Nurse Practitioner | Admitting: Nurse Practitioner

## 2022-12-20 DIAGNOSIS — Z432 Encounter for attention to ileostomy: Secondary | ICD-10-CM | POA: Diagnosis not present

## 2022-12-20 DIAGNOSIS — Z932 Ileostomy status: Secondary | ICD-10-CM | POA: Diagnosis not present

## 2022-12-20 DIAGNOSIS — Z9981 Dependence on supplemental oxygen: Secondary | ICD-10-CM | POA: Insufficient documentation

## 2022-12-20 DIAGNOSIS — K435 Parastomal hernia without obstruction or  gangrene: Secondary | ICD-10-CM | POA: Diagnosis not present

## 2022-12-20 DIAGNOSIS — K4091 Unilateral inguinal hernia, without obstruction or gangrene, recurrent: Secondary | ICD-10-CM

## 2022-12-20 DIAGNOSIS — J4489 Other specified chronic obstructive pulmonary disease: Secondary | ICD-10-CM | POA: Insufficient documentation

## 2022-12-20 NOTE — Progress Notes (Signed)
[facility]   Reason for visit:  RLQ ileostomy HPI:  COPD oxygen dependent Past Medical History:  Diagnosis Date   Arthritis    Asthma    Bladder cancer (HCC)    Cancer (HCC)    COPD (chronic obstructive pulmonary disease) (HCC)    Dyspnea    GERD (gastroesophageal reflux disease)    Hypertension    Family History  Problem Relation Age of Onset   Asthma Mother    Allergies  Allergen Reactions   Shrimp [Shellfish Allergy] Other (See Comments)    Per MD comments critical   Current Outpatient Medications  Medication Sig Dispense Refill Last Dose   acetaminophen (TYLENOL) 500 MG tablet Take 500 mg by mouth every 8 (eight) hours as needed for moderate pain.      albuterol (PROVENTIL HFA;VENTOLIN HFA) 108 (90 Base) MCG/ACT inhaler Inhale 2 puffs into the lungs every 4 (four) hours as needed for wheezing or shortness of breath.      albuterol (PROVENTIL) (2.5 MG/3ML) 0.083% nebulizer solution Take 2.5 mg by nebulization every 4 (four) hours as needed for wheezing or shortness of breath.      amLODipine (NORVASC) 10 MG tablet Take 10 mg by mouth daily.      arformoterol (BROVANA) 15 MCG/2ML NEBU Take 15 mcg by nebulization 2 (two) times daily.      budesonide (PULMICORT) 0.5 MG/2ML nebulizer solution Take 0.5 mg by nebulization 2 (two) times daily.      cetirizine (ZYRTEC) 10 MG tablet Chew 10 mg by mouth daily.      diclofenac Sodium (VOLTAREN) 1 % GEL Apply 1 application topically 2 (two) times daily as needed (pain).      Dupilumab 300 MG/2ML SOPN Inject into the skin.      fluticasone (FLONASE) 50 MCG/ACT nasal spray Place 1 spray into both nostrils daily.      lidocaine-prilocaine (EMLA) cream Apply to affected area once 30 g 3    losartan (COZAAR) 25 MG tablet Take 25 mg by mouth daily.      montelukast (SINGULAIR) 10 MG tablet Take 10 mg by mouth at bedtime.      pantoprazole (PROTONIX) 40 MG tablet Take 40 mg by mouth daily.      potassium chloride SA (KLOR-CON  M20) 20 MEQ tablet Take 1 tablet (20 mEq total) by mouth 2 (two) times daily for 7 days. (Patient not taking: Reported on 11/22/2022) 14 tablet 0    predniSONE (DELTASONE) 10 MG tablet Take 1 tablet (10 mg total) by mouth daily. Take 20 mg daily x 3 days then resume 10 mg daily afterwards      roflumilast (DALIRESP) 500 MCG TABS tablet Take 500 mcg by mouth daily.      simvastatin (ZOCOR) 20 MG tablet Take by mouth.      sucralfate (CARAFATE) 1 g tablet Take by mouth.      tamsulosin (FLOMAX) 0.4 MG CAPS capsule TAKE 1 CAPSULE BY MOUTH EVERY DAY 30 capsule 0    tiotropium (SPIRIVA) 18 MCG inhalation capsule Place 18 mcg into inhaler and inhale daily.      traMADol (ULTRAM) 50 MG tablet Take 50 mg by mouth 2 (two) times daily.      No current facility-administered medications for this encounter.   ROS  Review of Systems  Constitutional:  Positive for fatigue.  Respiratory:  Positive for cough and shortness of breath.        COPD  Oxygen dependent  Gastrointestinal:  RLQ ileostomy  Skin:  Positive for color change.  Psychiatric/Behavioral: Negative.    All other systems reviewed and are negative.  Vital signs:  BP (!) 145/75   Pulse (!) 104   Temp 97.9 F (36.6 C)   Resp 16   SpO2 94%  Exam:  Physical Exam Vitals reviewed.  Constitutional:      Appearance: Normal appearance.  Pulmonary:     Breath sounds: Rhonchi present.     Comments: COPD   Shortness of breath with oxygen Abdominal:     Palpations: Abdomen is soft.     Hernia: A hernia is present.  Skin:    General: Skin is warm and dry.  Neurological:     Mental Status: He is alert and oriented to person, place, and time.  Psychiatric:        Mood and Affect: Mood normal.        Behavior: Behavior normal.     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   Treatment options for stomal/peristomal skin: barrier ring and 2 piece pouch Output: soft brown stool Ostomy pouching: 2pc. With barrier ring  adding ostomy belt due to occasional leaks and to support the hernia  Education provided:  feels the belt supports him      Impression/dx  Parastomal hernia ileostomy Discussion  Discussed avoiding heavy lifting, no action needed for stoma as long as he is producing stool.  Plan  See back as needed to assess pouching and hernia    Visit time: 45 minutes.   Maple Hudson FNP-BC

## 2022-12-24 DIAGNOSIS — Z932 Ileostomy status: Secondary | ICD-10-CM | POA: Insufficient documentation

## 2022-12-24 DIAGNOSIS — K4091 Unilateral inguinal hernia, without obstruction or gangrene, recurrent: Secondary | ICD-10-CM | POA: Insufficient documentation

## 2022-12-24 NOTE — Discharge Instructions (Signed)
Added belt today to support weight of pouch with hernia

## 2023-01-16 ENCOUNTER — Other Ambulatory Visit: Payer: Self-pay | Admitting: Urology

## 2023-01-22 ENCOUNTER — Other Ambulatory Visit: Payer: Self-pay | Admitting: General Surgery

## 2023-01-22 ENCOUNTER — Encounter: Payer: Self-pay | Admitting: General Surgery

## 2023-01-22 DIAGNOSIS — K5720 Diverticulitis of large intestine with perforation and abscess without bleeding: Secondary | ICD-10-CM

## 2023-01-23 ENCOUNTER — Encounter: Payer: Self-pay | Admitting: Oncology

## 2023-01-27 ENCOUNTER — Ambulatory Visit
Admission: RE | Admit: 2023-01-27 | Discharge: 2023-01-27 | Disposition: A | Discharge status: Home / Self Care | Payer: Medicare HMO | Source: Ambulatory Visit | Attending: General Surgery | Admitting: General Surgery

## 2023-01-27 DIAGNOSIS — K5720 Diverticulitis of large intestine with perforation and abscess without bleeding: Secondary | ICD-10-CM | POA: Diagnosis present

## 2023-01-27 MED ORDER — IOHEXOL 300 MG/ML  SOLN
450.0000 mL | Freq: Once | INTRAMUSCULAR | Status: AC | PRN
  Administered 2023-01-27: 450 mL

## 2023-01-28 ENCOUNTER — Ambulatory Visit: Payer: Self-pay | Admitting: General Surgery

## 2023-01-29 ENCOUNTER — Encounter: Payer: Self-pay | Admitting: *Deleted

## 2023-02-03 ENCOUNTER — Other Ambulatory Visit: Payer: Self-pay

## 2023-02-03 ENCOUNTER — Encounter
Admission: RE | Admit: 2023-02-03 | Discharge: 2023-02-03 | Disposition: A | Discharge status: Home / Self Care | Payer: Medicare HMO | Source: Ambulatory Visit | Attending: General Surgery | Admitting: General Surgery

## 2023-02-03 DIAGNOSIS — Z01812 Encounter for preprocedural laboratory examination: Secondary | ICD-10-CM | POA: Diagnosis not present

## 2023-02-03 DIAGNOSIS — I10 Essential (primary) hypertension: Secondary | ICD-10-CM | POA: Diagnosis not present

## 2023-02-03 DIAGNOSIS — Z01818 Encounter for other preprocedural examination: Secondary | ICD-10-CM | POA: Diagnosis present

## 2023-02-03 DIAGNOSIS — J441 Chronic obstructive pulmonary disease with (acute) exacerbation: Secondary | ICD-10-CM | POA: Diagnosis not present

## 2023-02-03 HISTORY — DX: Unspecified osteoarthritis, unspecified site: M19.90

## 2023-02-03 HISTORY — DX: Panlobular emphysema: J43.1

## 2023-02-03 HISTORY — DX: Benign prostatic hyperplasia without lower urinary tract symptoms: N40.0

## 2023-02-03 HISTORY — DX: Chronic obstructive pulmonary disease, unspecified: J44.9

## 2023-02-03 HISTORY — DX: Diverticulitis of large intestine with perforation and abscess without bleeding: K57.20

## 2023-02-03 HISTORY — DX: Unspecified cataract: H26.9

## 2023-02-03 HISTORY — DX: Obstructive sleep apnea (adult) (pediatric): G47.33

## 2023-02-03 HISTORY — DX: Ileus, unspecified: K56.7

## 2023-02-03 HISTORY — DX: Unilateral inguinal hernia, without obstruction or gangrene, not specified as recurrent: K40.90

## 2023-02-03 HISTORY — DX: Ileus, unspecified: K91.89

## 2023-02-03 HISTORY — DX: Hyperlipidemia, unspecified: E78.5

## 2023-02-03 LAB — BASIC METABOLIC PANEL
Anion gap: 10 (ref 5–15)
BUN: 24 mg/dL — ABNORMAL HIGH (ref 8–23)
CO2: 27 mmol/L (ref 22–32)
Calcium: 9.4 mg/dL (ref 8.9–10.3)
Chloride: 102 mmol/L (ref 98–111)
Creatinine, Ser: 0.63 mg/dL (ref 0.61–1.24)
GFR, Estimated: >60 mL/min (ref >60)
Glucose, Bld: 107 mg/dL — ABNORMAL HIGH (ref 70–99)
Potassium: 4.1 mmol/L (ref 3.5–5.1)
Sodium: 139 mmol/L (ref 135–145)

## 2023-02-03 LAB — CBC
HCT: 43.3 % (ref 39.0–52.0)
Hemoglobin: 14.2 g/dL (ref 13.0–17.0)
MCH: 30 pg (ref 26.0–34.0)
MCHC: 32.8 g/dL (ref 30.0–36.0)
MCV: 91.5 fL (ref 80.0–100.0)
Platelets: 324 10*3/uL (ref 150–400)
RBC: 4.73 MIL/uL (ref 4.22–5.81)
RDW: 14.3 % (ref 11.5–15.5)
WBC: 13.2 10*3/uL — ABNORMAL HIGH (ref 4.0–10.5)
nRBC: 0 % (ref 0.0–0.2)

## 2023-02-03 NOTE — Patient Instructions (Addendum)
Your procedure is scheduled on: Wednesday, October 2 Report to the Registration Desk on the 1st floor of the [facility] To find out your arrival time, please call [phone] between 1PM - 3PM on: Tuesday, October 1 If your arrival time is 6:00 am, do not arrive before that time as the [facility] entrance doors do not open until 6:00 am.  REMEMBER: Instructions that are not followed completely may result in serious medical risk, up to and including death; or upon the discretion of your surgeon and anesthesiologist your surgery may need to be rescheduled.  Do not eat or drink after midnight the night before surgery.  No gum chewing or hard candies.  One week prior to surgery: starting September 25 Stop Anti-inflammatories (NSAIDS) such as Advil, Aleve, Ibuprofen, Motrin, Naproxen, Naprosyn and Aspirin based products such as Excedrin, Goody's Powder, BC Powder. Stop ANY OVER THE COUNTER supplements until after surgery. You may however, continue to take Tylenol if needed for pain up until the day of surgery.  Continue taking all prescribed medications   TAKE ONLY THESE MEDICATIONS THE MORNING OF SURGERY WITH A SIP OF WATER:  Brovana nebulizer Budesonide inhaler Pantoprazole (Protonix) - (take one the night before and one on the morning of surgery - helps to prevent nausea after surgery.) Potassium Prednisone Roflumilast (Daliresp) Tamsulosin (Flomax) Spiriva inhaler Tramadol if needed for pain  Use inhalers on the day of surgery and bring your albuterol inhaler to the hospital.  No Alcohol for 24 hours before or after surgery.  No Smoking including e-cigarettes for 24 hours before surgery.  No chewable tobacco products for at least 6 hours before surgery.  No nicotine patches on the day of surgery.  Do not use any "recreational" drugs for at least a week (preferably 2 weeks) before your surgery.  Please be advised that the combination of cocaine and anesthesia may have  negative outcomes, up to and including death. If you test positive for cocaine, your surgery will be cancelled.  On the morning of surgery brush your teeth with toothpaste and water, you may rinse your mouth with mouthwash if you wish. Do not swallow any toothpaste or mouthwash.  Use CHG Soap as directed on instruction sheet.  Do not wear jewelry, make-up, hairpins, clips or nail polish.  For welded (permanent) jewelry: bracelets, anklets, waist bands, etc.  Please have this removed prior to surgery.  If it is not removed, there is a chance that hospital personnel will need to cut it off on the day of surgery.  Do not wear lotions, powders, or perfumes.   Do not shave body hair from the neck down 48 hours before surgery.  Contact lenses, hearing aids and dentures may not be worn into surgery.  Do not bring valuables to the hospital. [facility] is not responsible for any missing/lost belongings or valuables.   Bring your C-PAP to the hospital in case you may have to spend the night.   Notify your doctor if there is any change in your medical condition (cold, fever, infection).  Wear comfortable clothing (specific to your surgery type) to the hospital.  After surgery, you can help prevent lung complications by doing breathing exercises.  Take deep breaths and cough every 1-2 hours. Your doctor may order a device called an Incentive Spirometer to help you take deep breaths. When coughing or sneezing, hold a pillow firmly against your incision with both hands. This is called "splinting." Doing this helps protect your incision. It also  decreases belly discomfort.  If you are being admitted to the hospital overnight, leave your suitcase in the car. After surgery it may be brought to your room.  In case of increased patient census, it may be necessary for you, the patient, to continue your postoperative care in the Same Day Surgery department.  If you are being discharged the day of  surgery, you will not be allowed to drive home. You will need a responsible individual to drive you home and stay with you for 24 hours after surgery.   If you are taking public transportation, you will need to have a responsible individual with you.  Please call the Pre-admissions Testing Dept. at [phone] if you have any questions about these instructions.  Surgery Visitation Policy:  Patients having surgery or a procedure may have two visitors.  Children under the age of 5 must have an adult with them who is not the patient.  Inpatient Visitation:    Visiting hours are 7 a.m. to 8 p.m. Up to four visitors are allowed at one time in a patient room. The visitors may rotate out with other people during the day.  One visitor age 84 or older may stay with the patient overnight and must be in the room by 8 p.m.    Su trmite est programado para el: mircoles 2 de Administrator de Chartered certified accountant piso del [facility] Para saber su hora de llegada, llame al [phone] entre la 1:00 p. m. y las 3:00 p. m. el martes 1 de [place]. Si su hora de llegada es a las 6:00 am, no llegue antes de esa hora ya que las puertas de [place] del [facility] no se abren hasta las 6:00 am.  RECORDAR: Las instrucciones que no se siguen completamente pueden generar riesgos mdicos graves, que pueden llegar hasta la [place]; o, segn el criterio de su cirujano y Scientific laboratory technician, es posible que sea Aeronautical engineer su Leisure centre manager.  No coma ni beba despus de la medianoche del da anterior a la ciruga.  No mascar chicle ni caramelos duros.  Una semana antes de la ciruga: a partir del 25 de septiembre Detenga los antiinflamatorios (AINE) como Advil, Aleve, Ibuprofeno, Motrin, Naproxen, Naprosyn y productos a base de aspirina como Excedrin, Goody's Powder, BC Powder. Suspenda CUALQUIER suplemento de venta libre hasta despus de la [place]. Sin embargo, puede Educational psychologist  tomando Tylenol si es necesario para Marketing executive de la [place].  Contine tomando todos los medicamentos recetados.   TOME SLO ESTOS MEDICAMENTOS LA MAANA DE LA CIRUGA CON UN SORBO DE AGUA:  1. Nebulizador Brovana 2. Inhalador de budesonida 3. Pantoprazol (Protonix): (tome uno la noche anterior y [employer] maana de la [place]; [place] a prevenir las nuseas despus de la [place]). 4. potasio 5. prednisona 6. Roflumilast (Daliresp) 7. Tamsulosina (Flomax) 8. Inhalador de espiriva 9. Tramadol si es necesario para Chief Technology Officer.  Utilice inhaladores el da de la ciruga y lleve su inhalador de albuterol al hospital.  No consumir alcohol durante 24 horas antes o despus de la [place].  No fumar, incluidos los cigarrillos electrnicos, durante las 24 horas previas a la [place].  No consumir productos de tabaco masticables durante al menos 6 horas antes de la [place].  Sin parches de Optometrist de la [place].  No use ningn medicamento "recreativo" durante al menos una semana (preferiblemente 2 semanas) antes de la ciruga.  Tenga en cuenta que la  combinacin de cocana y anestesia puede [address], que pueden llegar hasta la [place]. Si su prueba de cocana da positivo, su ciruga ser cancelada.  La maana de la ciruga cepille sus dientes con pasta dental y agua, puede enjuagarse la boca con enjuague bucal si lo desea. No ingiera pasta de dientes ni enjuague bucal.  Utilice el jabn CHG como se indica en la hoja de instrucciones.  No use joyas, maquillaje, horquillas, clips ni esmalte de uas.  Para joyera soldada (permanente): pulseras, tobilleras, cinturillas, etc. Retrelos antes de la ciruga.  Si no se extrae, existe la posibilidad de que el personal del hospital deba cortrselo el da de la [place].  No use lociones, polvos ni perfumes.   No se afeite el vello corporal desde el cuello hacia abajo 48 horas antes de la [place].  No se pueden usar  lentes de contacto, audfonos ni dentaduras postizas durante la [place].  No lleve objetos de valor al hospital. [facility] no es responsable de ninguna pertenencia u objeto de valor perdido o perdido.   Lleve su C-PAP al hospital en caso de que tenga que pasar la noche.   Notifique a su mdico si hay algn cambio en su condicin mdica (resfriado, fiebre, infeccin).  Lleve ropa cmoda (especfica para su tipo de [place]) al hospital.  Despus de la ciruga, usted puede ayudar a prevenir complicaciones pulmonares haciendo ejercicios de respiracin.  Respire profundamente y tosa cada 1 o 2 horas. Su mdico puede indicarle un dispositivo llamado espirmetro incentivador para ayudarle a respirar profundamente. Al toser o Engineering geologist, sostenga firmemente una almohada contra la incisin con ambas manos. Esto se llama "ferulizacin". Hacer esto ayuda a proteger su incisin. Tambin disminuye las molestias abdominales.  Si va a pasar la noche en el hospital, deje su maleta en el coche. Despus de la [place], es posible que lo lleven a su habitacin.  En caso de un mayor censo de [place], puede ser necesario que usted, el [place], contine con su atencin posoperatoria en el departamento de [place] el [place].  Si le dan el alta el da de la [place], no se le permitir conducir a casa. Necesitar que una persona responsable lo lleve a su casa y se quede con usted durante las 24 horas posteriores a la [place].   Si viaja en transporte pblico, deber ir acompaado de una persona responsable.  Llame al Departamento de pruebas previas a la admisin al [phone] si tiene alguna pregunta sobre estas instrucciones.  Poltica de visitas a ciruga:  Los [employer] se someten a [place] o un procedimiento pueden [employer] visitantes.  Los nios menores de 16 aos deben estar acompaados por un adulto que no sea el [place].  Visitas para pacientes hospitalizados:    El horario de  visita es de 7 a 20 horas. Se permiten hasta cuatro visitantes a la vez en la habitacin de un paciente. Los visitantes podrn rotar con Garment/textile technologist.  Un visitante de 16 aos o ms puede quedarse con el paciente durante la noche y Scientist, research (physical sciences) en la habitacin antes de las 8 p. m.      Preparacin para la ciruga con jabn de GLUCONATO DE CLORHEXIDINA (CHG)  Jabn de gluconato de clorhexidina (CHG)  * Un limpiador antisptico que [employer] grmenes y se adhiere a la piel para seguir [employer] grmenes incluso despus del lavado.   *Se utiliza para ducharse la noche anterior a la [place] y  la maana de la [place].  Antes de la [place], usted puede desempear un papel importante al reducir la cantidad de grmenes en su piel. El jabn CHG (gluconato de clorhexidina) es un limpiador antisptico que mata los grmenes y se adhiere a la piel para continuar matndolos incluso despus del lavado.  No lo utilice si es alrgico al CHG o a los jabones antibacterianos. Si su piel se enrojece o irrita, deje de usar CHG.  1. Ducharse la NOCHE ANTES DE LA CIRUGA y la Plaza DE LA CIRUGA con jabn CHG.  2. Si eliges lavarte el cabello, lvalo primero como de costumbre con tu champ habitual.  3. Despus del champ, enjuague bien el cabello y el cuerpo para eliminar el champ.  4. Utilice CHG como lo hara con cualquier otro jabn lquido. Puede aplicar CHG directamente sobre la piel y lavar suavemente con un pauelo o una toallita limpia.  5. Aplique el jabn CHG en su cuerpo nicamente desde el cuello hacia abajo. No utilizar en heridas abiertas o llagas abiertas. Evite el contacto con los ojos, odos, boca y genitales (partes privadas). Lvese la cara y los genitales (partes privadas) con su jabn habitual.  6. Lvese bien, prestando especial atencin al rea donde se realizar su ciruga.  7. Enjuague bien su cuerpo con agua tibia.  8. No se duche ni se lave con su jabn  normal despus de usar y enjuagar el jabn CHG.  9. Squese dando palmaditas con una toalla limpia.  10. Use pijamas limpios para dormir la noche anterior a la ciruga.  12. Coloque sbanas limpias en su cama la noche de su primera ducha y no duerma con mascotas.  13. Ducharse nuevamente con el jabn CHG el da de la ciruga antes de llegar al hospital.  14. No aplique desodorantes, lociones o polvos.  15. Por favor use ropa limpia al hospital.

## 2023-02-12 ENCOUNTER — Inpatient Hospital Stay
Admission: RE | Admit: 2023-02-12 | Discharge: 2023-02-21 | DRG: 329 | Disposition: A | Discharge status: Home / Self Care | Payer: Medicare HMO | Source: Ambulatory Visit | Attending: General Surgery | Admitting: General Surgery

## 2023-02-12 ENCOUNTER — Encounter: Payer: Self-pay | Admitting: General Surgery

## 2023-02-12 ENCOUNTER — Encounter
Admission: RE | Disposition: A | Discharge status: Home / Self Care | Payer: Self-pay | Source: Ambulatory Visit | Attending: General Surgery

## 2023-02-12 ENCOUNTER — Inpatient Hospital Stay: Payer: Medicare HMO | Admitting: Urgent Care

## 2023-02-12 ENCOUNTER — Inpatient Hospital Stay: Payer: Medicare HMO

## 2023-02-12 ENCOUNTER — Inpatient Hospital Stay: Payer: Self-pay | Admitting: Certified Registered"

## 2023-02-12 ENCOUNTER — Other Ambulatory Visit: Payer: Self-pay

## 2023-02-12 DIAGNOSIS — K9189 Other postprocedural complications and disorders of digestive system: Secondary | ICD-10-CM | POA: Diagnosis not present

## 2023-02-12 DIAGNOSIS — Z9981 Dependence on supplemental oxygen: Secondary | ICD-10-CM | POA: Diagnosis not present

## 2023-02-12 DIAGNOSIS — Z01812 Encounter for preprocedural laboratory examination: Principal | ICD-10-CM

## 2023-02-12 DIAGNOSIS — Z79899 Other long term (current) drug therapy: Secondary | ICD-10-CM | POA: Diagnosis not present

## 2023-02-12 DIAGNOSIS — Z87891 Personal history of nicotine dependence: Secondary | ICD-10-CM

## 2023-02-12 DIAGNOSIS — Z8551 Personal history of malignant neoplasm of bladder: Secondary | ICD-10-CM | POA: Diagnosis not present

## 2023-02-12 DIAGNOSIS — J441 Chronic obstructive pulmonary disease with (acute) exacerbation: Secondary | ICD-10-CM

## 2023-02-12 DIAGNOSIS — K565 Intestinal adhesions [bands], unspecified as to partial versus complete obstruction: Secondary | ICD-10-CM | POA: Diagnosis present

## 2023-02-12 DIAGNOSIS — Z432 Encounter for attention to ileostomy: Principal | ICD-10-CM

## 2023-02-12 DIAGNOSIS — J4489 Other specified chronic obstructive pulmonary disease: Secondary | ICD-10-CM | POA: Diagnosis present

## 2023-02-12 DIAGNOSIS — Z7951 Long term (current) use of inhaled steroids: Secondary | ICD-10-CM | POA: Diagnosis not present

## 2023-02-12 DIAGNOSIS — L89899 Pressure ulcer of other site, unspecified stage: Secondary | ICD-10-CM | POA: Diagnosis present

## 2023-02-12 DIAGNOSIS — K219 Gastro-esophageal reflux disease without esophagitis: Secondary | ICD-10-CM | POA: Diagnosis present

## 2023-02-12 DIAGNOSIS — I10 Essential (primary) hypertension: Secondary | ICD-10-CM | POA: Diagnosis present

## 2023-02-12 DIAGNOSIS — E785 Hyperlipidemia, unspecified: Secondary | ICD-10-CM | POA: Diagnosis present

## 2023-02-12 DIAGNOSIS — N400 Enlarged prostate without lower urinary tract symptoms: Secondary | ICD-10-CM | POA: Diagnosis present

## 2023-02-12 DIAGNOSIS — K567 Ileus, unspecified: Secondary | ICD-10-CM | POA: Diagnosis not present

## 2023-02-12 DIAGNOSIS — J9621 Acute and chronic respiratory failure with hypoxia: Secondary | ICD-10-CM | POA: Diagnosis not present

## 2023-02-12 DIAGNOSIS — Z825 Family history of asthma and other chronic lower respiratory diseases: Secondary | ICD-10-CM

## 2023-02-12 DIAGNOSIS — Z79620 Long term (current) use of immunosuppressive biologic: Secondary | ICD-10-CM

## 2023-02-12 DIAGNOSIS — G4733 Obstructive sleep apnea (adult) (pediatric): Secondary | ICD-10-CM | POA: Diagnosis present

## 2023-02-12 DIAGNOSIS — Z932 Ileostomy status: Secondary | ICD-10-CM

## 2023-02-12 HISTORY — PX: ILEOSTOMY CLOSURE: SHX1784

## 2023-02-12 HISTORY — PX: APPENDECTOMY: SHX54

## 2023-02-12 SURGERY — CLOSURE, ILEOSTOMY
Anesthesia: General | Site: Abdomen

## 2023-02-12 MED ORDER — DUPILUMAB 300 MG/2ML ~~LOC~~ SOAJ: 300.0000 mg | SUBCUTANEOUS | Status: DC

## 2023-02-12 MED ORDER — TRAMADOL HCL 50 MG PO TABS
50.0000 mg | ORAL_TABLET | Freq: Four times a day (QID) | ORAL | Status: DC | PRN
  Administered 2023-02-12 – 2023-02-18 (×6): 50 mg via ORAL
  Filled 2023-02-12 (×7): qty 1

## 2023-02-12 MED ORDER — POTASSIUM CHLORIDE CRYS ER 20 MEQ PO TBCR
20.0000 meq | EXTENDED_RELEASE_TABLET | Freq: Two times a day (BID) | ORAL | Status: DC
  Administered 2023-02-12 – 2023-02-21 (×17): 20 meq via ORAL
  Filled 2023-02-12 (×17): qty 1

## 2023-02-12 MED ORDER — LIDOCAINE HCL (CARDIAC) PF 100 MG/5ML IV SOSY
PREFILLED_SYRINGE | INTRAVENOUS | Status: DC | PRN
  Administered 2023-02-12: 100 mg via INTRAVENOUS

## 2023-02-12 MED ORDER — GLYCOPYRROLATE 0.2 MG/ML IJ SOLN
INTRAMUSCULAR | Status: DC | PRN
  Administered 2023-02-12: .2 mg via INTRAVENOUS

## 2023-02-12 MED ORDER — TAMSULOSIN HCL 0.4 MG PO CAPS
0.4000 mg | ORAL_CAPSULE | Freq: Every day | ORAL | Status: DC
  Administered 2023-02-13 – 2023-02-21 (×9): 0.4 mg via ORAL
  Filled 2023-02-12 (×9): qty 1

## 2023-02-12 MED ORDER — BUPIVACAINE-EPINEPHRINE (PF) 0.25% -1:200000 IJ SOLN
INTRAMUSCULAR | Status: AC
  Filled 2023-02-12: qty 30

## 2023-02-12 MED ORDER — LORATADINE 10 MG PO TABS
10.0000 mg | ORAL_TABLET | Freq: Every day | ORAL | Status: DC
  Administered 2023-02-12 – 2023-02-21 (×10): 10 mg via ORAL
  Filled 2023-02-12 (×10): qty 1

## 2023-02-12 MED ORDER — HYDROMORPHONE HCL 1 MG/ML IJ SOLN
0.2500 mg | INTRAMUSCULAR | Status: DC | PRN
  Administered 2023-02-12 (×2): 0.5 mg via INTRAVENOUS

## 2023-02-12 MED ORDER — ALBUTEROL SULFATE HFA 108 (90 BASE) MCG/ACT IN AERS: 2.0000 | INHALATION_SPRAY | RESPIRATORY_TRACT | Status: DC | PRN

## 2023-02-12 MED ORDER — ALBUTEROL SULFATE HFA 108 (90 BASE) MCG/ACT IN AERS
INHALATION_SPRAY | RESPIRATORY_TRACT | Status: DC | PRN
Start: 2023-02-12 — End: 2023-02-12
  Administered 2023-02-12: 2 via RESPIRATORY_TRACT

## 2023-02-12 MED ORDER — ALBUTEROL SULFATE (2.5 MG/3ML) 0.083% IN NEBU: 2.5000 mg | INHALATION_SOLUTION | RESPIRATORY_TRACT | Status: DC | PRN

## 2023-02-12 MED ORDER — LACTATED RINGERS IV SOLN: INTRAVENOUS | Status: DC

## 2023-02-12 MED ORDER — DEXAMETHASONE SODIUM PHOSPHATE 10 MG/ML IJ SOLN
INTRAMUSCULAR | Status: DC | PRN
  Administered 2023-02-12: 10 mg via INTRAVENOUS

## 2023-02-12 MED ORDER — PANTOPRAZOLE SODIUM 40 MG PO TBEC
40.0000 mg | DELAYED_RELEASE_TABLET | Freq: Every day | ORAL | Status: DC
  Administered 2023-02-13 – 2023-02-16 (×4): 40 mg via ORAL
  Filled 2023-02-12 (×4): qty 1

## 2023-02-12 MED ORDER — SUGAMMADEX SODIUM 200 MG/2ML IV SOLN
INTRAVENOUS | Status: DC | PRN
  Administered 2023-02-12: 200 mg via INTRAVENOUS

## 2023-02-12 MED ORDER — VASOPRESSIN 20 UNIT/ML IV SOLN
INTRAVENOUS | Status: DC | PRN
  Administered 2023-02-12 (×3): 1 [IU] via INTRAVENOUS

## 2023-02-12 MED ORDER — PREDNISONE 10 MG PO TABS
10.0000 mg | ORAL_TABLET | Freq: Every day | ORAL | Status: DC
  Administered 2023-02-13: 10 mg via ORAL
  Filled 2023-02-12: qty 1

## 2023-02-12 MED ORDER — LOSARTAN POTASSIUM 25 MG PO TABS
25.0000 mg | ORAL_TABLET | Freq: Every day | ORAL | Status: DC
  Administered 2023-02-13 – 2023-02-21 (×9): 25 mg via ORAL
  Filled 2023-02-12 (×10): qty 1

## 2023-02-12 MED ORDER — CEFAZOLIN SODIUM-DEXTROSE 2-4 GM/100ML-% IV SOLN
2.0000 g | INTRAVENOUS | Status: AC
  Administered 2023-02-12: 2 g via INTRAVENOUS

## 2023-02-12 MED ORDER — ONDANSETRON HCL 4 MG/2ML IJ SOLN
INTRAMUSCULAR | Status: DC | PRN
  Administered 2023-02-12 (×2): 4 mg via INTRAVENOUS

## 2023-02-12 MED ORDER — ORAL CARE MOUTH RINSE: 15.0000 mL | Freq: Once | OROMUCOSAL | Status: AC

## 2023-02-12 MED ORDER — BUPIVACAINE-EPINEPHRINE 0.25% -1:200000 IJ SOLN
INTRAMUSCULAR | Status: DC | PRN
Start: 2023-02-12 — End: 2023-02-12
  Administered 2023-02-12: 30 mL

## 2023-02-12 MED ORDER — ALBUMIN HUMAN 5 % IV SOLN
INTRAVENOUS | Status: AC
  Filled 2023-02-12: qty 250

## 2023-02-12 MED ORDER — PHENYLEPHRINE 80 MCG/ML (10ML) SYRINGE FOR IV PUSH (FOR BLOOD PRESSURE SUPPORT)
PREFILLED_SYRINGE | INTRAVENOUS | Status: DC | PRN
  Administered 2023-02-12: 240 ug via INTRAVENOUS
  Administered 2023-02-12: 80 ug via INTRAVENOUS

## 2023-02-12 MED ORDER — TIOTROPIUM BROMIDE MONOHYDRATE 18 MCG IN CAPS
18.0000 ug | ORAL_CAPSULE | Freq: Every day | RESPIRATORY_TRACT | Status: DC
  Administered 2023-02-13 – 2023-02-21 (×9): 18 ug via RESPIRATORY_TRACT
  Filled 2023-02-12 (×2): qty 5

## 2023-02-12 MED ORDER — HYDROMORPHONE HCL 1 MG/ML IJ SOLN
INTRAMUSCULAR | Status: AC
  Filled 2023-02-12: qty 1

## 2023-02-12 MED ORDER — ENOXAPARIN SODIUM 40 MG/0.4ML IJ SOSY
40.0000 mg | PREFILLED_SYRINGE | INTRAMUSCULAR | Status: DC
  Administered 2023-02-13 – 2023-02-21 (×9): 40 mg via SUBCUTANEOUS
  Filled 2023-02-12 (×9): qty 0.4

## 2023-02-12 MED ORDER — IPRATROPIUM-ALBUTEROL 0.5-2.5 (3) MG/3ML IN SOLN
3.0000 mL | Freq: Once | RESPIRATORY_TRACT | Status: AC
  Administered 2023-02-12: 3 mL via RESPIRATORY_TRACT

## 2023-02-12 MED ORDER — MORPHINE SULFATE (PF) 4 MG/ML IV SOLN
4.0000 mg | INTRAVENOUS | Status: DC | PRN
  Administered 2023-02-12 – 2023-02-21 (×40): 4 mg via INTRAVENOUS
  Filled 2023-02-12 (×40): qty 1

## 2023-02-12 MED ORDER — ARFORMOTEROL TARTRATE 15 MCG/2ML IN NEBU
15.0000 ug | INHALATION_SOLUTION | Freq: Two times a day (BID) | RESPIRATORY_TRACT | Status: DC
  Administered 2023-02-12 – 2023-02-13 (×2): 15 ug via RESPIRATORY_TRACT
  Filled 2023-02-12 (×3): qty 2

## 2023-02-12 MED ORDER — PHENYLEPHRINE HCL-NACL 20-0.9 MG/250ML-% IV SOLN
INTRAVENOUS | Status: DC | PRN
Start: 2023-02-12 — End: 2023-02-12
  Administered 2023-02-12: 50 ug/min via INTRAVENOUS

## 2023-02-12 MED ORDER — INFLUENZA VAC A&B SURF ANT ADJ 0.5 ML IM SUSY
0.5000 mL | PREFILLED_SYRINGE | INTRAMUSCULAR | Status: DC
  Filled 2023-02-12: qty 0.5

## 2023-02-12 MED ORDER — BUPIVACAINE LIPOSOME 1.3 % IJ SUSP
INTRAMUSCULAR | Status: DC | PRN
  Administered 2023-02-12: 20 mL

## 2023-02-12 MED ORDER — FLUTICASONE PROPIONATE 50 MCG/ACT NA SUSP
1.0000 | Freq: Every day | NASAL | Status: DC
  Administered 2023-02-13 – 2023-02-21 (×6): 1 via NASAL
  Filled 2023-02-12: qty 16

## 2023-02-12 MED ORDER — SODIUM CHLORIDE (PF) 0.9 % IJ SOLN
INTRAMUSCULAR | Status: AC
  Filled 2023-02-12: qty 50

## 2023-02-12 MED ORDER — TRAMADOL HCL 50 MG PO TABS
ORAL_TABLET | ORAL | Status: AC
  Filled 2023-02-12: qty 1

## 2023-02-12 MED ORDER — PROPOFOL 10 MG/ML IV BOLUS
INTRAVENOUS | Status: DC | PRN
Start: 2023-02-12 — End: 2023-02-12
  Administered 2023-02-12: 70 mg via INTRAVENOUS

## 2023-02-12 MED ORDER — ROCURONIUM BROMIDE 100 MG/10ML IV SOLN
INTRAVENOUS | Status: DC | PRN
  Administered 2023-02-12: 50 mg via INTRAVENOUS
  Administered 2023-02-12: 20 mg via INTRAVENOUS

## 2023-02-12 MED ORDER — BUDESONIDE 0.5 MG/2ML IN SUSP
0.5000 mg | Freq: Two times a day (BID) | RESPIRATORY_TRACT | Status: DC
  Administered 2023-02-12 – 2023-02-13 (×2): 0.5 mg via RESPIRATORY_TRACT
  Filled 2023-02-12 (×2): qty 2

## 2023-02-12 MED ORDER — CEFAZOLIN SODIUM-DEXTROSE 2-4 GM/100ML-% IV SOLN
INTRAVENOUS | Status: AC
  Filled 2023-02-12: qty 100

## 2023-02-12 MED ORDER — PHENYLEPHRINE HCL-NACL 20-0.9 MG/250ML-% IV SOLN
INTRAVENOUS | Status: AC
  Filled 2023-02-12: qty 250

## 2023-02-12 MED ORDER — POLYETHYLENE GLYCOL 3350 17 G PO PACK
17.0000 g | PACK | Freq: Every day | ORAL | Status: DC
  Administered 2023-02-13 – 2023-02-21 (×9): 17 g via ORAL
  Filled 2023-02-12 (×10): qty 1

## 2023-02-12 MED ORDER — DEXMEDETOMIDINE HCL IN NACL 200 MCG/50ML IV SOLN
INTRAVENOUS | Status: DC | PRN
Start: 2023-02-12 — End: 2023-02-12
  Administered 2023-02-12: 8 ug via INTRAVENOUS

## 2023-02-12 MED ORDER — SODIUM CHLORIDE 0.9 % IV SOLN: INTRAVENOUS | Status: DC

## 2023-02-12 MED ORDER — FENTANYL CITRATE (PF) 100 MCG/2ML IJ SOLN
INTRAMUSCULAR | Status: AC
  Filled 2023-02-12: qty 2

## 2023-02-12 MED ORDER — ROFLUMILAST 500 MCG PO TABS
500.0000 ug | ORAL_TABLET | Freq: Every day | ORAL | Status: DC
  Administered 2023-02-13 – 2023-02-21 (×9): 500 ug via ORAL
  Filled 2023-02-12 (×10): qty 1

## 2023-02-12 MED ORDER — CHLORHEXIDINE GLUCONATE 0.12 % MT SOLN
OROMUCOSAL | Status: AC
  Filled 2023-02-12: qty 15

## 2023-02-12 MED ORDER — EPHEDRINE SULFATE (PRESSORS) 50 MG/ML IJ SOLN
INTRAMUSCULAR | Status: DC | PRN
  Administered 2023-02-12: 5 mg via INTRAVENOUS

## 2023-02-12 MED ORDER — BUPIVACAINE LIPOSOME 1.3 % IJ SUSP
INTRAMUSCULAR | Status: AC
  Filled 2023-02-12: qty 20

## 2023-02-12 MED ORDER — CHLORHEXIDINE GLUCONATE 0.12 % MT SOLN
15.0000 mL | Freq: Once | OROMUCOSAL | Status: AC
  Administered 2023-02-12: 15 mL via OROMUCOSAL

## 2023-02-12 MED ORDER — FENTANYL CITRATE (PF) 100 MCG/2ML IJ SOLN
INTRAMUSCULAR | Status: DC | PRN
  Administered 2023-02-12 (×2): 50 ug via INTRAVENOUS

## 2023-02-12 MED ORDER — ALBUMIN HUMAN 5 % IV SOLN
INTRAVENOUS | Status: DC | PRN
Start: 2023-02-12 — End: 2023-02-12

## 2023-02-12 MED ORDER — MONTELUKAST SODIUM 10 MG PO TABS
10.0000 mg | ORAL_TABLET | Freq: Every day | ORAL | Status: DC
  Administered 2023-02-12 – 2023-02-20 (×8): 10 mg via ORAL
  Filled 2023-02-12 (×8): qty 1

## 2023-02-12 MED ORDER — IPRATROPIUM-ALBUTEROL 0.5-2.5 (3) MG/3ML IN SOLN
RESPIRATORY_TRACT | Status: AC
  Filled 2023-02-12: qty 3

## 2023-02-12 MED ORDER — ACETAMINOPHEN 10 MG/ML IV SOLN
INTRAVENOUS | Status: DC | PRN
  Administered 2023-02-12: 1000 mg via INTRAVENOUS

## 2023-02-12 MED ORDER — AMLODIPINE BESYLATE 10 MG PO TABS
10.0000 mg | ORAL_TABLET | Freq: Every day | ORAL | Status: DC
  Administered 2023-02-12 – 2023-02-20 (×8): 10 mg via ORAL
  Filled 2023-02-12 (×8): qty 1

## 2023-02-12 SURGICAL SUPPLY — 51 items
APL PRP STRL LF DISP 70% ISPRP (MISCELLANEOUS) ×2
CHLORAPREP W/TINT 26 (MISCELLANEOUS) ×2 IMPLANT
DRAPE INCISE IOBAN 66X45 STRL (DRAPES) IMPLANT
DRAPE LAPAROTOMY 100X77 ABD (DRAPES) ×2 IMPLANT
DRAPE LEGGINS SURG 28X43 STRL (DRAPES) ×2 IMPLANT
DRAPE UNDER BUTTOCK W/FLU (DRAPES) ×2 IMPLANT
DRSG TELFA 3X8 NADH STRL (GAUZE/BANDAGES/DRESSINGS) ×2 IMPLANT
ELECT BLADE 6 FLAT ULTRCLN (ELECTRODE) ×2 IMPLANT
ELECT REM PT RETURN 9FT ADLT (ELECTROSURGICAL)
ELECTRODE REM PT RTRN 9FT ADLT (ELECTROSURGICAL) ×2 IMPLANT
GAUZE 4X4 16PLY ~~LOC~~+RFID DBL (SPONGE) ×2 IMPLANT
GLOVE BIO SURGEON STRL SZ 6.5 (GLOVE) ×6 IMPLANT
GLOVE BIOGEL PI IND STRL 6.5 (GLOVE) ×4 IMPLANT
GLOVE BIOGEL PI IND STRL 7.0 (GLOVE) ×4 IMPLANT
GLOVE SURG SYN 6.5 ES PF (GLOVE) ×4 IMPLANT
GLOVE SURG SYN 6.5 PF PI (GLOVE) ×6 IMPLANT
GOWN STRL REUS W/ TWL LRG LVL3 (GOWN DISPOSABLE) ×12 IMPLANT
GOWN STRL REUS W/TWL LRG LVL3 (GOWN DISPOSABLE) ×6
KIT TURNOVER KIT A (KITS) ×2 IMPLANT
LABEL OR SOLS (LABEL) ×2 IMPLANT
LIGASURE IMPACT 36 18CM CVD LR (INSTRUMENTS) ×2 IMPLANT
MANIFOLD NEPTUNE II (INSTRUMENTS) ×2 IMPLANT
NS IRRIG 1000ML POUR BTL (IV SOLUTION) ×2 IMPLANT
PACK BASIN MAJOR ARMC (MISCELLANEOUS) ×2 IMPLANT
PAD PREP OB/GYN DISP 24X41 (PERSONAL CARE ITEMS) ×2 IMPLANT
RELOAD BL CONTOUR (ENDOMECHANICALS) IMPLANT
RELOAD PROXIMATE 75MM BLUE (ENDOMECHANICALS) ×4 IMPLANT
RELOAD STAPLE 40 BLU REG (ENDOMECHANICALS) IMPLANT
RELOAD STAPLE 75 3.8 BLU REG (ENDOMECHANICALS) IMPLANT
SET YANKAUER POOLE SUCT (MISCELLANEOUS) ×2 IMPLANT
SPONGE KITTNER 5P (MISCELLANEOUS) ×2 IMPLANT
SPONGE T-LAP 18X18 ~~LOC~~+RFID (SPONGE) ×6 IMPLANT
STAPLER CIRCULAR MANUAL XL 29 (STAPLE) ×2 IMPLANT
STAPLER CVD CUT BL 40 RELOAD (ENDOMECHANICALS) IMPLANT
STAPLER CVD CUT BLU 40 RELOAD (ENDOMECHANICALS) IMPLANT
STAPLER PROXIMATE 75MM BLUE (STAPLE) IMPLANT
STAPLER SKIN PROX 35W (STAPLE) ×2 IMPLANT
SUT ETH BLK MONO 3 0 FS 1 12/B (SUTURE) ×2 IMPLANT
SUT PDS AB 0 CT1 27 (SUTURE) IMPLANT
SUT PROLENE 0 CT 1 30 (SUTURE) ×8 IMPLANT
SUT SILK 3-0 (SUTURE) ×2 IMPLANT
SUT V-LOC 90 ABS 3-0 VLT V-20 (SUTURE) IMPLANT
SUT VIC AB 2-0 BRD 54 (SUTURE) ×2 IMPLANT
SUT VIC AB 3-0 54X BRD REEL (SUTURE) ×2 IMPLANT
SUT VIC AB 3-0 BRD 54 (SUTURE) ×2
SUT VIC AB 3-0 SH 27 (SUTURE) ×2
SUT VIC AB 3-0 SH 27X BRD (SUTURE) ×4 IMPLANT
SYR BULB IRRIG 60ML STRL (SYRINGE) ×2 IMPLANT
TRAP FLUID SMOKE EVACUATOR (MISCELLANEOUS) ×2 IMPLANT
TRAY FOLEY MTR SLVR 16FR STAT (SET/KITS/TRAYS/PACK) ×2 IMPLANT
WATER STERILE IRR 500ML POUR (IV SOLUTION) ×2 IMPLANT

## 2023-02-12 NOTE — Op Note (Addendum)
Preoperative diagnosis: Ileostomy status   Postoperative diagnosis: Ileostomy status.   Procedure: Small bowel resection with closure of loop ileostomy. Appendectomy  Anesthesia: GETA  Surgeon: Dr. Hazle Quant, MD  Wound Classification: Contaminated  Indications:  Patient is a 77 y.o. male who underwent sigmoid colectomy with proximal diverting loop ileostomy 3 months ago. Contrast studies revealed a securely healed distal anastomosis.  Findings: 1.  Healthy proximal and distal ileum 2.  Adequate hemostasis 3.  Good caliber anastomosis diameter 4.  Appendix adhered with adhesions to the distal ileum, appendectomy indicated to be able to perform small bowel anastomosis.  Description of procedure: The patient was placed in a supine position. The procedure was performed under general anesthesia. The ileostomy bag was removed, the ileostomy site cleaned, then the abdomen was prepped and draped in a sterile manner. A time-out was completed verifying correct patient, procedure, site, positioning, and implant(s) and/or special equipment prior to beginning this procedure. An elliptical skin incision about 2 mm from mucocutaneous junction, around the ileostomy was performed. The incision was deepened in the subcutaneous tissue until the serosa of the emerging bowel appeared. Sharp dissection was continued circumferentially in this plane, dividing the fine adhesions between the bowel and its mesentery and the subcutaneous fat until the fascia was reached, and the peritoneal cavity entered, after which it was feasible to bring the emerging ileal loop through the wound.  The appendix was identified adhered to the distal ileum.  Appendectomy was performed with GIA.  Small bowel resection of the proximal and distal ileum was performed.   The proximal and distal ileum were placed together in a side-to-side fashion.  Enterotomy was performed on both sides and small bowel anastomosis was performed with GIA.   Then, the enterotomy was closed with 3-0 V-Loc.  The site was checked for hemostasis, which was adequate, the loop of ileum was returned back to the abdominal cavity.  Hemostasis was secured, fascial defect was closed using continuous PDS suture, and skin closed in a purse-string fashion leaving a small opening in the center of the wound for drainage.   The patient tolerated the procedure well and was transferred to the postanesthesia care unit in stable condition.   Specimen: Ileostomy                    Appendix  Complications: None  Estimated Blood Loss: 50 mL

## 2023-02-12 NOTE — Consult Note (Signed)
PULMONOLOGY         Date: 02/12/2023,   MRN# [id number] Joe Torres 04-Sep-1945     AdmissionWeight: 64.9 kg                 CurrentWeight: 64.9 kg  Referring provider: Dr. 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  Also has a background history of bladder cancer, BPH, cataracts, diverticulitis status post surgical evaluation and findings of perforated diverticulitis.  Patient had underwent partial colectomy with anastomosis and protective loop ileostomy.  Postoperatively patient was noted to have acute on chronic hypoxemia requiring 6 L/min nasal cannula.  Neri consultation placed for management of advanced COPD with chronic hypoxemia.    PAST MEDICAL HISTORY   Past Medical History:  Diagnosis Date   Arthritis    Asthma    Bladder cancer (HCC)    BPH (benign prostatic hyperplasia)    Cancer (HCC)    Cataracts, bilateral    COPD, severe (HCC)    Diverticulitis of large intestine with perforation without bleeding    Dyspnea    GERD (gastroesophageal reflux disease)    Hyperlipidemia    Hypertension    Obstructive sleep apnea    C-pap with oxygen   Osteoarthritis    Panlobular emphysema (HCC)    Postoperative ileus (HCC)    Right inguinal hernia      SURGICAL HISTORY   Past Surgical History:  Procedure Laterality Date   CATARACT EXTRACTION W/ INTRAOCULAR LENS IMPLANT Right 05/18/2013   CATARACT EXTRACTION W/ INTRAOCULAR LENS IMPLANT Left 07/06/2013   COLONOSCOPY     COLONOSCOPY WITH PROPOFOL N/A 01/16/2021   Procedure: COLONOSCOPY WITH PROPOFOL;  Surgeon: Wyline Mood, MD;  Location: [facility] ENDOSCOPY;  Service: Gastroenterology;  Laterality: N/A;  SPANISH INTERPRETER 9 AM ARRIVAL  REQUEST   ILEOSTOMY N/A 11/03/2022   Procedure: ILEOSTOMY;  Surgeon: Carolan Shiver, MD;  Location: [facility] ORS;  Service: General;  Laterality: N/A;   INGUINAL HERNIA REPAIR Right 01/08/2016   LAPAROTOMY N/A 10/20/2022   Procedure: EXPLORATORY LAPAROTOMY,  SIGMOID COLECTOMY, LOOP ILEOSTOMY;  Surgeon: Carolan Shiver, MD;  Location: [facility] ORS;  Service: General;  Laterality: N/A;   PORTA CATH INSERTION N/A 06/19/2020   Procedure: PORTA CATH INSERTION;  Surgeon: Annice Needy, MD;  Location: [facility] INVASIVE CV LAB;  Service: Cardiovascular;  Laterality: N/A;   TRANSURETHRAL RESECTION OF BLADDER TUMOR WITH GYRUS (TURBT-GYRUS)  07/2020   TRANSURETHRAL RESECTION OF BLADDER TUMOR WITH MITOMYCIN-C N/A 05/22/2020   Procedure: TRANSURETHRAL RESECTION OF BLADDER TUMOR WITH Gemcitabine;  Surgeon: Vanna Scotland, MD;  Location: [facility] ORS;  Service: Urology;  Laterality: N/A;     FAMILY HISTORY   Family History  Problem Relation Age of Onset   Asthma Mother      SOCIAL HISTORY   Social History   Tobacco Use   Smoking status: Former    Current packs/day: 0.00    Average packs/day: 1.5 packs/day for 40.0 years (60.0 ttl pk-yrs)    Types: Cigarettes    Start date: 05/21/1968    Quit date: 05/21/2008    Years since quitting: 14.7    Passive exposure: Past   Smokeless tobacco: Never  Vaping Use   Vaping status: Never Used  Substance Use Topics   Alcohol use: Not Currently   Drug use: Never     MEDICATIONS    Home Medication:    Current Medication:  Current Facility-Administered Medications:    HYDROmorphone (DILAUDID) injection 0.25-0.5 mg, 0.25-0.5 mg, Intravenous, Q5 min PRN, Foye Deer, MD, 0.5 mg at 02/12/23 1050   lactated ringers infusion, , Intravenous, Continuous, Corinda Gubler, MD, Stopped at 02/12/23 1021   traMADol (ULTRAM) tablet 50 mg, 50 mg, Oral, Q6H PRN, Carolan Shiver, MD    ALLERGIES   Patient has no known allergies.     REVIEW OF  SYSTEMS    Review of Systems:  Gen:  Denies  fever, sweats, chills weigh loss  HEENT: Denies blurred vision, double vision, ear pain, eye pain, hearing loss, nose bleeds, sore throat Cardiac:  No dizziness, chest pain or heaviness, chest tightness,edema Resp:   reports dyspnea chronically  Gi: Denies swallowing difficulty, stomach pain, nausea or vomiting, diarrhea, constipation, bowel incontinence Gu:  Denies bladder incontinence, burning urine Ext:   Denies Joint pain, stiffness or swelling Skin: Denies  skin rash, easy bruising or bleeding or hives Endoc:  Denies polyuria, polydipsia , polyphagia or weight change Psych:   Denies depression, insomnia or hallucinations   Other:  All other systems negative   VS: BP 116/73 (BP Location: Left Arm)   Pulse 96   Temp 97.7 F (36.5 C)   Resp 13   Ht 5\' 7"  (1.702 m)   Wt 64.9 kg   SpO2 100%   BMI 22.40 kg/m      PHYSICAL EXAM    GENERAL:NAD, no fevers, chills, no weakness no fatigue HEAD: Normocephalic, atraumatic.  EYES: Pupils equal, round, reactive to light. Extraocular muscles intact. No scleral icterus.  MOUTH: Moist mucosal membrane. Dentition intact. No abscess noted.  EAR, NOSE, THROAT: Clear without exudates. No external lesions.  NECK: Supple. No thyromegaly. No nodules. No JVD.  PULMONARY: decreased breath sounds with mild rhonchi worse at bases bilaterally.  CARDIOVASCULAR: S1 and S2. Regular rate and rhythm. No murmurs, rubs, or gallops. No edema. Pedal pulses 2+ bilaterally.  GASTROINTESTINAL: Soft, nontender, nondistended. No masses. Positive bowel sounds. No hepatosplenomegaly.  MUSCULOSKELETAL: No swelling, clubbing, or edema. Range of motion full in all extremities.  NEUROLOGIC: Cranial nerves II through XII are intact. No gross focal neurological deficits. Sensation intact. Reflexes intact.  SKIN: No ulceration, lesions, rashes, or cyanosis. Skin warm and dry. Turgor intact.  PSYCHIATRIC: Mood, affect  within normal limits. The patient is awake, alert and oriented x 3. Insight, judgment intact.       IMAGING       Narrative & Impression  CLINICAL DATA:  Abdominal pain, acute, nonlocalized; Pulmonary embolism (PE) suspected, high prob. History of bladder cancer. * Tracking Code: BO *   EXAM: CT ANGIOGRAPHY CHEST   CT ABDOMEN AND PELVIS WITH CONTRAST   TECHNIQUE: Multidetector CT imaging of the chest was performed using the standard protocol during bolus administration of intravenous contrast. Multiplanar CT image reconstructions and MIPs were obtained to evaluate the vascular anatomy. Multidetector CT imaging of the abdomen and pelvis was performed using the standard protocol during bolus administration of intravenous contrast.   RADIATION DOSE REDUCTION: This exam was performed according to the departmental dose-optimization program which includes automated exposure control, adjustment of the mA and/or kV according to patient size and/or use of iterative reconstruction technique.   CONTRAST:  75mL OMNIPAQUE IOHEXOL 350 MG/ML SOLN   COMPARISON:  Chest radiograph from  earlier today. 04/24/2022 CT chest, abdomen and pelvis.   FINDINGS: CTA CHEST FINDINGS   Cardiovascular: The study is high quality for the evaluation of pulmonary embolism. There are no filling defects in the central, lobar, segmental or subsegmental pulmonary artery branches to suggest acute pulmonary embolism. Atherosclerotic nonaneurysmal thoracic aorta. Normal caliber pulmonary arteries. Normal heart size. No significant pericardial fluid/thickening. Three-vessel coronary atherosclerosis. Right internal jugular Port-A-Cath terminates in the lower third of the SVC.   Mediastinum/Nodes: No significant thyroid nodules. Unremarkable esophagus. No pathologically enlarged axillary, mediastinal or hilar lymph nodes.   Lungs/Pleura: No pneumothorax. No pleural effusion. Severe centrilobular emphysema  with mild diffuse bronchial wall thickening. No acute consolidative airspace disease or lung masses. Scattered tiny calcified granulomas in the peripheral upper lobes are unchanged. No significant pulmonary nodules.   Musculoskeletal: No aggressive appearing focal osseous lesions. Mild thoracic spondylosis.   Review of the MIP images confirms the above findings.   CT ABDOMEN and PELVIS FINDINGS   Motion degraded scan, limiting assessment.   Hepatobiliary: For normal liver size. Subcentimeter hypodense posteroinferior right liver lesion, too small to characterize, unchanged, presumably benign. No new liver lesions. Normal gallbladder with no radiopaque cholelithiasis. No biliary ductal dilatation.   Pancreas: Normal, with no mass or duct dilation.   Spleen: Normal size. No mass.   Adrenals/Urinary Tract: Normal adrenals. Simple exophytic 1.9 cm renal cyst in the anterior interpolar right kidney with numerous additional subcentimeter hypodense bilateral renal cortical lesions that are too small to characterize, for which no follow-up imaging is recommended. No hydronephrosis. Normal bladder.   Stomach/Bowel: Normal non-distended stomach. Normal caliber small bowel with no small bowel wall thickening. Normal appendix. Marked left colonic diverticulosis, most prominent in the sigmoid colon. There is focal pericolonic fat stranding and ill-defined free fluid with scattered free air in the region of the mid to distal sigmoid colon in the right pelvis (series 4/image 49), suggesting perforated sigmoid diverticulitis. No discrete measurable/drainable pericolonic abscess.   Vascular/Lymphatic: Atherosclerotic nonaneurysmal abdominal aorta. Patent portal, splenic, hepatic and renal veins. No pathologically enlarged lymph nodes in the abdomen or pelvis.   Reproductive: Mild prostatomegaly.   Other: Scattered small to moderate free air throughout the anterior upper peritoneal cavity  (series 4/image 9). No ascites. Tiny chronic fat containing umbilical hernia.   Musculoskeletal: No aggressive appearing focal osseous lesions. Moderate thoracolumbar spondylosis.   Review of the MIP images confirms the above findings.   IMPRESSION: 1. No pulmonary embolism. 2. Small to moderate pneumoperitoneum, suspected to be due to perforated sigmoid diverticulitis. No abscess. 3. No evidence of metastatic disease in the chest, abdomen or pelvis. 4. Three-vessel coronary atherosclerosis. 5. Severe centrilobular emphysema with mild diffuse bronchial wall thickening, suggesting COPD. 6. Mild prostatomegaly. 7. Aortic Atherosclerosis (ICD10-I70.0) and Emphysema (ICD10-J43.9).   Critical Value/emergent results were called by telephone at the time of interpretation on 10/20/2022 at 10:36 am to provider [facility] , who verbally acknowledged these results.     Electronically Signed   By: Delbert Phenix M.D.   On: 10/20/2022 10:38        ASSESSMENT/PLAN   Acute on chronic hypoxemic respiratory failure -most likely due to transient post anesthesia hypoventilation with background of centrilobular emphysmea -patient is being weaned on O2 requirement -will deliver incentive spiromtry to patient -duoneb nebulizer therapy x Q6h prn - no need for steroids at this time -will obtain CXR to evaluate for atelectasis vs infiltrates -no signs of COVID19 -spO2 goal >88% with goal 2L/min as per  home setting -PT/OT when cleared by surgery -Ventolin MDI QID PRN while hospitlized         Thank you for allowing me to participate in the care of this patient.   Patient/Family are satisfied with care plan and all questions have been answered.    Provider disclosure: Patient with at least one acute or chronic illness or injury that poses a threat to life or bodily function and is being managed actively during this encounter.  All of the below services have been performed independently  by signing provider:  review of prior documentation from internal and or external health records.  Review of previous and current lab results.  Interview and comprehensive assessment during patient visit today. Review of current and previous chest radiographs/CT scans. Discussion of management and test interpretation with health care team and patient/family.   This document was prepared using Dragon voice recognition software and may include unintentional dictation errors.     Vida Rigger, M.D.  Division of Pulmonary & Critical Care Medicine

## 2023-02-12 NOTE — Transfer of Care (Signed)
Immediate Anesthesia Transfer of Care Note  Patient: Joe Torres  Procedure(s) Performed: ILEOSTOMY TAKEDOWN (Abdomen) APPENDECTOMY  Patient Location: PACU  Anesthesia Type:General  Level of Consciousness: awake, drowsy, and patient cooperative  Airway & Oxygen Therapy: Patient Spontanous Breathing and Patient connected to face mask oxygen  Post-op Assessment: Report given to RN and Post -op Vital signs reviewed and stable  Post vital signs: Reviewed and stable  Last Vitals:  Vitals Value Taken Time  BP 116/73 02/12/23 1030  Temp    Pulse 93 02/12/23 1033  Resp 16 02/12/23 1033  SpO2 100 % 02/12/23 1033  Vitals shown include unfiled device data.  Last Pain:  Vitals:   02/12/23 0733  TempSrc: Oral  PainSc: 0-No pain         Complications:  Encounter Notable Events  Notable Event Outcome Phase Comment  Difficult to intubate - expected  Intraprocedure Filed from anesthesia note documentation.

## 2023-02-12 NOTE — H&P (Signed)
History of Present Illness Joe Torres is a 77 y.o. male with ileostomy in place.  Patient with history of perforated diverticulitis.  She underwent partial colectomy with anastomosis and protective loop ileostomy.  He has been recovering adequately.  He had a Gastrografin enema showing adequate anastomosis without narrowing or leak.  Patient had history of severe COPD.  He was optimized by his pulmonologist.  Past Medical History Past Medical History:  Diagnosis Date   Arthritis    Asthma    Bladder cancer (HCC)    BPH (benign prostatic hyperplasia)    Cancer (HCC)    Cataracts, bilateral    COPD, severe (HCC)    Diverticulitis of large intestine with perforation without bleeding    Dyspnea    GERD (gastroesophageal reflux disease)    Hyperlipidemia    Hypertension    Obstructive sleep apnea    C-pap with oxygen   Osteoarthritis    Panlobular emphysema (HCC)    Postoperative ileus (HCC)    Right inguinal hernia        Past Surgical History:  Procedure Laterality Date   CATARACT EXTRACTION W/ INTRAOCULAR LENS IMPLANT Right 05/18/2013   CATARACT EXTRACTION W/ INTRAOCULAR LENS IMPLANT Left 07/06/2013   COLONOSCOPY     COLONOSCOPY WITH PROPOFOL N/A 01/16/2021   Procedure: COLONOSCOPY WITH PROPOFOL;  Surgeon: Wyline Mood, MD;  Location: [facility] ENDOSCOPY;  Service: Gastroenterology;  Laterality: N/A;  SPANISH INTERPRETER 9 AM ARRIVAL REQUEST   ILEOSTOMY N/A 11/03/2022   Procedure: ILEOSTOMY;  Surgeon: Carolan Shiver, MD;  Location: [facility] ORS;  Service: General;  Laterality: N/A;   INGUINAL HERNIA REPAIR Right 01/08/2016   LAPAROTOMY N/A 10/20/2022   Procedure: EXPLORATORY LAPAROTOMY,  SIGMOID COLECTOMY, LOOP ILEOSTOMY;  Surgeon: Carolan Shiver, MD;  Location: [facility] ORS;  Service: General;  Laterality: N/A;   PORTA CATH INSERTION N/A 06/19/2020   Procedure: PORTA CATH INSERTION;  Surgeon: Annice Needy, MD;  Location: [facility] INVASIVE CV LAB;  Service: Cardiovascular;   Laterality: N/A;   TRANSURETHRAL RESECTION OF BLADDER TUMOR WITH GYRUS (TURBT-GYRUS)  07/2020   TRANSURETHRAL RESECTION OF BLADDER TUMOR WITH MITOMYCIN-C N/A 05/22/2020   Procedure: TRANSURETHRAL RESECTION OF BLADDER TUMOR WITH Gemcitabine;  Surgeon: Vanna Scotland, MD;  Location: [facility] ORS;  Service: Urology;  Laterality: N/A;    No Known Allergies  Current Facility-Administered Medications  Medication Dose Route Frequency Provider Last Rate Last Admin   ceFAZolin (ANCEF) IVPB 2g/100 mL premix  2 g Intravenous On Call to OR Carolan Shiver, MD       chlorhexidine (PERIDEX) 0.12 % solution 15 mL  15 mL Mouth/Throat Once Corinda Gubler, MD       Or   Oral care mouth rinse  15 mL Mouth Rinse Once Corinda Gubler, MD       lactated ringers infusion   Intravenous Continuous Corinda Gubler, MD        Family History Family History  Problem Relation Age of Onset   Asthma Mother        Social History Social History   Tobacco Use   Smoking status: Former    Current packs/day: 0.00    Average packs/day: 1.5 packs/day for 40.0 years (60.0 ttl pk-yrs)    Types: Cigarettes    Start date: 05/21/1968    Quit date: 05/21/2008    Years since quitting: 14.7    Passive exposure: Past   Smokeless tobacco: Never  Vaping Use   Vaping status: Never Used  Substance Use Topics   Alcohol  use: Not Currently   Drug use: Never        ROS Full ROS of systems performed and is otherwise negative there than what is stated in the HPI  Physical Exam There were no vitals taken for this visit.  CONSTITUTIONAL: Alert, oriented x 3 EYES: Pupils equal, round, and reactive to light, Sclera non-icteric. EARS, NOSE, MOUTH AND THROAT: The oropharynx is clear. Oral mucosa is pink and moist. Hearing is intact to voice.  NECK: Trachea is midline, and there is no jugular venous distension. Thyroid is without palpable abnormalities. LYMPH NODES:  Lymph nodes in the neck are not enlarged. RESPIRATORY:  Lungs are  clear, and breath sounds are equal bilaterally. Normal respiratory effort without pathologic use of accessory muscles. CARDIOVASCULAR: Heart is regular without murmurs, gallops, or rubs. GI: The abdomen is soft, nontender, and nondistended. There were no palpable masses. There was no hepatosplenomegaly. There were normal bowel sounds. Ileostomy is pink and patent GU: Deferred MUSCULOSKELETAL:  Normal muscle strength and tone in all four extremities.    SKIN: Skin turgor is normal. There are no pathologic skin lesions.  NEUROLOGIC:  Motor and sensation is grossly normal.  Cranial nerves are grossly intact. PSYCH:  Alert and oriented to person, place and time. Affect is normal.  Assessment    Ileostomy in place Severe COPD  Plan    Will proceed with planned ileostomy reversal Will consult pulmonology for management of severe COPD postop.  Carolan Shiver, MD

## 2023-02-12 NOTE — Anesthesia Procedure Notes (Signed)
Procedure Name: Intubation Date/Time: 02/12/2023 8:35 AM  Performed by: Mohammed Kindle, CRNAPre-anesthesia Checklist: Patient identified, Emergency Drugs available, Suction available and Patient being monitored Patient Re-evaluated:Patient Re-evaluated prior to induction Oxygen Delivery Method: Circle system utilized Preoxygenation: Pre-oxygenation with 100% oxygen Induction Type: IV induction Ventilation: Mask ventilation without difficulty Laryngoscope Size: McGraph and 3 Grade View: Grade I Tube type: Oral Number of attempts: 1 Airway Equipment and Method: Stylet Placement Confirmation: ETT inserted through vocal cords under direct vision, positive ETCO2, breath sounds checked- equal and bilateral and CO2 detector Secured at: 21 cm Tube secured with: Tape Dental Injury: Teeth and Oropharynx as per pre-operative assessment  Difficulty Due To: Difficulty was anticipated and Difficult Airway- due to dentition

## 2023-02-12 NOTE — Anesthesia Preprocedure Evaluation (Addendum)
Anesthesia Evaluation  Patient identified by MRN, date of birth, ID band Patient awake    Reviewed: Allergy & Precautions, NPO status , Patient's Chart, lab work & pertinent test results  History of Anesthesia Complications Negative for: history of anesthetic complications  Airway Mallampati: III  TM Distance: <3 FB Neck ROM: full    Dental  (+) Missing, Poor Dentition   Pulmonary shortness of breath, asthma , sleep apnea , COPD,  COPD inhaler and oxygen dependent, former smoker Baseline home O2 use around 2-3L   + rhonchi  + decreased breath sounds  rales    Cardiovascular Exercise Tolerance: Poor hypertension, (-) angina Normal cardiovascular exam  MPS 5/22: - Normal myocardial perfusion study  - No evidence of any significant ischemia or scar  - Left ventricular systolic function is hyperdynamic. Post stress the  ejection fraction is > 60%.  - Coronary calcifications are noted  - Sensitivity and specificity of this test are reduced by the noted motion   ECHO 5/22: Summary   1. Technically difficult study.    2. The left ventricle is normal in size with normal wall thickness.    3. The left ventricular systolic function is normal, LVEF is visually  estimated at > 55%.    4. The right ventricle is mildly dilated in size, with normal systolic  function.   5. There is a small, anterior pericardial effusion.     Neuro/Psych  Neuromuscular disease  negative psych ROS   GI/Hepatic Neg liver ROS,GERD  Controlled,,  Endo/Other  negative endocrine ROS    Renal/GU      Musculoskeletal   Abdominal   Peds  Hematology negative hematology ROS (+)   Anesthesia Other Findings Pt is s/p partial colectomy with anastomosis and protective loop ileostomy.  Past Medical History: No date: Arthritis No date: Asthma No date: Bladder cancer (HCC) No date: Cancer (HCC) No date: COPD (chronic obstructive pulmonary disease)  (HCC) No date: Dyspnea No date: GERD (gastroesophageal reflux disease) No date: Hypertension  Past Surgical History: No date: COLONOSCOPY 01/16/2021: COLONOSCOPY WITH PROPOFOL; N/A     Comment:  Procedure: COLONOSCOPY WITH PROPOFOL;  Surgeon: Wyline Mood, MD;  Location: [facility] ENDOSCOPY;  Service:               Gastroenterology;  Laterality: N/A;  SPANISH               INTERPRETER 9 AM ARRIVAL REQUEST 2017: HERNIA REPAIR 10/20/2022: LAPAROTOMY; N/A     Comment:  Procedure: EXPLORATORY LAPAROTOMY,  SIGMOID COLECTOMY,               LOOP ILEOSTOMY;  Surgeon: Carolan Shiver, MD;                Location: [facility] ORS;  Service: General;  Laterality: N/A; 06/19/2020: PORTA CATH INSERTION; N/A     Comment:  Procedure: PORTA CATH INSERTION;  Surgeon: Annice Needy,              MD;  Location: [facility] INVASIVE CV LAB;  Service:               Cardiovascular;  Laterality: N/A; 07/2020: TRANSURETHRAL RESECTION OF BLADDER TUMOR WITH GYRUS (TURBT- GYRUS) 05/22/2020: TRANSURETHRAL RESECTION OF BLADDER TUMOR WITH MITOMYCIN- C; N/A     Comment:  Procedure: TRANSURETHRAL RESECTION OF BLADDER TUMOR WITH  Gemcitabine;  Surgeon: Vanna Scotland, MD;  Location:               [facility] ORS;  Service: Urology;  Laterality: N/A;  BMI    Body Mass Index: 22.72 kg/m      Reproductive/Obstetrics negative OB ROS                             Anesthesia Physical Anesthesia Plan  ASA: 3  Anesthesia Plan: General ETT   Post-op Pain Management: Ofirmev IV (intra-op)*, Regional block* and Ketamine IV*   Induction: Intravenous  PONV Risk Score and Plan: 2 and Ondansetron, Dexamethasone, Midazolam and Treatment may vary due to age or medical condition  Airway Management Planned: Oral ETT  Additional Equipment:   Intra-op Plan:   Post-operative Plan: Extubation in OR and Possible Post-op intubation/ventilation  Informed Consent: I have reviewed the patients  History and Physical, chart, labs and discussed the procedure including the risks, benefits and alternatives for the proposed anesthesia with the patient or authorized representative who has indicated his/her understanding and acceptance.     Dental Advisory Given and Interpreter used for interveiw  Plan Discussed with: Anesthesiologist, CRNA and Surgeon  Anesthesia Plan Comments: (Patient and son consented for risks of anesthesia including but not limited to:  - adverse reactions to medications - damage to eyes, teeth, lips or other oral mucosa - nerve damage due to positioning  - sore throat or hoarseness - Damage to heart, brain, nerves, lungs, other parts of body or loss of life  They voiced understanding.)       Anesthesia Quick Evaluation

## 2023-02-12 NOTE — Anesthesia Postprocedure Evaluation (Signed)
Anesthesia Post Note  Patient: Joe Torres  Procedure(s) Performed: ILEOSTOMY TAKEDOWN (Abdomen) APPENDECTOMY  Patient location during evaluation: Endoscopy Anesthesia Type: General Level of consciousness: awake and alert Pain management: pain level controlled Vital Signs Assessment: post-procedure vital signs reviewed and stable Respiratory status: spontaneous breathing, nonlabored ventilation and respiratory function stable Cardiovascular status: blood pressure returned to baseline and stable Postop Assessment: no apparent nausea or vomiting Anesthetic complications: yes   Encounter Notable Events  Notable Event Outcome Phase Comment  Difficult to intubate - expected  Intraprocedure Filed from anesthesia note documentation.     Last Vitals:  Vitals:   02/12/23 1215 02/12/23 1309  BP: 120/81 123/80  Pulse: 100 97  Resp: 14 14  Temp:  36.8 C  SpO2: 96% 99%    Last Pain:  Vitals:   02/12/23 1309  TempSrc: Oral  PainSc: 3                  Foye Deer

## 2023-02-13 ENCOUNTER — Encounter: Payer: Self-pay | Admitting: General Surgery

## 2023-02-13 LAB — MAGNESIUM: Magnesium: 2.1 mg/dL (ref 1.7–2.4)

## 2023-02-13 LAB — CBC
HCT: 38.2 % — ABNORMAL LOW (ref 39.0–52.0)
Hemoglobin: 12.7 g/dL — ABNORMAL LOW (ref 13.0–17.0)
MCH: 30.2 pg (ref 26.0–34.0)
MCHC: 33.2 g/dL (ref 30.0–36.0)
MCV: 90.7 fL (ref 80.0–100.0)
Platelets: 233 10*3/uL (ref 150–400)
RBC: 4.21 MIL/uL — ABNORMAL LOW (ref 4.22–5.81)
RDW: 13.7 % (ref 11.5–15.5)
WBC: 15 10*3/uL — ABNORMAL HIGH (ref 4.0–10.5)
nRBC: 0 % (ref 0.0–0.2)

## 2023-02-13 LAB — BASIC METABOLIC PANEL
Anion gap: 8 (ref 5–15)
BUN: 12 mg/dL (ref 8–23)
CO2: 28 mmol/L (ref 22–32)
Calcium: 8.7 mg/dL — ABNORMAL LOW (ref 8.9–10.3)
Chloride: 103 mmol/L (ref 98–111)
Creatinine, Ser: 0.63 mg/dL (ref 0.61–1.24)
GFR, Estimated: >60 mL/min (ref >60)
Glucose, Bld: 119 mg/dL — ABNORMAL HIGH (ref 70–99)
Potassium: 3.8 mmol/L (ref 3.5–5.1)
Sodium: 139 mmol/L (ref 135–145)

## 2023-02-13 LAB — PHOSPHORUS: Phosphorus: 3.5 mg/dL (ref 2.5–4.6)

## 2023-02-13 NOTE — Plan of Care (Signed)

## 2023-02-13 NOTE — Progress Notes (Signed)
PULMONOLOGY         Date: 02/13/2023,   MRN# [id number] Joe Torres Sep 29, 1945     AdmissionWeight: 64.9 kg                 CurrentWeight: 64.9 kg  Referring provider: Dr. 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  Also has a background history of bladder cancer, BPH, cataracts, diverticulitis status post surgical evaluation and findings of perforated diverticulitis.  Patient had underwent partial colectomy with anastomosis and protective loop ileostomy.  Postoperatively patient was noted to have acute on chronic hypoxemia requiring 6 L/min nasal cannula.  Neri consultation placed for management of advanced COPD with chronic hypoxemia.   02/13/23- patient with no acute events overnight.  Wife at bedside.  He is on home setting of O2 requirement.  He is smiling during interview.  He had CPAP overnight without issues. He is not coughing. We dicussed follow up outpatient for continuity of care to optimize his COPD, chronic hypoxemia and OSA.  Patient is established with [facility] pulmonology and agrees to follow up with his previous lung team.  We have provided our contact information in case his pulmonogists wish to review his care while inpatient. Mr Grube is cleared for dc home from pulmonary perspective.  Upon Dc he does not require prednisone or pulmicort or brovana or aformeterol.  He should have Daliresp daily, Spiriva once daily.  PAST MEDICAL HISTORY   Past Medical History:  Diagnosis Date   Arthritis    Asthma    Bladder cancer (HCC)    BPH (benign prostatic hyperplasia)    Cancer (HCC)    Cataracts, bilateral    COPD, severe (HCC)    Diverticulitis of large intestine with perforation  without bleeding    Dyspnea    GERD (gastroesophageal reflux disease)    Hyperlipidemia    Hypertension    Obstructive sleep apnea    C-pap with oxygen   Osteoarthritis    Panlobular emphysema (HCC)    Postoperative ileus (HCC)    Right inguinal hernia      SURGICAL HISTORY   Past Surgical History:  Procedure Laterality Date   APPENDECTOMY  02/12/2023   Procedure: APPENDECTOMY;  Surgeon: Carolan Shiver, MD;  Location: [facility] ORS;  Service: General;;   CATARACT EXTRACTION W/ INTRAOCULAR LENS IMPLANT Right 05/18/2013   CATARACT EXTRACTION W/ INTRAOCULAR LENS IMPLANT Left 07/06/2013   COLONOSCOPY     COLONOSCOPY WITH PROPOFOL N/A 01/16/2021   Procedure: COLONOSCOPY WITH PROPOFOL;  Surgeon: Wyline Mood, MD;  Location: [facility] ENDOSCOPY;  Service: Gastroenterology;  Laterality: N/A;  SPANISH INTERPRETER 9 AM ARRIVAL REQUEST   ILEOSTOMY N/A 11/03/2022   Procedure: ILEOSTOMY;  Surgeon: Carolan Shiver, MD;  Location: [facility] ORS;  Service: General;  Laterality: N/A;   ILEOSTOMY CLOSURE N/A 02/12/2023   Procedure: ILEOSTOMY TAKEDOWN;  Surgeon: Carolan Shiver, MD;  Location: [facility] ORS;  Service: General;  Laterality: N/A;   INGUINAL HERNIA REPAIR Right 01/08/2016   LAPAROTOMY N/A 10/20/2022   Procedure: EXPLORATORY LAPAROTOMY,  SIGMOID COLECTOMY, LOOP ILEOSTOMY;  Surgeon: Carolan Shiver, MD;  Location: [facility] ORS;  Service: General;  Laterality: N/A;   PORTA CATH INSERTION N/A 06/19/2020   Procedure: Shelda Pal  CATH INSERTION;  Surgeon: Annice Needy, MD;  Location: [facility] INVASIVE CV LAB;  Service: Cardiovascular;  Laterality: N/A;   TRANSURETHRAL RESECTION OF BLADDER TUMOR WITH GYRUS (TURBT-GYRUS)  07/2020   TRANSURETHRAL RESECTION OF BLADDER TUMOR WITH MITOMYCIN-C N/A 05/22/2020   Procedure: TRANSURETHRAL RESECTION OF BLADDER TUMOR WITH Gemcitabine;  Surgeon: Vanna Scotland, MD;  Location: [facility] ORS;  Service: Urology;  Laterality: N/A;     FAMILY HISTORY   Family History   Problem Relation Age of Onset   Asthma Mother      SOCIAL HISTORY   Social History   Tobacco Use   Smoking status: Former    Current packs/day: 0.00    Average packs/day: 1.5 packs/day for 40.0 years (60.0 ttl pk-yrs)    Types: Cigarettes    Start date: 05/21/1968    Quit date: 05/21/2008    Years since quitting: 14.7    Passive exposure: Past   Smokeless tobacco: Never  Vaping Use   Vaping status: Never Used  Substance Use Topics   Alcohol use: Not Currently   Drug use: Never     MEDICATIONS    Home Medication:    Current Medication:  Current Facility-Administered Medications:    0.9 %  sodium chloride infusion, , Intravenous, Continuous, Cintron-Diaz, Edgardo, MD, Last Rate: 50 mL/hr at 02/12/23 1327, New Bag at 02/12/23 1327   albuterol (PROVENTIL) (2.5 MG/3ML) 0.083% nebulizer solution 2.5 mg, 2.5 mg, Nebulization, Q4H PRN, Hazle Quant, Edgardo, MD   amLODipine (NORVASC) tablet 10 mg, 10 mg, Oral, QHS, Cintron-Diaz, Edgardo, MD, 10 mg at 02/12/23 2101   arformoterol (BROVANA) nebulizer solution 15 mcg, 15 mcg, Nebulization, BID, Cintron-Diaz, Edgardo, MD, 15 mcg at 02/13/23 0753   budesonide (PULMICORT) nebulizer solution 0.5 mg, 0.5 mg, Nebulization, BID, Cintron-Diaz, Edgardo, MD, 0.5 mg at 02/13/23 0753   enoxaparin (LOVENOX) injection 40 mg, 40 mg, Subcutaneous, Q24H, Cintron-Diaz, Edgardo, MD, 40 mg at 02/13/23 0755   fluticasone (FLONASE) 50 MCG/ACT nasal spray 1 spray, 1 spray, Each Nare, Daily, Cintron-Diaz, Edgardo, MD, 1 spray at 02/13/23 0756   influenza vaccine adjuvanted (FLUAD) injection 0.5 mL, 0.5 mL, Intramuscular, Tomorrow-1000, Cintron-Diaz, Edgardo, MD   loratadine (CLARITIN) tablet 10 mg, 10 mg, Oral, Daily, Cintron-Diaz, Edgardo, MD, 10 mg at 02/13/23 0754   losartan (COZAAR) tablet 25 mg, 25 mg, Oral, Daily, Cintron-Diaz, Edgardo, MD, 25 mg at 02/13/23 0753   montelukast (SINGULAIR) tablet 10 mg, 10 mg, Oral, QHS, Cintron-Diaz, Edgardo, MD, 10 mg  at 02/12/23 2101   morphine (PF) 4 MG/ML injection 4 mg, 4 mg, Intravenous, Q3H PRN, Carolan Shiver, MD, 4 mg at 02/13/23 0743   pantoprazole (PROTONIX) EC tablet 40 mg, 40 mg, Oral, Daily, Cintron-Diaz, Edgardo, MD, 40 mg at 02/13/23 0754   polyethylene glycol (MIRALAX / GLYCOLAX) packet 17 g, 17 g, Oral, Daily, Cintron-Diaz, Edgardo, MD, 17 g at 02/13/23 0755   potassium chloride SA (KLOR-CON M) CR tablet 20 mEq, 20 mEq, Oral, BID, Cintron-Diaz, Edgardo, MD, 20 mEq at 02/13/23 0753   predniSONE (DELTASONE) tablet 10 mg, 10 mg, Oral, Q breakfast, Cintron-Diaz, Edgardo, MD, 10 mg at 02/13/23 0754   roflumilast (DALIRESP) tablet 500 mcg, 500 mcg, Oral, Daily, Cintron-Diaz, Edgardo, MD, 500 mcg at 02/13/23 0800   tamsulosin (FLOMAX) capsule 0.4 mg, 0.4 mg, Oral, Daily, Cintron-Diaz, Edgardo, MD, 0.4 mg at 02/13/23 0753   tiotropium (SPIRIVA) inhalation capsule ([facility] use ONLY) 18 mcg, 18 mcg, Inhalation, Daily, Carolan Shiver, MD, 18 mcg at 02/13/23 0755   traMADol (ULTRAM) tablet  50 mg, 50 mg, Oral, Q6H PRN, Carolan Shiver, MD, 50 mg at 02/12/23 2101    ALLERGIES   Patient has no known allergies.     REVIEW OF SYSTEMS    Review of Systems:  Gen:  Denies  fever, sweats, chills weigh loss  HEENT: Denies blurred vision, double vision, ear pain, eye pain, hearing loss, nose bleeds, sore throat Cardiac:  No dizziness, chest pain or heaviness, chest tightness,edema Resp:   reports dyspnea chronically  Gi: Denies swallowing difficulty, stomach pain, nausea or vomiting, diarrhea, constipation, bowel incontinence Gu:  Denies bladder incontinence, burning urine Ext:   Denies Joint pain, stiffness or swelling Skin: Denies  skin rash, easy bruising or bleeding or hives Endoc:  Denies polyuria, polydipsia , polyphagia or weight change Psych:   Denies depression, insomnia or hallucinations   Other:  All other systems negative   VS: BP 114/76 (BP Location: Left Arm)    Pulse 100   Temp 98.5 F (36.9 C) (Oral)   Resp 18   Ht 5\' 7"  (1.702 m)   Wt 64.9 kg   SpO2 97%   BMI 22.40 kg/m      PHYSICAL EXAM    GENERAL:NAD, no fevers, chills, no weakness no fatigue HEAD: Normocephalic, atraumatic.  EYES: Pupils equal, round, reactive to light. Extraocular muscles intact. No scleral icterus.  MOUTH: Moist mucosal membrane. Dentition intact. No abscess noted.  EAR, NOSE, THROAT: Clear without exudates. No external lesions.  NECK: Supple. No thyromegaly. No nodules. No JVD.  PULMONARY: decreased breath sounds with mild rhonchi worse at bases bilaterally.  CARDIOVASCULAR: S1 and S2. Regular rate and rhythm. No murmurs, rubs, or gallops. No edema. Pedal pulses 2+ bilaterally.  GASTROINTESTINAL: Soft, nontender, nondistended. No masses. Positive bowel sounds. No hepatosplenomegaly.  MUSCULOSKELETAL: No swelling, clubbing, or edema. Range of motion full in all extremities.  NEUROLOGIC: Cranial nerves II through XII are intact. No gross focal neurological deficits. Sensation intact. Reflexes intact.  SKIN: No ulceration, lesions, rashes, or cyanosis. Skin warm and dry. Turgor intact.  PSYCHIATRIC: Mood, affect within normal limits. The patient is awake, alert and oriented x 3. Insight, judgment intact.       IMAGING       Narrative & Impression  CLINICAL DATA:  Abdominal pain, acute, nonlocalized; Pulmonary embolism (PE) suspected, high prob. History of bladder cancer. * Tracking Code: BO *   EXAM: CT ANGIOGRAPHY CHEST   CT ABDOMEN AND PELVIS WITH CONTRAST   TECHNIQUE: Multidetector CT imaging of the chest was performed using the standard protocol during bolus administration of intravenous contrast. Multiplanar CT image reconstructions and MIPs were obtained to evaluate the vascular anatomy. Multidetector CT imaging of the abdomen and pelvis was performed using the standard protocol during bolus administration of intravenous contrast.    RADIATION DOSE REDUCTION: This exam was performed according to the departmental dose-optimization program which includes automated exposure control, adjustment of the mA and/or kV according to patient size and/or use of iterative reconstruction technique.   CONTRAST:  75mL OMNIPAQUE IOHEXOL 350 MG/ML SOLN   COMPARISON:  Chest radiograph from earlier today. 04/24/2022 CT chest, abdomen and pelvis.   FINDINGS: CTA CHEST FINDINGS   Cardiovascular: The study is high quality for the evaluation of pulmonary embolism. There are no filling defects in the central, lobar, segmental or subsegmental pulmonary artery branches to suggest acute pulmonary embolism. Atherosclerotic nonaneurysmal thoracic aorta. Normal caliber pulmonary arteries. Normal heart size. No significant pericardial fluid/thickening. Three-vessel coronary atherosclerosis. Right internal jugular Port-A-Cath  terminates in the lower third of the SVC.   Mediastinum/Nodes: No significant thyroid nodules. Unremarkable esophagus. No pathologically enlarged axillary, mediastinal or hilar lymph nodes.   Lungs/Pleura: No pneumothorax. No pleural effusion. Severe centrilobular emphysema with mild diffuse bronchial wall thickening. No acute consolidative airspace disease or lung masses. Scattered tiny calcified granulomas in the peripheral upper lobes are unchanged. No significant pulmonary nodules.   Musculoskeletal: No aggressive appearing focal osseous lesions. Mild thoracic spondylosis.   Review of the MIP images confirms the above findings.   CT ABDOMEN and PELVIS FINDINGS   Motion degraded scan, limiting assessment.   Hepatobiliary: For normal liver size. Subcentimeter hypodense posteroinferior right liver lesion, too small to characterize, unchanged, presumably benign. No new liver lesions. Normal gallbladder with no radiopaque cholelithiasis. No biliary ductal dilatation.   Pancreas: Normal, with no mass or duct  dilation.   Spleen: Normal size. No mass.   Adrenals/Urinary Tract: Normal adrenals. Simple exophytic 1.9 cm renal cyst in the anterior interpolar right kidney with numerous additional subcentimeter hypodense bilateral renal cortical lesions that are too small to characterize, for which no follow-up imaging is recommended. No hydronephrosis. Normal bladder.   Stomach/Bowel: Normal non-distended stomach. Normal caliber small bowel with no small bowel wall thickening. Normal appendix. Marked left colonic diverticulosis, most prominent in the sigmoid colon. There is focal pericolonic fat stranding and ill-defined free fluid with scattered free air in the region of the mid to distal sigmoid colon in the right pelvis (series 4/image 49), suggesting perforated sigmoid diverticulitis. No discrete measurable/drainable pericolonic abscess.   Vascular/Lymphatic: Atherosclerotic nonaneurysmal abdominal aorta. Patent portal, splenic, hepatic and renal veins. No pathologically enlarged lymph nodes in the abdomen or pelvis.   Reproductive: Mild prostatomegaly.   Other: Scattered small to moderate free air throughout the anterior upper peritoneal cavity (series 4/image 9). No ascites. Tiny chronic fat containing umbilical hernia.   Musculoskeletal: No aggressive appearing focal osseous lesions. Moderate thoracolumbar spondylosis.   Review of the MIP images confirms the above findings.   IMPRESSION: 1. No pulmonary embolism. 2. Small to moderate pneumoperitoneum, suspected to be due to perforated sigmoid diverticulitis. No abscess. 3. No evidence of metastatic disease in the chest, abdomen or pelvis. 4. Three-vessel coronary atherosclerosis. 5. Severe centrilobular emphysema with mild diffuse bronchial wall thickening, suggesting COPD. 6. Mild prostatomegaly. 7. Aortic Atherosclerosis (ICD10-I70.0) and Emphysema (ICD10-J43.9).   Critical Value/emergent results were called by telephone  at the time of interpretation on 10/20/2022 at 10:36 am to provider [facility] , who verbally acknowledged these results.     Electronically Signed   By: Delbert Phenix M.D.   On: 10/20/2022 10:38        ASSESSMENT/PLAN   Acute on chronic hypoxemic respiratory failure -most likely due to transient post anesthesia hypoventilation with background of centrilobular emphysmea -patient is being weaned on O2 requirement -will deliver incentive spiromtry to patient -duoneb nebulizer therapy x Q6h prn - no need for steroids at this time -will obtain CXR to evaluate for atelectasis vs infiltrates -no signs of COVID19 -spO2 goal >88% with goal 2L/min as per home setting -PT/OT when cleared by surgery -Ventolin MDI QID PRN while hospitlized         Thank you for allowing me to participate in the care of this patient.   Patient/Family are satisfied with care plan and all questions have been answered.    Provider disclosure: Patient with at least one acute or chronic illness or injury that poses a threat to life  or bodily function and is being managed actively during this encounter.  All of the below services have been performed independently by signing provider:  review of prior documentation from internal and or external health records.  Review of previous and current lab results.  Interview and comprehensive assessment during patient visit today. Review of current and previous chest radiographs/CT scans. Discussion of management and test interpretation with health care team and patient/family.   This document was prepared using Dragon voice recognition software and may include unintentional dictation errors.     Vida Rigger, M.D.  Division of Pulmonary & Critical Care Medicine

## 2023-02-13 NOTE — TOC Initial Note (Signed)
Transition of Care [facility]) - Initial/Assessment Note    Patient Details  Name: Joe Torres MRN: [id number] Date of Birth: May 05, 1946  Transition of Care [facility]) CM/SW Contact:    Joe Rua, LCSW Phone Number: 02/13/2023, 2:43 PM  Clinical Narrative:                  Csw notes patient from home with family, hx of [facility] they report they are no longer active with patient , hx of daughter Joe Torres and relatives learning ostomy care to assist.   TOC will follow for needs.   PCP: [facility]   Expected Discharge Plan: Home w Home Health Services Barriers to Discharge: Continued Medical Work up   Patient Goals and CMS Choice Patient states their goals for this hospitalization and ongoing recovery are:: to go home CMS Medicare.gov Compare Post Acute Care list provided to:: Patient Choice offered to / list presented to : Patient      Expected Discharge Plan and Services       Living arrangements for the past 2 months: Single Family Home                                      Prior Living Arrangements/Services Living arrangements for the past 2 months: Single Family Home Lives with:: Relatives                   Activities of Daily Living   ADL Screening (condition at time of admission) Independently performs ADLs?: No Does the patient have a NEW difficulty with bathing/dressing/toileting/self-feeding that is expected to last >3 days?: No Does the patient have a NEW difficulty with getting in/out of bed, walking, or climbing stairs that is expected to last >3 days?: No Does the patient have a NEW difficulty with communication that is expected to last >3 days?: No Is the patient deaf or have difficulty hearing?: Yes Does the patient have difficulty seeing, even when wearing glasses/contacts?: No Does the patient have difficulty concentrating, remembering, or making decisions?: No  Permission Sought/Granted                   Emotional Assessment       Orientation: : Oriented to Self, Oriented to Place, Oriented to  Time, Oriented to Situation Alcohol / Substance Use: Not Applicable Psych Involvement: No (comment)  Admission diagnosis:  Ileostomy status (HCC) [Z93.2] Patient Active Problem List   Diagnosis Date Noted   Ileostomy status (HCC) 02/12/2023   Unilateral recurrent inguinal hernia without obstruction or gangrene 12/24/2022   Ileostomy present (HCC) 12/24/2022   Hypokalemia 11/07/2022   COPD with acute exacerbation (HCC) 11/06/2022   Postoperative ileus (HCC) 10/26/2022   Diverticulitis of colon with perforation 10/20/2022   Acute on chronic respiratory failure with hypoxia (HCC) 10/20/2022   Malignant neoplasm of overlapping sites of bladder (HCC) 06/18/2020   Goals of care, counseling/discussion 06/18/2020   Hyperlipidemia 05/27/2019   Hypertension 05/27/2019   OSA on CPAP 05/27/2019   Primary osteoarthritis of right knee 06/19/2018   Cataract 07/17/2015   Injury of conjunctiva and corneal abrasion of left eye without foreign body 06/28/2015   Hordeolum externum 03/30/2015   Right inguinal hernia 03/20/2015   Environmental allergies 03/17/2015   Skin lesion 11/02/2014   Lumbar strain, initial encounter 06/17/2014   Spondylosis of lumbar region without myelopathy or radiculopathy 06/17/2014   Impaired  glucose tolerance 05/09/2014   Knee pain 05/09/2014   Carpal tunnel syndrome 03/28/2014   BPH (benign prostatic hyperplasia) 05/10/2013   GERD (gastroesophageal reflux disease) 05/10/2013   Chronic pain syndrome 03/10/2013   Asthma-COPD overlap syndrome (HCC) 01/29/2013   Pulmonary nodule seen on imaging study 01/29/2013   Cortical senile cataract 12/04/2011   Tear film insufficiency 12/04/2011   Obstructive chronic bronchitis (HCC) 07/31/2010   Onychomycosis 03/28/2009   Encounter for general adult medical examination without abnormal findings 04/27/2008   Sciatica 12/30/2007    Back pain 12/16/2007   PCP:  [facility]:   [facility] [address], [place] - [address] [place] [address] Phone: [phone] Fax: [phone]  [facility] [place], [place] - [address] [place] [address] Phone: [phone] Fax: [phone]     Social Determinants of Health (SDOH) Social History: SDOH Screenings   Food Insecurity: No Food Insecurity (02/12/2023)  Housing: Low Risk  (02/12/2023)  Transportation Needs: No Transportation Needs (02/12/2023)  Utilities: Not At Risk (02/12/2023)  Financial Resource Strain: Low Risk  (04/22/2019)   [facility]  Tobacco Use: Medium Risk (02/12/2023)  Health Literacy: Medium Risk (08/20/2020)   [facility], [facility]   SDOH Interventions:     Readmission Risk Interventions     No data to display

## 2023-02-13 NOTE — Progress Notes (Signed)
Patient ID: Joe Torres, male   DOB: Jun 13, 1945, 77 y.o.   MRN: [id number]     SURGICAL PROGRESS NOTE   Hospital Day(s): 1.   Interval History: Patient seen and examined, no acute events or new complaints overnight. Patient reports feeling well this morning.  He is having some soreness in the lower abdomen.  No significant pain.  Patient was found ambulating in the room without distress.  Denies passing gas.  Vital signs in last 24 hours: [min-max] current  Temp:  [97.7 F (36.5 C)-98.7 F (37.1 C)] 98.5 F (36.9 C) (10/03 0435) Pulse Rate:  [93-108] 100 (10/03 0435) Resp:  [11-18] 18 (10/03 0435) BP: (92-131)/(62-82) 114/76 (10/03 0435) SpO2:  [90 %-100 %] 90 % (10/03 0435) FiO2 (%):  [32 %] 32 % (10/02 2100)     Height: 5\' 7"  (170.2 cm) Weight: 64.9 kg BMI (Calculated): 22.39   Physical Exam:  Constitutional: alert, cooperative and no distress  Respiratory: breathing non-labored at rest  Cardiovascular: regular rate and sinus rhythm  Gastrointestinal: soft, non-tender, and non-distended.  Right lower quadrant wound with serosanguineous output.  Labs:     Latest Ref Rng & Units 02/13/2023    3:42 AM 02/03/2023    1:37 PM 11/06/2022    4:39 AM  CBC  WBC 4.0 - 10.5 K/uL 15.0  13.2  9.3   Hemoglobin 13.0 - 17.0 g/dL 40.9  81.1  91.4   Hematocrit 39.0 - 52.0 % 38.2  43.3  39.6   Platelets 150 - 400 K/uL 233  324  228       Latest Ref Rng & Units 02/13/2023    3:42 AM 02/03/2023    1:37 PM 11/07/2022    5:53 AM  CMP  Glucose 70 - 99 mg/dL 782  956  213   BUN 8 - 23 mg/dL 12  24  7    Creatinine 0.61 - 1.24 mg/dL 0.86  5.78  4.69   Sodium 135 - 145 mmol/L 139  139  142   Potassium 3.5 - 5.1 mmol/L 3.8  4.1  3.3   Chloride 98 - 111 mmol/L 103  102  99   CO2 22 - 32 mmol/L 28  27  36   Calcium 8.9 - 10.3 mg/dL 8.7  9.4  8.8     Imaging studies: No new pertinent imaging studies   Assessment/Plan:  77 y.o. male with history of ileostomy 1 Day Post-Op s/p ileostomy reversal,  complicated by pertinent comorbidities including severe COPD.  -Patient with stable vital signs.  No fever -Clinically comfortable, expected postoperative pain -Patient is ambulating -No worsening shortness of breath or chest pain -Labs with expected minimal elevation of white blood cell count.  Stable hemoglobin.  No significant electrolyte disturbance -Will continue with clear liquid diet until patient has a bowel function return.  Still not passing gas -Encouraged the patient to stay out of bed and ambulate -Appreciate pulmonology recommendation of management of severe COPD  Gae Gallop, MD

## 2023-02-13 NOTE — Plan of Care (Signed)
  Problem: Nutrition: Goal: Adequate nutrition will be maintained Outcome: Progressing   Problem: Coping: Goal: Level of anxiety will decrease Outcome: Progressing   Problem: Elimination: Goal: Will not experience complications related to bowel motility Outcome: Progressing   Problem: Skin Integrity: Goal: Risk for impaired skin integrity will decrease Outcome: Progressing

## 2023-02-14 LAB — CBC
HCT: 39.3 % (ref 39.0–52.0)
Hemoglobin: 12.9 g/dL — ABNORMAL LOW (ref 13.0–17.0)
MCH: 30.1 pg (ref 26.0–34.0)
MCHC: 32.8 g/dL (ref 30.0–36.0)
MCV: 91.8 fL (ref 80.0–100.0)
Platelets: 240 10*3/uL (ref 150–400)
RBC: 4.28 MIL/uL (ref 4.22–5.81)
RDW: 13.9 % (ref 11.5–15.5)
WBC: 10.5 10*3/uL (ref 4.0–10.5)
nRBC: 0 % (ref 0.0–0.2)

## 2023-02-14 LAB — SURGICAL PATHOLOGY

## 2023-02-14 LAB — BASIC METABOLIC PANEL
Anion gap: 7 (ref 5–15)
BUN: 14 mg/dL (ref 8–23)
CO2: 29 mmol/L (ref 22–32)
Calcium: 8.7 mg/dL — ABNORMAL LOW (ref 8.9–10.3)
Chloride: 104 mmol/L (ref 98–111)
Creatinine, Ser: 0.83 mg/dL (ref 0.61–1.24)
GFR, Estimated: >60 mL/min (ref >60)
Glucose, Bld: 106 mg/dL — ABNORMAL HIGH (ref 70–99)
Potassium: 4.3 mmol/L (ref 3.5–5.1)
Sodium: 140 mmol/L (ref 135–145)

## 2023-02-14 NOTE — Care Management Important Message (Signed)
Important Message  Patient Details  Name: Joe Torres MRN: [id number] Date of Birth: 07/21/1945   Important Message Given:  Yes - Medicare IM     Johnell Comings 02/14/2023, 2:09 PM

## 2023-02-14 NOTE — Plan of Care (Signed)

## 2023-02-14 NOTE — Progress Notes (Signed)
PULMONOLOGY         Date: 02/14/2023,   MRN# [id number] SHERLEY VIVO March 18, 1946     AdmissionWeight: 64.9 kg                 CurrentWeight: 64.9 kg  Referring provider: Dr. 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  Also has a background history of bladder cancer, BPH, cataracts, diverticulitis status post surgical evaluation and findings of perforated diverticulitis.  Patient had underwent partial colectomy with anastomosis and protective loop ileostomy.  Postoperatively patient was noted to have acute on chronic hypoxemia requiring 6 L/min nasal cannula.  Neri consultation placed for management of advanced COPD with chronic hypoxemia.   02/13/23- patient with no acute events overnight.  Wife at bedside.  He is on home setting of O2 requirement.  He is smiling during interview.  He had CPAP overnight without issues. He is not coughing. We dicussed follow up outpatient for continuity of care to optimize his COPD, chronic hypoxemia and OSA.  Patient is established with [facility] pulmonology and agrees to follow up with his previous lung team.  We have provided our contact information in case his pulmonogists wish to review his care while inpatient. Mr Tipper is cleared for dc home from pulmonary perspective.  Upon Dc he does not require prednisone or pulmicort or brovana or aformeterol.  He should have Daliresp daily, Spiriva once daily.  02/14/23- patient is up today on his feet.  His son is present during interview and examination.  He has not had BM yet but is urinating well. Respiratory status is stable, patient is in good spirits close to baseline.  Will continue to monitor peripherally , his blood work is  excellent with complete resolution of leucosytosis and normal renal function    PAST MEDICAL HISTORY   Past Medical History:  Diagnosis Date   Arthritis    Asthma    Bladder cancer (HCC)    BPH (benign prostatic hyperplasia)    Cancer (HCC)    Cataracts, bilateral    COPD, severe (HCC)    Diverticulitis of large intestine with perforation without bleeding    Dyspnea    GERD (gastroesophageal reflux disease)    Hyperlipidemia    Hypertension    Obstructive sleep apnea    C-pap with oxygen   Osteoarthritis    Panlobular emphysema (HCC)    Postoperative ileus (HCC)    Right inguinal hernia      SURGICAL HISTORY   Past Surgical History:  Procedure Laterality Date   APPENDECTOMY  02/12/2023   Procedure: APPENDECTOMY;  Surgeon: Carolan Shiver, MD;  Location: [facility] ORS;  Service: General;;   CATARACT EXTRACTION W/ INTRAOCULAR LENS IMPLANT Right 05/18/2013   CATARACT EXTRACTION W/ INTRAOCULAR LENS IMPLANT Left 07/06/2013   COLONOSCOPY     COLONOSCOPY WITH PROPOFOL N/A 01/16/2021   Procedure: COLONOSCOPY WITH PROPOFOL;  Surgeon: Wyline Mood, MD;  Location: [facility] ENDOSCOPY;  Service: Gastroenterology;  Laterality: N/A;  SPANISH INTERPRETER 9 AM ARRIVAL REQUEST   ILEOSTOMY N/A 11/03/2022   Procedure: ILEOSTOMY;  Surgeon: Carolan Shiver, MD;  Location: [facility] ORS;  Service: General;  Laterality: N/A;   ILEOSTOMY CLOSURE N/A 02/12/2023   Procedure: ILEOSTOMY  TAKEDOWN;  Surgeon: Carolan Shiver, MD;  Location: [facility] ORS;  Service: General;  Laterality: N/A;   INGUINAL HERNIA REPAIR Right 01/08/2016   LAPAROTOMY N/A 10/20/2022   Procedure: EXPLORATORY LAPAROTOMY,  SIGMOID COLECTOMY, LOOP ILEOSTOMY;  Surgeon: Carolan Shiver, MD;  Location: [facility] ORS;  Service: General;  Laterality: N/A;   PORTA CATH INSERTION N/A 06/19/2020   Procedure: PORTA CATH INSERTION;  Surgeon: Annice Needy, MD;  Location: [facility] INVASIVE CV LAB;  Service: Cardiovascular;  Laterality: N/A;    TRANSURETHRAL RESECTION OF BLADDER TUMOR WITH GYRUS (TURBT-GYRUS)  07/2020   TRANSURETHRAL RESECTION OF BLADDER TUMOR WITH MITOMYCIN-C N/A 05/22/2020   Procedure: TRANSURETHRAL RESECTION OF BLADDER TUMOR WITH Gemcitabine;  Surgeon: Vanna Scotland, MD;  Location: [facility] ORS;  Service: Urology;  Laterality: N/A;     FAMILY HISTORY   Family History  Problem Relation Age of Onset   Asthma Mother      SOCIAL HISTORY   Social History   Tobacco Use   Smoking status: Former    Current packs/day: 0.00    Average packs/day: 1.5 packs/day for 40.0 years (60.0 ttl pk-yrs)    Types: Cigarettes    Start date: 05/21/1968    Quit date: 05/21/2008    Years since quitting: 14.7    Passive exposure: Past   Smokeless tobacco: Never  Vaping Use   Vaping status: Never Used  Substance Use Topics   Alcohol use: Not Currently   Drug use: Never     MEDICATIONS    Home Medication:    Current Medication:  Current Facility-Administered Medications:    0.9 %  sodium chloride infusion, , Intravenous, Continuous, Cintron-Diaz, Edgardo, MD, Last Rate: 50 mL/hr at 02/14/23 0558, Infusion Verify at 02/14/23 0558   amLODipine (NORVASC) tablet 10 mg, 10 mg, Oral, QHS, Cintron-Diaz, Edgardo, MD, 10 mg at 02/13/23 2202   enoxaparin (LOVENOX) injection 40 mg, 40 mg, Subcutaneous, Q24H, Cintron-Diaz, Edgardo, MD, 40 mg at 02/13/23 0755   fluticasone (FLONASE) 50 MCG/ACT nasal spray 1 spray, 1 spray, Each Nare, Daily, Cintron-Diaz, Edgardo, MD, 1 spray at 02/13/23 0756   influenza vaccine adjuvanted (FLUAD) injection 0.5 mL, 0.5 mL, Intramuscular, Tomorrow-1000, Cintron-Diaz, Edgardo, MD   loratadine (CLARITIN) tablet 10 mg, 10 mg, Oral, Daily, Cintron-Diaz, Edgardo, MD, 10 mg at 02/13/23 0754   losartan (COZAAR) tablet 25 mg, 25 mg, Oral, Daily, Cintron-Diaz, Edgardo, MD, 25 mg at 02/13/23 0753   montelukast (SINGULAIR) tablet 10 mg, 10 mg, Oral, QHS, Cintron-Diaz, Edgardo, MD, 10 mg at 02/13/23 2202    morphine (PF) 4 MG/ML injection 4 mg, 4 mg, Intravenous, Q3H PRN, Carolan Shiver, MD, 4 mg at 02/14/23 0133   pantoprazole (PROTONIX) EC tablet 40 mg, 40 mg, Oral, Daily, Cintron-Diaz, Edgardo, MD, 40 mg at 02/13/23 0754   polyethylene glycol (MIRALAX / GLYCOLAX) packet 17 g, 17 g, Oral, Daily, Cintron-Diaz, Edgardo, MD, 17 g at 02/13/23 0755   potassium chloride SA (KLOR-CON M) CR tablet 20 mEq, 20 mEq, Oral, BID, Cintron-Diaz, Edgardo, MD, 20 mEq at 02/13/23 2202   roflumilast (DALIRESP) tablet 500 mcg, 500 mcg, Oral, Daily, Cintron-Diaz, Edgardo, MD, 500 mcg at 02/13/23 0800   tamsulosin (FLOMAX) capsule 0.4 mg, 0.4 mg, Oral, Daily, Cintron-Diaz, Edgardo, MD, 0.4 mg at 02/13/23 0753   tiotropium (SPIRIVA) inhalation capsule ([facility] use ONLY) 18 mcg, 18 mcg, Inhalation, Daily, Cintron-Diaz, Edgardo, MD, 18 mcg at 02/13/23 0755   traMADol (ULTRAM) tablet 50 mg, 50 mg, Oral, Q6H PRN, Carolan Shiver, MD, 50 mg at 02/13/23 1610  ALLERGIES   Patient has no known allergies.     REVIEW OF SYSTEMS    Review of Systems:  Gen:  Denies  fever, sweats, chills weigh loss  HEENT: Denies blurred vision, double vision, ear pain, eye pain, hearing loss, nose bleeds, sore throat Cardiac:  No dizziness, chest pain or heaviness, chest tightness,edema Resp:   reports dyspnea chronically  Gi: Denies swallowing difficulty, stomach pain, nausea or vomiting, diarrhea, constipation, bowel incontinence Gu:  Denies bladder incontinence, burning urine Ext:   Denies Joint pain, stiffness or swelling Skin: Denies  skin rash, easy bruising or bleeding or hives Endoc:  Denies polyuria, polydipsia , polyphagia or weight change Psych:   Denies depression, insomnia or hallucinations   Other:  All other systems negative   VS: BP 129/80 (BP Location: Left Arm)   Pulse 94   Temp 98.2 F (36.8 C)   Resp 18   Ht 5\' 7"  (1.702 m)   Wt 64.9 kg   SpO2 96%   BMI 22.40 kg/m      PHYSICAL EXAM     GENERAL:NAD, no fevers, chills, no weakness no fatigue HEAD: Normocephalic, atraumatic.  EYES: Pupils equal, round, reactive to light. Extraocular muscles intact. No scleral icterus.  MOUTH: Moist mucosal membrane. Dentition intact. No abscess noted.  EAR, NOSE, THROAT: Clear without exudates. No external lesions.  NECK: Supple. No thyromegaly. No nodules. No JVD.  PULMONARY: decreased breath sounds with mild rhonchi worse at bases bilaterally.  CARDIOVASCULAR: S1 and S2. Regular rate and rhythm. No murmurs, rubs, or gallops. No edema. Pedal pulses 2+ bilaterally.  GASTROINTESTINAL: Soft, nontender, nondistended. No masses. Positive bowel sounds. No hepatosplenomegaly.  MUSCULOSKELETAL: No swelling, clubbing, or edema. Range of motion full in all extremities.  NEUROLOGIC: Cranial nerves II through XII are intact. No gross focal neurological deficits. Sensation intact. Reflexes intact.  SKIN: No ulceration, lesions, rashes, or cyanosis. Skin warm and dry. Turgor intact.  PSYCHIATRIC: Mood, affect within normal limits. The patient is awake, alert and oriented x 3. Insight, judgment intact.       IMAGING       Narrative & Impression  CLINICAL DATA:  Abdominal pain, acute, nonlocalized; Pulmonary embolism (PE) suspected, high prob. History of bladder cancer. * Tracking Code: BO *   EXAM: CT ANGIOGRAPHY CHEST   CT ABDOMEN AND PELVIS WITH CONTRAST   TECHNIQUE: Multidetector CT imaging of the chest was performed using the standard protocol during bolus administration of intravenous contrast. Multiplanar CT image reconstructions and MIPs were obtained to evaluate the vascular anatomy. Multidetector CT imaging of the abdomen and pelvis was performed using the standard protocol during bolus administration of intravenous contrast.   RADIATION DOSE REDUCTION: This exam was performed according to the departmental dose-optimization program which includes automated exposure control,  adjustment of the mA and/or kV according to patient size and/or use of iterative reconstruction technique.   CONTRAST:  75mL OMNIPAQUE IOHEXOL 350 MG/ML SOLN   COMPARISON:  Chest radiograph from earlier today. 04/24/2022 CT chest, abdomen and pelvis.   FINDINGS: CTA CHEST FINDINGS   Cardiovascular: The study is high quality for the evaluation of pulmonary embolism. There are no filling defects in the central, lobar, segmental or subsegmental pulmonary artery branches to suggest acute pulmonary embolism. Atherosclerotic nonaneurysmal thoracic aorta. Normal caliber pulmonary arteries. Normal heart size. No significant pericardial fluid/thickening. Three-vessel coronary atherosclerosis. Right internal jugular Port-A-Cath terminates in the lower third of the SVC.   Mediastinum/Nodes: No significant thyroid nodules. Unremarkable esophagus. No pathologically  enlarged axillary, mediastinal or hilar lymph nodes.   Lungs/Pleura: No pneumothorax. No pleural effusion. Severe centrilobular emphysema with mild diffuse bronchial wall thickening. No acute consolidative airspace disease or lung masses. Scattered tiny calcified granulomas in the peripheral upper lobes are unchanged. No significant pulmonary nodules.   Musculoskeletal: No aggressive appearing focal osseous lesions. Mild thoracic spondylosis.   Review of the MIP images confirms the above findings.   CT ABDOMEN and PELVIS FINDINGS   Motion degraded scan, limiting assessment.   Hepatobiliary: For normal liver size. Subcentimeter hypodense posteroinferior right liver lesion, too small to characterize, unchanged, presumably benign. No new liver lesions. Normal gallbladder with no radiopaque cholelithiasis. No biliary ductal dilatation.   Pancreas: Normal, with no mass or duct dilation.   Spleen: Normal size. No mass.   Adrenals/Urinary Tract: Normal adrenals. Simple exophytic 1.9 cm renal cyst in the anterior interpolar  right kidney with numerous additional subcentimeter hypodense bilateral renal cortical lesions that are too small to characterize, for which no follow-up imaging is recommended. No hydronephrosis. Normal bladder.   Stomach/Bowel: Normal non-distended stomach. Normal caliber small bowel with no small bowel wall thickening. Normal appendix. Marked left colonic diverticulosis, most prominent in the sigmoid colon. There is focal pericolonic fat stranding and ill-defined free fluid with scattered free air in the region of the mid to distal sigmoid colon in the right pelvis (series 4/image 49), suggesting perforated sigmoid diverticulitis. No discrete measurable/drainable pericolonic abscess.   Vascular/Lymphatic: Atherosclerotic nonaneurysmal abdominal aorta. Patent portal, splenic, hepatic and renal veins. No pathologically enlarged lymph nodes in the abdomen or pelvis.   Reproductive: Mild prostatomegaly.   Other: Scattered small to moderate free air throughout the anterior upper peritoneal cavity (series 4/image 9). No ascites. Tiny chronic fat containing umbilical hernia.   Musculoskeletal: No aggressive appearing focal osseous lesions. Moderate thoracolumbar spondylosis.   Review of the MIP images confirms the above findings.   IMPRESSION: 1. No pulmonary embolism. 2. Small to moderate pneumoperitoneum, suspected to be due to perforated sigmoid diverticulitis. No abscess. 3. No evidence of metastatic disease in the chest, abdomen or pelvis. 4. Three-vessel coronary atherosclerosis. 5. Severe centrilobular emphysema with mild diffuse bronchial wall thickening, suggesting COPD. 6. Mild prostatomegaly. 7. Aortic Atherosclerosis (ICD10-I70.0) and Emphysema (ICD10-J43.9).   Critical Value/emergent results were called by telephone at the time of interpretation on 10/20/2022 at 10:36 am to provider [facility] , who verbally acknowledged these results.     Electronically  Signed   By: Delbert Phenix M.D.   On: 10/20/2022 10:38        ASSESSMENT/PLAN   Acute on chronic hypoxemic respiratory failure -most likely due to transient post anesthesia hypoventilation with background of centrilobular emphysmea -patient is being weaned on O2 requirement -will deliver incentive spiromtry to patient -duoneb nebulizer therapy x Q6h prn - no need for steroids at this time -will obtain CXR to evaluate for atelectasis vs infiltrates -no signs of COVID19 -spO2 goal >88% with goal 2L/min as per home setting -PT/OT when cleared by surgery -Ventolin MDI QID PRN while hospitlized         Thank you for allowing me to participate in the care of this patient.   Patient/Family are satisfied with care plan and all questions have been answered.    Provider disclosure: Patient with at least one acute or chronic illness or injury that poses a threat to life or bodily function and is being managed actively during this encounter.  All of the below services have been  performed independently by signing provider:  review of prior documentation from internal and or external health records.  Review of previous and current lab results.  Interview and comprehensive assessment during patient visit today. Review of current and previous chest radiographs/CT scans. Discussion of management and test interpretation with health care team and patient/family.   This document was prepared using Dragon voice recognition software and may include unintentional dictation errors.     Vida Rigger, M.D.  Division of Pulmonary & Critical Care Medicine

## 2023-02-14 NOTE — Progress Notes (Signed)
Patient ID: Joe Torres, male   DOB: 12/11/1945, 77 y.o.   MRN: [id number]     SURGICAL PROGRESS NOTE   Hospital Day(s): 2.   Interval History: Patient seen and examined, no acute events or new complaints overnight. Patient reports feeling better this morning.  He is still not passing gas but he feels that the intestines are moving.  Denies any nausea or vomiting.  Vital signs in last 24 hours: [min-max] current  Temp:  [98 F (36.7 C)-99 F (37.2 C)] 99 F (37.2 C) (10/04 0354) Pulse Rate:  [91-102] 91 (10/04 0354) Resp:  [16-20] 18 (10/04 0354) BP: (126-144)/(68-94) 126/68 (10/04 0354) SpO2:  [93 %-99 %] 93 % (10/04 0354) FiO2 (%):  [32 %] 32 % (10/03 2207)     Height: 5\' 7"  (170.2 cm) Weight: 64.9 kg BMI (Calculated): 22.39   Physical Exam:  Constitutional: alert, cooperative and no distress  Respiratory: breathing non-labored at rest  Cardiovascular: regular rate and sinus rhythm  Gastrointestinal: soft, non-tender, but mild-distended  Labs:     Latest Ref Rng & Units 02/14/2023    3:26 AM 02/13/2023    3:42 AM 02/03/2023    1:37 PM  CBC  WBC 4.0 - 10.5 K/uL 10.5  15.0  13.2   Hemoglobin 13.0 - 17.0 g/dL 86.5  78.4  69.6   Hematocrit 39.0 - 52.0 % 39.3  38.2  43.3   Platelets 150 - 400 K/uL 240  233  324       Latest Ref Rng & Units 02/14/2023    3:26 AM 02/13/2023    3:42 AM 02/03/2023    1:37 PM  CMP  Glucose 70 - 99 mg/dL 295  284  132   BUN 8 - 23 mg/dL 14  12  24    Creatinine 0.61 - 1.24 mg/dL 4.40  1.02  7.25   Sodium 135 - 145 mmol/L 140  139  139   Potassium 3.5 - 5.1 mmol/L 4.3  3.8  4.1   Chloride 98 - 111 mmol/L 104  103  102   CO2 22 - 32 mmol/L 29  28  27    Calcium 8.9 - 10.3 mg/dL 8.7  8.7  9.4     Imaging studies: No new pertinent imaging studies   Assessment/Plan:  77 y.o. male with history of ileostomy 2 Day Post-Op s/p ileostomy reversal, complicated by pertinent comorbidities including severe COPD   -Recovering slowly -Still with distended  abdomen this morning but softer than yesterday.  -Still no bowel function return -WBC count normalized, Stable hemoglobin. No significant electrolyte disturbance.  -Will continue current management.  -Pain management -Encourage to ambulate -Appreciate Pulmonology management of severe COPD.   Gae Gallop, MD

## 2023-02-15 ENCOUNTER — Encounter
Admission: RE | Disposition: A | Discharge status: Home / Self Care | Payer: Self-pay | Source: Ambulatory Visit | Attending: General Surgery

## 2023-02-15 ENCOUNTER — Inpatient Hospital Stay: Payer: Medicare HMO

## 2023-02-15 ENCOUNTER — Encounter: Payer: Self-pay | Admitting: General Surgery

## 2023-02-15 ENCOUNTER — Inpatient Hospital Stay: Payer: Medicare HMO | Admitting: Anesthesiology

## 2023-02-15 ENCOUNTER — Other Ambulatory Visit: Payer: Self-pay

## 2023-02-15 HISTORY — PX: LAPAROTOMY: SHX154

## 2023-02-15 SURGERY — LAPAROTOMY, EXPLORATORY
Anesthesia: General

## 2023-02-15 MED ORDER — CEFAZOLIN SODIUM-DEXTROSE 1-4 GM/50ML-% IV SOLN
INTRAVENOUS | Status: DC | PRN
  Administered 2023-02-15: 2 g via INTRAVENOUS

## 2023-02-15 MED ORDER — LIDOCAINE HCL (CARDIAC) PF 100 MG/5ML IV SOSY
PREFILLED_SYRINGE | INTRAVENOUS | Status: DC | PRN
  Administered 2023-02-15: 60 mg via INTRAVENOUS

## 2023-02-15 MED ORDER — OXYCODONE HCL 5 MG PO TABS: 5.0000 mg | ORAL_TABLET | Freq: Once | ORAL | Status: DC | PRN

## 2023-02-15 MED ORDER — PHENYLEPHRINE 80 MCG/ML (10ML) SYRINGE FOR IV PUSH (FOR BLOOD PRESSURE SUPPORT)
PREFILLED_SYRINGE | INTRAVENOUS | Status: DC | PRN
  Administered 2023-02-15 (×2): 160 ug via INTRAVENOUS

## 2023-02-15 MED ORDER — GLYCOPYRROLATE 0.2 MG/ML IJ SOLN
INTRAMUSCULAR | Status: DC | PRN
  Administered 2023-02-15 (×2): .1 mg via INTRAVENOUS

## 2023-02-15 MED ORDER — IOHEXOL 9 MG/ML PO SOLN
1000.0000 mL | Freq: Once | ORAL | Status: DC | PRN
  Administered 2023-02-15: 1000 mL via ORAL

## 2023-02-15 MED ORDER — VASOPRESSIN 20 UNIT/ML IV SOLN
INTRAVENOUS | Status: AC
  Filled 2023-02-15: qty 1

## 2023-02-15 MED ORDER — KETAMINE HCL 50 MG/5ML IJ SOSY
PREFILLED_SYRINGE | INTRAMUSCULAR | Status: AC
  Filled 2023-02-15: qty 5

## 2023-02-15 MED ORDER — ACETAMINOPHEN 10 MG/ML IV SOLN
INTRAVENOUS | Status: DC | PRN
  Administered 2023-02-15: 1000 mg via INTRAVENOUS

## 2023-02-15 MED ORDER — BUPIVACAINE-EPINEPHRINE (PF) 0.5% -1:200000 IJ SOLN
INTRAMUSCULAR | Status: AC
  Filled 2023-02-15: qty 30

## 2023-02-15 MED ORDER — BUPIVACAINE-EPINEPHRINE (PF) 0.5% -1:200000 IJ SOLN
INTRAMUSCULAR | Status: DC | PRN
  Administered 2023-02-15: 30 mL

## 2023-02-15 MED ORDER — FENTANYL CITRATE (PF) 100 MCG/2ML IJ SOLN
INTRAMUSCULAR | Status: AC
  Filled 2023-02-15: qty 2

## 2023-02-15 MED ORDER — ALBUTEROL SULFATE HFA 108 (90 BASE) MCG/ACT IN AERS
INHALATION_SPRAY | RESPIRATORY_TRACT | Status: DC | PRN
Start: 2023-02-15 — End: 2023-02-15
  Administered 2023-02-15: 4 via RESPIRATORY_TRACT

## 2023-02-15 MED ORDER — ONDANSETRON HCL 4 MG/2ML IJ SOLN: 4.0000 mg | Freq: Once | INTRAMUSCULAR | Status: DC | PRN

## 2023-02-15 MED ORDER — ACETAMINOPHEN 10 MG/ML IV SOLN: 1000.0000 mg | Freq: Once | INTRAVENOUS | Status: DC | PRN

## 2023-02-15 MED ORDER — PROPOFOL 10 MG/ML IV BOLUS
INTRAVENOUS | Status: DC | PRN
  Administered 2023-02-15: 100 mg via INTRAVENOUS

## 2023-02-15 MED ORDER — OXYCODONE HCL 5 MG/5ML PO SOLN: 5.0000 mg | Freq: Once | ORAL | Status: DC | PRN

## 2023-02-15 MED ORDER — ACETAMINOPHEN 10 MG/ML IV SOLN
INTRAVENOUS | Status: AC
  Filled 2023-02-15: qty 100

## 2023-02-15 MED ORDER — FENTANYL CITRATE (PF) 100 MCG/2ML IJ SOLN
25.0000 ug | INTRAMUSCULAR | Status: DC | PRN
  Administered 2023-02-15 (×3): 50 ug via INTRAVENOUS

## 2023-02-15 MED ORDER — VASOPRESSIN 20 UNIT/ML IV SOLN
INTRAVENOUS | Status: DC | PRN
Start: 2023-02-15 — End: 2023-02-15
  Administered 2023-02-15: .5 [IU] via INTRAVENOUS

## 2023-02-15 MED ORDER — IRRISEPT - 450ML BOTTLE WITH 0.05% CHG IN STERILE WATER, USP 99.95% OPTIME
TOPICAL | Status: DC | PRN
Start: 2023-02-15 — End: 2023-02-15
  Administered 2023-02-15: 450 mL

## 2023-02-15 MED ORDER — DEXAMETHASONE SODIUM PHOSPHATE 10 MG/ML IJ SOLN
INTRAMUSCULAR | Status: DC | PRN
  Administered 2023-02-15: 8 mg via INTRAVENOUS

## 2023-02-15 MED ORDER — PROPOFOL 10 MG/ML IV BOLUS
INTRAVENOUS | Status: AC
  Filled 2023-02-15: qty 20

## 2023-02-15 MED ORDER — 0.9 % SODIUM CHLORIDE (POUR BTL) OPTIME
TOPICAL | Status: DC | PRN
  Administered 2023-02-15 (×2): 500 mL

## 2023-02-15 MED ORDER — PHENYLEPHRINE HCL-NACL 20-0.9 MG/250ML-% IV SOLN
INTRAVENOUS | Status: DC | PRN
Start: 2023-02-15 — End: 2023-02-15
  Administered 2023-02-15: 50 ug/min via INTRAVENOUS
  Administered 2023-02-15: 25 ug/min via INTRAVENOUS

## 2023-02-15 MED ORDER — ONDANSETRON HCL 4 MG/2ML IJ SOLN
INTRAMUSCULAR | Status: DC | PRN
  Administered 2023-02-15: 4 mg via INTRAVENOUS

## 2023-02-15 MED ORDER — SUCCINYLCHOLINE CHLORIDE 200 MG/10ML IV SOSY
PREFILLED_SYRINGE | INTRAVENOUS | Status: DC | PRN
  Administered 2023-02-15: 80 mg via INTRAVENOUS

## 2023-02-15 MED ORDER — ROCURONIUM BROMIDE 100 MG/10ML IV SOLN
INTRAVENOUS | Status: DC | PRN
  Administered 2023-02-15: 20 mg via INTRAVENOUS
  Administered 2023-02-15: 40 mg via INTRAVENOUS

## 2023-02-15 MED ORDER — KETAMINE HCL 50 MG/5ML IJ SOSY
PREFILLED_SYRINGE | INTRAMUSCULAR | Status: DC | PRN
Start: 2023-02-15 — End: 2023-02-15
  Administered 2023-02-15 (×3): 10 mg via INTRAVENOUS

## 2023-02-15 MED ORDER — PHENYLEPHRINE HCL-NACL 20-0.9 MG/250ML-% IV SOLN
INTRAVENOUS | Status: AC
  Filled 2023-02-15: qty 250

## 2023-02-15 MED ORDER — IOHEXOL 300 MG/ML  SOLN
100.0000 mL | Freq: Once | INTRAMUSCULAR | Status: AC | PRN
  Administered 2023-02-15: 100 mL via INTRAVENOUS

## 2023-02-15 MED ORDER — FENTANYL CITRATE (PF) 100 MCG/2ML IJ SOLN
INTRAMUSCULAR | Status: DC | PRN
  Administered 2023-02-15: 100 ug via INTRAVENOUS
  Administered 2023-02-15: 50 ug via INTRAVENOUS

## 2023-02-15 MED ORDER — LACTATED RINGERS IV SOLN: INTRAVENOUS | Status: DC | PRN

## 2023-02-15 MED ORDER — FENTANYL CITRATE (PF) 100 MCG/2ML IJ SOLN
50.0000 ug | Freq: Once | INTRAMUSCULAR | Status: AC
  Administered 2023-02-15: 50 ug via INTRAVENOUS

## 2023-02-15 MED ORDER — SUGAMMADEX SODIUM 200 MG/2ML IV SOLN
INTRAVENOUS | Status: DC | PRN
  Administered 2023-02-15: 200 mg via INTRAVENOUS

## 2023-02-15 SURGICAL SUPPLY — 46 items
APL PRP STRL LF DISP 70% ISPRP (MISCELLANEOUS) ×1
CHLORAPREP W/TINT 26 (MISCELLANEOUS) ×1 IMPLANT
DRAPE LAPAROTOMY 100X77 ABD (DRAPES) ×1 IMPLANT
DRSG OPSITE POSTOP 4X10 (GAUZE/BANDAGES/DRESSINGS) IMPLANT
DRSG OPSITE POSTOP 4X8 (GAUZE/BANDAGES/DRESSINGS) IMPLANT
ELECT BLADE 6.5 EXT (BLADE) ×1 IMPLANT
ELECT CAUTERY BLADE 6.4 (BLADE) ×1 IMPLANT
ELECT REM PT RETURN 9FT ADLT (ELECTROSURGICAL) ×1
ELECTRODE REM PT RTRN 9FT ADLT (ELECTROSURGICAL) ×1 IMPLANT
GAUZE SPONGE 4X4 12PLY STRL (GAUZE/BANDAGES/DRESSINGS) IMPLANT
GLOVE BIO SURGEON STRL SZ 6.5 (GLOVE) ×1 IMPLANT
GLOVE BIOGEL PI IND STRL 6.5 (GLOVE) ×1 IMPLANT
GOWN STRL REUS W/ TWL LRG LVL3 (GOWN DISPOSABLE) ×2 IMPLANT
GOWN STRL REUS W/TWL LRG LVL3 (GOWN DISPOSABLE) ×2
HANDLE YANKAUER SUCT BULB TIP (MISCELLANEOUS) IMPLANT
JET LAVAGE IRRISEPT WOUND (IRRIGATION / IRRIGATOR) ×1
KIT TURNOVER KIT A (KITS) ×1 IMPLANT
LABEL OR SOLS (LABEL) ×1 IMPLANT
LAVAGE JET IRRISEPT WOUND (IRRIGATION / IRRIGATOR) IMPLANT
LIGASURE IMPACT 36 18CM CVD LR (INSTRUMENTS) IMPLANT
MANIFOLD NEPTUNE II (INSTRUMENTS) ×1 IMPLANT
NDL HYPO 22X1.5 SAFETY MO (MISCELLANEOUS) ×1 IMPLANT
NEEDLE HYPO 22X1.5 SAFETY MO (MISCELLANEOUS) ×1 IMPLANT
NS IRRIG 1000ML POUR BTL (IV SOLUTION) ×1 IMPLANT
PACK BASIN MAJOR ARMC (MISCELLANEOUS) ×1 IMPLANT
PACK COLON CLEAN CLOSURE (MISCELLANEOUS) ×1 IMPLANT
RELOAD PROXIMATE 75MM BLUE (ENDOMECHANICALS) ×2 IMPLANT
RELOAD STAPLE 75 3.8 BLU REG (ENDOMECHANICALS) IMPLANT
RETRACTOR WND ALEXIS-O 25 LRG (MISCELLANEOUS) IMPLANT
RTRCTR WOUND ALEXIS O 25CM LRG (MISCELLANEOUS)
SPONGE T-LAP 18X18 ~~LOC~~+RFID (SPONGE) IMPLANT
STAPLER GUN LINEAR PROX 60 (STAPLE) IMPLANT
STAPLER PROXIMATE 75MM BLUE (STAPLE) IMPLANT
STAPLER SKIN PROX 35W (STAPLE) ×1 IMPLANT
SUT PDS AB 0 CT1 27 (SUTURE) IMPLANT
SUT SILK 2 0 (SUTURE)
SUT SILK 2-0 18XBRD TIE 12 (SUTURE) IMPLANT
SUT SILK 3-0 (SUTURE) IMPLANT
SUT STRATAFIX PDS 30 CT-1 (SUTURE) IMPLANT
SUT V-LOC 90 ABS 3-0 VLT V-20 (SUTURE) IMPLANT
SUT VIC AB 3-0 SH 27 (SUTURE) ×1
SUT VIC AB 3-0 SH 27X BRD (SUTURE) ×1 IMPLANT
SYR 20ML LL LF (SYRINGE) ×2 IMPLANT
TRAP FLUID SMOKE EVACUATOR (MISCELLANEOUS) ×1 IMPLANT
TRAY FOLEY MTR SLVR 16FR STAT (SET/KITS/TRAYS/PACK) ×1 IMPLANT
WATER STERILE IRR 500ML POUR (IV SOLUTION) ×1 IMPLANT

## 2023-02-15 NOTE — Anesthesia Postprocedure Evaluation (Signed)
Anesthesia Post Note  Patient: Joe Torres  Procedure(s) Performed: EXPLORATORY LAPAROTOMY  Patient location during evaluation: PACU Anesthesia Type: General Level of consciousness: awake and alert Pain management: pain level controlled Vital Signs Assessment: post-procedure vital signs reviewed and stable Respiratory status: spontaneous breathing, nonlabored ventilation, respiratory function stable and patient connected to nasal cannula oxygen Cardiovascular status: blood pressure returned to baseline and stable Postop Assessment: no apparent nausea or vomiting Anesthetic complications: no   No notable events documented.   Last Vitals:  Vitals:   02/15/23 2302 02/15/23 2305  BP: 136/88   Pulse: (!) 106 (!) 105  Resp: 13 13  Temp:    SpO2: 93% 93%    Last Pain:  Vitals:   02/15/23 2250  TempSrc:   PainSc: Asleep                 Corinda Gubler

## 2023-02-15 NOTE — Transfer of Care (Signed)
Immediate Anesthesia Transfer of Care Note  Patient: Joe Torres  Procedure(s) Performed: EXPLORATORY LAPAROTOMY  Patient Location: PACU  Anesthesia Type:General  Level of Consciousness: drowsy  Airway & Oxygen Therapy: Patient Spontanous Breathing and Patient connected to face mask oxygen  Post-op Assessment: Report given to RN  Post vital signs: stable  Last Vitals:  Vitals Value Taken Time  BP 166/89 02/15/23 2200  Temp    Pulse 113 02/15/23 2204  Resp 15 02/15/23 2204  SpO2 95 % 02/15/23 2204  Vitals shown include unfiled device data.  Last Pain:  Vitals:   02/15/23 1528  TempSrc:   PainSc: 3       Patients Stated Pain Goal: 0 (02/15/23 1528)  Complications: No notable events documented.

## 2023-02-15 NOTE — Anesthesia Preprocedure Evaluation (Addendum)
Anesthesia Evaluation  Patient identified by MRN, date of birth, ID band Patient awake  General Assessment Comment:  Patient with small bowel obstruction Documented difficult airway, however last anesthetic showed a straightforward intubation with Mcgrath 3 blade  Reviewed: Allergy & Precautions, NPO status , Patient's Chart, lab work & pertinent test results  History of Anesthesia Complications Negative for: history of anesthetic complications  Airway Mallampati: III  TM Distance: <3 FB Neck ROM: full    Dental  (+) Missing, Poor Dentition   Pulmonary shortness of breath, asthma , sleep apnea , COPD,  COPD inhaler and oxygen dependent, Patient abstained from smoking.Not current smoker, former smoker Baseline home O2 use around 2-3L Preop SpO2 on 3L/min [place] = 96%   + rhonchi  + decreased breath sounds      Cardiovascular Exercise Tolerance: Poor METShypertension, Pt. on medications (-) angina (-) CAD and (-) Past MI Normal cardiovascular exam(-) dysrhythmias   MPS 5/22: - Normal myocardial perfusion study  - No evidence of any significant ischemia or scar  - Left ventricular systolic function is hyperdynamic. Post stress the  ejection fraction is > 60%.  - Coronary calcifications are noted  - Sensitivity and specificity of this test are reduced by the noted motion   ECHO 5/22: Summary   1. Technically difficult study.    2. The left ventricle is normal in size with normal wall thickness.    3. The left ventricular systolic function is normal, LVEF is visually  estimated at > 55%.    4. The right ventricle is mildly dilated in size, with normal systolic  function.   5. There is a small, anterior pericardial effusion.     Neuro/Psych  Neuromuscular disease  negative psych ROS   GI/Hepatic Neg liver ROS,GERD  Controlled,,  Endo/Other  negative endocrine ROSneg diabetes    Renal/GU negative Renal ROS      Musculoskeletal   Abdominal  (+)  Abdomen: tender.   Peds  Hematology negative hematology ROS (+)   Anesthesia Other Findings Pt is s/p partial colectomy with anastomosis and protective loop ileostomy.  Past Medical History: No date: Arthritis No date: Asthma No date: Bladder cancer (HCC) No date: Cancer (HCC) No date: COPD (chronic obstructive pulmonary disease) (HCC) No date: Dyspnea No date: GERD (gastroesophageal reflux disease) No date: Hypertension  Past Surgical History: No date: COLONOSCOPY 01/16/2021: COLONOSCOPY WITH PROPOFOL; N/A     Comment:  Procedure: COLONOSCOPY WITH PROPOFOL;  Surgeon: Wyline Mood, MD;  Location: [facility] ENDOSCOPY;  Service:               Gastroenterology;  Laterality: N/A;  SPANISH               INTERPRETER 9 AM ARRIVAL REQUEST 2017: HERNIA REPAIR 10/20/2022: LAPAROTOMY; N/A     Comment:  Procedure: EXPLORATORY LAPAROTOMY,  SIGMOID COLECTOMY,               LOOP ILEOSTOMY;  Surgeon: Carolan Shiver, MD;                Location: [facility] ORS;  Service: General;  Laterality: N/A; 06/19/2020: PORTA CATH INSERTION; N/A     Comment:  Procedure: PORTA CATH INSERTION;  Surgeon: Annice Needy,              MD;  Location: [facility] INVASIVE CV LAB;  Service:  Cardiovascular;  Laterality: N/A; 07/2020: TRANSURETHRAL RESECTION OF BLADDER TUMOR WITH GYRUS (TURBT- GYRUS) 05/22/2020: TRANSURETHRAL RESECTION OF BLADDER TUMOR WITH MITOMYCIN- C; N/A     Comment:  Procedure: TRANSURETHRAL RESECTION OF BLADDER TUMOR WITH              Gemcitabine;  Surgeon: Vanna Scotland, MD;  Location:               [facility] ORS;  Service: Urology;  Laterality: N/A;  BMI    Body Mass Index: 22.72 kg/m      Reproductive/Obstetrics negative OB ROS                             Anesthesia Physical Anesthesia Plan  ASA: 3 and emergent  Anesthesia Plan: General   Post-op Pain Management: Ofirmev IV (intra-op)*    Induction: Intravenous and Rapid sequence  PONV Risk Score and Plan: 4 or greater and Ondansetron, Dexamethasone and Treatment may vary due to age or medical condition  Airway Management Planned: Oral ETT and Video Laryngoscope Planned  Additional Equipment: None  Intra-op Plan:   Post-operative Plan: Extubation in OR and Possible Post-op intubation/ventilation  Informed Consent: I have reviewed the patients History and Physical, chart, labs and discussed the procedure including the risks, benefits and alternatives for the proposed anesthesia with the patient or authorized representative who has indicated his/her understanding and acceptance.    Discussed DNR with patient.   Dental Advisory Given and Interpreter used for interveiw  Plan Discussed with: Anesthesiologist, CRNA and Surgeon  Anesthesia Plan Comments: (In-situ NG tube present.  Patient and son consented for risks of anesthesia including but not limited to:  - adverse reactions to medications - damage to eyes, teeth, lips or other oral mucosa - nerve damage due to positioning  - sore throat or hoarseness - Damage to heart, brain, nerves, lungs, other parts of body or loss of life Discussed possible need for continued intubation post op.  They voiced understanding. Discussed goals of care in the event of perioperative cardiac arrest. Patient wishes for attempts at chest compressions and defibrillation if necessary.)       Anesthesia Quick Evaluation

## 2023-02-15 NOTE — Op Note (Signed)
Preoperative diagnosis: Intestinal obstruction  Postoperative diagnosis: Same  Procedure: Partial colectomy with removal of terminal ileum and anastomosis.   Anesthesia: GETA  Surgeon: Dr. Hazle Quant, MD  Wound Classification: Clean Contaminated  Indications: Patient is a 77 y.o. male with recent ileostomy reversal with bowel obstruction at the anastomosis.  Findings: 1.  Narrowing of the small bowel anastomosis of the terminal ileum. 2.  Not enough terminal ileum to be able to small bowel anastomosis and ileocecectomy was needed to be performed to be able to perform a wide anastomosis  Description of procedure:  The patient was placed in the supine position and general endotracheal anesthesia was induced. Sequential pneumatic compression devices were placed on lower extremities. Preoperative antibiotics were given. A Foley catheter and nasogastric tube were placed. The abdomen was prepped and draped in the usual sterile fashion. A time-out was completed verifying correct patient, procedure, site, positioning, and implant(s) and/or special equipment prior to beginning this procedure.  The right lower quadrant wound was reopened.  The PDS from the fascia was cut.  The abdominal cavity was entered.  A dilated cecum was identified.  It was able to be exteriorized identifying a narrow small intestinal anastomosis.  Since the previous anastomosis was at the distal terminal ileum, unable to redo the small intestinal anastomosis and ileocecectomy was performed.  The terminal ileum and cecum and were divided with GIA. The specimen was removed.  Hemostasis was checked in the operative field. The two ends of bowel were checked and found to be viable, with excellent blood supply. The proximal and distal segments were brought into apposition and found to lie comfortably next to each other. Two stay sutures of 3-0 silk were placed to approximate the antimesenteric borders of the bowel segments. Enterotomies  were made on the antimesenteric corner of the staple line on the ileum and ascending colon and the linear cutting stapler inserted and fired. Hemostasis was checked on the staple line. The enterotomies were then closed with 3-0 V-Loc. The anastomosis was checked and found to be intact and widely patent. Mesenteric defect was closed with interrupted 3-0 Vicryl.  The fascia was closed with a running suture of PDS 0. The skin was partially closed with staples and packed with wet-to-dry packing. The patient tolerated the procedure well and was taken to the postanesthesia care unit in stable condition.   Specimen: Terminal ileal anastomosis  Complications: None  Estimated Blood Loss: 25 mL

## 2023-02-15 NOTE — Plan of Care (Signed)
  Problem: Education: Goal: Knowledge of General Education information will improve Description: Including pain rating scale, medication(s)/side effects and non-pharmacologic comfort measures 02/15/2023 1611 by Murriel Hopper, Donald Pore, RN Outcome: Progressing 02/15/2023 1604 by Murriel Hopper, Donald Pore, RN Outcome: Progressing   Problem: Health Behavior/Discharge Planning: Goal: Ability to manage health-related needs will improve 02/15/2023 1611 by Monica Becton, RN Outcome: Progressing 02/15/2023 1604 by Murriel Hopper, Donald Pore, RN Outcome: Progressing   Problem: Clinical Measurements: Goal: Ability to maintain clinical measurements within normal limits will improve 02/15/2023 1611 by Monica Becton, RN Outcome: Progressing 02/15/2023 1604 by Murriel Hopper, Donald Pore, RN Outcome: Progressing Goal: Will remain free from infection 02/15/2023 1611 by Monica Becton, RN Outcome: Progressing 02/15/2023 1604 by Murriel Hopper, Donald Pore, RN Outcome: Progressing Goal: Diagnostic test results will improve 02/15/2023 1611 by Monica Becton, RN Outcome: Progressing 02/15/2023 1604 by Murriel Hopper, Donald Pore, RN Outcome: Progressing Goal: Respiratory complications will improve 02/15/2023 1611 by Monica Becton, RN Outcome: Progressing 02/15/2023 1604 by Murriel Hopper, Donald Pore, RN Outcome: Progressing Goal: Cardiovascular complication will be avoided 02/15/2023 1611 by Monica Becton, RN Outcome: Progressing 02/15/2023 1604 by Monica Becton, RN Outcome: Progressing   Problem: Activity: Goal: Risk for activity intolerance will decrease 02/15/2023 1611 by Murriel Hopper, Donald Pore, RN Outcome: Progressing 02/15/2023 1604 by Murriel Hopper, Donald Pore, RN Outcome: Progressing   Problem: Nutrition: Goal: Adequate nutrition will be maintained 02/15/2023 1611 by Murriel Hopper, Donald Pore, RN Outcome: Progressing 02/15/2023 1604 by Murriel Hopper, Donald Pore, RN Outcome: Progressing   Problem: Coping: Goal: Level of anxiety will decrease 02/15/2023 1611 by Murriel Hopper, Donald Pore, RN Outcome: Progressing 02/15/2023 1604 by Murriel Hopper, Donald Pore, RN Outcome: Progressing   Problem: Elimination: Goal: Will not experience complications related to bowel motility 02/15/2023 1611 by Monica Becton, RN Outcome: Progressing 02/15/2023 1604 by Murriel Hopper, Donald Pore, RN Outcome: Progressing Goal: Will not experience complications related to urinary retention 02/15/2023 1611 by Monica Becton, RN Outcome: Progressing 02/15/2023 1604 by Murriel Hopper, Donald Pore, RN Outcome: Progressing   Problem: Pain Managment: Goal: General experience of comfort will improve 02/15/2023 1611 by Monica Becton, RN Outcome: Progressing 02/15/2023 1604 by Murriel Hopper, Donald Pore, RN Outcome: Progressing   Problem: Safety: Goal: Ability to remain free from injury will improve 02/15/2023 1611 by Monica Becton, RN Outcome: Progressing 02/15/2023 1604 by Murriel Hopper, Donald Pore, RN Outcome: Progressing   Problem: Skin Integrity: Goal: Risk for impaired skin integrity will decrease 02/15/2023 1611 by Monica Becton, RN Outcome: Progressing 02/15/2023 1604 by Monica Becton, RN Outcome: Progressing

## 2023-02-15 NOTE — Progress Notes (Signed)
Patient ID: Joe Torres, male   DOB: May 28, 1945, 77 y.o.   MRN: [id number]   Patient reevaluated this evening.  She continued with progressive distention of the abdomen.  No acute abdomen without significant tenderness but more distended and tympanic.  Vital signs are stable.  Abdomen: Distended, tympanic, nontender to palpation  Assessment and plan  CT scan of the abdomen shows severe dilation of the small intestine with transition point at the anastomosis.  Discussed with patient recommendation of revision of the anastomosis with redo of the anastomosis with possible small bowel resection with new anastomosis from small bowel to large intestine.  Patient endorses he understand and agree to proceed with reopening of recent laparotomy to redo the anastomosis.  Carolan Shiver, MD

## 2023-02-15 NOTE — Plan of Care (Signed)
  Problem: Education: Goal: Knowledge of General Education information will improve Description: Including pain rating scale, medication(s)/side effects and non-pharmacologic comfort measures Outcome: Progressing   Problem: Clinical Measurements: Goal: Respiratory complications will improve Outcome: Progressing   Problem: Coping: Goal: Level of anxiety will decrease Outcome: Progressing   Problem: Elimination: Goal: Will not experience complications related to urinary retention Outcome: Progressing   Problem: Pain Managment: Goal: General experience of comfort will improve Outcome: Progressing   Problem: Safety: Goal: Ability to remain free from injury will improve Outcome: Progressing   Problem: Skin Integrity: Goal: Risk for impaired skin integrity will decrease Outcome: Progressing

## 2023-02-15 NOTE — Anesthesia Procedure Notes (Addendum)
Procedure Name: Intubation Date/Time: 02/15/2023 8:13 PM  Performed by: Jaye Beagle, CRNAPre-anesthesia Checklist: Patient identified, Emergency Drugs available, Suction available and Patient being monitored Patient Re-evaluated:Patient Re-evaluated prior to induction Oxygen Delivery Method: Circle system utilized Preoxygenation: Pre-oxygenation with 100% oxygen Induction Type: IV induction and Rapid sequence Laryngoscope Size: McGraph and 3 Grade View: Grade I Tube type: Oral Tube size: 7.0 mm Number of attempts: 1 Airway Equipment and Method: Stylet and Oral airway Placement Confirmation: ETT inserted through vocal cords under direct vision, positive ETCO2 and breath sounds checked- equal and bilateral Secured at: 22 cm Tube secured with: Tape Dental Injury: Teeth and Oropharynx as per pre-operative assessment  Comments: NGT decompressed prior to induction

## 2023-02-15 NOTE — Progress Notes (Signed)
Patient ID: AGUSTINE ROSSITTO, male   DOB: October 26, 1945, 77 y.o.   MRN: [id number]     SURGICAL PROGRESS NOTE   Hospital Day(s): 3.   Interval History: Patient seen and examined, no acute events or new complaints overnight. Patient reports feeling bloated and distended.  He has not felt nauseated yet but he is getting more comfortable.  Endorses abdominal discomfort but no significant pain.  Still not passing gas or had any bowel movement  Vital signs in last 24 hours: [min-max] current  Temp:  [98.2 F (36.8 C)-98.5 F (36.9 C)] 98.2 F (36.8 C) (10/05 0419) Pulse Rate:  [94-106] 100 (10/05 0419) Resp:  [16-20] 20 (10/05 0419) BP: (125-150)/(80-83) 147/81 (10/05 0419) SpO2:  [96 %-100 %] 100 % (10/05 0419) FiO2 (%):  [32 %] 32 % (10/04 2017)     Height: 5\' 7"  (170.2 cm) Weight: 64.9 kg BMI (Calculated): 22.39   Physical Exam:  Constitutional: alert, cooperative and no distress  Respiratory: breathing non-labored at rest  Cardiovascular: regular rate and sinus rhythm  Gastrointestinal: soft, non-tender, but distended and tympanic  Labs:     Latest Ref Rng & Units 02/14/2023    3:26 AM 02/13/2023    3:42 AM 02/03/2023    1:37 PM  CBC  WBC 4.0 - 10.5 K/uL 10.5  15.0  13.2   Hemoglobin 13.0 - 17.0 g/dL 40.9  81.1  91.4   Hematocrit 39.0 - 52.0 % 39.3  38.2  43.3   Platelets 150 - 400 K/uL 240  233  324       Latest Ref Rng & Units 02/14/2023    3:26 AM 02/13/2023    3:42 AM 02/03/2023    1:37 PM  CMP  Glucose 70 - 99 mg/dL 782  956  213   BUN 8 - 23 mg/dL 14  12  24    Creatinine 0.61 - 1.24 mg/dL 0.86  5.78  4.69   Sodium 135 - 145 mmol/L 140  139  139   Potassium 3.5 - 5.1 mmol/L 4.3  3.8  4.1   Chloride 98 - 111 mmol/L 104  103  102   CO2 22 - 32 mmol/L 29  28  27    Calcium 8.9 - 10.3 mg/dL 8.7  8.7  9.4     Imaging studies: No new pertinent imaging studies   Assessment/Plan:  77 y.o. male with history of ileostomy 3 Day Post-Op s/p ileostomy reversal, complicated by pertinent  comorbidities including severe COPD    -Stable vital signs, no fever -Continue with distention, now feeling bloated.  Still nauseated but uncomfortable -I placed NGT today -Continue NGT to low intermittent suction -After few hours with NGT to suction, will perform CT scan of the abdomen and pelvis for further evaluation of the anastomosis.  Possible edema of the anastomosis causing ileus versus obstruction due to narrowing. -Continue pain management -Encouraged the patient to ambulate  Gae Gallop, MD

## 2023-02-15 NOTE — Progress Notes (Signed)
Pt leaving floor to OR for revision of anamastosis/possible small bowel resection.  IVF disconnected.

## 2023-02-15 NOTE — Plan of Care (Signed)

## 2023-02-16 ENCOUNTER — Encounter: Payer: Self-pay | Admitting: General Surgery

## 2023-02-16 LAB — BASIC METABOLIC PANEL
Anion gap: 15 (ref 5–15)
BUN: 17 mg/dL (ref 8–23)
CO2: 24 mmol/L (ref 22–32)
Calcium: 8.3 mg/dL — ABNORMAL LOW (ref 8.9–10.3)
Chloride: 99 mmol/L (ref 98–111)
Creatinine, Ser: 0.8 mg/dL (ref 0.61–1.24)
GFR, Estimated: >60 mL/min (ref >60)
Glucose, Bld: 118 mg/dL — ABNORMAL HIGH (ref 70–99)
Potassium: 4.4 mmol/L (ref 3.5–5.1)
Sodium: 138 mmol/L (ref 135–145)

## 2023-02-16 LAB — MAGNESIUM: Magnesium: 2.1 mg/dL (ref 1.7–2.4)

## 2023-02-16 LAB — PHOSPHORUS: Phosphorus: 4.9 mg/dL — ABNORMAL HIGH (ref 2.5–4.6)

## 2023-02-16 MED ORDER — CHLORHEXIDINE GLUCONATE CLOTH 2 % EX PADS
6.0000 | MEDICATED_PAD | Freq: Every day | CUTANEOUS | Status: DC
  Administered 2023-02-16 – 2023-02-20 (×5): 6 via TOPICAL

## 2023-02-16 NOTE — Progress Notes (Signed)
Patient ID: Joe Torres, male   DOB: 12/12/1945, 77 y.o.   MRN: [id number]     SURGICAL PROGRESS NOTE   Hospital Day(s): 4.   Interval History: Patient seen and examined, no acute events or new complaints overnight. Patient reports feeling okay.  He denies any issues overnight.  No nausea or vomiting.  Still not passing gas.  Pain controlled with current pain medications.  Patient was taken to the operating room last night due to obstruction at the anastomosis site.  New ileocolonic anastomosis was performed.  Vital signs in last 24 hours: [min-max] current  Temp:  [98.3 F (36.8 C)-99 F (37.2 C)] 98.7 F (37.1 C) (10/06 0419) Pulse Rate:  [54-111] 97 (10/06 0419) Resp:  [11-20] 20 (10/06 0419) BP: (123-166)/(75-109) 137/81 (10/06 0419) SpO2:  [88 %-100 %] 97 % (10/06 0419)     Height: 5\' 7"  (170.2 cm) Weight: 64.9 kg BMI (Calculated): 22.39   Physical Exam:  Constitutional: alert, cooperative and no distress  Respiratory: breathing non-labored at rest  Cardiovascular: regular rate and sinus rhythm  Gastrointestinal: soft, non-tender, but-distended..  Right lower quadrant wound with expected serosanguineous drainage.  Labs:     Latest Ref Rng & Units 02/14/2023    3:26 AM 02/13/2023    3:42 AM 02/03/2023    1:37 PM  CBC  WBC 4.0 - 10.5 K/uL 10.5  15.0  13.2   Hemoglobin 13.0 - 17.0 g/dL 86.5  78.4  69.6   Hematocrit 39.0 - 52.0 % 39.3  38.2  43.3   Platelets 150 - 400 K/uL 240  233  324       Latest Ref Rng & Units 02/14/2023    3:26 AM 02/13/2023    3:42 AM 02/03/2023    1:37 PM  CMP  Glucose 70 - 99 mg/dL 295  284  132   BUN 8 - 23 mg/dL 14  12  24    Creatinine 0.61 - 1.24 mg/dL 4.40  1.02  7.25   Sodium 135 - 145 mmol/L 140  139  139   Potassium 3.5 - 5.1 mmol/L 4.3  3.8  4.1   Chloride 98 - 111 mmol/L 104  103  102   CO2 22 - 32 mmol/L 29  28  27    Calcium 8.9 - 10.3 mg/dL 8.7  8.7  9.4     Imaging studies: No new pertinent imaging studies   Assessment/Plan:  77  y.o. male with history of ileostomy 4 Day Post-Op s/p ileostomy reversal, complicated by pertinent comorbidities including severe COPD.   -Complicated by obstruction of the anastomosis site.  Status post new large ileocolonic anastomosis -Patient doing okay this morning.  Stable vital signs.  No fever -Still with postoperative ileus.  NGT in place. -Continue NGT to low intermittent suction -Continue pain management -Will check for electrolytes and replace as needed -Encourage patient to ambulate -Appreciate pulmonology for evaluation and management of severe COPD.  Gae Gallop, MD

## 2023-02-17 ENCOUNTER — Inpatient Hospital Stay: Payer: Medicare HMO

## 2023-02-17 MED ORDER — ARFORMOTEROL TARTRATE 15 MCG/2ML IN NEBU
15.0000 ug | INHALATION_SOLUTION | Freq: Two times a day (BID) | RESPIRATORY_TRACT | Status: DC
  Administered 2023-02-18 – 2023-02-21 (×7): 15 ug via RESPIRATORY_TRACT
  Filled 2023-02-17 (×9): qty 2

## 2023-02-17 MED ORDER — BUDESONIDE 0.25 MG/2ML IN SUSP
0.2500 mg | Freq: Two times a day (BID) | RESPIRATORY_TRACT | Status: DC
  Administered 2023-02-17 – 2023-02-21 (×8): 0.25 mg via RESPIRATORY_TRACT
  Filled 2023-02-17 (×8): qty 2

## 2023-02-17 MED ORDER — HYDROCHLOROTHIAZIDE 25 MG PO TABS
25.0000 mg | ORAL_TABLET | Freq: Every day | ORAL | Status: DC
  Administered 2023-02-17 – 2023-02-21 (×5): 25 mg via ORAL
  Filled 2023-02-17 (×5): qty 1

## 2023-02-17 MED ORDER — MUPIROCIN 2 % EX OINT
TOPICAL_OINTMENT | Freq: Two times a day (BID) | CUTANEOUS | Status: DC
  Filled 2023-02-17 (×2): qty 22

## 2023-02-17 MED ORDER — PANTOPRAZOLE SODIUM 40 MG IV SOLR
40.0000 mg | Freq: Every day | INTRAVENOUS | Status: DC
  Administered 2023-02-17 – 2023-02-19 (×3): 40 mg via INTRAVENOUS
  Filled 2023-02-17 (×3): qty 10

## 2023-02-17 NOTE — Progress Notes (Signed)
Patient ID: Joe Torres, male   DOB: 31-Mar-1946, 77 y.o.   MRN: [id number]     SURGICAL PROGRESS NOTE   Hospital Day(s): 5.   Interval History: Patient seen and examined, no acute events or new complaints overnight. Patient reports having intermittent pain.  Still not passing gas, still feels distended.  Vital signs in last 24 hours: [min-max] current  Temp:  [98.3 F (36.8 C)-99.6 F (37.6 C)] 98.4 F (36.9 C) (10/07 1155) Pulse Rate:  [97-115] 104 (10/07 1155) Resp:  [18-21] 18 (10/07 1155) BP: (146-154)/(75-92) 148/83 (10/07 1155) SpO2:  [96 %-99 %] 98 % (10/07 1155)     Height: 5\' 7"  (170.2 cm) Weight: 64.9 kg BMI (Calculated): 22.39   Physical Exam:  Constitutional: alert, cooperative and no distress  Respiratory: breathing non-labored at rest  Cardiovascular: regular rate and sinus rhythm  Gastrointestinal: soft, non-tender, and distended  Labs:     Latest Ref Rng & Units 02/14/2023    3:26 AM 02/13/2023    3:42 AM 02/03/2023    1:37 PM  CBC  WBC 4.0 - 10.5 K/uL 10.5  15.0  13.2   Hemoglobin 13.0 - 17.0 g/dL 95.6  21.3  08.6   Hematocrit 39.0 - 52.0 % 39.3  38.2  43.3   Platelets 150 - 400 K/uL 240  233  324       Latest Ref Rng & Units 02/16/2023    8:01 AM 02/14/2023    3:26 AM 02/13/2023    3:42 AM  CMP  Glucose 70 - 99 mg/dL 578  469  629   BUN 8 - 23 mg/dL 17  14  12    Creatinine 0.61 - 1.24 mg/dL 5.28  4.13  2.44   Sodium 135 - 145 mmol/L 138  140  139   Potassium 3.5 - 5.1 mmol/L 4.4  4.3  3.8   Chloride 98 - 111 mmol/L 99  104  103   CO2 22 - 32 mmol/L 24  29  28    Calcium 8.9 - 10.3 mg/dL 8.3  8.7  8.7     Imaging studies: No new pertinent imaging studies   Assessment/Plan:  77 y.o. male with history of ileostomy 5 Day Post-Op s/p ileostomy reversal, complicated by pertinent comorbidities including severe COPD.   --Complicated by obstruction of the anastomosis site.  Status post new large ileocolonic anastomosis postop day #2 -Patient continue with  postoperative ileus -Will continue with current management with NGT to low intermittent suction, pain management -If no improvement for tomorrow will repeat CT scan with oral contrast for evaluation of descending colon which was collapsed in the previous CT scan with liquid and air in the transverse colon. -Encouraged the patient to ambulate  Gae Gallop, MD

## 2023-02-17 NOTE — Plan of Care (Signed)

## 2023-02-17 NOTE — Progress Notes (Signed)
   02/17/23 2004  BiPAP/CPAP/SIPAP  BiPAP/CPAP/SIPAP Pt Type Adult  Reason BIPAP/CPAP not in use NG tube in place;Other(comment) (Per family, pulmonary doctor instructed patient not to use CPAP for at least 1 month post surgery. Home unit at bedside. NG tube in place as well)  Flow Rate 3 lpm  BiPAP/CPAP /SiPAP Vitals  Pulse Rate 100  Resp 20  SpO2 96 %  Bilateral Breath Sounds Rhonchi

## 2023-02-17 NOTE — Care Management Important Message (Signed)
Important Message  Patient Details  Name: Joe Torres MRN: [id number] Date of Birth: 1945/11/17   Important Message Given:  Yes - Medicare IM (This pt requested and recieved both spannish and english version of IM.)     Bernadette Hoit 02/17/2023, 3:36 PM

## 2023-02-17 NOTE — Progress Notes (Signed)
PULMONOLOGY         Date: 02/17/2023,   MRN# [id number] DONTRAY PEELE [birth year]     AdmissionWeight: 64.9 kg                 CurrentWeight: 64.9 kg  Referring provider: Dr. 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  Also has a background history of bladder cancer, BPH, cataracts, diverticulitis status post surgical evaluation and findings of perforated diverticulitis.  Patient had underwent partial colectomy with anastomosis and protective loop ileostomy.  Postoperatively patient was noted to have acute on chronic hypoxemia requiring 6 L/min nasal cannula.  Neri consultation placed for management of advanced COPD with chronic hypoxemia.   02/13/23- patient with no acute events overnight.  Wife at bedside.  He is on home setting of O2 requirement.  He is smiling during interview.  He had CPAP overnight without issues. He is not coughing. We dicussed follow up outpatient for continuity of care to optimize his COPD, chronic hypoxemia and OSA.  Patient is established with [facility] pulmonology and agrees to follow up with his previous lung team.  We have provided our contact information in case his pulmonogists wish to review his care while inpatient. Mr Witczak is cleared for dc home from pulmonary perspective.  Upon Dc he does not require prednisone or pulmicort or brovana or aformeterol.  He should have Daliresp daily, Spiriva once daily.  02/14/23- patient is up today on his feet.  His son is present during interview and examination.  He has not had BM yet but is urinating well. Respiratory status is stable, patient is in good spirits close to baseline.  Will continue to monitor peripherally , his blood work is  excellent with complete resolution of leucosytosis and normal renal function   02/17/23- patient evaluated at bedside, reports no new issues.  He has blood around nares from skin breakdown due to pressure injury from nasal canula.  Bacitracin ointment has been ordered.  Discussed with daughter at bedside.    PAST MEDICAL HISTORY   Past Medical History:  Diagnosis Date   Arthritis    Asthma    Bladder cancer (HCC)    BPH (benign prostatic hyperplasia)    Cancer (HCC)    Cataracts, bilateral    COPD, severe (HCC)    Diverticulitis of large intestine with perforation without bleeding    Dyspnea    GERD (gastroesophageal reflux disease)    Hyperlipidemia    Hypertension    Obstructive sleep apnea    C-pap with oxygen   Osteoarthritis    Panlobular emphysema (HCC)    Postoperative ileus (HCC)    Right inguinal hernia      SURGICAL HISTORY   Past Surgical History:  Procedure Laterality Date   APPENDECTOMY  02/12/2023   Procedure: APPENDECTOMY;  Surgeon: Carolan Shiver, MD;  Location: [facility] ORS;  Service: General;;   CATARACT EXTRACTION W/ INTRAOCULAR LENS IMPLANT Right 05/18/2013   CATARACT EXTRACTION W/ INTRAOCULAR LENS IMPLANT Left 07/06/2013   COLONOSCOPY     COLONOSCOPY WITH PROPOFOL N/A 01/16/2021   Procedure: COLONOSCOPY WITH PROPOFOL;  Surgeon: Wyline Mood, MD;  Location: [facility] ENDOSCOPY;  Service: Gastroenterology;  Laterality: N/A;  SPANISH  INTERPRETER 9 AM ARRIVAL REQUEST   ILEOSTOMY N/A 11/03/2022   Procedure: ILEOSTOMY;  Surgeon: Carolan Shiver, MD;  Location: [facility] ORS;  Service: General;  Laterality: N/A;   ILEOSTOMY CLOSURE N/A 02/12/2023   Procedure: ILEOSTOMY TAKEDOWN;  Surgeon: Carolan Shiver, MD;  Location: [facility] ORS;  Service: General;  Laterality: N/A;   INGUINAL HERNIA REPAIR Right 01/08/2016   LAPAROTOMY N/A 10/20/2022   Procedure: EXPLORATORY LAPAROTOMY,  SIGMOID COLECTOMY, LOOP ILEOSTOMY;  Surgeon: Carolan Shiver, MD;  Location:  [facility] ORS;  Service: General;  Laterality: N/A;   LAPAROTOMY N/A 02/15/2023   Procedure: EXPLORATORY LAPAROTOMY;  Surgeon: Carolan Shiver, MD;  Location: [facility] ORS;  Service: General;  Laterality: N/A;   PORTA CATH INSERTION N/A 06/19/2020   Procedure: PORTA CATH INSERTION;  Surgeon: Annice Needy, MD;  Location: [facility] INVASIVE CV LAB;  Service: Cardiovascular;  Laterality: N/A;   TRANSURETHRAL RESECTION OF BLADDER TUMOR WITH GYRUS (TURBT-GYRUS)  07/2020   TRANSURETHRAL RESECTION OF BLADDER TUMOR WITH MITOMYCIN-C N/A 05/22/2020   Procedure: TRANSURETHRAL RESECTION OF BLADDER TUMOR WITH Gemcitabine;  Surgeon: Vanna Scotland, MD;  Location: [facility] ORS;  Service: Urology;  Laterality: N/A;     FAMILY HISTORY   Family History  Problem Relation Age of Onset   Asthma Mother      SOCIAL HISTORY   Social History   Tobacco Use   Smoking status: Former    Current packs/day: 0.00    Average packs/day: 1.5 packs/day for 40.0 years (60.0 ttl pk-yrs)    Types: Cigarettes    Start date: 05/21/1968    Quit date: 05/21/2008    Years since quitting: 14.7    Passive exposure: Past   Smokeless tobacco: Never  Vaping Use   Vaping status: Never Used  Substance Use Topics   Alcohol use: Not Currently   Drug use: Never     MEDICATIONS    Home Medication:    Current Medication:  Current Facility-Administered Medications:    0.9 %  sodium chloride infusion, , Intravenous, Continuous, Cintron-Diaz, Edgardo, MD, Last Rate: 50 mL/hr at 02/17/23 0533, New Bag at 02/17/23 0533   amLODipine (NORVASC) tablet 10 mg, 10 mg, Oral, QHS, Cintron-Diaz, Edgardo, MD, 10 mg at 02/16/23 2311   Chlorhexidine Gluconate Cloth 2 % PADS 6 each, 6 each, Topical, Q0600, Carolan Shiver, MD, 6 each at 02/17/23 0534   enoxaparin (LOVENOX) injection 40 mg, 40 mg, Subcutaneous, Q24H, Cintron-Diaz, Edgardo, MD, 40 mg at 02/16/23 0933   fluticasone (FLONASE) 50 MCG/ACT nasal spray 1 spray, 1 spray, Each Nare, Daily,  Cintron-Diaz, Lurline Idol, MD, 1 spray at 02/16/23 0934   influenza vaccine adjuvanted (FLUAD) injection 0.5 mL, 0.5 mL, Intramuscular, Tomorrow-1000, Cintron-Diaz, Edgardo, MD   iohexol (OMNIPAQUE) 9 MG/ML oral solution 1,000 mL, 1,000 mL, Oral, Once PRN, Carolan Shiver, MD, 1,000 mL at 02/15/23 1455   loratadine (CLARITIN) tablet 10 mg, 10 mg, Oral, Daily, Cintron-Diaz, Edgardo, MD, 10 mg at 02/16/23 0933   losartan (COZAAR) tablet 25 mg, 25 mg, Oral, Daily, Cintron-Diaz, Edgardo, MD, 25 mg at 02/16/23 0933   montelukast (SINGULAIR) tablet 10 mg, 10 mg, Oral, QHS, Cintron-Diaz, Edgardo, MD, 10 mg at 02/16/23 2311   morphine (PF) 4 MG/ML injection 4 mg, 4 mg, Intravenous, Q3H PRN, Carolan Shiver, MD, 4 mg at 02/17/23 0524   pantoprazole (PROTONIX) EC tablet 40 mg, 40 mg, Oral, Daily, Cintron-Diaz, Edgardo, MD, 40 mg at 02/16/23 0933   polyethylene glycol (MIRALAX / GLYCOLAX) packet 17 g, 17 g, Oral, Daily, Carolan Shiver, MD,  17 g at 02/16/23 0934   potassium chloride SA (KLOR-CON M) CR tablet 20 mEq, 20 mEq, Oral, BID, Cintron-Diaz, Edgardo, MD, 20 mEq at 02/16/23 2311   roflumilast (DALIRESP) tablet 500 mcg, 500 mcg, Oral, Daily, Cintron-Diaz, Lurline Idol, MD, 500 mcg at 02/16/23 0934   tamsulosin (FLOMAX) capsule 0.4 mg, 0.4 mg, Oral, Daily, Cintron-Diaz, Edgardo, MD, 0.4 mg at 02/16/23 0933   tiotropium (SPIRIVA) inhalation capsule ([facility] use ONLY) 18 mcg, 18 mcg, Inhalation, Daily, Carolan Shiver, MD, 18 mcg at 02/16/23 0934   traMADol (ULTRAM) tablet 50 mg, 50 mg, Oral, Q6H PRN, Carolan Shiver, MD, 50 mg at 02/16/23 1515    ALLERGIES   Patient has no known allergies.     REVIEW OF SYSTEMS    Review of Systems:  Gen:  Denies  fever, sweats, chills weigh loss  HEENT: Denies blurred vision, double vision, ear pain, eye pain, hearing loss, nose bleeds, sore throat Cardiac:  No dizziness, chest pain or heaviness, chest tightness,edema Resp:   reports  dyspnea chronically  Gi: Denies swallowing difficulty, stomach pain, nausea or vomiting, diarrhea, constipation, bowel incontinence Gu:  Denies bladder incontinence, burning urine Ext:   Denies Joint pain, stiffness or swelling Skin: Denies  skin rash, easy bruising or bleeding or hives Endoc:  Denies polyuria, polydipsia , polyphagia or weight change Psych:   Denies depression, insomnia or hallucinations   Other:  All other systems negative   VS: BP (!) 146/85 (BP Location: Left Arm)   Pulse 99   Temp 98.3 F (36.8 C) (Oral)   Resp 19   Ht 5\' 7"  (1.702 m)   Wt 64.9 kg   SpO2 98%   BMI 22.40 kg/m      PHYSICAL EXAM    GENERAL:NAD, no fevers, chills, no weakness no fatigue HEAD: Normocephalic, atraumatic.  EYES: Pupils equal, round, reactive to light. Extraocular muscles intact. No scleral icterus.  MOUTH: Moist mucosal membrane. Dentition intact. No abscess noted.  EAR, NOSE, THROAT: Clear without exudates. No external lesions.  NECK: Supple. No thyromegaly. No nodules. No JVD.  PULMONARY: decreased breath sounds with mild rhonchi worse at bases bilaterally.  CARDIOVASCULAR: S1 and S2. Regular rate and rhythm. No murmurs, rubs, or gallops. No edema. Pedal pulses 2+ bilaterally.  GASTROINTESTINAL: Soft, nontender, nondistended. No masses. Positive bowel sounds. No hepatosplenomegaly.  MUSCULOSKELETAL: No swelling, clubbing, or edema. Range of motion full in all extremities.  NEUROLOGIC: Cranial nerves II through XII are intact. No gross focal neurological deficits. Sensation intact. Reflexes intact.  SKIN: No ulceration, lesions, rashes, or cyanosis. Skin warm and dry. Turgor intact.  PSYCHIATRIC: Mood, affect within normal limits. The patient is awake, alert and oriented x 3. Insight, judgment intact.       IMAGING       Narrative & Impression  CLINICAL DATA:  Abdominal pain, acute, nonlocalized; Pulmonary embolism (PE) suspected, high prob. History of bladder  cancer. * Tracking Code: BO *   EXAM: CT ANGIOGRAPHY CHEST   CT ABDOMEN AND PELVIS WITH CONTRAST   TECHNIQUE: Multidetector CT imaging of the chest was performed using the standard protocol during bolus administration of intravenous contrast. Multiplanar CT image reconstructions and MIPs were obtained to evaluate the vascular anatomy. Multidetector CT imaging of the abdomen and pelvis was performed using the standard protocol during bolus administration of intravenous contrast.   RADIATION DOSE REDUCTION: This exam was performed according to the departmental dose-optimization program which includes automated exposure control, adjustment of the mA and/or kV according to  patient size and/or use of iterative reconstruction technique.   CONTRAST:  75mL OMNIPAQUE IOHEXOL 350 MG/ML SOLN   COMPARISON:  Chest radiograph from earlier today. 04/24/2022 CT chest, abdomen and pelvis.   FINDINGS: CTA CHEST FINDINGS   Cardiovascular: The study is high quality for the evaluation of pulmonary embolism. There are no filling defects in the central, lobar, segmental or subsegmental pulmonary artery branches to suggest acute pulmonary embolism. Atherosclerotic nonaneurysmal thoracic aorta. Normal caliber pulmonary arteries. Normal heart size. No significant pericardial fluid/thickening. Three-vessel coronary atherosclerosis. Right internal jugular Port-A-Cath terminates in the lower third of the SVC.   Mediastinum/Nodes: No significant thyroid nodules. Unremarkable esophagus. No pathologically enlarged axillary, mediastinal or hilar lymph nodes.   Lungs/Pleura: No pneumothorax. No pleural effusion. Severe centrilobular emphysema with mild diffuse bronchial wall thickening. No acute consolidative airspace disease or lung masses. Scattered tiny calcified granulomas in the peripheral upper lobes are unchanged. No significant pulmonary nodules.   Musculoskeletal: No aggressive appearing focal  osseous lesions. Mild thoracic spondylosis.   Review of the MIP images confirms the above findings.   CT ABDOMEN and PELVIS FINDINGS   Motion degraded scan, limiting assessment.   Hepatobiliary: For normal liver size. Subcentimeter hypodense posteroinferior right liver lesion, too small to characterize, unchanged, presumably benign. No new liver lesions. Normal gallbladder with no radiopaque cholelithiasis. No biliary ductal dilatation.   Pancreas: Normal, with no mass or duct dilation.   Spleen: Normal size. No mass.   Adrenals/Urinary Tract: Normal adrenals. Simple exophytic 1.9 cm renal cyst in the anterior interpolar right kidney with numerous additional subcentimeter hypodense bilateral renal cortical lesions that are too small to characterize, for which no follow-up imaging is recommended. No hydronephrosis. Normal bladder.   Stomach/Bowel: Normal non-distended stomach. Normal caliber small bowel with no small bowel wall thickening. Normal appendix. Marked left colonic diverticulosis, most prominent in the sigmoid colon. There is focal pericolonic fat stranding and ill-defined free fluid with scattered free air in the region of the mid to distal sigmoid colon in the right pelvis (series 4/image 49), suggesting perforated sigmoid diverticulitis. No discrete measurable/drainable pericolonic abscess.   Vascular/Lymphatic: Atherosclerotic nonaneurysmal abdominal aorta. Patent portal, splenic, hepatic and renal veins. No pathologically enlarged lymph nodes in the abdomen or pelvis.   Reproductive: Mild prostatomegaly.   Other: Scattered small to moderate free air throughout the anterior upper peritoneal cavity (series 4/image 9). No ascites. Tiny chronic fat containing umbilical hernia.   Musculoskeletal: No aggressive appearing focal osseous lesions. Moderate thoracolumbar spondylosis.   Review of the MIP images confirms the above findings.   IMPRESSION: 1. No  pulmonary embolism. 2. Small to moderate pneumoperitoneum, suspected to be due to perforated sigmoid diverticulitis. No abscess. 3. No evidence of metastatic disease in the chest, abdomen or pelvis. 4. Three-vessel coronary atherosclerosis. 5. Severe centrilobular emphysema with mild diffuse bronchial wall thickening, suggesting COPD. 6. Mild prostatomegaly. 7. Aortic Atherosclerosis (ICD10-I70.0) and Emphysema (ICD10-J43.9).   Critical Value/emergent results were called by telephone at the time of interpretation on 10/20/2022 at 10:36 am to provider [facility] , who verbally acknowledged these results.     Electronically Signed   By: Delbert Phenix M.D.   On: 10/20/2022 10:38        ASSESSMENT/PLAN   Acute on chronic hypoxemic respiratory failure -most likely due to transient post anesthesia hypoventilation with background of centrilobular emphysmea -patient is being weaned on O2 requirement -will deliver incentive spiromtry to patient -duoneb nebulizer therapy x Q6h prn - no need for  steroids at this time -will obtain CXR to evaluate for atelectasis vs infiltrates -no signs of COVID19 -spO2 goal >88% with goal 2L/min as per home setting -PT/OT when cleared by surgery -Ventolin MDI QID PRN while hospitlized  -bactritracin ointment for nares   OSA on CPAP  Patient brought in his CPAP ResMEd device.  -he has NG tube and ideally should wait 1 month to use it so pressure in abdomen is low   Thank you for allowing me to participate in the care of this patient.   Patient/Family are satisfied with care plan and all questions have been answered.    Provider disclosure: Patient with at least one acute or chronic illness or injury that poses a threat to life or bodily function and is being managed actively during this encounter.  All of the below services have been performed independently by signing provider:  review of prior documentation from internal and or external health  records.  Review of previous and current lab results.  Interview and comprehensive assessment during patient visit today. Review of current and previous chest radiographs/CT scans. Discussion of management and test interpretation with health care team and patient/family.   This document was prepared using Dragon voice recognition software and may include unintentional dictation errors.     Vida Rigger, M.D.  Division of Pulmonary & Critical Care Medicine

## 2023-02-18 ENCOUNTER — Inpatient Hospital Stay: Payer: Medicare HMO

## 2023-02-18 LAB — SURGICAL PATHOLOGY

## 2023-02-18 MED ORDER — PANCRELIPASE (LIP-PROT-AMYL) 10440-39150 UNITS PO TABS
20880.0000 [IU] | ORAL_TABLET | Freq: Once | ORAL | Status: AC
  Administered 2023-02-18: 20880 [IU]
  Filled 2023-02-18: qty 2

## 2023-02-18 MED ORDER — SODIUM BICARBONATE 650 MG PO TABS
650.0000 mg | ORAL_TABLET | Freq: Once | ORAL | Status: AC
  Administered 2023-02-18: 650 mg
  Filled 2023-02-18: qty 1

## 2023-02-18 MED ORDER — IOHEXOL 300 MG/ML  SOLN
100.0000 mL | Freq: Once | INTRAMUSCULAR | Status: AC | PRN
  Administered 2023-02-18: 100 mL via INTRAVENOUS

## 2023-02-18 MED ORDER — DUPILUMAB 300 MG/2ML ~~LOC~~ SOAJ
300.0000 mg | SUBCUTANEOUS | Status: DC
  Administered 2023-02-18: 300 mg via SUBCUTANEOUS
  Filled 2023-02-18: qty 2

## 2023-02-18 MED ORDER — IOHEXOL 9 MG/ML PO SOLN
500.0000 mL | ORAL | Status: AC
  Administered 2023-02-18 (×2): 500 mL

## 2023-02-18 MED ORDER — PHENOL 1.4 % MT LIQD
1.0000 | OROMUCOSAL | Status: DC | PRN
  Filled 2023-02-18: qty 177

## 2023-02-18 NOTE — TOC Progression Note (Signed)
Transition of Care [facility]) - Progression Note    Patient Details  Name: Joe Torres MRN: [id number] Date of Birth: January 26, 1946  Transition of Care [facility]) CM/SW Contact  Margarito Liner, LCSW Phone Number: 02/18/2023, 10:07 AM  Clinical Narrative:  TOC continues to follow for discharge needs.   Expected Discharge Plan: Home w Home Health Services Barriers to Discharge: Continued Medical Work up  Expected Discharge Plan and Services       Living arrangements for the past 2 months: Single Family Home                                       Social Determinants of Health (SDOH) Interventions SDOH Screenings   Food Insecurity: No Food Insecurity (02/12/2023)  Housing: Low Risk  (02/12/2023)  Transportation Needs: No Transportation Needs (02/12/2023)  Utilities: Not At Risk (02/12/2023)  Financial Resource Strain: Low Risk  (04/22/2019)   Received from [facility], [facility]  Tobacco Use: Medium Risk (02/12/2023)  Health Literacy: Medium Risk (08/20/2020)   Received from [facility], [facility]    Readmission Risk Interventions     No data to display

## 2023-02-18 NOTE — Plan of Care (Signed)
Patient remains on AR-2C at time of writing. Patient is requiring 3 L / min of supplemental O2 via [place] (baseline requirement). NGT documented from earlier is not present at change of shift. Per off-going RN Donald Pore), the NGT was removed early and the surgical team is aware. Abdominal incision to RLQ with guaze dressing.   Problem: Education: Goal: Knowledge of General Education information will improve Description: Including pain rating scale, medication(s)/side effects and non-pharmacologic comfort measures Outcome: Progressing   Problem: Health Behavior/Discharge Planning: Goal: Ability to manage health-related needs will improve Outcome: Progressing   Problem: Clinical Measurements: Goal: Ability to maintain clinical measurements within normal limits will improve Outcome: Progressing Goal: Will remain free from infection Outcome: Progressing Goal: Diagnostic test results will improve Outcome: Progressing Goal: Respiratory complications will improve Outcome: Progressing Goal: Cardiovascular complication will be avoided Outcome: Progressing   Problem: Activity: Goal: Risk for activity intolerance will decrease Outcome: Progressing   Problem: Nutrition: Goal: Adequate nutrition will be maintained Outcome: Progressing   Problem: Coping: Goal: Level of anxiety will decrease Outcome: Progressing   Problem: Elimination: Goal: Will not experience complications related to bowel motility Outcome: Progressing Goal: Will not experience complications related to urinary retention Outcome: Progressing   Problem: Pain Managment: Goal: General experience of comfort will improve Outcome: Progressing   Problem: Safety: Goal: Ability to remain free from injury will improve Outcome: Progressing   Problem: Skin Integrity: Goal: Risk for impaired skin integrity will decrease Outcome: Progressing

## 2023-02-18 NOTE — Progress Notes (Signed)
PULMONOLOGY         Date: 02/18/2023,   MRN# [id number] Joe Torres August 24, 1945     AdmissionWeight: 64.9 kg                 CurrentWeight: 64.9 kg  Referring provider: Dr. 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  Also has a background history of bladder cancer, BPH, cataracts, diverticulitis status post surgical evaluation and findings of perforated diverticulitis.  Patient had underwent partial colectomy with anastomosis and protective loop ileostomy.  Postoperatively patient was noted to have acute on chronic hypoxemia requiring 6 L/min nasal cannula.  Neri consultation placed for management of advanced COPD with chronic hypoxemia.   02/13/23- patient with no acute events overnight.  Wife at bedside.  He is on home setting of O2 requirement.  He is smiling during interview.  He had CPAP overnight without issues. He is not coughing. We dicussed follow up outpatient for continuity of care to optimize his COPD, chronic hypoxemia and OSA.  Patient is established with [facility] pulmonology and agrees to follow up with his previous lung team.  We have provided our contact information in case his pulmonogists wish to review his care while inpatient. Mr Vittitow is cleared for dc home from pulmonary perspective.  Upon Dc he does not require prednisone or pulmicort or brovana or aformeterol.  He should have Daliresp daily, Spiriva once daily.  02/14/23- patient is up today on his feet.  His son is present during interview and examination.  He has not had BM yet but is urinating well. Respiratory status is stable, patient is in good spirits close to baseline.  Will continue to monitor peripherally , his blood work is  excellent with complete resolution of leucosytosis and normal renal function   02/17/23- patient evaluated at bedside, reports no new issues.  He has blood around nares from skin breakdown due to pressure injury from nasal canula.  Bacitracin ointment has been ordered.  Discussed with daughter at bedside.   02/18/23- patient resting in bed. He is on 3L/min [place].  He is due for dupixent today.  We have restarted his brovana and pulmicort.  He is ambulatory    PAST MEDICAL HISTORY   Past Medical History:  Diagnosis Date   Arthritis    Asthma    Bladder cancer (HCC)    BPH (benign prostatic hyperplasia)    Cancer (HCC)    Cataracts, bilateral    COPD, severe (HCC)    Diverticulitis of large intestine with perforation without bleeding    Dyspnea    GERD (gastroesophageal reflux disease)    Hyperlipidemia    Hypertension    Obstructive sleep apnea    C-pap with oxygen   Osteoarthritis    Panlobular emphysema (HCC)    Postoperative ileus (HCC)    Right inguinal hernia      SURGICAL HISTORY   Past Surgical History:  Procedure Laterality Date   APPENDECTOMY  02/12/2023   Procedure: APPENDECTOMY;  Surgeon: Carolan Shiver, MD;  Location: [facility] ORS;  Service: General;;   CATARACT EXTRACTION W/ INTRAOCULAR LENS IMPLANT Right 05/18/2013   CATARACT EXTRACTION W/ INTRAOCULAR LENS IMPLANT Left 07/06/2013   COLONOSCOPY  COLONOSCOPY WITH PROPOFOL N/A 01/16/2021   Procedure: COLONOSCOPY WITH PROPOFOL;  Surgeon: Wyline Mood, MD;  Location: [facility] ENDOSCOPY;  Service: Gastroenterology;  Laterality: N/A;  SPANISH INTERPRETER 9 AM ARRIVAL REQUEST   ILEOSTOMY N/A 11/03/2022   Procedure: ILEOSTOMY;  Surgeon: Carolan Shiver, MD;  Location: [facility] ORS;  Service: General;  Laterality: N/A;   ILEOSTOMY CLOSURE N/A 02/12/2023   Procedure: ILEOSTOMY TAKEDOWN;  Surgeon: Carolan Shiver, MD;  Location: [facility] ORS;  Service: General;  Laterality: N/A;   INGUINAL HERNIA REPAIR Right 01/08/2016    LAPAROTOMY N/A 10/20/2022   Procedure: EXPLORATORY LAPAROTOMY,  SIGMOID COLECTOMY, LOOP ILEOSTOMY;  Surgeon: Carolan Shiver, MD;  Location: [facility] ORS;  Service: General;  Laterality: N/A;   LAPAROTOMY N/A 02/15/2023   Procedure: EXPLORATORY LAPAROTOMY;  Surgeon: Carolan Shiver, MD;  Location: [facility] ORS;  Service: General;  Laterality: N/A;   PORTA CATH INSERTION N/A 06/19/2020   Procedure: PORTA CATH INSERTION;  Surgeon: Annice Needy, MD;  Location: [facility] INVASIVE CV LAB;  Service: Cardiovascular;  Laterality: N/A;   TRANSURETHRAL RESECTION OF BLADDER TUMOR WITH GYRUS (TURBT-GYRUS)  07/2020   TRANSURETHRAL RESECTION OF BLADDER TUMOR WITH MITOMYCIN-C N/A 05/22/2020   Procedure: TRANSURETHRAL RESECTION OF BLADDER TUMOR WITH Gemcitabine;  Surgeon: Vanna Scotland, MD;  Location: [facility] ORS;  Service: Urology;  Laterality: N/A;     FAMILY HISTORY   Family History  Problem Relation Age of Onset   Asthma Mother      SOCIAL HISTORY   Social History   Tobacco Use   Smoking status: Former    Current packs/day: 0.00    Average packs/day: 1.5 packs/day for 40.0 years (60.0 ttl pk-yrs)    Types: Cigarettes    Start date: 05/21/1968    Quit date: 05/21/2008    Years since quitting: 14.7    Passive exposure: Past   Smokeless tobacco: Never  Vaping Use   Vaping status: Never Used  Substance Use Topics   Alcohol use: Not Currently   Drug use: Never     MEDICATIONS    Home Medication:    Current Medication:  Current Facility-Administered Medications:    0.9 %  sodium chloride infusion, , Intravenous, Continuous, Cintron-Diaz, Edgardo, MD, Last Rate: 50 mL/hr at 02/18/23 0554, Infusion Verify at 02/18/23 0554   amLODipine (NORVASC) tablet 10 mg, 10 mg, Oral, QHS, Cintron-Diaz, Edgardo, MD, 10 mg at 02/17/23 2041   arformoterol (BROVANA) nebulizer solution 15 mcg, 15 mcg, Nebulization, BID, Karna Christmas, Tarl Cephas, MD, 15 mcg at 02/18/23 0755   budesonide (PULMICORT) nebulizer  solution 0.25 mg, 0.25 mg, Nebulization, BID, Karna Christmas, Laverne Klugh, MD, 0.25 mg at 02/18/23 0755   Chlorhexidine Gluconate Cloth 2 % PADS 6 each, 6 each, Topical, Q0600, Carolan Shiver, MD, 6 each at 02/18/23 0511   enoxaparin (LOVENOX) injection 40 mg, 40 mg, Subcutaneous, Q24H, Cintron-Diaz, Edgardo, MD, 40 mg at 02/18/23 0752   fluticasone (FLONASE) 50 MCG/ACT nasal spray 1 spray, 1 spray, Each Nare, Daily, Cintron-Diaz, Lurline Idol, MD, 1 spray at 02/18/23 0752   hydrochlorothiazide (HYDRODIURIL) tablet 25 mg, 25 mg, Oral, Daily, Cintron-Diaz, Edgardo, MD, 25 mg at 02/18/23 0753   influenza vaccine adjuvanted (FLUAD) injection 0.5 mL, 0.5 mL, Intramuscular, Tomorrow-1000, Cintron-Diaz, Edgardo, MD   iohexol (OMNIPAQUE) 9 MG/ML oral solution 1,000 mL, 1,000 mL, Oral, Once PRN, Carolan Shiver, MD, 1,000 mL at 02/15/23 1455   iohexol (OMNIPAQUE) 9 MG/ML oral solution 500 mL, 500 mL, Per Tube, Q1H, Carolan Shiver, MD   loratadine (CLARITIN) tablet 10 mg, 10 mg, Oral, Daily,  Carolan Shiver, MD, 10 mg at 02/18/23 0754   losartan (COZAAR) tablet 25 mg, 25 mg, Oral, Daily, Cintron-Diaz, Edgardo, MD, 25 mg at 02/18/23 0759   montelukast (SINGULAIR) tablet 10 mg, 10 mg, Oral, QHS, Cintron-Diaz, Edgardo, MD, 10 mg at 02/17/23 2041   morphine (PF) 4 MG/ML injection 4 mg, 4 mg, Intravenous, Q3H PRN, Carolan Shiver, MD, 4 mg at 02/18/23 0542   mupirocin ointment (BACTROBAN) 2 %, , Nasal, BID, Vida Rigger, MD, Given at 02/18/23 0755   pantoprazole (PROTONIX) injection 40 mg, 40 mg, Intravenous, Daily, Tressie Ellis, RPH, 40 mg at 02/18/23 0756   phenol (CHLORASEPTIC) mouth spray 1 spray, 1 spray, Mouth/Throat, PRN, Cintron-Diaz, Edgardo, MD   polyethylene glycol (MIRALAX / GLYCOLAX) packet 17 g, 17 g, Oral, Daily, Cintron-Diaz, Edgardo, MD, 17 g at 02/18/23 0756   potassium chloride SA (KLOR-CON M) CR tablet 20 mEq, 20 mEq, Oral, BID, Cintron-Diaz, Edgardo, MD, 20 mEq at  02/18/23 0756   roflumilast (DALIRESP) tablet 500 mcg, 500 mcg, Oral, Daily, Cintron-Diaz, Lurline Idol, MD, 500 mcg at 02/17/23 0845   tamsulosin (FLOMAX) capsule 0.4 mg, 0.4 mg, Oral, Daily, Cintron-Diaz, Edgardo, MD, 0.4 mg at 02/18/23 0757   tiotropium (SPIRIVA) inhalation capsule ([facility] use ONLY) 18 mcg, 18 mcg, Inhalation, Daily, Cintron-Diaz, Lurline Idol, MD, 18 mcg at 02/17/23 0845   traMADol (ULTRAM) tablet 50 mg, 50 mg, Oral, Q6H PRN, Carolan Shiver, MD, 50 mg at 02/18/23 0759    ALLERGIES   Patient has no known allergies.     REVIEW OF SYSTEMS    Review of Systems:  Gen:  Denies  fever, sweats, chills weigh loss  HEENT: Denies blurred vision, double vision, ear pain, eye pain, hearing loss, nose bleeds, sore throat Cardiac:  No dizziness, chest pain or heaviness, chest tightness,edema Resp:   reports dyspnea chronically  Gi: Denies swallowing difficulty, stomach pain, nausea or vomiting, diarrhea, constipation, bowel incontinence Gu:  Denies bladder incontinence, burning urine Ext:   Denies Joint pain, stiffness or swelling Skin: Denies  skin rash, easy bruising or bleeding or hives Endoc:  Denies polyuria, polydipsia , polyphagia or weight change Psych:   Denies depression, insomnia or hallucinations   Other:  All other systems negative   VS: BP (!) 170/86 (BP Location: Left Arm)   Pulse 100   Temp 98.6 F (37 C) (Oral)   Resp 18   Ht 5\' 7"  (1.702 m)   Wt 64.9 kg   SpO2 93%   BMI 22.40 kg/m      PHYSICAL EXAM    GENERAL:NAD, no fevers, chills, no weakness no fatigue HEAD: Normocephalic, atraumatic.  EYES: Pupils equal, round, reactive to light. Extraocular muscles intact. No scleral icterus.  MOUTH: Moist mucosal membrane. Dentition intact. No abscess noted.  EAR, NOSE, THROAT: Clear without exudates. No external lesions.  NECK: Supple. No thyromegaly. No nodules. No JVD.  PULMONARY: decreased breath sounds with mild rhonchi worse at bases bilaterally.   CARDIOVASCULAR: S1 and S2. Regular rate and rhythm. No murmurs, rubs, or gallops. No edema. Pedal pulses 2+ bilaterally.  GASTROINTESTINAL: Soft, nontender, nondistended. No masses. Positive bowel sounds. No hepatosplenomegaly.  MUSCULOSKELETAL: No swelling, clubbing, or edema. Range of motion full in all extremities.  NEUROLOGIC: Cranial nerves II through XII are intact. No gross focal neurological deficits. Sensation intact. Reflexes intact.  SKIN: No ulceration, lesions, rashes, or cyanosis. Skin warm and dry. Turgor intact.  PSYCHIATRIC: Mood, affect within normal limits. The patient is awake, alert and oriented x 3. Insight,  judgment intact.       IMAGING       Narrative & Impression  CLINICAL DATA:  Abdominal pain, acute, nonlocalized; Pulmonary embolism (PE) suspected, high prob. History of bladder cancer. * Tracking Code: BO *   EXAM: CT ANGIOGRAPHY CHEST   CT ABDOMEN AND PELVIS WITH CONTRAST   TECHNIQUE: Multidetector CT imaging of the chest was performed using the standard protocol during bolus administration of intravenous contrast. Multiplanar CT image reconstructions and MIPs were obtained to evaluate the vascular anatomy. Multidetector CT imaging of the abdomen and pelvis was performed using the standard protocol during bolus administration of intravenous contrast.   RADIATION DOSE REDUCTION: This exam was performed according to the departmental dose-optimization program which includes automated exposure control, adjustment of the mA and/or kV according to patient size and/or use of iterative reconstruction technique.   CONTRAST:  75mL OMNIPAQUE IOHEXOL 350 MG/ML SOLN   COMPARISON:  Chest radiograph from earlier today. 04/24/2022 CT chest, abdomen and pelvis.   FINDINGS: CTA CHEST FINDINGS   Cardiovascular: The study is high quality for the evaluation of pulmonary embolism. There are no filling defects in the central, lobar, segmental or subsegmental  pulmonary artery branches to suggest acute pulmonary embolism. Atherosclerotic nonaneurysmal thoracic aorta. Normal caliber pulmonary arteries. Normal heart size. No significant pericardial fluid/thickening. Three-vessel coronary atherosclerosis. Right internal jugular Port-A-Cath terminates in the lower third of the SVC.   Mediastinum/Nodes: No significant thyroid nodules. Unremarkable esophagus. No pathologically enlarged axillary, mediastinal or hilar lymph nodes.   Lungs/Pleura: No pneumothorax. No pleural effusion. Severe centrilobular emphysema with mild diffuse bronchial wall thickening. No acute consolidative airspace disease or lung masses. Scattered tiny calcified granulomas in the peripheral upper lobes are unchanged. No significant pulmonary nodules.   Musculoskeletal: No aggressive appearing focal osseous lesions. Mild thoracic spondylosis.   Review of the MIP images confirms the above findings.   CT ABDOMEN and PELVIS FINDINGS   Motion degraded scan, limiting assessment.   Hepatobiliary: For normal liver size. Subcentimeter hypodense posteroinferior right liver lesion, too small to characterize, unchanged, presumably benign. No new liver lesions. Normal gallbladder with no radiopaque cholelithiasis. No biliary ductal dilatation.   Pancreas: Normal, with no mass or duct dilation.   Spleen: Normal size. No mass.   Adrenals/Urinary Tract: Normal adrenals. Simple exophytic 1.9 cm renal cyst in the anterior interpolar right kidney with numerous additional subcentimeter hypodense bilateral renal cortical lesions that are too small to characterize, for which no follow-up imaging is recommended. No hydronephrosis. Normal bladder.   Stomach/Bowel: Normal non-distended stomach. Normal caliber small bowel with no small bowel wall thickening. Normal appendix. Marked left colonic diverticulosis, most prominent in the sigmoid colon. There is focal pericolonic fat  stranding and ill-defined free fluid with scattered free air in the region of the mid to distal sigmoid colon in the right pelvis (series 4/image 49), suggesting perforated sigmoid diverticulitis. No discrete measurable/drainable pericolonic abscess.   Vascular/Lymphatic: Atherosclerotic nonaneurysmal abdominal aorta. Patent portal, splenic, hepatic and renal veins. No pathologically enlarged lymph nodes in the abdomen or pelvis.   Reproductive: Mild prostatomegaly.   Other: Scattered small to moderate free air throughout the anterior upper peritoneal cavity (series 4/image 9). No ascites. Tiny chronic fat containing umbilical hernia.   Musculoskeletal: No aggressive appearing focal osseous lesions. Moderate thoracolumbar spondylosis.   Review of the MIP images confirms the above findings.   IMPRESSION: 1. No pulmonary embolism. 2. Small to moderate pneumoperitoneum, suspected to be due to perforated sigmoid diverticulitis. No abscess.  3. No evidence of metastatic disease in the chest, abdomen or pelvis. 4. Three-vessel coronary atherosclerosis. 5. Severe centrilobular emphysema with mild diffuse bronchial wall thickening, suggesting COPD. 6. Mild prostatomegaly. 7. Aortic Atherosclerosis (ICD10-I70.0) and Emphysema (ICD10-J43.9).   Critical Value/emergent results were called by telephone at the time of interpretation on 10/20/2022 at 10:36 am to provider [facility] , who verbally acknowledged these results.     Electronically Signed   By: Delbert Phenix M.D.   On: 10/20/2022 10:38        ASSESSMENT/PLAN   Acute on chronic hypoxemic respiratory failure -most likely due to transient post anesthesia hypoventilation with background of centrilobular emphysmea -patient is being weaned on O2 requirement -will deliver incentive spiromtry to patient -duoneb nebulizer therapy x Q6h prn - no need for steroids at this time -will obtain CXR to evaluate for atelectasis vs  infiltrates -no signs of COVID19 -spO2 goal >88% with goal 2L/min as per home setting -PT/OT when cleared by surgery -Ventolin MDI QID PRN while hospitlized  -bactritracin ointment for nares -continue brovana/pulmicort/dupixent  OSA on CPAP  Patient brought in his CPAP ResMEd device.  -he has NG tube and ideally should wait 1 month to use it so pressure in abdomen is low   Thank you for allowing me to participate in the care of this patient.   Patient/Family are satisfied with care plan and all questions have been answered.    Provider disclosure: Patient with at least one acute or chronic illness or injury that poses a threat to life or bodily function and is being managed actively during this encounter.  All of the below services have been performed independently by signing provider:  review of prior documentation from internal and or external health records.  Review of previous and current lab results.  Interview and comprehensive assessment during patient visit today. Review of current and previous chest radiographs/CT scans. Discussion of management and test interpretation with health care team and patient/family.   This document was prepared using Dragon voice recognition software and may include unintentional dictation errors.     Vida Rigger, M.D.  Division of Pulmonary & Critical Care Medicine

## 2023-02-18 NOTE — Plan of Care (Signed)
  Problem: Education: Goal: Knowledge of General Education information will improve Description: Including pain rating scale, medication(s)/side effects and non-pharmacologic comfort measures 02/18/2023 1610 by Murriel Hopper, Donald Pore, RN Outcome: Progressing 02/18/2023 1606 by Murriel Hopper, Donald Pore, RN Outcome: Progressing   Problem: Health Behavior/Discharge Planning: Goal: Ability to manage health-related needs will improve 02/18/2023 1610 by Murriel Hopper, Donald Pore, RN Outcome: Progressing 02/18/2023 1606 by Murriel Hopper, Donald Pore, RN Outcome: Progressing   Problem: Clinical Measurements: Goal: Ability to maintain clinical measurements within normal limits will improve 02/18/2023 1610 by Murriel Hopper, Donald Pore, RN Outcome: Progressing 02/18/2023 1606 by Murriel Hopper, Donald Pore, RN Outcome: Progressing Goal: Will remain free from infection 02/18/2023 1610 by Murriel Hopper, Donald Pore, RN Outcome: Progressing 02/18/2023 1606 by Murriel Hopper, Donald Pore, RN Outcome: Progressing Goal: Diagnostic test results will improve 02/18/2023 1610 by Monica Becton, RN Outcome: Progressing 02/18/2023 1606 by Murriel Hopper, Donald Pore, RN Outcome: Progressing Goal: Respiratory complications will improve 02/18/2023 1610 by Monica Becton, RN Outcome: Progressing 02/18/2023 1606 by Murriel Hopper, Donald Pore, RN Outcome: Progressing Goal: Cardiovascular complication will be avoided 02/18/2023 1610 by Monica Becton, RN Outcome: Progressing 02/18/2023 1606 by Monica Becton, RN Outcome: Progressing   Problem: Activity: Goal: Risk for activity intolerance will decrease 02/18/2023 1610 by Murriel Hopper, Donald Pore, RN Outcome: Progressing 02/18/2023 1606 by Murriel Hopper, Donald Pore, RN Outcome: Progressing   Problem: Nutrition: Goal: Adequate nutrition will be maintained 02/18/2023 1610 by Murriel Hopper, Donald Pore, RN Outcome: Progressing 02/18/2023 1606 by Murriel Hopper, Donald Pore, RN Outcome: Progressing   Problem: Coping: Goal: Level of anxiety will decrease 02/18/2023 1610 by Murriel Hopper, Donald Pore, RN Outcome: Progressing 02/18/2023 1606 by Murriel Hopper, Donald Pore, RN Outcome: Progressing   Problem: Elimination: Goal: Will not experience complications related to bowel motility 02/18/2023 1610 by Murriel Hopper, Donald Pore, RN Outcome: Progressing 02/18/2023 1606 by Murriel Hopper, Donald Pore, RN Outcome: Progressing Goal: Will not experience complications related to urinary retention 02/18/2023 1610 by Monica Becton, RN Outcome: Progressing 02/18/2023 1606 by Murriel Hopper, Donald Pore, RN Outcome: Progressing   Problem: Pain Managment: Goal: General experience of comfort will improve 02/18/2023 1610 by Murriel Hopper, Donald Pore, RN Outcome: Progressing 02/18/2023 1606 by Murriel Hopper, Donald Pore, RN Outcome: Progressing   Problem: Safety: Goal: Ability to remain free from injury will improve 02/18/2023 1610 by Murriel Hopper, Donald Pore, RN Outcome: Progressing 02/18/2023 1606 by Murriel Hopper, Donald Pore, RN Outcome: Progressing   Problem: Skin Integrity: Goal: Risk for impaired skin integrity will decrease 02/18/2023 1610 by Monica Becton, RN Outcome: Progressing 02/18/2023 1606 by Monica Becton, RN Outcome: Progressing

## 2023-02-18 NOTE — Progress Notes (Signed)
Patient ID: Joe Torres, male   DOB: October 21, 1945, 77 y.o.   MRN: [id number]     SURGICAL PROGRESS NOTE   Hospital Day(s): 6.   Interval History: Patient seen and examined, no acute events or new complaints overnight. Patient reports feeling well.  He endorses he continue bloated.  He denies passing any gas or bowel movement.  Abdominal soreness intermittent and controlled with current pain medications.  Vital signs in last 24 hours: [min-max] current  Temp:  [98.3 F (36.8 C)-99.6 F (37.6 C)] 98.6 F (37 C) (10/08 0314) Pulse Rate:  [99-115] 100 (10/08 0314) Resp:  [18-21] 18 (10/08 0314) BP: (148-170)/(82-92) 170/86 (10/08 0314) SpO2:  [93 %-98 %] 93 % (10/08 0314)     Height: 5\' 7"  (170.2 cm) Weight: 64.9 kg BMI (Calculated): 22.39   Physical Exam:  Constitutional: alert, cooperative and no distress  Respiratory: breathing non-labored at rest  Cardiovascular: regular rate and sinus rhythm  Gastrointestinal: soft, non-tender, but distended and tympanic  Labs:     Latest Ref Rng & Units 02/14/2023    3:26 AM 02/13/2023    3:42 AM 02/03/2023    1:37 PM  CBC  WBC 4.0 - 10.5 K/uL 10.5  15.0  13.2   Hemoglobin 13.0 - 17.0 g/dL 29.5  28.4  13.2   Hematocrit 39.0 - 52.0 % 39.3  38.2  43.3   Platelets 150 - 400 K/uL 240  233  324       Latest Ref Rng & Units 02/16/2023    8:01 AM 02/14/2023    3:26 AM 02/13/2023    3:42 AM  CMP  Glucose 70 - 99 mg/dL 440  102  725   BUN 8 - 23 mg/dL 17  14  12    Creatinine 0.61 - 1.24 mg/dL 3.66  4.40  3.47   Sodium 135 - 145 mmol/L 138  140  139   Potassium 3.5 - 5.1 mmol/L 4.4  4.3  3.8   Chloride 98 - 111 mmol/L 99  104  103   CO2 22 - 32 mmol/L 24  29  28    Calcium 8.9 - 10.3 mg/dL 8.3  8.7  8.7     Imaging studies: No new pertinent imaging studies   Assessment/Plan:  77 y.o. male with history of ileostomy 6 Day Post-Op s/p ileostomy reversal, complicated by pertinent comorbidities including severe COPD.    --Complicated by obstruction  of the anastomosis site.  Status post new large ileocolonic anastomosis postop day #3 -Patient continue with postoperative ileus -Will continue with current management with NGT to low intermittent suction, pain management -Will repeat CT scan of the abdomen and pelvis for evaluation of new anastomosis and compare with previous CT scan where the descending colon was abruptly collapsed at the proximal descending colon. -Will continue recommendations by pulmonology regarding the management of his chronic/severe COPD -Encouraged the patient to ambulate  Gae Gallop, MD

## 2023-02-18 NOTE — Plan of Care (Signed)

## 2023-02-19 MED ORDER — PANTOPRAZOLE SODIUM 40 MG PO TBEC
40.0000 mg | DELAYED_RELEASE_TABLET | Freq: Every day | ORAL | Status: DC
  Administered 2023-02-20 – 2023-02-21 (×2): 40 mg via ORAL
  Filled 2023-02-19 (×2): qty 1

## 2023-02-19 MED ORDER — SODIUM CHLORIDE 0.9% FLUSH
3.0000 mL | Freq: Two times a day (BID) | INTRAVENOUS | Status: DC
  Administered 2023-02-19 – 2023-02-21 (×4): 3 mL via INTRAVENOUS

## 2023-02-19 MED ORDER — ADULT MULTIVITAMIN W/MINERALS CH
1.0000 | ORAL_TABLET | Freq: Every day | ORAL | Status: DC
  Administered 2023-02-20 – 2023-02-21 (×2): 1 via ORAL
  Filled 2023-02-19 (×2): qty 1

## 2023-02-19 MED ORDER — BOOST / RESOURCE BREEZE PO LIQD CUSTOM
1.0000 | Freq: Three times a day (TID) | ORAL | Status: DC
  Administered 2023-02-19 – 2023-02-21 (×5): 1 via ORAL

## 2023-02-19 NOTE — Progress Notes (Signed)
Patient ID: Joe Torres, male   DOB: 12/22/1945, 77 y.o.   MRN: [id number]     SURGICAL PROGRESS NOTE   Hospital Day(s): 7.   Interval History: Patient seen and examined, no acute events or new complaints overnight. Patient reports feeling better.  He endorses that he continue passing a lot of gas and had bowel movement.  He denies any nausea or vomiting despite not having the NGT.  No abdominal pain.  Vital signs in last 24 hours: [min-max] current  Temp:  [98.4 F (36.9 C)-98.6 F (37 C)] 98.4 F (36.9 C) (10/09 0215) Pulse Rate:  [88-102] 88 (10/09 0215) Resp:  [17-18] 17 (10/09 0215) BP: (138-157)/(83-92) 138/83 (10/09 0215) SpO2:  [97 %-100 %] 97 % (10/09 0215)     Height: 5\' 7"  (170.2 cm) Weight: 64.9 kg BMI (Calculated): 22.39   Physical Exam:  Constitutional: alert, cooperative and no distress  Respiratory: breathing non-labored at rest  Cardiovascular: regular rate and sinus rhythm  Gastrointestinal: soft, non-tender, and mild-distended  Labs:     Latest Ref Rng & Units 02/14/2023    3:26 AM 02/13/2023    3:42 AM 02/03/2023    1:37 PM  CBC  WBC 4.0 - 10.5 K/uL 10.5  15.0  13.2   Hemoglobin 13.0 - 17.0 g/dL 51.7  61.6  07.3   Hematocrit 39.0 - 52.0 % 39.3  38.2  43.3   Platelets 150 - 400 K/uL 240  233  324       Latest Ref Rng & Units 02/16/2023    8:01 AM 02/14/2023    3:26 AM 02/13/2023    3:42 AM  CMP  Glucose 70 - 99 mg/dL 710  626  948   BUN 8 - 23 mg/dL 17  14  12    Creatinine 0.61 - 1.24 mg/dL 5.46  2.70  3.50   Sodium 135 - 145 mmol/L 138  140  139   Potassium 3.5 - 5.1 mmol/L 4.4  4.3  3.8   Chloride 98 - 111 mmol/L 99  104  103   CO2 22 - 32 mmol/L 24  29  28    Calcium 8.9 - 10.3 mg/dL 8.3  8.7  8.7     Imaging studies: No new pertinent imaging studies   Assessment/Plan:  77 y.o. male with history of ileostomy 7 Day Post-Op s/p ileostomy reversal, complicated by pertinent comorbidities including severe COPD.    -Complicated by obstruction of the  anastomosis site.  Status post new large ileocolonic anastomosis postop day #4 -Finally having adequate progress.  Patient continued passing flatus and having bowel movement.  Will start liquid diet and assess for toleration. -Continue pain management -Encourage patient to ambulate -Will continue following recommendation from pulmonology for his severe COPD  Gae Gallop, MD

## 2023-02-19 NOTE — Progress Notes (Signed)
Yellow MEWS score interventions initiated. MD notified.   02/19/23 0800  Assess: MEWS Score  Temp 97.7 F (36.5 C)  BP 98/75  MAP (mmHg) 85  Pulse Rate (!) 107  Resp (!) 21  SpO2 98 %  O2 Device Nasal Cannula  O2 Flow Rate (L/min) 3 L/min  Assess: MEWS Score  MEWS Temp 0  MEWS Systolic 1  MEWS Pulse 1  MEWS RR 1  MEWS LOC 0  MEWS Score 3  MEWS Score Color Yellow  Assess: if the MEWS score is Yellow or Red  Were vital signs accurate and taken at a resting state? Yes  Does the patient meet 2 or more of the SIRS criteria? Yes  Does the patient have a confirmed or suspected source of infection? No  MEWS guidelines implemented  Yes, yellow  Treat  MEWS Interventions Considered administering scheduled or prn medications/treatments as ordered  Take Vital Signs  Increase Vital Sign Frequency  Yellow: Q2hr x1, continue Q4hrs until patient remains green for 12hrs  Escalate  MEWS: Escalate Yellow: Discuss with charge nurse and consider notifying provider and/or RRT  Notify: Charge Nurse/RN  Name of Charge Nurse/RN Notified Alle RN  Provider Notification  Provider Name/Title Dr Hazle Quant  Date Provider Notified 02/19/23  Time Provider Notified 0911  Method of Notification Page  Notification Reason Other (Comment) (MEWS)  Notify: Rapid Response  Name of Rapid Response RN Notified n/a  Assess: SIRS CRITERIA  SIRS Temperature  0  SIRS Pulse 1  SIRS Respirations  1  SIRS WBC 0  SIRS Score Sum  2

## 2023-02-19 NOTE — Progress Notes (Signed)
PULMONOLOGY         Date: 02/19/2023,   MRN# [id number] Joe Torres 01/24/1946     AdmissionWeight: 64.9 kg                 CurrentWeight: 64.9 kg  Referring provider: Dr. 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  Also has a background history of bladder cancer, BPH, cataracts, diverticulitis status post surgical evaluation and findings of perforated diverticulitis.  Patient had underwent partial colectomy with anastomosis and protective loop ileostomy.  Postoperatively patient was noted to have acute on chronic hypoxemia requiring 6 L/min nasal cannula.  Neri consultation placed for management of advanced COPD with chronic hypoxemia.   02/13/23- patient with no acute events overnight.  Wife at bedside.  He is on home setting of O2 requirement.  He is smiling during interview.  He had CPAP overnight without issues. He is not coughing. We dicussed follow up outpatient for continuity of care to optimize his COPD, chronic hypoxemia and OSA.  Patient is established with [facility] pulmonology and agrees to follow up with his previous lung team.  We have provided our contact information in case his pulmonogists wish to review his care while inpatient. Mr Shines is cleared for dc home from pulmonary perspective.  Upon Dc he does not require prednisone or pulmicort or brovana or aformeterol.  He should have Daliresp daily, Spiriva once daily.  02/14/23- patient is up today on his feet.  His son is present during interview and examination.  He has not had BM yet but is urinating well. Respiratory status is stable, patient is in good spirits close to baseline.  Will continue to monitor peripherally , his blood work is  excellent with complete resolution of leucosytosis and normal renal function   02/17/23- patient evaluated at bedside, reports no new issues.  He has blood around nares from skin breakdown due to pressure injury from nasal canula.  Bacitracin ointment has been ordered.  Discussed with daughter at bedside.   02/18/23- patient resting in bed. He is on 3L/min .  He is due for dupixent today.  We have restarted his brovana and pulmicort.  He is ambulatory   02/19/23- patient had CT abd with good report.  He had large BM feels better.  He is on dupixent no issues yesterday, injected in abdomen.  He is off cpap.  Pulmonary status is stable   PAST MEDICAL HISTORY   Past Medical History:  Diagnosis Date   Arthritis    Asthma    Bladder cancer (HCC)    BPH (benign prostatic hyperplasia)    Cancer (HCC)    Cataracts, bilateral    COPD, severe (HCC)    Diverticulitis of large intestine with perforation without bleeding    Dyspnea    GERD (gastroesophageal reflux disease)    Hyperlipidemia    Hypertension    Obstructive sleep apnea    C-pap with oxygen   Osteoarthritis    Panlobular emphysema (HCC)    Postoperative ileus (HCC)    Right inguinal hernia      SURGICAL HISTORY   Past Surgical History:  Procedure Laterality Date   APPENDECTOMY  02/12/2023   Procedure:  APPENDECTOMY;  Surgeon: Carolan Shiver, MD;  Location: [facility] ORS;  Service: General;;   CATARACT EXTRACTION W/ INTRAOCULAR LENS IMPLANT Right 05/18/2013   CATARACT EXTRACTION W/ INTRAOCULAR LENS IMPLANT Left 07/06/2013   COLONOSCOPY     COLONOSCOPY WITH PROPOFOL N/A 01/16/2021   Procedure: COLONOSCOPY WITH PROPOFOL;  Surgeon: Wyline Mood, MD;  Location: [facility] ENDOSCOPY;  Service: Gastroenterology;  Laterality: N/A;  SPANISH INTERPRETER 9 AM ARRIVAL REQUEST   ILEOSTOMY N/A 11/03/2022   Procedure: ILEOSTOMY;  Surgeon: Carolan Shiver, MD;  Location: [facility] ORS;  Service: General;  Laterality: N/A;   ILEOSTOMY CLOSURE  N/A 02/12/2023   Procedure: ILEOSTOMY TAKEDOWN;  Surgeon: Carolan Shiver, MD;  Location: [facility] ORS;  Service: General;  Laterality: N/A;   INGUINAL HERNIA REPAIR Right 01/08/2016   LAPAROTOMY N/A 10/20/2022   Procedure: EXPLORATORY LAPAROTOMY,  SIGMOID COLECTOMY, LOOP ILEOSTOMY;  Surgeon: Carolan Shiver, MD;  Location: [facility] ORS;  Service: General;  Laterality: N/A;   LAPAROTOMY N/A 02/15/2023   Procedure: EXPLORATORY LAPAROTOMY;  Surgeon: Carolan Shiver, MD;  Location: [facility] ORS;  Service: General;  Laterality: N/A;   PORTA CATH INSERTION N/A 06/19/2020   Procedure: PORTA CATH INSERTION;  Surgeon: Annice Needy, MD;  Location: [facility] INVASIVE CV LAB;  Service: Cardiovascular;  Laterality: N/A;   TRANSURETHRAL RESECTION OF BLADDER TUMOR WITH GYRUS (TURBT-GYRUS)  07/2020   TRANSURETHRAL RESECTION OF BLADDER TUMOR WITH MITOMYCIN-C N/A 05/22/2020   Procedure: TRANSURETHRAL RESECTION OF BLADDER TUMOR WITH Gemcitabine;  Surgeon: Vanna Scotland, MD;  Location: [facility] ORS;  Service: Urology;  Laterality: N/A;     FAMILY HISTORY   Family History  Problem Relation Age of Onset   Asthma Mother      SOCIAL HISTORY   Social History   Tobacco Use   Smoking status: Former    Current packs/day: 0.00    Average packs/day: 1.5 packs/day for 40.0 years (60.0 ttl pk-yrs)    Types: Cigarettes    Start date: 05/21/1968    Quit date: 05/21/2008    Years since quitting: 14.7    Passive exposure: Past   Smokeless tobacco: Never  Vaping Use   Vaping status: Never Used  Substance Use Topics   Alcohol use: Not Currently   Drug use: Never     MEDICATIONS    Home Medication:    Current Medication:  Current Facility-Administered Medications:    amLODipine (NORVASC) tablet 10 mg, 10 mg, Oral, QHS, Cintron-Diaz, Edgardo, MD, 10 mg at 02/18/23 2136   arformoterol (BROVANA) nebulizer solution 15 mcg, 15 mcg, Nebulization, BID, Karna Christmas, Mariyam Remington, MD, 15 mcg at 02/19/23 0747   budesonide  (PULMICORT) nebulizer solution 0.25 mg, 0.25 mg, Nebulization, BID, Karna Christmas, Hillis Mcphatter, MD, 0.25 mg at 02/19/23 0747   Chlorhexidine Gluconate Cloth 2 % PADS 6 each, 6 each, Topical, Q0600, Carolan Shiver, MD, 6 each at 02/18/23 0511   Dupilumab SOPN 300 mg, 300 mg, Subcutaneous, Q14 Days, Cintron-Diaz, Edgardo, MD, 300 mg at 02/18/23 1047   enoxaparin (LOVENOX) injection 40 mg, 40 mg, Subcutaneous, Q24H, Cintron-Diaz, Edgardo, MD, 40 mg at 02/18/23 0752   fluticasone (FLONASE) 50 MCG/ACT nasal spray 1 spray, 1 spray, Each Nare, Daily, Cintron-Diaz, Lurline Idol, MD, 1 spray at 02/18/23 0752   hydrochlorothiazide (HYDRODIURIL) tablet 25 mg, 25 mg, Oral, Daily, Cintron-Diaz, Edgardo, MD, 25 mg at 02/18/23 0753   influenza vaccine adjuvanted (FLUAD) injection 0.5 mL, 0.5 mL, Intramuscular, Tomorrow-1000, Cintron-Diaz, Edgardo, MD   iohexol (OMNIPAQUE) 9 MG/ML oral solution 1,000 mL, 1,000 mL, Oral, Once PRN, Carolan Shiver, MD, 1,000  mL at 02/15/23 1455   loratadine (CLARITIN) tablet 10 mg, 10 mg, Oral, Daily, Cintron-Diaz, Edgardo, MD, 10 mg at 02/18/23 0754   losartan (COZAAR) tablet 25 mg, 25 mg, Oral, Daily, Cintron-Diaz, Edgardo, MD, 25 mg at 02/18/23 0759   montelukast (SINGULAIR) tablet 10 mg, 10 mg, Oral, QHS, Cintron-Diaz, Edgardo, MD, 10 mg at 02/18/23 2136   morphine (PF) 4 MG/ML injection 4 mg, 4 mg, Intravenous, Q3H PRN, Carolan Shiver, MD, 4 mg at 02/19/23 0443   mupirocin ointment (BACTROBAN) 2 %, , Nasal, BID, Vida Rigger, MD, Given at 02/18/23 2141   pantoprazole (PROTONIX) injection 40 mg, 40 mg, Intravenous, Daily, Tressie Ellis, RPH, 40 mg at 02/18/23 0756   phenol (CHLORASEPTIC) mouth spray 1 spray, 1 spray, Mouth/Throat, PRN, Cintron-Diaz, Edgardo, MD   polyethylene glycol (MIRALAX / GLYCOLAX) packet 17 g, 17 g, Oral, Daily, Cintron-Diaz, Edgardo, MD, 17 g at 02/18/23 0756   potassium chloride SA (KLOR-CON M) CR tablet 20 mEq, 20 mEq, Oral, BID, Cintron-Diaz,  Edgardo, MD, 20 mEq at 02/18/23 2137   roflumilast (DALIRESP) tablet 500 mcg, 500 mcg, Oral, Daily, Cintron-Diaz, Lurline Idol, MD, 500 mcg at 02/18/23 1238   sodium chloride flush (NS) 0.9 % injection 3 mL, 3 mL, Intravenous, Q12H, Cintron-Diaz, Edgardo, MD   tamsulosin (FLOMAX) capsule 0.4 mg, 0.4 mg, Oral, Daily, Cintron-Diaz, Edgardo, MD, 0.4 mg at 02/18/23 0757   tiotropium (SPIRIVA) inhalation capsule ([facility] use ONLY) 18 mcg, 18 mcg, Inhalation, Daily, Cintron-Diaz, Edgardo, MD, 18 mcg at 02/18/23 1013   traMADol (ULTRAM) tablet 50 mg, 50 mg, Oral, Q6H PRN, Carolan Shiver, MD, 50 mg at 02/18/23 0759    ALLERGIES   Patient has no known allergies.     REVIEW OF SYSTEMS    Review of Systems:  Gen:  Denies  fever, sweats, chills weigh loss  HEENT: Denies blurred vision, double vision, ear pain, eye pain, hearing loss, nose bleeds, sore throat Cardiac:  No dizziness, chest pain or heaviness, chest tightness,edema Resp:   reports dyspnea chronically  Gi: Denies swallowing difficulty, stomach pain, nausea or vomiting, diarrhea, constipation, bowel incontinence Gu:  Denies bladder incontinence, burning urine Ext:   Denies Joint pain, stiffness or swelling Skin: Denies  skin rash, easy bruising or bleeding or hives Endoc:  Denies polyuria, polydipsia , polyphagia or weight change Psych:   Denies depression, insomnia or hallucinations   Other:  All other systems negative   VS: BP 138/83 (BP Location: Left Arm)   Pulse 88   Temp 98.4 F (36.9 C) (Oral)   Resp 17   Ht 5\' 7"  (1.702 m)   Wt 64.9 kg   SpO2 97%   BMI 22.40 kg/m      PHYSICAL EXAM    GENERAL:NAD, no fevers, chills, no weakness no fatigue HEAD: Normocephalic, atraumatic.  EYES: Pupils equal, round, reactive to light. Extraocular muscles intact. No scleral icterus.  MOUTH: Moist mucosal membrane. Dentition intact. No abscess noted.  EAR, NOSE, THROAT: Clear without exudates. No external lesions.  NECK:  Supple. No thyromegaly. No nodules. No JVD.  PULMONARY: decreased breath sounds with mild rhonchi worse at bases bilaterally.  CARDIOVASCULAR: S1 and S2. Regular rate and rhythm. No murmurs, rubs, or gallops. No edema. Pedal pulses 2+ bilaterally.  GASTROINTESTINAL: Soft, nontender, nondistended. No masses. Positive bowel sounds. No hepatosplenomegaly.  MUSCULOSKELETAL: No swelling, clubbing, or edema. Range of motion full in all extremities.  NEUROLOGIC: Cranial nerves II through XII are intact. No gross focal neurological deficits. Sensation intact. Reflexes  intact.  SKIN: No ulceration, lesions, rashes, or cyanosis. Skin warm and dry. Turgor intact.  PSYCHIATRIC: Mood, affect within normal limits. The patient is awake, alert and oriented x 3. Insight, judgment intact.       IMAGING       Narrative & Impression  CLINICAL DATA:  Abdominal pain, acute, nonlocalized; Pulmonary embolism (PE) suspected, high prob. History of bladder cancer. * Tracking Code: BO *   EXAM: CT ANGIOGRAPHY CHEST   CT ABDOMEN AND PELVIS WITH CONTRAST   TECHNIQUE: Multidetector CT imaging of the chest was performed using the standard protocol during bolus administration of intravenous contrast. Multiplanar CT image reconstructions and MIPs were obtained to evaluate the vascular anatomy. Multidetector CT imaging of the abdomen and pelvis was performed using the standard protocol during bolus administration of intravenous contrast.   RADIATION DOSE REDUCTION: This exam was performed according to the departmental dose-optimization program which includes automated exposure control, adjustment of the mA and/or kV according to patient size and/or use of iterative reconstruction technique.   CONTRAST:  75mL OMNIPAQUE IOHEXOL 350 MG/ML SOLN   COMPARISON:  Chest radiograph from earlier today. 04/24/2022 CT chest, abdomen and pelvis.   FINDINGS: CTA CHEST FINDINGS   Cardiovascular: The study is high  quality for the evaluation of pulmonary embolism. There are no filling defects in the central, lobar, segmental or subsegmental pulmonary artery branches to suggest acute pulmonary embolism. Atherosclerotic nonaneurysmal thoracic aorta. Normal caliber pulmonary arteries. Normal heart size. No significant pericardial fluid/thickening. Three-vessel coronary atherosclerosis. Right internal jugular Port-A-Cath terminates in the lower third of the SVC.   Mediastinum/Nodes: No significant thyroid nodules. Unremarkable esophagus. No pathologically enlarged axillary, mediastinal or hilar lymph nodes.   Lungs/Pleura: No pneumothorax. No pleural effusion. Severe centrilobular emphysema with mild diffuse bronchial wall thickening. No acute consolidative airspace disease or lung masses. Scattered tiny calcified granulomas in the peripheral upper lobes are unchanged. No significant pulmonary nodules.   Musculoskeletal: No aggressive appearing focal osseous lesions. Mild thoracic spondylosis.   Review of the MIP images confirms the above findings.   CT ABDOMEN and PELVIS FINDINGS   Motion degraded scan, limiting assessment.   Hepatobiliary: For normal liver size. Subcentimeter hypodense posteroinferior right liver lesion, too small to characterize, unchanged, presumably benign. No new liver lesions. Normal gallbladder with no radiopaque cholelithiasis. No biliary ductal dilatation.   Pancreas: Normal, with no mass or duct dilation.   Spleen: Normal size. No mass.   Adrenals/Urinary Tract: Normal adrenals. Simple exophytic 1.9 cm renal cyst in the anterior interpolar right kidney with numerous additional subcentimeter hypodense bilateral renal cortical lesions that are too small to characterize, for which no follow-up imaging is recommended. No hydronephrosis. Normal bladder.   Stomach/Bowel: Normal non-distended stomach. Normal caliber small bowel with no small bowel wall thickening.  Normal appendix. Marked left colonic diverticulosis, most prominent in the sigmoid colon. There is focal pericolonic fat stranding and ill-defined free fluid with scattered free air in the region of the mid to distal sigmoid colon in the right pelvis (series 4/image 49), suggesting perforated sigmoid diverticulitis. No discrete measurable/drainable pericolonic abscess.   Vascular/Lymphatic: Atherosclerotic nonaneurysmal abdominal aorta. Patent portal, splenic, hepatic and renal veins. No pathologically enlarged lymph nodes in the abdomen or pelvis.   Reproductive: Mild prostatomegaly.   Other: Scattered small to moderate free air throughout the anterior upper peritoneal cavity (series 4/image 9). No ascites. Tiny chronic fat containing umbilical hernia.   Musculoskeletal: No aggressive appearing focal osseous lesions. Moderate thoracolumbar spondylosis.  Review of the MIP images confirms the above findings.   IMPRESSION: 1. No pulmonary embolism. 2. Small to moderate pneumoperitoneum, suspected to be due to perforated sigmoid diverticulitis. No abscess. 3. No evidence of metastatic disease in the chest, abdomen or pelvis. 4. Three-vessel coronary atherosclerosis. 5. Severe centrilobular emphysema with mild diffuse bronchial wall thickening, suggesting COPD. 6. Mild prostatomegaly. 7. Aortic Atherosclerosis (ICD10-I70.0) and Emphysema (ICD10-J43.9).   Critical Value/emergent results were called by telephone at the time of interpretation on 10/20/2022 at 10:36 am to provider [facility] , who verbally acknowledged these results.     Electronically Signed   By: Delbert Phenix M.D.   On: 10/20/2022 10:38        ASSESSMENT/PLAN   Acute on chronic hypoxemic respiratory failure -most likely due to transient post anesthesia hypoventilation with background of centrilobular emphysmea -patient is being weaned on O2 requirement -will deliver incentive spiromtry to  patient -duoneb nebulizer therapy x Q6h prn - no need for steroids at this time -will obtain CXR to evaluate for atelectasis vs infiltrates -no signs of COVID19 -spO2 goal >88% with goal 2L/min as per home setting -PT/OT when cleared by surgery -Ventolin MDI QID PRN while hospitlized  -bactritracin ointment for nares -continue brovana/pulmicort/dupixent  OSA on CPAP  Patient brought in his CPAP ResMEd device.  -he has NG tube and ideally should wait 1 month to use it so pressure in abdomen is low   Thank you for allowing me to participate in the care of this patient.   Patient/Family are satisfied with care plan and all questions have been answered.    Provider disclosure: Patient with at least one acute or chronic illness or injury that poses a threat to life or bodily function and is being managed actively during this encounter.  All of the below services have been performed independently by signing provider:  review of prior documentation from internal and or external health records.  Review of previous and current lab results.  Interview and comprehensive assessment during patient visit today. Review of current and previous chest radiographs/CT scans. Discussion of management and test interpretation with health care team and patient/family.   This document was prepared using Dragon voice recognition software and may include unintentional dictation errors.     Vida Rigger, M.D.  Division of Pulmonary & Critical Care Medicine

## 2023-02-19 NOTE — Progress Notes (Signed)
Initial Nutrition Assessment  DOCUMENTATION CODES:   Non-severe (moderate) malnutrition in context of chronic illness  INTERVENTION:   Boost Breeze po TID, each supplement provides 250 kcal and 9 grams of protein  Ensure Enlive po TID with diet advancement, each supplement provides 350 kcal and 20 grams of protein.  MVI po daily   Pt at high refeed risk; recommend monitor potassium, magnesium and phosphorus labs daily until stable  Weights   NUTRITION DIAGNOSIS:   Moderate Malnutrition related to chronic illness as evidenced by moderate fat depletion, moderate muscle depletion.  GOAL:   Patient will meet greater than or equal to 90% of their needs  MONITOR:   PO intake, Supplement acceptance, Diet advancement, Weight trends, Labs, I & O's, Skin  REASON FOR ASSESSMENT:   NPO/Clear Liquid Diet    ASSESSMENT:   77 y/o male with h/o BPH, chronic pain, GERD, HLD, OSA, inguinal hernia, COPD, HTN, bladder cancer and perforated diverticulitis (s/p low anterior resection with primary anastomosis & diverting loop ileostomy creation 10/20/22) and (s/p Ileostomy revision and conversion from loop ileostomy to double barrel ileostomy 11/03/2022) and who is now s/p small bowel resection with closure of loop ileostomy & appendectomy 02/12/23 complicated by bowel obstruction s/p partial colectomy with removal of terminal ileum (5.8cm) and anastomosis 10/5.  Met with pt in room today. Pt reports that he is feeling better today. Pt has remained NPO since admit and is now without adequate nutrition for 7 days. Pt initiated on a clear liquid diet today. Pt reports that his appetite is good. Pt reports eating some broth, juice and a popsicle for breakfast. Pt has ordered his lunch. Pt denies any recent weight loss; per chart, pt appears weight stable at baseline. RD discussed with pt the importance of adequate nutrition needed to preserve lean muscle and to support post op healing. Pt is willing to  drink supplements in hospital. Pt is at high refeed risk. Pt s/p resection of his terminal ileum; will check vitamin B12 level.    Medications reviewed and include: lovenox, hydrochlorothiazide, protonix, miralax, KCl  Labs reviewed: K 4.4 wnl, P 4.9(H), Mg 2.1 wnl  NUTRITION - FOCUSED PHYSICAL EXAM:  Flowsheet Row Most Recent Value  Orbital Region No depletion  Upper Arm Region Moderate depletion  Thoracic and Lumbar Region Mild depletion  Buccal Region No depletion  Temple Region No depletion  Clavicle Bone Region Moderate depletion  Clavicle and Acromion Bone Region Moderate depletion  Scapular Bone Region Mild depletion  Dorsal Hand Mild depletion  Patellar Region Moderate depletion  Anterior Thigh Region Moderate depletion  Posterior Calf Region Moderate depletion  Edema (RD Assessment) None  Hair Reviewed  Eyes Reviewed  Mouth Reviewed  Skin Reviewed  Nails Reviewed   Diet Order:   Diet Order             Diet clear liquid Fluid consistency: Thin  Diet effective now                  EDUCATION NEEDS:   Education needs have been addressed  Skin:  Skin Assessment: Reviewed RN Assessment (incision abdomen)  Last BM:  10/8- type 7  Height:   Ht Readings from Last 1 Encounters:  02/12/23 5\' 7"  (1.702 m)    Weight:   Wt Readings from Last 1 Encounters:  02/12/23 64.9 kg    Ideal Body Weight:  67 kg  BMI:  Body mass index is 22.4 kg/m.  Estimated Nutritional Needs:   Kcal:  1800-2100kcal/day  Protein:  90-105g/day  Fluid:  1.7-2.0L/day  Betsey Holiday MS, RD, LDN Please refer to [facility] for RD and/or RD on-call/weekend/after hours pager

## 2023-02-20 LAB — BASIC METABOLIC PANEL
Anion gap: 11 (ref 5–15)
BUN: 8 mg/dL (ref 8–23)
CO2: 33 mmol/L — ABNORMAL HIGH (ref 22–32)
Calcium: 8.5 mg/dL — ABNORMAL LOW (ref 8.9–10.3)
Chloride: 92 mmol/L — ABNORMAL LOW (ref 98–111)
Creatinine, Ser: 0.62 mg/dL (ref 0.61–1.24)
GFR, Estimated: >60 mL/min (ref >60)
Glucose, Bld: 122 mg/dL — ABNORMAL HIGH (ref 70–99)
Potassium: 2.8 mmol/L — ABNORMAL LOW (ref 3.5–5.1)
Sodium: 136 mmol/L (ref 135–145)

## 2023-02-20 LAB — PHOSPHORUS: Phosphorus: 3.8 mg/dL (ref 2.5–4.6)

## 2023-02-20 LAB — MAGNESIUM: Magnesium: 1.8 mg/dL (ref 1.7–2.4)

## 2023-02-20 LAB — VITAMIN B12: Vitamin B-12: 555 pg/mL (ref 180–914)

## 2023-02-20 MED ORDER — POTASSIUM CHLORIDE CRYS ER 20 MEQ PO TBCR
40.0000 meq | EXTENDED_RELEASE_TABLET | Freq: Once | ORAL | Status: AC
  Administered 2023-02-20: 40 meq via ORAL
  Filled 2023-02-20: qty 2

## 2023-02-20 MED ORDER — ORAL CARE MOUTH RINSE: 15.0000 mL | OROMUCOSAL | Status: DC | PRN

## 2023-02-20 MED ORDER — MAGNESIUM SULFATE 2 GM/50ML IV SOLN
2.0000 g | Freq: Once | INTRAVENOUS | Status: AC
  Administered 2023-02-20: 2 g via INTRAVENOUS
  Filled 2023-02-20: qty 50

## 2023-02-20 NOTE — Consult Note (Signed)
PHARMACY CONSULT NOTE - ELECTROLYTES  Pharmacy Consult for Electrolyte Monitoring and Replacement   Recent Labs: Potassium (mmol/L)  Date Value  02/20/2023 2.8 (L)   Magnesium (mg/dL)  Date Value  16/02/9603 1.8   Calcium (mg/dL)  Date Value  54/01/8118 8.5 (L)   Albumin (g/dL)  Date Value  14/78/2956 3.6   Phosphorus (mg/dL)  Date Value  21/30/8657 3.8   Sodium (mmol/L)  Date Value  02/20/2023 136    Height: 5\' 7"  (170.2 cm) Weight: 64 kg (141 lb 1.5 oz) IBW/kg (Calculated) : 66.1 Estimated Creatinine Clearance: 72.2 mL/min (by C-G formula based on SCr of 0.62 mg/dL).  Assessment  Joe Torres is a 77 y.o. male presenting with ileostomy reversal. PMH significant for severe COPD. Pharmacy has been consulted to monitor and replace electrolytes.  Diet: Full liquid MIVF: N/A Pertinent medications: hydrochlorothiazide, Kcl 20 mEq BID  Goal of Therapy: Electrolytes within normal limits  Plan:  K 2.8, continue Kcl 20 mEq BID and give additional 40 mEq x 1 dose today Mg 1.8, magnesium sulfate 2 g IV x 1  Thank you for allowing pharmacy to be a part of this patient's care.  Tressie Ellis 02/20/2023 9:32 AM

## 2023-02-20 NOTE — Plan of Care (Signed)

## 2023-02-20 NOTE — Progress Notes (Signed)
Patient ID: Joe Torres, male   DOB: [birth year], 77 y.o.   MRN: [id number]     SURGICAL PROGRESS NOTE   Hospital Day(s): 8.   Interval History: Patient seen and examined, no acute events or new complaints overnight. Patient reports feeling better.  He endorses that he continue passing gas.  Endorses having bowel movement.  Denies abdominal pain.  Denies any nausea or vomiting.  Vital signs in last 24 hours: [min-max] current  Temp:  [98.2 F (36.8 C)-98.9 F (37.2 C)] 98.4 F (36.9 C) (10/10 0857) Pulse Rate:  [92-110] 110 (10/10 0857) Resp:  [18-20] 18 (10/10 0857) BP: (104-129)/(73-90) 128/90 (10/10 0857) SpO2:  [95 %-100 %] 100 % (10/10 0857) Weight:  [64 kg] 64 kg (10/10 0500)     Height: 5\' 7"  (170.2 cm) Weight: 64 kg BMI (Calculated): 22.09   Physical Exam:  Constitutional: alert, cooperative and no distress  Respiratory: breathing non-labored at rest  Cardiovascular: regular rate and sinus rhythm  Gastrointestinal: soft, non-tender, and non-distended  Labs:     Latest Ref Rng & Units 02/14/2023    3:26 AM 02/13/2023    3:42 AM 02/03/2023    1:37 PM  CBC  WBC 4.0 - 10.5 K/uL 10.5  15.0  13.2   Hemoglobin 13.0 - 17.0 g/dL 40.9  81.1  91.4   Hematocrit 39.0 - 52.0 % 39.3  38.2  43.3   Platelets 150 - 400 K/uL 240  233  324       Latest Ref Rng & Units 02/20/2023    6:32 AM 02/16/2023    8:01 AM 02/14/2023    3:26 AM  CMP  Glucose 70 - 99 mg/dL 782  956  213   BUN 8 - 23 mg/dL 8  17  14    Creatinine 0.61 - 1.24 mg/dL 0.86  5.78  4.69   Sodium 135 - 145 mmol/L 136  138  140   Potassium 3.5 - 5.1 mmol/L 2.8  4.4  4.3   Chloride 98 - 111 mmol/L 92  99  104   CO2 22 - 32 mmol/L 33  24  29   Calcium 8.9 - 10.3 mg/dL 8.5  8.3  8.7     Imaging studies: No new pertinent imaging studies   Assessment/Plan:  77 y.o. male with history of ileostomy 8 Day Post-Op s/p ileostomy reversal, complicated by pertinent comorbidities including severe COPD.    -Complicated by  obstruction of the anastomosis site.  Status post new large ileocolonic anastomosis postop day #5 -Continue adequate progress.  Stable vital signs -Continue passing gas.  Advance to full liquids and assess for toleration -Encouraged the patient to ambulate   Gae Gallop, MD

## 2023-02-21 LAB — BASIC METABOLIC PANEL
Anion gap: 10 (ref 5–15)
BUN: 12 mg/dL (ref 8–23)
CO2: 35 mmol/L — ABNORMAL HIGH (ref 22–32)
Calcium: 8.4 mg/dL — ABNORMAL LOW (ref 8.9–10.3)
Chloride: 94 mmol/L — ABNORMAL LOW (ref 98–111)
Creatinine, Ser: 0.69 mg/dL (ref 0.61–1.24)
GFR, Estimated: >60 mL/min (ref >60)
Glucose, Bld: 125 mg/dL — ABNORMAL HIGH (ref 70–99)
Potassium: 3.5 mmol/L (ref 3.5–5.1)
Sodium: 139 mmol/L (ref 135–145)

## 2023-02-21 MED ORDER — POTASSIUM CHLORIDE CRYS ER 20 MEQ PO TBCR
40.0000 meq | EXTENDED_RELEASE_TABLET | Freq: Once | ORAL | Status: AC
  Administered 2023-02-21: 40 meq via ORAL
  Filled 2023-02-21: qty 2

## 2023-02-21 MED ORDER — TRAMADOL HCL 50 MG PO TABS
50.0000 mg | ORAL_TABLET | Freq: Four times a day (QID) | ORAL | 0 refills | Status: DC | PRN
Start: 2023-02-21 — End: 2023-05-27

## 2023-02-21 NOTE — Plan of Care (Signed)
The patient has been discharged. IV has been removed. Education has been completed with the patient and family at the bedside. Wound care education completed with son.  Problem: Education: Goal: Knowledge of General Education information will improve Description: Including pain rating scale, medication(s)/side effects and non-pharmacologic comfort measures Outcome: Completed/Met   Problem: Health Behavior/Discharge Planning: Goal: Ability to manage health-related needs will improve Outcome: Completed/Met   Problem: Clinical Measurements: Goal: Ability to maintain clinical measurements within normal limits will improve Outcome: Completed/Met Goal: Will remain free from infection Outcome: Completed/Met Goal: Diagnostic test results will improve Outcome: Completed/Met Goal: Respiratory complications will improve Outcome: Completed/Met Goal: Cardiovascular complication will be avoided Outcome: Completed/Met   Problem: Activity: Goal: Risk for activity intolerance will decrease Outcome: Completed/Met   Problem: Nutrition: Goal: Adequate nutrition will be maintained Outcome: Completed/Met   Problem: Coping: Goal: Level of anxiety will decrease Outcome: Completed/Met   Problem: Elimination: Goal: Will not experience complications related to bowel motility Outcome: Completed/Met Goal: Will not experience complications related to urinary retention Outcome: Completed/Met   Problem: Pain Managment: Goal: General experience of comfort will improve Outcome: Completed/Met   Problem: Safety: Goal: Ability to remain free from injury will improve Outcome: Completed/Met   Problem: Skin Integrity: Goal: Risk for impaired skin integrity will decrease Outcome: Completed/Met   Problem: Malnutrition  (NI-5.2) Goal: Food and/or nutrient delivery Description: Individualized approach for food/nutrient provision. Outcome: Completed/Met

## 2023-02-21 NOTE — Consult Note (Signed)
PHARMACY CONSULT NOTE - ELECTROLYTES  Pharmacy Consult for Electrolyte Monitoring and Replacement   Recent Labs: Potassium (mmol/L)  Date Value  02/21/2023 3.5   Magnesium (mg/dL)  Date Value  29/52/8413 1.8   Calcium (mg/dL)  Date Value  24/40/1027 8.4 (L)   Albumin (g/dL)  Date Value  25/36/6440 3.6   Phosphorus (mg/dL)  Date Value  34/74/2595 3.8   Sodium (mmol/L)  Date Value  02/21/2023 139    Height: 5\' 7"  (170.2 cm) Weight: 66.2 kg (145 lb 15.1 oz) IBW/kg (Calculated) : 66.1 Estimated Creatinine Clearance: 74.6 mL/min (by C-G formula based on SCr of 0.69 mg/dL).  Assessment  Joe Torres is a 77 y.o. male presenting with ileostomy reversal. PMH significant for severe COPD. Pharmacy has been consulted to monitor and replace electrolytes.  Diet: Full liquid MIVF: N/A Pertinent medications: hydrochlorothiazide, Kcl 20 mEq BID  Goal of Therapy: Electrolytes within normal limits  Plan:  K 3.5, continue Kcl 20 mEq BID and give additional 40 mEq x 1 dose today Hold off on checking labs for tomorrow. Will consider labs [phone]  Thank you for allowing pharmacy to be a part of this patient's care.  Tressie Ellis 02/21/2023 7:52 AM

## 2023-02-21 NOTE — Discharge Summary (Signed)
Patient ID: Joe Torres MRN: [id number] DOB/AGE: 13-Jan-1946 77 y.o.  Admit date: 02/12/2023 Discharge date: 02/21/2023   Discharge Diagnoses:  Principal Problem:   Ileostomy status (HCC)   Procedures: Ileostomy reversal                      Partial colectomy with removal of distal ileum and anastomosis  Hospital Course: Patient admitted for ileostomy takedown.  He developed obstruction of the small bowel anastomosis.  He was taken back to the operating room for revision of the anastomosis.  Due to the proximity to the cecum was needed to perform a partial colectomy with removal of the terminal ileum and ileal colotomy was performed.  Patient started to recover slowly after postoperative ileus resolved.  Now patient is ambulating.  Pain control.  Patient is tolerating solid diet and having regular bowel movements.  No abdominal pain.  The incision is healing well.  Pulmonary wise patient has been stable.  Physical Exam Vitals reviewed.  HENT:     Head: Normocephalic.  Cardiovascular:     Rate and Rhythm: Normal rate and regular rhythm.  Pulmonary:     Effort: Pulmonary effort is normal.  Abdominal:     General: Abdomen is flat. Bowel sounds are normal. There is no distension.     Palpations: Abdomen is soft.     Tenderness: There is no abdominal tenderness.  Musculoskeletal:     Cervical back: Normal range of motion.  Skin:    General: Skin is warm.     Capillary Refill: Capillary refill takes less than 2 seconds.  Neurological:     Mental Status: He is alert.      Consults: Pulmonology - Dr. Karna Christmas  Disposition: Discharge disposition: 01-Home or Self Care       Discharge Instructions     Diet - low sodium heart healthy   Complete by: As directed    Increase activity slowly   Complete by: As directed       Allergies as of 02/21/2023   No Known Allergies      Medication List     TAKE these medications    acetaminophen 500 MG tablet Commonly known  as: TYLENOL Take 500 mg by mouth every 8 (eight) hours as needed for moderate pain.   albuterol 108 (90 Base) MCG/ACT inhaler Commonly known as: VENTOLIN HFA Inhale 2 puffs into the lungs every 4 (four) hours as needed for wheezing or shortness of breath.   albuterol (2.5 MG/3ML) 0.083% nebulizer solution Commonly known as: PROVENTIL Take 2.5 mg by nebulization every 4 (four) hours as needed for wheezing or shortness of breath.   amLODipine 10 MG tablet Commonly known as: NORVASC Take 10 mg by mouth at bedtime.   arformoterol 15 MCG/2ML Nebu Commonly known as: BROVANA Take 15 mcg by nebulization 2 (two) times daily.   budesonide 0.5 MG/2ML nebulizer solution Commonly known as: PULMICORT Take 0.5 mg by nebulization 2 (two) times daily.   cetirizine 10 MG tablet Commonly known as: ZYRTEC Take 10 mg by mouth at bedtime.   diclofenac Sodium 1 % Gel Commonly known as: VOLTAREN Apply 1 application topically 2 (two) times daily as needed (pain).   Dupilumab 300 MG/2ML Sopn Inject 300 mg into the skin every 14 (fourteen) days.   famotidine 40 MG tablet Commonly known as: PEPCID Take 40 mg by mouth at bedtime.   fluticasone 50 MCG/ACT nasal spray Commonly known as: FLONASE Place 1 spray into both  nostrils daily.   hydrochlorothiazide 25 MG tablet Commonly known as: HYDRODIURIL Take 25 mg by mouth daily.   lidocaine-prilocaine cream Commonly known as: EMLA Apply to affected area once   losartan 25 MG tablet Commonly known as: COZAAR Take 25 mg by mouth daily.   montelukast 10 MG tablet Commonly known as: SINGULAIR Take 10 mg by mouth at bedtime.   OXYGEN Inhale 3 L into the lungs continuous.   pantoprazole 40 MG tablet Commonly known as: PROTONIX Take 40 mg by mouth daily.   potassium chloride SA 20 MEQ tablet Commonly known as: Klor-Con M20 Take 1 tablet (20 mEq total) by mouth 2 (two) times daily for 7 days.   predniSONE 10 MG tablet Commonly known as:  DELTASONE Take 1 tablet (10 mg total) by mouth daily. Take 20 mg daily x 3 days then resume 10 mg daily afterwards What changed: additional instructions   roflumilast 500 MCG Tabs tablet Commonly known as: DALIRESP Take 500 mcg by mouth daily.   simvastatin 20 MG tablet Commonly known as: ZOCOR Take 20 mg by mouth daily at 6 PM.   tamsulosin 0.4 MG Caps capsule Commonly known as: FLOMAX TAKE 1 CAPSULE BY MOUTH EVERY DAY   tiotropium 18 MCG inhalation capsule Commonly known as: SPIRIVA Place 18 mcg into inhaler and inhale daily.   traMADol 50 MG tablet Commonly known as: ULTRAM Take 50 mg by mouth 2 (two) times daily. What changed: Another medication with the same name was added. Make sure you understand how and when to take each.   traMADol 50 MG tablet Commonly known as: Ultram Take 1 tablet (50 mg total) by mouth every 6 (six) hours as needed. What changed: You were already taking a medication with the same name, and this prescription was added. Make sure you understand how and when to take each.   Vitamin D-3 25 MCG (1000 UT) Caps Take 1 capsule by mouth at bedtime.        Follow-up Information     Carolan Shiver, MD Follow up in 1 week(s).   Specialty: General Surgery Contact information: [address] [place] [address] [phone]

## 2023-02-21 NOTE — Discharge Instructions (Signed)

## 2023-02-21 NOTE — TOC Transition Note (Signed)
Transition of Care [facility]) - CM/SW Discharge Note   Patient Details  Name: Joe Torres MRN: [id number] Date of Birth: Mar 31, 1946  [facility] CM/SW Contact:  Truddie Hidden, RN Phone Number: 02/21/2023, 9:54 AM   Clinical Narrative:    Spoke with patient's daughter, [name] regarding discharge home today. Celicila stated she was aware of discharge. Patient has a home oxygen in the room for discharge.   TOC signing off.       Barriers to Discharge: Continued Medical Work up   Patient Goals and CMS Choice CMS Medicare.gov Compare Post Acute Care list provided to:: Patient Choice offered to / list presented to : Patient  Discharge Placement                         Discharge Plan and Services Additional resources added to the After Visit Summary for                                       Social Determinants of Health (SDOH) Interventions SDOH Screenings   Food Insecurity: No Food Insecurity (02/12/2023)  Housing: Low Risk  (02/12/2023)  Transportation Needs: No Transportation Needs (02/12/2023)  Utilities: Not At Risk (02/12/2023)  Financial Resource Strain: Low Risk  (04/22/2019)   Received from [facility], [facility]  Tobacco Use: Medium Risk (02/12/2023)  Health Literacy: Medium Risk (08/20/2020)   Received from [facility], [facility]     Readmission Risk Interventions     No data to display

## 2023-02-21 NOTE — Care Management Important Message (Signed)
Important Message  Patient Details  Name: Joe Torres MRN: [id number] Date of Birth: [birth year]   Important Message Given:  Yes - Medicare IM     Verita Schneiders Hali Balgobin 02/21/2023, 10:37 AM

## 2023-02-22 ENCOUNTER — Other Ambulatory Visit: Payer: Self-pay | Admitting: General Surgery

## 2023-02-22 MED ORDER — OXYCODONE HCL 5 MG PO TABS
5.0000 mg | ORAL_TABLET | ORAL | 0 refills | Status: DC | PRN
Start: 2023-02-22 — End: 2023-02-22

## 2023-02-22 MED ORDER — OXYCODONE HCL 5 MG PO TABS
5.0000 mg | ORAL_TABLET | ORAL | 0 refills | Status: DC | PRN
Start: 2023-02-22 — End: 2023-05-27

## 2023-05-20 ENCOUNTER — Ambulatory Visit
Admission: RE | Admit: 2023-05-20 | Discharge: 2023-05-20 | Disposition: A | Discharge status: Home / Self Care | Payer: Medicare HMO | Source: Ambulatory Visit | Attending: Oncology | Admitting: Oncology

## 2023-05-20 DIAGNOSIS — Z08 Encounter for follow-up examination after completed treatment for malignant neoplasm: Secondary | ICD-10-CM | POA: Diagnosis present

## 2023-05-20 DIAGNOSIS — Z8551 Personal history of malignant neoplasm of bladder: Secondary | ICD-10-CM | POA: Insufficient documentation

## 2023-05-20 LAB — POCT I-STAT CREATININE: Creatinine, Ser: 0.9 mg/dL (ref 0.61–1.24)

## 2023-05-20 MED ORDER — IOHEXOL 300 MG/ML  SOLN
100.0000 mL | Freq: Once | INTRAMUSCULAR | Status: AC | PRN
  Administered 2023-05-20: 100 mL via INTRAVENOUS

## 2023-05-27 ENCOUNTER — Ambulatory Visit (INDEPENDENT_AMBULATORY_CARE_PROVIDER_SITE_OTHER): Payer: Medicare HMO | Admitting: Urology

## 2023-05-27 ENCOUNTER — Other Ambulatory Visit: Payer: Medicare HMO

## 2023-05-27 ENCOUNTER — Ambulatory Visit: Payer: Medicare HMO | Admitting: Oncology

## 2023-05-27 VITALS — BP 149/66 | HR 100

## 2023-05-27 DIAGNOSIS — Z8551 Personal history of malignant neoplasm of bladder: Secondary | ICD-10-CM

## 2023-05-27 DIAGNOSIS — Z08 Encounter for follow-up examination after completed treatment for malignant neoplasm: Secondary | ICD-10-CM | POA: Diagnosis not present

## 2023-05-27 DIAGNOSIS — R3129 Other microscopic hematuria: Secondary | ICD-10-CM

## 2023-05-27 LAB — URINALYSIS, COMPLETE
Bilirubin, UA: NEGATIVE
Glucose, UA: NEGATIVE
Ketones, UA: NEGATIVE
Leukocytes,UA: NEGATIVE
Nitrite, UA: NEGATIVE
Protein,UA: NEGATIVE
RBC, UA: NEGATIVE
Specific Gravity, UA: 1.015 (ref 1.005–1.030)
Urobilinogen, Ur: 0.2 mg/dL (ref 0.2–1.0)
pH, UA: 5.5 (ref 5.0–7.5)

## 2023-05-27 LAB — MICROSCOPIC EXAMINATION: Bacteria, UA: NONE SEEN

## 2023-05-27 NOTE — Progress Notes (Signed)
   05/27/23 CC:  Chief Complaint  Patient presents with   Cysto     HPI: Joe Torres is a 78 y.o. male with a personal history of muscle invasive bladder cancer , who returns today for surveillance cystoscopy.  He was taken to the OR for TURBT on 05/22/2020 at which time several overlapping posterior bladder wall tumors were identified, at least 2.5 cm as well as a more worrisome irregular nodular tumor in the left lateral bladder wall which was highly suspicious for muscle invasive cancer.   He is now status post chemo 5-FU / radiation.   He follows up today for cystoscopy.  He underwent CT chest abdomen pelvis ordered by Dr. Melanee on 05/22/2022 that shows no evidence of metastatic disease.  He has no complaints today.  He is accompanied by his wife.   Vitals:   05/27/23 1019  BP: (!) 149/66  Pulse: 100    NED. A&Ox3.   No respiratory distress   Abd soft, NT, ND Normal phallus with bilateral descended testicles  Cystoscopy Procedure Note  Patient identification was confirmed, informed consent was obtained, and patient was prepped using Betadine solution.  Lidocaine  jelly was administered per urethral meatus.     Pre-Procedure: - Inspection reveals a normal caliber ureteral meatus.  Procedure: The flexible cystoscope was introduced without difficulty - No urethral strictures/lesions are present. - Enlarged prostate  - Normal bladder neck - Bilateral ureteral orifices identified - No bladder stones - moderate trabeculation - TURBT resection scar on base of bladder moderately trabeculated -Very subtle erythema at the trigone consistent with probable radiation changes.  There is no obvious papillary change.  Post-Procedure: - Patient tolerated the procedure well   Assessment/ Plan:  History of bladder cancer -Cysto is today was reassuring, no evidence of recurrent disease -Followed by medical oncology for surveillance imaging, reviewed today -Cystoscopy in 6  months  F/u 6 mo cysto  Rosina Riis, MD

## 2023-06-03 ENCOUNTER — Inpatient Hospital Stay: Payer: Medicare HMO

## 2023-06-03 ENCOUNTER — Other Ambulatory Visit: Payer: Medicare HMO

## 2023-06-03 ENCOUNTER — Encounter: Payer: Self-pay | Admitting: Oncology

## 2023-06-03 ENCOUNTER — Inpatient Hospital Stay: Payer: Medicare HMO | Attending: Oncology | Admitting: Oncology

## 2023-06-03 VITALS — BP 111/77 | HR 56 | Temp 98.5°F | Resp 18 | Wt 149.0 lb

## 2023-06-03 DIAGNOSIS — C679 Malignant neoplasm of bladder, unspecified: Secondary | ICD-10-CM | POA: Diagnosis present

## 2023-06-03 DIAGNOSIS — Z9221 Personal history of antineoplastic chemotherapy: Secondary | ICD-10-CM | POA: Diagnosis not present

## 2023-06-03 DIAGNOSIS — E876 Hypokalemia: Secondary | ICD-10-CM | POA: Diagnosis not present

## 2023-06-03 DIAGNOSIS — J449 Chronic obstructive pulmonary disease, unspecified: Secondary | ICD-10-CM | POA: Insufficient documentation

## 2023-06-03 DIAGNOSIS — Z87891 Personal history of nicotine dependence: Secondary | ICD-10-CM | POA: Diagnosis not present

## 2023-06-03 DIAGNOSIS — Z08 Encounter for follow-up examination after completed treatment for malignant neoplasm: Secondary | ICD-10-CM

## 2023-06-03 DIAGNOSIS — Z8551 Personal history of malignant neoplasm of bladder: Secondary | ICD-10-CM

## 2023-06-03 DIAGNOSIS — Z9981 Dependence on supplemental oxygen: Secondary | ICD-10-CM | POA: Diagnosis not present

## 2023-06-03 DIAGNOSIS — Z923 Personal history of irradiation: Secondary | ICD-10-CM | POA: Diagnosis not present

## 2023-06-03 LAB — COMPREHENSIVE METABOLIC PANEL
ALT: 34 U/L (ref 0–44)
AST: 22 U/L (ref 15–41)
Albumin: 4 g/dL (ref 3.5–5.0)
Alkaline Phosphatase: 61 U/L (ref 38–126)
Anion gap: 10 (ref 5–15)
BUN: 18 mg/dL (ref 8–23)
CO2: 30 mmol/L (ref 22–32)
Calcium: 8.7 mg/dL — ABNORMAL LOW (ref 8.9–10.3)
Chloride: 98 mmol/L (ref 98–111)
Creatinine, Ser: 0.76 mg/dL (ref 0.61–1.24)
GFR, Estimated: >60 mL/min (ref >60)
Glucose, Bld: 97 mg/dL (ref 70–99)
Potassium: 3.3 mmol/L — ABNORMAL LOW (ref 3.5–5.1)
Sodium: 138 mmol/L (ref 135–145)
Total Bilirubin: 0.7 mg/dL (ref 0.0–1.2)
Total Protein: 6.9 g/dL (ref 6.5–8.1)

## 2023-06-03 LAB — CBC WITH DIFFERENTIAL/PLATELET
Abs Immature Granulocytes: 0.13 10*3/uL — ABNORMAL HIGH (ref 0.00–0.07)
Basophils Absolute: 0.1 10*3/uL (ref 0.0–0.1)
Basophils Relative: 0 %
Eosinophils Absolute: 0.2 10*3/uL (ref 0.0–0.5)
Eosinophils Relative: 2 %
HCT: 42.1 % (ref 39.0–52.0)
Hemoglobin: 13.7 g/dL (ref 13.0–17.0)
Immature Granulocytes: 1 %
Lymphocytes Relative: 10 %
Lymphs Abs: 1.2 10*3/uL (ref 0.7–4.0)
MCH: 28.5 pg (ref 26.0–34.0)
MCHC: 32.5 g/dL (ref 30.0–36.0)
MCV: 87.7 fL (ref 80.0–100.0)
Monocytes Absolute: 1.1 10*3/uL — ABNORMAL HIGH (ref 0.1–1.0)
Monocytes Relative: 9 %
Neutro Abs: 9.1 10*3/uL — ABNORMAL HIGH (ref 1.7–7.7)
Neutrophils Relative %: 78 %
Platelets: 315 10*3/uL (ref 150–400)
RBC: 4.8 MIL/uL (ref 4.22–5.81)
RDW: 13.7 % (ref 11.5–15.5)
WBC: 11.9 10*3/uL — ABNORMAL HIGH (ref 4.0–10.5)
nRBC: 0 % (ref 0.0–0.2)

## 2023-06-03 MED ORDER — HEPARIN SOD (PORK) LOCK FLUSH 100 UNIT/ML IV SOLN
500.0000 [IU] | Freq: Once | INTRAVENOUS | Status: AC
  Administered 2023-06-03: 500 [IU] via INTRAVENOUS
  Filled 2023-06-03: qty 5

## 2023-06-03 MED ORDER — SODIUM CHLORIDE 0.9% FLUSH
10.0000 mL | Freq: Once | INTRAVENOUS | Status: AC
  Administered 2023-06-03: 10 mL via INTRAVENOUS
  Filled 2023-06-03: qty 10

## 2023-06-03 NOTE — Progress Notes (Signed)
Hematology/Oncology Consult note [facility]  [phone] [phone]  Patient Care Team: Oswaldo Conroy, MD as PCP - General (Family Medicine) Carmina Miller, MD as Consulting Physician (Radiation Oncology) Creig Hines, MD as Consulting Physician (Oncology) Vanna Scotland, MD as Consulting Physician (Urology) Jacquelyne Balint, MD as Consulting Physician (Cardiology) Wyline Mood, MD as Consulting Physician (Gastroenterology) Sharen Heck, MD as Consulting Physician (Pulmonary Disease) Wyn Quaker Marlow Baars, MD as Consulting Physician (Vascular Surgery)   Name of the patient: Joe Torres  [id number]  [birth year]   Date of visit: 06/03/23  Diagnosis- 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  He presented with symptoms of gross hematuria and underwent CT urogram which showed 2 lesions in the bladder 1 measuring 2.1 x 2.1 cm in the posterior bladder wall as well as an eccentric mass just left of the midline measuring 1.6 x 1.1 cm.  Patient was seen by urology and underwent cystoscopy.  Visualization was limited due to gross hematuria.  Large at least 2 cm spherical high-grade appearing tumor in the midline posterior bladder wall.  This was followed by TURBT which showed a 2.5 cm posterior bladder tumor appearing necrotic/hemorrhagic another 2 cm nodular area which had a submucosal solid infiltrative appearance.  Significant bladder varices and hypervascularity surrounding tumors.  Pathology showed invasive urothelial carcinoma high-grade with high suspicion for muscularis propria involvement.  Scattered small fragments of muscularis propria identified an invasive tumor was seen adjacent to  these muscle bundles.  However unequivocal muscle invasion was not readily apparent.  Patient could not urinate following his TURBT and required temporary placement of Foley catheter but was able to pass his trial of void later.  Risk of repeat TURBT was discussed and mutual consensus was to avoid another invasive procedure given his oxygen dependent COPD.  Case discussed at tumor board.  He was not deemed to be a good candidate for cystoprostatectomy.  Based on the appearance of the tumor at the time of TURBT as well as pathology, it was deemed that there was a high risk of muscle invasion.    Patient completed concurrent chemoradiation to his bladder with 5-FU mitomycin regimen in March 2022.  And remains in remission presently  Interval history-patient underwent ileostomy reversal in October 2024.  Reports that his bowel movements are normal.  Denies any blood loss in his stool or urine.  Denies any dark melanotic stools.  He has baseline shortness of breath which is overall stable.  ECOG PS- 2 Pain scale- 0   Review of systems- Review of Systems  Constitutional:  Negative for chills, fever, malaise/fatigue and weight loss.  HENT:  Negative for congestion, ear discharge and nosebleeds.   Eyes:  Negative for blurred vision.  Respiratory:  Positive for shortness of breath. Negative for cough, hemoptysis, sputum production and wheezing.   Cardiovascular:  Negative for chest pain, palpitations, orthopnea and claudication.  Gastrointestinal:  Negative for abdominal pain, blood in stool, constipation, diarrhea, heartburn, melena, nausea and vomiting.  Genitourinary:  Negative for dysuria, flank pain, frequency, hematuria and urgency.  Musculoskeletal:  Negative for back pain, joint pain and myalgias.  Skin:  Negative for rash.  Neurological:  Negative for dizziness, tingling, focal weakness, seizures, weakness  and headaches.  Endo/Heme/Allergies:  Does not bruise/bleed easily.   Psychiatric/Behavioral:  Negative for depression and suicidal ideas. The patient does not have insomnia.       No Known Allergies   Past Medical History:  Diagnosis Date   Arthritis    Asthma    Bladder cancer (HCC)    BPH (benign prostatic hyperplasia)    Cancer (HCC)    Cataracts, bilateral    COPD, severe (HCC)    Diverticulitis of large intestine with perforation without bleeding    Dyspnea    GERD (gastroesophageal reflux disease)    Hyperlipidemia    Hypertension    Obstructive sleep apnea    C-pap with oxygen   Osteoarthritis    Panlobular emphysema (HCC)    Postoperative ileus (HCC)    Right inguinal hernia      Past Surgical History:  Procedure Laterality Date   APPENDECTOMY  02/12/2023   Procedure: APPENDECTOMY;  Surgeon: Carolan Shiver, MD;  Location: [facility] ORS;  Service: General;;   CATARACT EXTRACTION W/ INTRAOCULAR LENS IMPLANT Right 05/18/2013   CATARACT EXTRACTION W/ INTRAOCULAR LENS IMPLANT Left 07/06/2013   COLONOSCOPY     COLONOSCOPY WITH PROPOFOL N/A 01/16/2021   Procedure: COLONOSCOPY WITH PROPOFOL;  Surgeon: Wyline Mood, MD;  Location: [facility] ENDOSCOPY;  Service: Gastroenterology;  Laterality: N/A;  SPANISH INTERPRETER 9 AM ARRIVAL REQUEST   ILEOSTOMY N/A 11/03/2022   Procedure: ILEOSTOMY;  Surgeon: Carolan Shiver, MD;  Location: [facility] ORS;  Service: General;  Laterality: N/A;   ILEOSTOMY CLOSURE N/A 02/12/2023   Procedure: ILEOSTOMY TAKEDOWN;  Surgeon: Carolan Shiver, MD;  Location: [facility] ORS;  Service: General;  Laterality: N/A;   INGUINAL HERNIA REPAIR Right 01/08/2016   LAPAROTOMY N/A 10/20/2022   Procedure: EXPLORATORY LAPAROTOMY,  SIGMOID COLECTOMY, LOOP ILEOSTOMY;  Surgeon: Carolan Shiver, MD;  Location: [facility] ORS;  Service: General;  Laterality: N/A;   LAPAROTOMY N/A 02/15/2023   Procedure: EXPLORATORY LAPAROTOMY;  Surgeon: Carolan Shiver, MD;  Location: [facility] ORS;  Service: General;  Laterality: N/A;   PORTA  CATH INSERTION N/A 06/19/2020   Procedure: PORTA CATH INSERTION;  Surgeon: Annice Needy, MD;  Location: [facility] INVASIVE CV LAB;  Service: Cardiovascular;  Laterality: N/A;   TRANSURETHRAL RESECTION OF BLADDER TUMOR WITH GYRUS (TURBT-GYRUS)  07/2020   TRANSURETHRAL RESECTION OF BLADDER TUMOR WITH MITOMYCIN-C N/A 05/22/2020   Procedure: TRANSURETHRAL RESECTION OF BLADDER TUMOR WITH Gemcitabine;  Surgeon: Vanna Scotland, MD;  Location: [facility] ORS;  Service: Urology;  Laterality: N/A;    Social History   Socioeconomic History   Marital status: Married    Spouse name: Cecilia   Number of children: 4   Years of education: Not on file   Highest education level: Not on file  Occupational History   Not on file  Tobacco Use   Smoking status: Former    Current packs/day: 0.00    Average packs/day: 1.5 packs/day for 40.0 years (60.0 ttl pk-yrs)    Types: Cigarettes    Start date: 05/21/1968    Quit date: 05/21/2008    Years since quitting: 15.0    Passive exposure: Past   Smokeless tobacco: Never  Vaping Use   Vaping status: Never Used  Substance and Sexual Activity   Alcohol use: Not Currently   Drug use: Never   Sexual activity: Not Currently  Other Topics Concern   Not on file  Social History Narrative   Not on file   Social Drivers of Health   Financial Resource Strain: Low Risk  (  04/22/2019)   Received from [facility]   Overall Financial Resource Strain (CARDIA)    Difficulty of Paying Living Expenses: Not very hard  Food Insecurity: No Food Insecurity (02/12/2023)   Hunger Vital Sign    Worried About Running Out of Food in the Last Year: Never true    Ran Out of Food in the Last Year: Never true  Transportation Needs: No Transportation Needs (02/12/2023)   PRAPARE - Administrator, Civil Service (Medical): No    Lack of Transportation (Non-Medical): No  Physical Activity: Not on file  Stress: Not on file  Social Connections: Not on file   Intimate Partner Violence: Not At Risk (02/13/2023)   Humiliation, Afraid, Rape, and Kick questionnaire    Fear of Current or Ex-Partner: No    Emotionally Abused: No    Physically Abused: No    Sexually Abused: No    Family History  Problem Relation Age of Onset   Asthma Mother      Current Outpatient Medications:    OHTUVAYRE 3 MG/2.5ML SUSP, , Disp: , Rfl:    acetaminophen (TYLENOL) 500 MG tablet, Take 500 mg by mouth every 8 (eight) hours as needed for moderate pain., Disp: , Rfl:    albuterol (PROVENTIL HFA;VENTOLIN HFA) 108 (90 Base) MCG/ACT inhaler, Inhale 2 puffs into the lungs every 4 (four) hours as needed for wheezing or shortness of breath., Disp: , Rfl:    albuterol (PROVENTIL) (2.5 MG/3ML) 0.083% nebulizer solution, Take 2.5 mg by nebulization every 4 (four) hours as needed for wheezing or shortness of breath., Disp: , Rfl:    amLODipine (NORVASC) 10 MG tablet, Take 10 mg by mouth at bedtime., Disp: , Rfl:    arformoterol (BROVANA) 15 MCG/2ML NEBU, Take 15 mcg by nebulization 2 (two) times daily., Disp: , Rfl:    budesonide (PULMICORT) 0.5 MG/2ML nebulizer solution, Take 0.5 mg by nebulization 2 (two) times daily., Disp: , Rfl:    cetirizine (ZYRTEC) 10 MG tablet, Take 10 mg by mouth at bedtime., Disp: , Rfl:    Cholecalciferol (VITAMIN D-3) 25 MCG (1000 UT) CAPS, Take 1 capsule by mouth at bedtime., Disp: , Rfl:    diclofenac Sodium (VOLTAREN) 1 % GEL, Apply 1 application topically 2 (two) times daily as needed (pain)., Disp: , Rfl:    famotidine (PEPCID) 40 MG tablet, Take 40 mg by mouth at bedtime., Disp: , Rfl:    fluticasone (FLONASE) 50 MCG/ACT nasal spray, Place 1 spray into both nostrils daily., Disp: , Rfl:    hydrochlorothiazide (HYDRODIURIL) 25 MG tablet, Take 25 mg by mouth daily., Disp: , Rfl:    lidocaine-prilocaine (EMLA) cream, Apply to affected area once, Disp: 30 g, Rfl: 3   losartan (COZAAR) 25 MG tablet, Take 25 mg by mouth daily., Disp: , Rfl:     montelukast (SINGULAIR) 10 MG tablet, Take 10 mg by mouth at bedtime., Disp: , Rfl:    OXYGEN, Inhale 3 L into the lungs continuous., Disp: , Rfl:    pantoprazole (PROTONIX) 40 MG tablet, Take 40 mg by mouth daily., Disp: , Rfl:    potassium chloride SA (KLOR-CON M20) 20 MEQ tablet, Take 1 tablet (20 mEq total) by mouth 2 (two) times daily for 7 days., Disp: 14 tablet, Rfl: 0   predniSONE (DELTASONE) 10 MG tablet, Take 1 tablet (10 mg total) by mouth daily. Take 20 mg daily x 3 days then resume 10 mg daily afterwards (Patient taking differently:  Take 10 mg by mouth daily.), Disp: , Rfl:    simvastatin (ZOCOR) 20 MG tablet, Take 20 mg by mouth daily at 6 PM., Disp: , Rfl:    tamsulosin (FLOMAX) 0.4 MG CAPS capsule, TAKE 1 CAPSULE BY MOUTH EVERY DAY, Disp: 90 capsule, Rfl: 3   tiotropium (SPIRIVA) 18 MCG inhalation capsule, Place 18 mcg into inhaler and inhale daily., Disp: , Rfl:    traMADol (ULTRAM) 50 MG tablet, Take 50 mg by mouth 2 (two) times daily., Disp: , Rfl:   Physical exam:  Vitals:   06/03/23 1034 06/03/23 1038  BP: (!) 145/79 111/77  Pulse: (!) 56   Resp: 18   Temp: 98.5 F (36.9 C)   TempSrc: Tympanic   SpO2: 97%   Weight: 149 lb (67.6 kg)    Physical Exam Constitutional:      Comments: He is sitting in a wheelchair and is on home oxygen 2 L.  Appears in no acute distress  Cardiovascular:     Rate and Rhythm: Normal rate and regular rhythm.     Heart sounds: Normal heart sounds.  Pulmonary:     Effort: Pulmonary effort is normal.     Breath sounds: Normal breath sounds.  Abdominal:     General: Bowel sounds are normal.     Palpations: Abdomen is soft.  Musculoskeletal:     Cervical back: Normal range of motion.  Skin:    General: Skin is warm and dry.  Neurological:     Mental Status: He is alert and oriented to person, place, and time.         Latest Ref Rng & Units 06/03/2023   10:11 AM  CMP  Glucose 70 - 99 mg/dL 97   BUN 8 - 23 mg/dL 18   Creatinine  5.36 - 1.24 mg/dL 6.44   Sodium 034 - 742 mmol/L 138   Potassium 3.5 - 5.1 mmol/L 3.3   Chloride 98 - 111 mmol/L 98   CO2 22 - 32 mmol/L 30   Calcium 8.9 - 10.3 mg/dL 8.7   Total Protein 6.5 - 8.1 g/dL 6.9   Total Bilirubin 0.0 - 1.2 mg/dL 0.7   Alkaline Phos 38 - 126 U/L 61   AST 15 - 41 U/L 22   ALT 0 - 44 U/L 34       Latest Ref Rng & Units 06/03/2023   10:11 AM  CBC  WBC 4.0 - 10.5 K/uL 11.9   Hemoglobin 13.0 - 17.0 g/dL 59.5   Hematocrit 63.8 - 52.0 % 42.1   Platelets 150 - 400 K/uL 315     No images are attached to the encounter.  CT CHEST ABDOMEN PELVIS W CONTRAST Result Date: 05/23/2023 CLINICAL DATA:  bladder cancer EXAM: CT CHEST, ABDOMEN, AND PELVIS WITH CONTRAST TECHNIQUE: Multidetector CT imaging of the chest, abdomen and pelvis was performed following the standard protocol during bolus administration of intravenous contrast. RADIATION DOSE REDUCTION: This exam was performed according to the departmental dose-optimization program which includes automated exposure control, adjustment of the mA and/or kV according to patient size and/or use of iterative reconstruction technique. CONTRAST:  OMNIPAQUE IOHEXOL 300 MG/ML  SOLN COMPARISON:  CT abdomen and pelvis 02/18/2023. CTA chest 10/20/2022. FINDINGS: CT CHEST FINDINGS Cardiovascular: No significant vascular findings. Normal heart size. No pericardial effusion. Right chest wall infusion port terminating in the superior cavoatrial junction. Mediastinum/Nodes: No enlarged mediastinal, hilar, or axillary lymph nodes. Thyroid gland, trachea, and esophagus demonstrate no significant findings. Lungs/Pleura:  Severe emphysematous changes. No consolidation, pleural effusion or pneumothorax. No pulmonary nodules. Musculoskeletal: No chest wall mass or suspicious bone lesions identified. Degenerative changes. CT ABDOMEN PELVIS FINDINGS Hepatobiliary: No focal liver abnormality is seen. Stable subcentimeter right lobe cyst. No gallstones,  gallbladder wall thickening, or biliary dilatation. Pancreas: Unremarkable. No pancreatic ductal dilatation or surrounding inflammatory changes. Spleen: Normal in size without focal abnormality. Adrenals/Urinary Tract: Adrenal glands are unremarkable. Kidneys are normal, without renal calculi, focal lesion, or hydronephrosis. Simple right midpole cortical cyst. Bladder is unremarkable. Stomach/Bowel: Stomach is within normal limits. Nonvisualized appendix. Prior sigmoid and ileocolonic anastomosis. Diverticulosis. No evidence of bowel wall thickening, distention, or inflammatory changes. Vascular/Lymphatic: Aortic atherosclerosis. No enlarged abdominal or pelvic lymph nodes. Reproductive: Prostate is unremarkable. Other: No abdominal wall hernia or abnormality. No abdominopelvic ascites. Musculoskeletal: No acute or significant osseous findings. IMPRESSION: No acute abnormality or evidence of metastatic disease in the chest, abdomen or pelvis. Electronically Signed   By: Levi Aland M.D.   On: 05/23/2023 15:14     Assessment and plan- Patient is a 78 y.o. male with history of muscle invasive bladder cancer s/p concurrent chemoradiation in March 2022 currently in remission.  He is here to discuss CT scan Results and further management  We are now nearing 3 years of surveillance.  CT chest abdomen and pelvis with contrast from January 2025 showed no evidence of recurrent or progressive disease.  Plan is to do continue with scans every 6 months up to 5 years from the time of treatment completion.  I will see him back in 3 months with labs CBC with CMP.  He has mild chronic hypokalemia and I am asked him to follow-up on this with his primary care doctor   Visit Diagnosis 1. Encounter for follow-up surveillance of bladder cancer      Dr. Owens Shark, MD, MPH [facility] [phone] 06/03/2023 2:01 PM

## 2023-07-17 ENCOUNTER — Other Ambulatory Visit: Payer: Self-pay | Admitting: Oncology

## 2023-07-18 ENCOUNTER — Other Ambulatory Visit: Payer: Self-pay

## 2023-07-18 MED ORDER — POTASSIUM CHLORIDE CRYS ER 20 MEQ PO TBCR: 20.0000 meq | EXTENDED_RELEASE_TABLET | Freq: Two times a day (BID) | ORAL | 0 refills | Status: DC

## 2023-07-31 ENCOUNTER — Other Ambulatory Visit: Payer: Self-pay | Admitting: Oncology

## 2023-08-08 ENCOUNTER — Other Ambulatory Visit: Payer: Self-pay | Admitting: Oncology

## 2023-08-14 ENCOUNTER — Other Ambulatory Visit: Payer: Self-pay

## 2023-08-14 MED ORDER — POTASSIUM CHLORIDE CRYS ER 20 MEQ PO TBCR: 20.0000 meq | EXTENDED_RELEASE_TABLET | Freq: Two times a day (BID) | ORAL | 0 refills | Status: DC

## 2023-08-25 ENCOUNTER — Encounter: Payer: Self-pay | Admitting: Urology

## 2023-10-30 ENCOUNTER — Telehealth: Payer: Self-pay

## 2023-10-30 DIAGNOSIS — Z860100 Personal history of colon polyps, unspecified: Secondary | ICD-10-CM

## 2023-10-30 NOTE — Telephone Encounter (Signed)
 Call patient's daughter back in August to schedule her fathers colonoscopy for September.  Autry Boas is on her father' DPR.

## 2023-11-25 ENCOUNTER — Other Ambulatory Visit: Payer: Self-pay | Admitting: Urology

## 2023-11-26 ENCOUNTER — Ambulatory Visit (INDEPENDENT_AMBULATORY_CARE_PROVIDER_SITE_OTHER): Admitting: Urology

## 2023-11-26 VITALS — BP 149/79 | HR 90 | Ht 67.0 in | Wt 149.0 lb

## 2023-11-26 DIAGNOSIS — C679 Malignant neoplasm of bladder, unspecified: Secondary | ICD-10-CM

## 2023-11-26 DIAGNOSIS — R3129 Other microscopic hematuria: Secondary | ICD-10-CM

## 2023-11-26 LAB — MICROSCOPIC EXAMINATION
Bacteria, UA: NONE SEEN
Epithelial Cells (non renal): NONE SEEN /HPF (ref 0–10)

## 2023-11-26 LAB — URINALYSIS, COMPLETE
Bilirubin, UA: NEGATIVE
Glucose, UA: NEGATIVE
Ketones, UA: NEGATIVE
Leukocytes,UA: NEGATIVE
Nitrite, UA: NEGATIVE
Protein,UA: NEGATIVE
RBC, UA: NEGATIVE
Specific Gravity, UA: <1.005 — ABNORMAL LOW (ref 1.005–1.030)
Urobilinogen, Ur: 0.2 mg/dL (ref 0.2–1.0)
pH, UA: 6.5 (ref 5.0–7.5)

## 2023-11-26 NOTE — Progress Notes (Unsigned)
   11/26/23  CC:  Chief Complaint  Patient presents with   Cysto   Urologic history: 1.  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  Denies gross hematuria.  Oncology follow-up January 2025-CT chest abdomen and pelvis with contrast showed no evidence of recurrent or progressive disease.  UA negative  Blood pressure (!) 149/79, pulse 90, height 5' 7 (1.702 m), weight 149 lb (67.6 kg).  Cystoscopy Procedure Note  Patient identification was confirmed, informed consent was obtained, and patient was prepped using Betadine solution.  Lidocaine  jelly was administered per urethral meatus.     Pre-Procedure: - Inspection reveals a normal caliber urethral meatus.  Procedure: The flexible cystoscope was introduced without difficulty - No urethral strictures/lesions are present. -Moderate lateral lobe enlargement prostate  - Normal bladder neck - Bilateral ureteral orifices identified - Bladder mucosa  reveals no ulcers, tumors, or lesions - No bladder stones -Moderate trabeculation  Retroflexion shows no tumor/abnormalities   Post-Procedure: - Patient tolerated the procedure well  Assessment/ Plan: No evidence recurrent bladder tumor Follow-up surveillance cystoscopy 6 months    Joe JAYSON Barba, MD

## 2023-12-01 ENCOUNTER — Ambulatory Visit
Admission: RE | Admit: 2023-12-01 | Discharge: 2023-12-01 | Disposition: A | Discharge status: Home / Self Care | Payer: Medicare HMO | Source: Ambulatory Visit | Attending: Oncology | Admitting: Oncology

## 2023-12-01 DIAGNOSIS — Z8551 Personal history of malignant neoplasm of bladder: Secondary | ICD-10-CM | POA: Insufficient documentation

## 2023-12-01 DIAGNOSIS — Z08 Encounter for follow-up examination after completed treatment for malignant neoplasm: Secondary | ICD-10-CM | POA: Insufficient documentation

## 2023-12-01 LAB — POCT I-STAT CREATININE: Creatinine, Ser: 0.9 mg/dL (ref 0.61–1.24)

## 2023-12-01 MED ORDER — IOHEXOL 300 MG/ML  SOLN
100.0000 mL | Freq: Once | INTRAMUSCULAR | Status: AC | PRN
  Administered 2023-12-01: 100 mL via INTRAVENOUS

## 2023-12-17 ENCOUNTER — Inpatient Hospital Stay: Payer: Medicare HMO | Admitting: Oncology

## 2023-12-17 ENCOUNTER — Encounter: Payer: Self-pay | Admitting: Oncology

## 2023-12-17 ENCOUNTER — Inpatient Hospital Stay: Payer: Medicare HMO | Attending: Oncology

## 2023-12-17 VITALS — BP 136/80 | HR 80 | Temp 96.0°F | Resp 18 | Ht 67.0 in | Wt 163.0 lb

## 2023-12-17 DIAGNOSIS — Z08 Encounter for follow-up examination after completed treatment for malignant neoplasm: Secondary | ICD-10-CM | POA: Insufficient documentation

## 2023-12-17 DIAGNOSIS — Z923 Personal history of irradiation: Secondary | ICD-10-CM | POA: Insufficient documentation

## 2023-12-17 DIAGNOSIS — Z9221 Personal history of antineoplastic chemotherapy: Secondary | ICD-10-CM | POA: Insufficient documentation

## 2023-12-17 DIAGNOSIS — E876 Hypokalemia: Secondary | ICD-10-CM | POA: Insufficient documentation

## 2023-12-17 DIAGNOSIS — Z8551 Personal history of malignant neoplasm of bladder: Secondary | ICD-10-CM | POA: Diagnosis not present

## 2023-12-17 LAB — CBC WITH DIFFERENTIAL (CANCER CENTER ONLY)
Abs Immature Granulocytes: 0.07 K/uL (ref 0.00–0.07)
Basophils Absolute: 0 K/uL (ref 0.0–0.1)
Basophils Relative: 0 %
Eosinophils Absolute: 0.5 K/uL (ref 0.0–0.5)
Eosinophils Relative: 7 %
HCT: 39.2 % (ref 39.0–52.0)
Hemoglobin: 12.7 g/dL — ABNORMAL LOW (ref 13.0–17.0)
Immature Granulocytes: 1 %
Lymphocytes Relative: 13 %
Lymphs Abs: 0.9 K/uL (ref 0.7–4.0)
MCH: 29.8 pg (ref 26.0–34.0)
MCHC: 32.4 g/dL (ref 30.0–36.0)
MCV: 92 fL (ref 80.0–100.0)
Monocytes Absolute: 0.8 K/uL (ref 0.1–1.0)
Monocytes Relative: 12 %
Neutro Abs: 4.6 K/uL (ref 1.7–7.7)
Neutrophils Relative %: 67 %
Platelet Count: 222 K/uL (ref 150–400)
RBC: 4.26 MIL/uL (ref 4.22–5.81)
RDW: 13.3 % (ref 11.5–15.5)
WBC Count: 6.9 K/uL (ref 4.0–10.5)
nRBC: 0 % (ref 0.0–0.2)

## 2023-12-17 LAB — CMP (CANCER CENTER ONLY)
ALT: 18 U/L (ref 0–44)
AST: 21 U/L (ref 15–41)
Albumin: 3.8 g/dL (ref 3.5–5.0)
Alkaline Phosphatase: 52 U/L (ref 38–126)
Anion gap: 6 (ref 5–15)
BUN: 12 mg/dL (ref 8–23)
CO2: 34 mmol/L — ABNORMAL HIGH (ref 22–32)
Calcium: 8.7 mg/dL — ABNORMAL LOW (ref 8.9–10.3)
Chloride: 98 mmol/L (ref 98–111)
Creatinine: 0.87 mg/dL (ref 0.61–1.24)
GFR, Estimated: >60 mL/min (ref >60)
Glucose, Bld: 153 mg/dL — ABNORMAL HIGH (ref 70–99)
Potassium: 3.2 mmol/L — ABNORMAL LOW (ref 3.5–5.1)
Sodium: 138 mmol/L (ref 135–145)
Total Bilirubin: 0.8 mg/dL (ref 0.0–1.2)
Total Protein: 6.4 g/dL — ABNORMAL LOW (ref 6.5–8.1)

## 2023-12-17 MED ORDER — POTASSIUM CHLORIDE CRYS ER 20 MEQ PO TBCR
EXTENDED_RELEASE_TABLET | ORAL | 0 refills | Status: DC
Start: 2023-12-17 — End: 2024-02-06

## 2023-12-17 NOTE — Progress Notes (Signed)
 Patient states wants to know about his potassium levels and he is on 2 1/2 liters at home & 3 liters when he is out for appointments and other things. He also states he feels like his right cheek has some swelling and would like insight regarding this issues.

## 2023-12-17 NOTE — Progress Notes (Signed)
 Hematology/Oncology Consult note [facility]  [phone] [phone]  Patient Care Team: Kandis Stefano Iles, MD as PCP - General (Family Medicine) Lenn Aran, MD as Consulting Physician (Radiation Oncology) Melanee Annah BROCKS, MD as Consulting Physician (Oncology) Penne Knee, MD as Consulting Physician (Urology) Pia Sharper, MD as Consulting Physician (Cardiology) Therisa Bi, MD as Consulting Physician (Gastroenterology) Debborah Thresa Lin, MD as Consulting Physician (Pulmonary Disease) Marea Selinda RAMAN, MD as Consulting Physician (Vascular Surgery)   Name of the patient: Joe Torres  [id number]  [birth year]   Date of visit: 12/17/23  Diagnosis- 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  He presented with symptoms of gross hematuria and underwent CT urogram which showed 2 lesions in the bladder 1 measuring 2.1 x 2.1 cm in the posterior bladder wall as well as an eccentric mass just left of the midline measuring 1.6 x 1.1 cm.  Patient was seen by urology and underwent cystoscopy.  Visualization was limited due to gross hematuria.  Large at least 2 cm spherical high-grade appearing tumor in the midline posterior bladder wall.  This was followed by TURBT which showed a 2.5 cm posterior bladder tumor appearing necrotic/hemorrhagic another 2 cm nodular area which had a submucosal solid infiltrative appearance.  Significant bladder varices and hypervascularity surrounding tumors.  Pathology showed invasive urothelial carcinoma high-grade with high suspicion for muscularis propria involvement.  Scattered small fragments of muscularis propria identified an invasive tumor was seen adjacent to these  muscle bundles.  However unequivocal muscle invasion was not readily apparent.  Patient could not urinate following his TURBT and required temporary placement of Foley catheter but was able to pass his trial of void later.  Risk of repeat TURBT was discussed and mutual consensus was to avoid another invasive procedure given his oxygen dependent COPD.  Case discussed at tumor board.  He was not deemed to be a good candidate for cystoprostatectomy.  Based on the appearance of the tumor at the time of TURBT as well as pathology, it was deemed that there was a high risk of muscle invasion.    Patient completed concurrent chemoradiation to his bladder with 5-FU mitomycin  regimen in March 2022.  And remains in remission presently    Interval history-patient sees Dr. Aleskerov for his chronic lung disease and was treated with recent course of antibiotics.  No recent hospitalizations.  ECOG PS- 2 Pain scale- 0   Review of systems- Review of Systems  Constitutional:  Negative for chills, fever, malaise/fatigue and weight loss.  HENT:  Negative for congestion, ear discharge and nosebleeds.   Eyes:  Negative for blurred vision.  Respiratory:  Negative for cough, hemoptysis, sputum production, shortness of breath and wheezing.   Cardiovascular:  Negative for chest pain, palpitations, orthopnea and claudication.  Gastrointestinal:  Negative for abdominal pain, blood in stool, constipation, diarrhea, heartburn, melena, nausea and vomiting.  Genitourinary:  Negative for dysuria, flank pain, frequency, hematuria and urgency.  Musculoskeletal:  Negative for back pain, joint pain and myalgias.  Skin:  Negative for rash.  Neurological:  Negative for dizziness, tingling, focal weakness, seizures, weakness and headaches.  Endo/Heme/Allergies:  Does not bruise/bleed easily.  Psychiatric/Behavioral:  Negative for depression and suicidal ideas. The patient does not  have insomnia.       Allergies  Allergen  Reactions   Dupilumab  Dermatitis     Past Medical History:  Diagnosis Date   Arthritis    Asthma    Bladder cancer (HCC)    BPH (benign prostatic hyperplasia)    Cancer (HCC)    Cataracts, bilateral    COPD, severe (HCC)    Diverticulitis of large intestine with perforation without bleeding    Dyspnea    GERD (gastroesophageal reflux disease)    Hyperlipidemia    Hypertension    Obstructive sleep apnea    C-pap with oxygen   Osteoarthritis    Panlobular emphysema (HCC)    Postoperative ileus (HCC)    Right inguinal hernia      Past Surgical History:  Procedure Laterality Date   APPENDECTOMY  02/12/2023   Procedure: APPENDECTOMY;  Surgeon: Rodolph Romano, MD;  Location: [facility] ORS;  Service: General;;   CATARACT EXTRACTION W/ INTRAOCULAR LENS IMPLANT Right 05/18/2013   CATARACT EXTRACTION W/ INTRAOCULAR LENS IMPLANT Left 07/06/2013   COLONOSCOPY     COLONOSCOPY WITH PROPOFOL  N/A 01/16/2021   Procedure: COLONOSCOPY WITH PROPOFOL ;  Surgeon: Therisa Bi, MD;  Location: [facility] ENDOSCOPY;  Service: Gastroenterology;  Laterality: N/A;  SPANISH INTERPRETER 9 AM ARRIVAL REQUEST   ILEOSTOMY N/A 11/03/2022   Procedure: ILEOSTOMY;  Surgeon: Rodolph Romano, MD;  Location: [facility] ORS;  Service: General;  Laterality: N/A;   ILEOSTOMY CLOSURE N/A 02/12/2023   Procedure: ILEOSTOMY TAKEDOWN;  Surgeon: Rodolph Romano, MD;  Location: [facility] ORS;  Service: General;  Laterality: N/A;   INGUINAL HERNIA REPAIR Right 01/08/2016   LAPAROTOMY N/A 10/20/2022   Procedure: EXPLORATORY LAPAROTOMY,  SIGMOID COLECTOMY, LOOP ILEOSTOMY;  Surgeon: Rodolph Romano, MD;  Location: [facility] ORS;  Service: General;  Laterality: N/A;   LAPAROTOMY N/A 02/15/2023   Procedure: EXPLORATORY LAPAROTOMY;  Surgeon: Rodolph Romano, MD;  Location: [facility] ORS;  Service: General;  Laterality: N/A;   PORTA CATH INSERTION N/A 06/19/2020   Procedure: PORTA CATH INSERTION;  Surgeon: Marea Selinda RAMAN, MD;   Location: [facility] INVASIVE CV LAB;  Service: Cardiovascular;  Laterality: N/A;   TRANSURETHRAL RESECTION OF BLADDER TUMOR WITH GYRUS (TURBT-GYRUS)  07/2020   TRANSURETHRAL RESECTION OF BLADDER TUMOR WITH MITOMYCIN -C N/A 05/22/2020   Procedure: TRANSURETHRAL RESECTION OF BLADDER TUMOR WITH Gemcitabine ;  Surgeon: Penne Knee, MD;  Location: [facility] ORS;  Service: Urology;  Laterality: N/A;    Social History   Socioeconomic History   Marital status: Married    Spouse name: Cecilia   Number of children: 4   Years of education: Not on file   Highest education level: Not on file  Occupational History   Not on file  Tobacco Use   Smoking status: Former    Current packs/day: 0.00    Average packs/day: 1.5 packs/day for 40.0 years (60.0 ttl pk-yrs)    Types: Cigarettes    Start date: 05/21/1968    Quit date: 05/21/2008    Years since quitting: 15.5    Passive exposure: Past   Smokeless tobacco: Never  Vaping Use   Vaping status: Never Used  Substance and Sexual Activity   Alcohol use: Not Currently   Drug use: Never   Sexual activity: Not Currently  Other Topics Concern   Not on file  Social History Narrative   Not on file   Social Drivers of Health   Financial Resource Strain: Low Risk  (07/24/2023)   Received from [facility]   Overall Financial Resource  Strain (CARDIA)    Difficulty of Paying Living Expenses: Not hard at all  Food Insecurity: No Food Insecurity (07/24/2023)   Received from [facility]   Hunger Vital Sign    Within the past 12 months, you worried that your food would run out before you got the money to buy more.: Never true    Within the past 12 months, the food you bought just didn't last and you didn't have money to get more.: Never true  Transportation Needs: No Transportation Needs (07/24/2023)   Received from [facility] - Transportation    In the past 12 months, has lack of transportation kept  you from medical appointments or from getting medications?: No    Lack of Transportation (Non-Medical): No  Physical Activity: Not on file  Stress: Not on file  Social Connections: Not on file  Intimate Partner Violence: Not At Risk (02/13/2023)   Humiliation, Afraid, Rape, and Kick questionnaire    Fear of Current or Ex-Partner: No    Emotionally Abused: No    Physically Abused: No    Sexually Abused: No    Family History  Problem Relation Age of Onset   Asthma Mother      Current Outpatient Medications:    acetaminophen  (TYLENOL ) 500 MG tablet, Take 500 mg by mouth every 8 (eight) hours as needed for moderate pain., Disp: , Rfl:    albuterol  (PROVENTIL  HFA;VENTOLIN  HFA) 108 (90 Base) MCG/ACT inhaler, Inhale 2 puffs into the lungs every 4 (four) hours as needed for wheezing or shortness of breath., Disp: , Rfl:    albuterol  (PROVENTIL ) (2.5 MG/3ML) 0.083% nebulizer solution, Take 2.5 mg by nebulization every 4 (four) hours as needed for wheezing or shortness of breath., Disp: , Rfl:    amLODipine  (NORVASC ) 10 MG tablet, Take 10 mg by mouth at bedtime., Disp: , Rfl:    arformoterol  (BROVANA ) 15 MCG/2ML NEBU, Take 15 mcg by nebulization 2 (two) times daily., Disp: , Rfl:    budesonide  (PULMICORT ) 0.5 MG/2ML nebulizer solution, Take 0.5 mg by nebulization 2 (two) times daily., Disp: , Rfl:    cetirizine (ZYRTEC) 10 MG tablet, Take 10 mg by mouth at bedtime., Disp: , Rfl:    Cholecalciferol (VITAMIN D-3) 25 MCG (1000 UT) CAPS, Take 1 capsule by mouth at bedtime., Disp: , Rfl:    diclofenac  Sodium (VOLTAREN ) 1 % GEL, Apply 1 application topically 2 (two) times daily as needed (pain)., Disp: , Rfl:    fluticasone  (FLONASE ) 50 MCG/ACT nasal spray, Place 1 spray into both nostrils daily., Disp: , Rfl:    hydrochlorothiazide  (HYDRODIURIL ) 25 MG tablet, Take 25 mg by mouth daily., Disp: , Rfl:    lidocaine -prilocaine  (EMLA ) cream, Apply to affected area once, Disp: 30 g, Rfl: 3   losartan   (COZAAR ) 25 MG tablet, Take 25 mg by mouth daily., Disp: , Rfl:    metFORMIN (GLUCOPHAGE) 500 MG tablet, Take 500 mg by mouth daily., Disp: , Rfl:    montelukast  (SINGULAIR ) 10 MG tablet, Take 10 mg by mouth at bedtime., Disp: , Rfl:    OXYGEN, Inhale 3 L into the lungs continuous., Disp: , Rfl:    pantoprazole  (PROTONIX ) 40 MG tablet, Take 40 mg by mouth daily., Disp: , Rfl:    potassium chloride  SA (KLOR-CON  M) 20 MEQ tablet, Take 1 tablet (20 mEq total) daily for 7 days., Disp: 7 tablet, Rfl: 0   simvastatin  (ZOCOR ) 20 MG tablet, Take 20 mg by  mouth daily at 6 PM., Disp: , Rfl:    tamsulosin  (FLOMAX ) 0.4 MG CAPS capsule, TAKE 1 CAPSULE BY MOUTH EVERY DAY, Disp: 90 capsule, Rfl: 3   tiotropium (SPIRIVA ) 18 MCG inhalation capsule, Place 18 mcg into inhaler and inhale daily., Disp: , Rfl:    famotidine  (PEPCID ) 40 MG tablet, Take 40 mg by mouth at bedtime., Disp: , Rfl:    KLOR-CON  M20 20 MEQ tablet, TAKE 1 TABLET BY MOUTH TWICE A DAY FOR 14 DAYS, FOLLOWED BY 1 TABLET DAILY (Patient not taking: Reported on 12/17/2023), Disp: 104 tablet, Rfl: 2   OHTUVAYRE  3 MG/2.5ML SUSP, , Disp: , Rfl:    potassium chloride  SA (KLOR-CON  M20) 20 MEQ tablet, Take 1 tablet (20 mEq total) by mouth 2 (two) times daily for 7 days. (Patient not taking: Reported on 12/17/2023), Disp: 14 tablet, Rfl: 0   predniSONE  (DELTASONE ) 10 MG tablet, Take 1 tablet (10 mg total) by mouth daily. Take 20 mg daily x 3 days then resume 10 mg daily afterwards (Patient taking differently: Take 10 mg by mouth daily.), Disp: , Rfl:    traMADol  (ULTRAM ) 50 MG tablet, Take 50 mg by mouth 2 (two) times daily., Disp: , Rfl:   Physical exam:  Vitals:   12/17/23 1010  BP: 136/80  Pulse: 80  Resp: 18  Temp: (!) 96 F (35.6 C)  TempSrc: Tympanic  SpO2: 93%  Weight: 163 lb (73.9 kg)  Height: 5' 7 (1.702 m)   Physical Exam Constitutional:      Comments: Sitting in a wheelchair.  He is on home oxygen  Cardiovascular:     Rate and Rhythm:  Normal rate and regular rhythm.     Heart sounds: Normal heart sounds.  Pulmonary:     Effort: Pulmonary effort is normal.     Breath sounds: Normal breath sounds.  Abdominal:     General: Bowel sounds are normal.     Palpations: Abdomen is soft.  Skin:    General: Skin is warm and dry.  Neurological:     Mental Status: He is alert and oriented to person, place, and time.      I have personally reviewed labs listed below:    Latest Ref Rng & Units 12/17/2023    9:18 AM  CMP  Glucose 70 - 99 mg/dL 846   BUN 8 - 23 mg/dL 12   Creatinine 9.38 - 1.24 mg/dL 9.12   Sodium 864 - 854 mmol/L 138   Potassium 3.5 - 5.1 mmol/L 3.2   Chloride 98 - 111 mmol/L 98   CO2 22 - 32 mmol/L 34   Calcium 8.9 - 10.3 mg/dL 8.7   Total Protein 6.5 - 8.1 g/dL 6.4   Total Bilirubin 0.0 - 1.2 mg/dL 0.8   Alkaline Phos 38 - 126 U/L 52   AST 15 - 41 U/L 21   ALT 0 - 44 U/L 18       Latest Ref Rng & Units 12/17/2023    9:18 AM  CBC  WBC 4.0 - 10.5 K/uL 6.9   Hemoglobin 13.0 - 17.0 g/dL 87.2   Hematocrit 60.9 - 52.0 % 39.2   Platelets 150 - 400 K/uL 222    I have personally reviewed Radiology images listed below: No images are attached to the encounter.  CT CHEST ABDOMEN PELVIS W CONTRAST Result Date: 12/06/2023 CLINICAL DATA:  Restaging bladder cancer.  * Tracking Code: BO * EXAM: CT CHEST, ABDOMEN, AND PELVIS WITH CONTRAST TECHNIQUE: Multidetector  CT imaging of the chest, abdomen and pelvis was performed following the standard protocol during bolus administration of intravenous contrast. RADIATION DOSE REDUCTION: This exam was performed according to the departmental dose-optimization program which includes automated exposure control, adjustment of the mA and/or kV according to patient size and/or use of iterative reconstruction technique. CONTRAST:  100mL OMNIPAQUE  IOHEXOL  300 MG/ML  SOLN COMPARISON:  Multiple prior imaging studies. The most recent CT scan is 05/19/2018 FINDINGS: CT CHEST FINDINGS  Cardiovascular: The heart is normal in size. No pericardial effusion. The aorta is normal in caliber. No dissection. The branch vessels are patent. Stable 3 vessel coronary artery calcifications. Mediastinum/Nodes: No mediastinal or hilar mass or lymphadenopathy. The esophagus is unremarkable. The thyroid gland is. The right IJ Port-A-Cath is stable. Lungs/Pleura: Stable emphysematous changes and pulmonary scarring. Stable scattered calcified granulomas. Stable nodularity at the left lung base. No worrisome pulmonary lesions or nodules to suggest metastatic disease. Musculoskeletal: No chest wall mass, supraclavicular or axillary adenopathy. The bony thorax is intact. No worrisome lytic or sclerotic bone lesions to suggest metastatic disease. Stable compression deformity of T8. CT ABDOMEN PELVIS FINDINGS Hepatobiliary: Stable peripheral enhancing lesion at the right hepatic dome likely a vascular shunt. Stable small low-attenuation lesion in the inferior aspect of the right hepatic lobe consistent with a benign cyst. No worrisome hepatic lesions to suggest metastatic disease. No intrahepatic biliary dilatation the gallbladder is unremarkable. No common bile duct dilatation. Pancreas: Unremarkable. No pancreatic ductal dilatation or surrounding inflammatory changes. Spleen: Normal in size without focal abnormality. Adrenals/Urinary Tract: No worrisome renal lesions hydronephrosis. Stable small renal cysts not requiring any further imaging evaluation or follow-up. Renal cortical scarring changes bilaterally, unchanged. The bladder contains gas which could be related to recent instrumentation or catheterization. No worrisome bladder lesions or asymmetric bladder wall thickening. Stomach/Bowel: The stomach, duodenum, small bowel and colon are grossly normal without oral contrast. No inflammatory changes, mass lesions or obstructive findings. The appendix is surgically absent. Stable sigmoid colon diverticulosis. Stable  surgical changes involving the right colon and rectosigmoid junction. Vascular/Lymphatic: Stable atherosclerotic calcifications involving the aorta and branch vessels but no aneurysm or dissection. No mesenteric or retroperitoneal mass or adenopathy. The major venous structures are patent. Reproductive: The prostate gland and seminal vesicles are unremarkable. Other: No pelvic mass or adenopathy. No free pelvic fluid collections. No inguinal mass or adenopathy. No abdominal wall hernia or subcutaneous lesions. Musculoskeletal: No significant bony findings. IMPRESSION: 1. Stable CT appearance of the chest, abdomen and pelvis. No findings for recurrent tumor, locoregional adenopathy or metastatic disease. 2. Stable emphysematous changes and pulmonary scarring. 3. 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 4. Stable atherosclerotic calcifications involving the thoracic and abdominal aorta and branch vessels including the coronary arteries. Aortic Atherosclerosis (ICD10-I70.0) and Emphysema (ICD10-J43.9). Electronically Signed   By: MYRTIS Stammer M.D.   On: 12/06/2023 10:59     Assessment and plan- Patient is a 78 y.o. male  with history of muscle invasive bladder cancer s/p concurrent chemoradiation in March 2022 currently in remission.  He is here for routine surveillance visit of bladder cancer  I have reviewed CT chest abdomen pelvis images independently and discussed findings with the patient which does not show any evidence of recurrent or progressive disease.  Stable emphysematous changes in the lungs and atherosclerotic calcifications involving coronary arteries.  We are a little over 3 years from the time of completing adjuvant treatment.  I will plan to get repeat scans in 6 months and  see him thereafter with labs.    Of note patient has chronic hypokalemia unrelated to his history of bladder cancer.  His potassium level is 3.2 today.  I am sending him 1 weeks worth of oral potassium but this  needs to be addressed by his primary care doctor in the future if he needs to stay on long-term potassium   Visit Diagnosis 1. Encounter for follow-up surveillance of bladder cancer   2. Hypokalemia      Dr. Annah Skene, MD, MPH [facility] at [facility] [phone] 12/17/2023 2:21 PM

## 2023-12-30 ENCOUNTER — Other Ambulatory Visit: Payer: Self-pay

## 2023-12-30 DIAGNOSIS — Z860100 Personal history of colon polyps, unspecified: Secondary | ICD-10-CM

## 2023-12-30 MED ORDER — NA SULFATE-K SULFATE-MG SULF 17.5-3.13-1.6 GM/177ML PO SOLN: 1.0000 | Freq: Once | ORAL | 0 refills | Status: AC

## 2023-12-30 NOTE — Addendum Note (Signed)
 Addended by: CURTISS [name] RAMAN on: 12/30/2023 10:50 AM   Modules accepted: Orders

## 2023-12-30 NOTE — Telephone Encounter (Signed)
 Gastroenterology Pre-Procedure Review  Request Date: 03/25/24 Requesting Physician: Dr. Jinny  PATIENT REVIEW QUESTIONS: The patient responded to the following health history questions as indicated:    1. Are you having any GI issues? no 2. Do you have a personal history of Polyps? yes (last colonoscopy performed by Dr. Therisa 01/16/21 recommended repeat iin 3 years) 3. Do you have a family history of Colon Cancer or Polyps? no 4. Diabetes Mellitus? yes (takes metformin has been advised and noted on instructions to stop 2 days before) 5. Joint replacements in the past 12 months?no 6. Major health problems in the past 3 months?no 7. Any artificial heart valves, MVP, or defibrillator?no 8. Cardiac clearance requested from [facility] due to history of chronic systolic heart failure 9. Pulmonary clearance requested from [facility] due to COPD.    MEDICATIONS & ALLERGIES:    Patient reports the following regarding taking any anticoagulation/antiplatelet therapy:   Plavix, Coumadin, Eliquis, Xarelto, Lovenox , Pradaxa, Brilinta, or Effient? no Aspirin? no  Patient confirms/reports the following medications:  Current Outpatient Medications  Medication Sig Dispense Refill   acetaminophen  (TYLENOL ) 500 MG tablet Take 500 mg by mouth every 8 (eight) hours as needed for moderate pain.     albuterol  (PROVENTIL  HFA;VENTOLIN  HFA) 108 (90 Base) MCG/ACT inhaler Inhale 2 puffs into the lungs every 4 (four) hours as needed for wheezing or shortness of breath.     albuterol  (PROVENTIL ) (2.5 MG/3ML) 0.083% nebulizer solution Take 2.5 mg by nebulization every 4 (four) hours as needed for wheezing or shortness of breath.     amLODipine  (NORVASC ) 10 MG tablet Take 10 mg by mouth at bedtime.     arformoterol  (BROVANA ) 15 MCG/2ML NEBU Take 15 mcg by nebulization 2 (two) times daily.     budesonide  (PULMICORT ) 0.5 MG/2ML nebulizer solution Take 0.5 mg by nebulization 2 (two) times daily.     cetirizine  (ZYRTEC) 10 MG tablet Take 10 mg by mouth at bedtime.     Cholecalciferol (VITAMIN D-3) 25 MCG (1000 UT) CAPS Take 1 capsule by mouth at bedtime.     diclofenac  Sodium (VOLTAREN ) 1 % GEL Apply 1 application topically 2 (two) times daily as needed (pain).     famotidine  (PEPCID ) 40 MG tablet Take 40 mg by mouth at bedtime.     fluticasone  (FLONASE ) 50 MCG/ACT nasal spray Place 1 spray into both nostrils daily.     hydrochlorothiazide  (HYDRODIURIL ) 25 MG tablet Take 25 mg by mouth daily.     KLOR-CON  M20 20 MEQ tablet TAKE 1 TABLET BY MOUTH TWICE A DAY FOR 14 DAYS, FOLLOWED BY 1 TABLET DAILY (Patient not taking: Reported on 12/17/2023) 104 tablet 2   lidocaine -prilocaine  (EMLA ) cream Apply to affected area once 30 g 3   losartan  (COZAAR ) 25 MG tablet Take 25 mg by mouth daily.     metFORMIN (GLUCOPHAGE) 500 MG tablet Take 500 mg by mouth daily.     montelukast  (SINGULAIR ) 10 MG tablet Take 10 mg by mouth at bedtime.     OHTUVAYRE  3 MG/2.5ML SUSP      OXYGEN Inhale 3 L into the lungs continuous.     pantoprazole  (PROTONIX ) 40 MG tablet Take 40 mg by mouth daily.     potassium chloride  SA (KLOR-CON  M) 20 MEQ tablet Take 1 tablet (20 mEq total) daily for 7 days. 7 tablet 0   potassium chloride  SA (KLOR-CON  M20) 20 MEQ tablet Take 1 tablet (20 mEq total) by mouth 2 (two) times daily for  7 days. (Patient not taking: Reported on 12/17/2023) 14 tablet 0   predniSONE  (DELTASONE ) 10 MG tablet Take 1 tablet (10 mg total) by mouth daily. Take 20 mg daily x 3 days then resume 10 mg daily afterwards (Patient taking differently: Take 10 mg by mouth daily.)     simvastatin  (ZOCOR ) 20 MG tablet Take 20 mg by mouth daily at 6 PM.     tamsulosin  (FLOMAX ) 0.4 MG CAPS capsule TAKE 1 CAPSULE BY MOUTH EVERY DAY 90 capsule 3   tiotropium (SPIRIVA ) 18 MCG inhalation capsule Place 18 mcg into inhaler and inhale daily.     traMADol  (ULTRAM ) 50 MG tablet Take 50 mg by mouth 2 (two) times daily.     No current  facility-administered medications for this visit.    Patient confirms/reports the following allergies:  Allergies  Allergen Reactions   Dupilumab  Dermatitis    No orders of the defined types were placed in this encounter.   AUTHORIZATION INFORMATION Primary Insurance: 1D#: Group #:  Secondary Insurance: 1D#: Group #:  SCHEDULE INFORMATION: Date: 03/25/24 Time: Location: [facility]

## 2024-01-06 ENCOUNTER — Telehealth: Payer: Self-pay

## 2024-01-06 NOTE — Telephone Encounter (Signed)
 Pulmonary clearance received from Dr.Fuad Aleskerov-patient is NOT CLEARED to have Colonoscopy- patient is barely breathing on oxygen.   Cardiac Clearance received from Dr.Sabina Custovic -patient is cleared to have procedure. [name] will call patients daughter to let her know Colonoscopy is cancelled for now.      Voice message has been left with patients daughter Joe Torres to make her aware that we had to cancel her father's colonoscopy due to compromised pulmonary functioning.  Thanks,  [name], CMA

## 2024-01-06 NOTE — Telephone Encounter (Signed)
 Pulmonary clearance received from Dr.Fuad Aleskerov-patient is NOT CLEARED to have Colonoscopy- patient is barely breathing on oxygen.  Cardiac Clearance received from Dr.Sabina Custovic -patient is cleared to have procedure. [name] will call patients daughter to let her know Colonoscopy is cancelled for now.

## 2024-01-28 ENCOUNTER — Telehealth: Payer: Self-pay | Admitting: Oncology

## 2024-01-28 DIAGNOSIS — Z95828 Presence of other vascular implants and grafts: Secondary | ICD-10-CM

## 2024-01-28 NOTE — Telephone Encounter (Signed)
 Pts daughter called and said that her dad is wanting to get his port removed if it is okay with you. I told her I would send a message to Dr. Melanee and let her know.

## 2024-01-28 NOTE — Telephone Encounter (Signed)
 Okay to work on port removal

## 2024-01-29 NOTE — Addendum Note (Signed)
 Addended by: Alverto Shedd on: 01/29/2024 03:33 PM   Modules accepted: Orders

## 2024-01-29 NOTE — Telephone Encounter (Addendum)
 Referral sent to vascular surgery to have port removed; port placed by Dr. Selinda Gu on 06/19/20.  Secure chat also sent to Leita Nose.  Outbound call informed daughter of above.

## 2024-01-30 ENCOUNTER — Telehealth (INDEPENDENT_AMBULATORY_CARE_PROVIDER_SITE_OTHER): Payer: Self-pay

## 2024-01-30 NOTE — Telephone Encounter (Signed)
 Spoke with the patient's daughter and he is scheduled with Dr. Marea for a port removal on 02/09/24 with a 8:30 am arrival time to the [facility]. Pre-procedure instructions were discussed and will be sent to Mychart and mailed.

## 2024-02-05 ENCOUNTER — Other Ambulatory Visit: Payer: Self-pay | Admitting: Oncology

## 2024-02-05 DIAGNOSIS — E876 Hypokalemia: Secondary | ICD-10-CM

## 2024-02-06 NOTE — Telephone Encounter (Signed)
 Outbound call; spoke to Tammy who indicated they're still waiting to hear on a refill request and it's not coming in.  Verbal called in with the following information: TAKE 1 TABLET BY MOUTH EVERY DAY FOR 7 DAYS Dispense: 7 tablet, Refills: 0 ordered.

## 2024-02-09 ENCOUNTER — Other Ambulatory Visit: Payer: Self-pay

## 2024-02-09 ENCOUNTER — Encounter
Admission: RE | Disposition: A | Discharge status: Home / Self Care | Payer: Self-pay | Source: Home / Self Care | Attending: Vascular Surgery

## 2024-02-09 ENCOUNTER — Encounter: Payer: Self-pay | Admitting: Vascular Surgery

## 2024-02-09 ENCOUNTER — Telehealth: Payer: Self-pay | Admitting: Oncology

## 2024-02-09 ENCOUNTER — Ambulatory Visit
Admission: RE | Admit: 2024-02-09 | Discharge: 2024-02-09 | Disposition: A | Discharge status: Home / Self Care | Attending: Vascular Surgery | Admitting: Vascular Surgery

## 2024-02-09 DIAGNOSIS — C679 Malignant neoplasm of bladder, unspecified: Secondary | ICD-10-CM

## 2024-02-09 DIAGNOSIS — J4489 Other specified chronic obstructive pulmonary disease: Secondary | ICD-10-CM | POA: Diagnosis not present

## 2024-02-09 DIAGNOSIS — Z87891 Personal history of nicotine dependence: Secondary | ICD-10-CM | POA: Diagnosis not present

## 2024-02-09 DIAGNOSIS — Z452 Encounter for adjustment and management of vascular access device: Secondary | ICD-10-CM

## 2024-02-09 DIAGNOSIS — Z9981 Dependence on supplemental oxygen: Secondary | ICD-10-CM | POA: Insufficient documentation

## 2024-02-09 HISTORY — PX: PORTA CATH REMOVAL: CATH118286

## 2024-02-09 LAB — GLUCOSE, CAPILLARY: Glucose-Capillary: 120 mg/dL — ABNORMAL HIGH (ref 70–99)

## 2024-02-09 SURGERY — PORTA CATH REMOVAL
Anesthesia: Moderate Sedation

## 2024-02-09 MED ORDER — FENTANYL CITRATE PF 50 MCG/ML IJ SOSY
PREFILLED_SYRINGE | INTRAMUSCULAR | Status: AC
  Filled 2024-02-09: qty 1

## 2024-02-09 MED ORDER — HYDROMORPHONE HCL 1 MG/ML IJ SOLN: 1.0000 mg | Freq: Once | INTRAMUSCULAR | Status: DC | PRN

## 2024-02-09 MED ORDER — METHYLPREDNISOLONE SODIUM SUCC 125 MG IJ SOLR: 125.0000 mg | Freq: Once | INTRAMUSCULAR | Status: DC | PRN

## 2024-02-09 MED ORDER — MIDAZOLAM HCL 2 MG/ML PO SYRP: 8.0000 mg | ORAL_SOLUTION | Freq: Once | ORAL | Status: DC | PRN

## 2024-02-09 MED ORDER — FENTANYL CITRATE (PF) 100 MCG/2ML IJ SOLN
INTRAMUSCULAR | Status: DC | PRN
  Administered 2024-02-09: 50 ug via INTRAVENOUS

## 2024-02-09 MED ORDER — ONDANSETRON HCL 4 MG/2ML IJ SOLN: 4.0000 mg | Freq: Four times a day (QID) | INTRAMUSCULAR | Status: DC | PRN

## 2024-02-09 MED ORDER — DIPHENHYDRAMINE HCL 50 MG/ML IJ SOLN: 50.0000 mg | Freq: Once | INTRAMUSCULAR | Status: DC | PRN

## 2024-02-09 MED ORDER — LIDOCAINE-EPINEPHRINE (PF) 1 %-1:200000 IJ SOLN
INTRAMUSCULAR | Status: DC | PRN
  Administered 2024-02-09 (×2): 20 mL

## 2024-02-09 MED ORDER — MIDAZOLAM HCL 2 MG/2ML IJ SOLN
INTRAMUSCULAR | Status: DC | PRN
  Administered 2024-02-09 (×2): 1 mg via INTRAVENOUS

## 2024-02-09 MED ORDER — CEFAZOLIN SODIUM-DEXTROSE 2-4 GM/100ML-% IV SOLN
2.0000 g | INTRAVENOUS | Status: DC
Start: 2024-02-09 — End: 2024-02-09

## 2024-02-09 MED ORDER — MIDAZOLAM HCL 2 MG/2ML IJ SOLN
INTRAMUSCULAR | Status: AC
  Filled 2024-02-09: qty 2

## 2024-02-09 MED ORDER — SODIUM CHLORIDE 0.9 % IV SOLN: INTRAVENOUS | Status: DC

## 2024-02-09 MED ORDER — FAMOTIDINE 20 MG PO TABS: 40.0000 mg | ORAL_TABLET | Freq: Once | ORAL | Status: DC | PRN

## 2024-02-09 SURGICAL SUPPLY — 4 items
DERMABOND ADVANCED .7 DNX12 (GAUZE/BANDAGES/DRESSINGS) IMPLANT
PENCIL ELECTRO HAND CTR (MISCELLANEOUS) IMPLANT
SUT MNCRL AB 4-0 PS2 18 (SUTURE) IMPLANT
SUT VIC AB 3-0 SH 27X BRD (SUTURE) IMPLANT

## 2024-02-09 NOTE — Op Note (Signed)
 [facility]       Operative Note  Date: 02/09/2024  Preoperative diagnosis:  1. Bladder cancer, no longer using port  Postoperative diagnosis:  Same as above  Procedures: #1. Removal of right jugular port a cath   Surgeon: Selinda Gu, MD  Anesthesia: Local with moderate conscious sedation for 20 minutes using 2 mg of Versed  and 50 mcg of Fentanyl   Fluoroscopy time: none  Contrast used: 0  Estimated blood loss: Minimal  Indication for the procedure:  The patient is a 78 y.o. male who has completed his therapy for bladder cancer and no longer needs their Port-A-Cath. The patient desires to have this removed. Risks and benefits including need for potential replacement with recurrent disease were discussed and patient is agreeable to proceed.  Description of procedure: The patient was brought to the vascular and interventional radiology suite. Moderate conscious sedation was administered during a face to face encounter with the patient throughout the procedure with my supervision of the RN administering medicines and monitoring the patient's vital signs, pulse oximetry, telemetry and mental status throughout from the start of the procedure until the patient was taken to the recovery room.  The right neck chest and shoulder were sterilely prepped and draped, and a sterile surgical field was created. The area was then anesthetized with 1% lidocaine  copiously. The previous incision was reopened and electrocautery used to dissected down to the port and the catheter. These were dissected free and the catheter was gently removed from the vein in its entirety. The port was dissected out from the fibrous connective tissue and the Prolene sutures were removed. The port was then removed in its entirety including the catheter. The wound was then closed with a 3-0 Vicryl and a 4-0 Monocryl and Dermabond was placed as a dressing. The patient was  then taken to the recovery room in stable condition having tolerated the procedure well.  Complications: none  Condition: stable   Selinda Gu, MD 02/09/2024 10:04 AM   This note was created with Dragon Medical transcription system. Any errors in dictation are purely unintentional.

## 2024-02-09 NOTE — Telephone Encounter (Signed)
 Pt daughter Macky) called and said that pt had port removed so will not need port flush appts - LH

## 2024-02-09 NOTE — H&P (Signed)
 [facility] Admission History & Physical  MRN : [id number]  Joe Torres is a 78 y.o. ([birth year]) male who presents with chief complaint of No chief complaint on file. SABRA  History of Present Illness: Patient presents today for removal of his Port-A-Cath.  This was placed about 3 to 4 years ago for his bladder cancer.  He no longer needs it for therapy.  Current Facility-Administered Medications  Medication Dose Route Frequency Provider Last Rate Last Admin   0.9 %  sodium chloride  infusion   Intravenous Continuous Brown, Fallon E, NP 75 mL/hr at 02/09/24 0859 New Bag at 02/09/24 0859   diphenhydrAMINE  (BENADRYL ) injection 50 mg  50 mg Intravenous Once PRN Brown, Fallon E, NP       famotidine  (PEPCID ) tablet 40 mg  40 mg Oral Once PRN Brown, Fallon E, NP       HYDROmorphone  (DILAUDID ) injection 1 mg  1 mg Intravenous Once PRN Brown, Fallon E, NP       lidocaine -EPINEPHrine  (PF) (XYLOCAINE -EPINEPHrine ) 1 %-1:200000 (PF) injection    PRN Marea Selinda RAMAN, MD   20 mL at 02/09/24 9076   methylPREDNISolone  sodium succinate (SOLU-MEDROL ) 125 mg/2 mL injection 125 mg  125 mg Intravenous Once PRN Brown, Fallon E, NP       midazolam  (VERSED ) 2 MG/ML syrup 8 mg  8 mg Oral Once PRN Brown, Fallon E, NP       ondansetron  (ZOFRAN ) injection 4 mg  4 mg Intravenous Q6H PRN Brown, Fallon E, NP        Past Medical History:  Diagnosis Date   Arthritis    Asthma    Bladder cancer (HCC)    BPH (benign prostatic hyperplasia)    Cancer (HCC)    Cataracts, bilateral    COPD, severe (HCC)    Diverticulitis of large intestine with perforation without bleeding    Dyspnea    GERD (gastroesophageal reflux disease)    Hyperlipidemia    Hypertension    Obstructive sleep apnea    C-pap with oxygen   Osteoarthritis    Panlobular emphysema (HCC)    Postoperative ileus (HCC)    Right inguinal hernia     Past Surgical History:  Procedure Laterality Date   APPENDECTOMY  02/12/2023    Procedure: APPENDECTOMY;  Surgeon: Rodolph Romano, MD;  Location: [facility] ORS;  Service: General;;   CATARACT EXTRACTION W/ INTRAOCULAR LENS IMPLANT Right 05/18/2013   CATARACT EXTRACTION W/ INTRAOCULAR LENS IMPLANT Left 07/06/2013   COLONOSCOPY     COLONOSCOPY WITH PROPOFOL  N/A 01/16/2021   Procedure: COLONOSCOPY WITH PROPOFOL ;  Surgeon: Therisa Bi, MD;  [facility] END[facility];  Service: Gastroenterology;  Laterality: N/A;  SPANISH INTERPRETER 9 AM ARRIVAL REQUEST   ILEOSTOMY N/A 11/03/2022   Procedure: ILEOSTOMY;  Surgeon: Rodolph Romano, MD;  Location: [facility] ORS;  Service: General;  Laterality: N/A;   ILEOSTOMY CLOSURE N/A 02/12/2023   Procedure: ILEOSTOMY TAKEDOWN;  Surgeon: Rodolph Romano, MD;  Location: [facility] ORS;  Service: General;  Laterality: N/A;   INGUINAL HERNIA REPAIR Right 01/08/2016   LAPAROTOMY N/A 10/20/2022   Procedure: EXPLORATORY LAPAROTOMY,  SIGMOID COLECTOMY, LOOP ILEOSTOMY;  Surgeon: Rodolph Romano, MD;  Location: [facility] ORS;  Service: General;  Laterality: N/A;   LAPAROTOMY N/A 02/15/2023   Procedure: EXPLORATORY LAPAROTOMY;  Surgeon: Rodolph Romano, MD;  Location: [facility] ORS;  Service: General;  Laterality: N/A;   PORTA CATH INSERTION N/A 06/19/2020   Procedure: PORTA CATH INSERTION;  Surgeon: Marea Selinda RAMAN, MD;  Location: [facility] INVASIVE [facility] LAB;  Service: Cardiovascular;  Laterality: N/A;   TRANSURETHRAL RESECTION OF BLADDER TUMOR WITH GYRUS (TURBT-GYRUS)  07/2020   TRANSURETHRAL RESECTION OF BLADDER TUMOR WITH MITOMYCIN -C N/A 05/22/2020   Procedure: TRANSURETHRAL RESECTION OF BLADDER TUMOR WITH Gemcitabine ;  Surgeon: Penne Knee, MD;  Location: [facility] ORS;  Service: Urology;  Laterality: N/A;     Social History   Tobacco Use   Smoking status: Former    Current packs/day: 0.00    Average packs/day: 1.5 packs/day for 40.0 years (60.0 ttl pk-yrs)    Types: Cigarettes    Start date: 05/21/1968    Quit date: 05/21/2008    Years since  quitting: 15.7    Passive exposure: Past   Smokeless tobacco: Never  Vaping Use   Vaping status: Never Used  Substance Use Topics   Alcohol use: Not Currently   Drug use: Never     Family History  Problem Relation Age of Onset   Asthma Mother     Allergies  Allergen Reactions   Shrimp [Shellfish Allergy] Anaphylaxis    Per MD comments critical   Dupilumab  Dermatitis     REVIEW OF SYSTEMS (Negative unless checked)  Constitutional: [] Weight loss  [] Fever  [] Chills Cardiac: [] Chest pain   [] Chest pressure   [] Palpitations   [] Shortness of breath when laying flat   [] Shortness of breath at rest   [] Shortness of breath with exertion. Vascular:  [x] Pain in legs with walking   [] Pain in legs at rest   [] Pain in legs when laying flat   [] Claudication   [] Pain in feet when walking  [] Pain in feet at rest  [] Pain in feet when laying flat   [] History of DVT   [] Phlebitis   [] Swelling in legs   [] Varicose veins   [] Non-healing ulcers Pulmonary:   [x] Uses home oxygen   [] Productive cough   [] Hemoptysis   [] Wheeze  [x] COPD   [] Asthma Neurologic:  [] Dizziness  [] Blackouts   [] Seizures   [] History of stroke   [] History of TIA  [] Aphasia   [] Temporary blindness   [] Dysphagia   [] Weakness or numbness in arms   [] Weakness or numbness in legs Musculoskeletal:  [x] Arthritis   [] Joint swelling   [x] Joint pain   [] Low back pain Hematologic:  [] Easy bruising  [] Easy bleeding   [] Hypercoagulable state   [] Anemic  [] Hepatitis Gastrointestinal:  [] Blood in stool   [] Vomiting blood  [] Gastroesophageal reflux/heartburn   [] Difficulty swallowing. Genitourinary:  [] Chronic kidney disease   [] Difficult urination  [] Frequent urination  [] Burning with urination   [] Blood in urine Skin:  [] Rashes   [] Ulcers   [] Wounds Psychological:  [] History of anxiety   []  History of major depression.  Physical Examination  Vitals:   02/09/24 0858  BP: (!) 144/91  Pulse: 83  Resp: 16  Temp: (!) 97 F (36.1 C)   TempSrc: Oral  SpO2: 96%  Weight: 72.6 kg  Height: 5' 7 (1.702 m)   Body mass index is 25.06 kg/m. Gen: WD/WN, NAD Head: [place]/AT, No temporalis wasting.  Ear/Nose/Throat: Hearing grossly intact, nares w/o erythema or drainage, oropharynx w/o Erythema/Exudate,  Eyes: Conjunctiva clear, sclera non-icteric Neck: Trachea midline.  No JVD.  Pulmonary:  Good air movement, respirations not labored, no use of accessory muscles.  Cardiac: RRR, normal S1, S2. Vascular:  Vessel Right Left  Radial Palpable Palpable           Musculoskeletal: M/S 5/5 throughout.  Extremities without ischemic changes.  No deformity or atrophy.  Neurologic: Sensation grossly intact in extremities.  Symmetrical.  Speech is fluent. Motor exam as listed above. Psychiatric: Judgment intact, Mood & affect appropriate for pt's clinical situation. Dermatologic: No rashes or ulcers noted.  No cellulitis or open wounds.      CBC Lab Results  Component Value Date   WBC 6.9 12/17/2023   HGB 12.7 (L) 12/17/2023   HCT 39.2 12/17/2023   MCV 92.0 12/17/2023   PLT 222 12/17/2023    BMET    Component Value Date/Time   NA 138 12/17/2023 0918   K 3.2 (L) 12/17/2023 0918   CL 98 12/17/2023 0918   CO2 34 (H) 12/17/2023 0918   GLUCOSE 153 (H) 12/17/2023 0918   BUN 12 12/17/2023 0918   CREATININE 0.87 12/17/2023 0918   CALCIUM 8.7 (L) 12/17/2023 0918   GFRNONAA >60 12/17/2023 0918   CrCl cannot be calculated (Patient's most recent lab result is older than the maximum 21 days allowed.).  COAG No results found for: INR, PROTIME  Radiology No results found.   Assessment/Plan 1.  Bladder cancer.  Done with therapy.  Desires to have port removed.  Can remove today. 2.  Port-A-Cath in place.  Desires to have it removed.  Can remove today. 3.  COPD.  Severe.  Uses oxygen.   Selinda Gu, MD  02/09/2024 9:26 AM

## 2024-02-11 ENCOUNTER — Encounter

## 2024-02-11 ENCOUNTER — Other Ambulatory Visit: Payer: Self-pay | Admitting: Urology

## 2024-03-18 ENCOUNTER — Emergency Department

## 2024-03-18 ENCOUNTER — Inpatient Hospital Stay
Admission: EM | Admit: 2024-03-18 | Discharge: 2024-03-21 | DRG: 189 | Disposition: A | Discharge status: Home / Self Care | Attending: Internal Medicine | Admitting: Internal Medicine

## 2024-03-18 ENCOUNTER — Other Ambulatory Visit: Payer: Self-pay

## 2024-03-18 DIAGNOSIS — G8929 Other chronic pain: Secondary | ICD-10-CM | POA: Diagnosis present

## 2024-03-18 DIAGNOSIS — E782 Mixed hyperlipidemia: Secondary | ICD-10-CM | POA: Diagnosis present

## 2024-03-18 DIAGNOSIS — Z825 Family history of asthma and other chronic lower respiratory diseases: Secondary | ICD-10-CM

## 2024-03-18 DIAGNOSIS — J9621 Acute and chronic respiratory failure with hypoxia: Secondary | ICD-10-CM | POA: Diagnosis not present

## 2024-03-18 DIAGNOSIS — R9431 Abnormal electrocardiogram [ECG] [EKG]: Secondary | ICD-10-CM

## 2024-03-18 DIAGNOSIS — Z87891 Personal history of nicotine dependence: Secondary | ICD-10-CM

## 2024-03-18 DIAGNOSIS — J441 Chronic obstructive pulmonary disease with (acute) exacerbation: Secondary | ICD-10-CM | POA: Diagnosis not present

## 2024-03-18 DIAGNOSIS — Z888 Allergy status to other drugs, medicaments and biological substances status: Secondary | ICD-10-CM

## 2024-03-18 DIAGNOSIS — J431 Panlobular emphysema: Secondary | ICD-10-CM | POA: Diagnosis present

## 2024-03-18 DIAGNOSIS — Z8551 Personal history of malignant neoplasm of bladder: Secondary | ICD-10-CM

## 2024-03-18 DIAGNOSIS — K219 Gastro-esophageal reflux disease without esophagitis: Secondary | ICD-10-CM | POA: Diagnosis present

## 2024-03-18 DIAGNOSIS — J9601 Acute respiratory failure with hypoxia: Secondary | ICD-10-CM | POA: Diagnosis present

## 2024-03-18 DIAGNOSIS — Z79899 Other long term (current) drug therapy: Secondary | ICD-10-CM

## 2024-03-18 DIAGNOSIS — Z9981 Dependence on supplemental oxygen: Secondary | ICD-10-CM

## 2024-03-18 DIAGNOSIS — J9622 Acute and chronic respiratory failure with hypercapnia: Secondary | ICD-10-CM | POA: Diagnosis present

## 2024-03-18 DIAGNOSIS — Z91013 Allergy to seafood: Secondary | ICD-10-CM

## 2024-03-18 DIAGNOSIS — Z932 Ileostomy status: Secondary | ICD-10-CM

## 2024-03-18 DIAGNOSIS — N400 Enlarged prostate without lower urinary tract symptoms: Secondary | ICD-10-CM | POA: Diagnosis present

## 2024-03-18 DIAGNOSIS — Z1152 Encounter for screening for COVID-19: Secondary | ICD-10-CM

## 2024-03-18 DIAGNOSIS — G4733 Obstructive sleep apnea (adult) (pediatric): Secondary | ICD-10-CM | POA: Diagnosis present

## 2024-03-18 DIAGNOSIS — Z7951 Long term (current) use of inhaled steroids: Secondary | ICD-10-CM

## 2024-03-18 DIAGNOSIS — Z7952 Long term (current) use of systemic steroids: Secondary | ICD-10-CM

## 2024-03-18 DIAGNOSIS — Z7984 Long term (current) use of oral hypoglycemic drugs: Secondary | ICD-10-CM

## 2024-03-18 DIAGNOSIS — I10 Essential (primary) hypertension: Secondary | ICD-10-CM | POA: Diagnosis present

## 2024-03-18 DIAGNOSIS — E119 Type 2 diabetes mellitus without complications: Secondary | ICD-10-CM | POA: Diagnosis present

## 2024-03-18 DIAGNOSIS — C678 Malignant neoplasm of overlapping sites of bladder: Secondary | ICD-10-CM | POA: Diagnosis present

## 2024-03-18 LAB — COMPREHENSIVE METABOLIC PANEL WITH GFR
ALT: 19 U/L (ref 0–44)
AST: 23 U/L (ref 15–41)
Albumin: 3.9 g/dL (ref 3.5–5.0)
Alkaline Phosphatase: 64 U/L (ref 38–126)
Anion gap: 13 (ref 5–15)
BUN: 15 mg/dL (ref 8–23)
CO2: 31 mmol/L (ref 22–32)
Calcium: 9.1 mg/dL (ref 8.9–10.3)
Chloride: 97 mmol/L — ABNORMAL LOW (ref 98–111)
Creatinine, Ser: 0.85 mg/dL (ref 0.61–1.24)
GFR, Estimated: >60 mL/min (ref >60)
Glucose, Bld: 163 mg/dL — ABNORMAL HIGH (ref 70–99)
Potassium: 3.8 mmol/L (ref 3.5–5.1)
Sodium: 141 mmol/L (ref 135–145)
Total Bilirubin: 0.7 mg/dL (ref 0.0–1.2)
Total Protein: 6.7 g/dL (ref 6.5–8.1)

## 2024-03-18 LAB — RESP PANEL BY RT-PCR (RSV, FLU A&B, COVID)  RVPGX2
Influenza A by PCR: NEGATIVE
Influenza B by PCR: NEGATIVE
Resp Syncytial Virus by PCR: NEGATIVE
SARS Coronavirus 2 by RT PCR: NEGATIVE

## 2024-03-18 LAB — BLOOD GAS, VENOUS
Acid-Base Excess: 10 mmol/L — ABNORMAL HIGH (ref 0.0–2.0)
Bicarbonate: 38.8 mmol/L — ABNORMAL HIGH (ref 20.0–28.0)
Delivery systems: POSITIVE
O2 Saturation: 46.4 %
Patient temperature: 37
pCO2, Ven: 72 mmHg (ref 44–60)
pH, Ven: 7.34 (ref 7.25–7.43)
pO2, Ven: 34 mmHg (ref 32–45)

## 2024-03-18 LAB — CBC WITH DIFFERENTIAL/PLATELET
Abs Immature Granulocytes: 0.11 K/uL — ABNORMAL HIGH (ref 0.00–0.07)
Basophils Absolute: 0 K/uL (ref 0.0–0.1)
Basophils Relative: 0 %
Eosinophils Absolute: 0.1 K/uL (ref 0.0–0.5)
Eosinophils Relative: 1 %
HCT: 40.3 % (ref 39.0–52.0)
Hemoglobin: 12.9 g/dL — ABNORMAL LOW (ref 13.0–17.0)
Immature Granulocytes: 1 %
Lymphocytes Relative: 14 %
Lymphs Abs: 1.6 K/uL (ref 0.7–4.0)
MCH: 29.5 pg (ref 26.0–34.0)
MCHC: 32 g/dL (ref 30.0–36.0)
MCV: 92 fL (ref 80.0–100.0)
Monocytes Absolute: 0.7 K/uL (ref 0.1–1.0)
Monocytes Relative: 6 %
Neutro Abs: 9 K/uL — ABNORMAL HIGH (ref 1.7–7.7)
Neutrophils Relative %: 78 %
Platelets: 237 K/uL (ref 150–400)
RBC: 4.38 MIL/uL (ref 4.22–5.81)
RDW: 14.1 % (ref 11.5–15.5)
WBC: 11.5 K/uL — ABNORMAL HIGH (ref 4.0–10.5)
nRBC: 0 % (ref 0.0–0.2)

## 2024-03-18 LAB — BRAIN NATRIURETIC PEPTIDE: B Natriuretic Peptide: 27.1 pg/mL (ref 0.0–100.0)

## 2024-03-18 LAB — TROPONIN I (HIGH SENSITIVITY)
Troponin I (High Sensitivity): 5 ng/L (ref <18)
Troponin I (High Sensitivity): 5 ng/L (ref <18)

## 2024-03-18 MED ORDER — AZITHROMYCIN 250 MG PO TABS
250.0000 mg | ORAL_TABLET | Freq: Every day | ORAL | Status: DC
  Administered 2024-03-19: 250 mg via ORAL
  Filled 2024-03-18: qty 1

## 2024-03-18 MED ORDER — ORAL CARE MOUTH RINSE: 15.0000 mL | OROMUCOSAL | Status: DC | PRN

## 2024-03-18 MED ORDER — METHYLPREDNISOLONE SODIUM SUCC 40 MG IJ SOLR
40.0000 mg | Freq: Two times a day (BID) | INTRAMUSCULAR | Status: DC
  Administered 2024-03-19 – 2024-03-21 (×5): 40 mg via INTRAVENOUS
  Filled 2024-03-18 (×6): qty 1

## 2024-03-18 MED ORDER — AZITHROMYCIN 250 MG PO TABS
500.0000 mg | ORAL_TABLET | Freq: Every day | ORAL | Status: AC
  Administered 2024-03-19: 500 mg via ORAL
  Filled 2024-03-18: qty 2

## 2024-03-18 MED ORDER — PANTOPRAZOLE SODIUM 40 MG PO TBEC
40.0000 mg | DELAYED_RELEASE_TABLET | Freq: Every day | ORAL | Status: DC
  Administered 2024-03-19 – 2024-03-21 (×3): 40 mg via ORAL
  Filled 2024-03-18 (×3): qty 1

## 2024-03-18 MED ORDER — IPRATROPIUM-ALBUTEROL 0.5-2.5 (3) MG/3ML IN SOLN
6.0000 mL | Freq: Once | RESPIRATORY_TRACT | Status: AC
  Administered 2024-03-18: 6 mL via RESPIRATORY_TRACT

## 2024-03-18 MED ORDER — DM-GUAIFENESIN ER 30-600 MG PO TB12
1.0000 | ORAL_TABLET | Freq: Two times a day (BID) | ORAL | Status: DC
  Administered 2024-03-19 – 2024-03-20 (×4): 1 via ORAL
  Filled 2024-03-18 (×4): qty 1

## 2024-03-18 MED ORDER — IPRATROPIUM-ALBUTEROL 0.5-2.5 (3) MG/3ML IN SOLN
RESPIRATORY_TRACT | Status: AC
  Filled 2024-03-18: qty 6

## 2024-03-18 MED ORDER — ORAL CARE MOUTH RINSE
15.0000 mL | OROMUCOSAL | Status: DC
  Administered 2024-03-19 – 2024-03-20 (×6): 15 mL via OROMUCOSAL

## 2024-03-18 MED ORDER — IPRATROPIUM-ALBUTEROL 0.5-2.5 (3) MG/3ML IN SOLN
3.0000 mL | Freq: Four times a day (QID) | RESPIRATORY_TRACT | Status: DC
  Administered 2024-03-19 (×4): 3 mL via RESPIRATORY_TRACT
  Filled 2024-03-18 (×4): qty 3

## 2024-03-18 MED ORDER — IPRATROPIUM-ALBUTEROL 0.5-2.5 (3) MG/3ML IN SOLN
3.0000 mL | RESPIRATORY_TRACT | Status: DC | PRN
  Administered 2024-03-19: 3 mL via RESPIRATORY_TRACT
  Filled 2024-03-18: qty 3

## 2024-03-18 NOTE — ED Provider Notes (Signed)
 [facility] Provider Note    Event Date/Time   First MD Initiated Contact with Patient 03/18/24 1814     (approximate)   History   Respiratory Distress   HPI  Joe Torres is a 78 year old male with history of COPD, asthma on 3 L home oxygen presenting to the emergency department for evaluation of shortness of breath.  Patient had worsened shortness of breath this afternoon, but family reports that he has been feeling short of breath for the past few days.  On EMS arrival, patient was not hypoxic but had significantly labored respirations and he was placed on CPAP with improvement.  Received 125 of Solu-Medrol , 1 albuterol  treatment, 1 DuoNeb treatments.  Noted to have bilateral wheezing with tripoding.  He was also hypertensive with systolics reported to be around 200 so he had nitro paste applied over his chest.  Daughter notes that a few weeks ago he was placed on antibiotics, told to call his pulmonologist if he was not improving.  Called the office today but with his worsening symptoms presents to the ER.  Reviewed pulmonology visit from 01/28/2024.  At that time he was noted to have phlegm with pain in his throat with concerns for oral thrush.  Was placed on Bactrim , prednisone , nystatin.    Physical Exam   Triage Vital Signs: ED Triage Vitals  Encounter Vitals Group     BP 03/18/24 1812 (!) 158/107     Girls Systolic BP Percentile --      Girls Diastolic BP Percentile --      Boys Systolic BP Percentile --      Boys Diastolic BP Percentile --      Pulse Rate 03/18/24 1817 90     Resp 03/18/24 1817 (!) 27     Temp 03/18/24 1823 97.8 F (36.6 C)     Temp src --      SpO2 03/18/24 1817 98 %     Weight 03/18/24 1813 163 lb (73.9 kg)     Height 03/18/24 1813 5' 7 (1.702 m)     Head Circumference --      Peak Flow --      Pain Score 03/18/24 1812 0     Pain Loc --      Pain Education --      Exclude from Growth Chart --     Most recent vital  signs: Vitals:   03/18/24 2140 03/18/24 2349  BP: 121/78 114/83  Pulse: 86 84  Resp: 19 20  Temp: 97.8 F (36.6 C) 97.6 F (36.4 C)  SpO2: 100% 99%     General: Awake, interactive  CV:  Good peripheral perfusion Resp:  Tachypneic with labored respirations, decreased air movement with expiratory wheezing bilaterally Abd:  Nondistended, soft, nontender Neuro:  Symmetric facial movement, fluid speech   ED Results / Procedures / Treatments   Labs (all labs ordered are listed, but only abnormal results are displayed) Labs Reviewed  CBC WITH DIFFERENTIAL/PLATELET - Abnormal; Notable for the following components:      Result Value   WBC 11.5 (*)    Hemoglobin 12.9 (*)    Neutro Abs 9.0 (*)    Abs Immature Granulocytes 0.11 (*)    All other components within normal limits  COMPREHENSIVE METABOLIC PANEL WITH GFR - Abnormal; Notable for the following components:   Chloride 97 (*)    Glucose, Bld 163 (*)    All other components within normal limits  BLOOD GAS,  VENOUS - Abnormal; Notable for the following components:   pCO2, Ven 72 (*)    Bicarbonate 38.8 (*)    Acid-Base Excess 10.0 (*)    All other components within normal limits  RESP PANEL BY RT-PCR (RSV, FLU A&B, COVID)  [id number]  BRAIN NATRIURETIC PEPTIDE  TROPONIN I (HIGH SENSITIVITY)  TROPONIN I (HIGH SENSITIVITY)     EKG EKG independently reviewed and interpreted by myself demonstrates:  EKG demonstrate sinus tachycardia at a rate of 104, PR 100, QRS 141, QTc 526, significant artifact present, interpreted by computer as acute MI, but suspect this is due to artifact, no STEMI on my review  RADIOLOGY Imaging independently reviewed and interpreted by myself demonstrates:  CXR without focal consolidation  Formal Radiology Read:  DG Chest Portable 1 View Result Date: 03/18/2024 CLINICAL DATA:  Shortness of breath, history of bladder cancer EXAM: PORTABLE CHEST 1 VIEW COMPARISON:  02/12/2023 FINDINGS: Two frontal views  of the chest demonstrate an unremarkable cardiac silhouette. No acute airspace disease, effusion, or pneumothorax. Stable emphysema. No acute bony abnormalities. IMPRESSION: 1. No acute intrathoracic process. Electronically Signed   By: Ozell Daring M.D.   On: 03/18/2024 18:48    PROCEDURES:  Critical Care performed: Yes, see critical care procedure note(s)  CRITICAL CARE Performed by: Nilsa Dade   Total critical care time: 32 minutes  Critical care time was exclusive of separately billable procedures and treating other patients.  Critical care was necessary to treat or prevent imminent or life-threatening deterioration.  Critical care was time spent personally by me on the following activities: development of treatment plan with patient and/or surrogate as well as nursing, discussions with consultants, evaluation of patient's response to treatment, examination of patient, obtaining history from patient or surrogate, ordering and performing treatments and interventions, ordering and review of laboratory studies, ordering and review of radiographic studies, pulse oximetry and re-evaluation of patient's condition.   Procedures   MEDICATIONS ORDERED IN ED: Medications  Oral care mouth rinse (has no administration in time range)  Oral care mouth rinse (has no administration in time range)  ipratropium-albuterol  (DUONEB) 0.5-2.5 (3) MG/3ML nebulizer solution 6 mL ( Nebulization Not Given 03/18/24 1844)     IMPRESSION / MDM / ASSESSMENT AND PLAN / ED COURSE  I reviewed the triage vital signs and the nursing notes.  Differential diagnosis includes, but is not limited to, COPD exacerbation, asthma exacerbation, pneumonia, viral illness, CHF exacerbation, flash pulmonary edema  Patient's presentation is most consistent with acute presentation with potential threat to life or bodily function.  78 year old male presenting to the emergency department for evaluation of shortness of breath.   Arrives on CPAP, taken off when patient arrived but remained with significantly labored respirations, so he was transition to BiPAP though he was without hypoxia.  Was noted to have scattered wheezings.  Realready received steroids, given additional DuoNeb treatments.  X-Taela Charbonneau without obvious pneumonia.  VBG with hypercarbia with CO2 72 but appears largely compensated.  Normal BNP.  Negative viral panel.  CBC with mild leukocytosis.  CMP without critical derangements.   Clinical Course as of 03/18/24 2350  Thu Mar 18, 2024  1931 Patient reassessed and does report significant improved breathing.  Remains with scattered expiratory wheezing but work of breathing is improved.  Did thing patient remains appropriate for admission, but will discuss with RN to see if patient can be transitioned off of BiPAP.  Will reach out to hospitalist team. [NR]  1949 Case discussed with hospitalist team.  They will evaluate for anticipated admission. [NR]    Clinical Course User Index [NR] Levander Slate, MD     FINAL CLINICAL IMPRESSION(S) / ED DIAGNOSES   Final diagnoses:  COPD exacerbation (HCC)  Acute respiratory failure with hypoxia (HCC)     Rx / DC Orders   ED Discharge Orders     None        Note:  This document was prepared using Dragon voice recognition software and may include unintentional dictation errors.   Levander Slate, MD 03/18/24 2350

## 2024-03-18 NOTE — H&P (Signed)
 History and Physical    Patient: Joe Torres FMW:[id number] DOB: 1945/12/28 DOA: 03/18/2024 DOS: the patient was seen and examined on 03/18/2024 PCP: Kandis Stefano Iles, MD  Patient coming from: {Point_of_Origin:[address]}  Chief Complaint:  Chief Complaint  Patient presents with   Respiratory Distress   HPI: Joe Torres is a 78 y.o. male with medical history significant of ***  Review of Systems: {ROS_Text:26778} Past Medical History:  Diagnosis Date   Arthritis    Asthma    Bladder cancer (HCC)    BPH (benign prostatic hyperplasia)    Cancer (HCC)    Cataracts, bilateral    COPD, severe (HCC)    Diverticulitis of large intestine with perforation without bleeding    Dyspnea    GERD (gastroesophageal reflux disease)    Hyperlipidemia    Hypertension    Obstructive sleep apnea    C-pap with oxygen   Osteoarthritis    Panlobular emphysema (HCC)    Postoperative ileus (HCC)    Right inguinal hernia    Past Surgical History:  Procedure Laterality Date   APPENDECTOMY  02/12/2023   Procedure: APPENDECTOMY;  Surgeon: Rodolph Romano, MD;  Location: [facility];  Service: General;;   CATARACT EXTRACTION W/ INTRAOCULAR LENS IMPLANT Right 05/18/2013   CATARACT EXTRACTION W/ INTRAOCULAR LENS IMPLANT Left 07/06/2013   COLONOSCOPY     COLONOSCOPY WITH PROPOFOL  N/A 01/16/2021   Procedure: COLONOSCOPY WITH PROPOFOL ;  Surgeon: Therisa Bi, MD;  Location: [facility];  Service: Gastroenterology;  Laterality: N/A;  SPANISH INTERPRETER 9 AM ARRIVAL REQUEST   ILEOSTOMY N/A 11/03/2022   Procedure: ILEOSTOMY;  Surgeon: Rodolph Romano, MD;  Location: [facility];  Service: General;  Laterality: N/A;   ILEOSTOMY CLOSURE N/A 02/12/2023   Procedure: ILEOSTOMY TAKEDOWN;  Surgeon: Rodolph Romano, MD;  Location: [facility];  Service: General;  Laterality: N/A;   INGUINAL HERNIA REPAIR Right 01/08/2016   LAPAROTOMY N/A 10/20/2022   Procedure: EXPLORATORY LAPAROTOMY,  SIGMOID  COLECTOMY, LOOP ILEOSTOMY;  Surgeon: Rodolph Romano, MD;  Location: [facility];  Service: General;  Laterality: N/A;   LAPAROTOMY N/A 02/15/2023   Procedure: EXPLORATORY LAPAROTOMY;  Surgeon: Rodolph Romano, MD;  Location: [facility];  Service: General;  Laterality: N/A;   PORTA CATH INSERTION N/A 06/19/2020   Procedure: PORTA CATH INSERTION;  Surgeon: Marea Selinda RAMAN, MD;  Location: [facility];  Service: Cardiovascular;  Laterality: N/A;   PORTA CATH REMOVAL N/A 02/09/2024   Procedure: PORTA CATH REMOVAL;  Surgeon: Marea Selinda RAMAN, MD;  Location: [facility];  Service: Cardiovascular;  Laterality: N/A;   TRANSURETHRAL RESECTION OF BLADDER TUMOR WITH GYRUS (TURBT-GYRUS)  07/2020   TRANSURETHRAL RESECTION OF BLADDER TUMOR WITH MITOMYCIN -C N/A 05/22/2020   Procedure: TRANSURETHRAL RESECTION OF BLADDER TUMOR WITH Gemcitabine ;  Surgeon: Penne Knee, MD;  Location: [facility];  Service: Urology;  Laterality: N/A;   Social History:  reports that he quit smoking about 15 years ago. His smoking use included cigarettes. He started smoking about 55 years ago. He has a 60 pack-year smoking history. He has been exposed to tobacco smoke. He has never used smokeless tobacco. He reports that he does not currently use alcohol. He reports that he does not use drugs.  Allergies  Allergen Reactions   Shrimp [Shellfish Allergy] Anaphylaxis    Per MD comments critical   Dupilumab  Dermatitis    Family History  Problem Relation Age of Onset   Asthma Mother     Prior to Admission medications   Medication  Sig Start Date End Date Taking? Authorizing Provider  acetaminophen  (TYLENOL ) 500 MG tablet Take 500 mg by mouth every 8 (eight) hours as needed for moderate pain. Patient not taking: Reported on 02/09/2024    [provider]  albuterol  (PROVENTIL  HFA;VENTOLIN  HFA) 108 (90 Base) MCG/ACT inhaler Inhale 2 puffs into the lungs every 4 (four) hours as needed for wheezing or shortness  of breath.    [provider]  albuterol  (PROVENTIL ) (2.5 MG/3ML) 0.083% nebulizer solution Take 2.5 mg by nebulization every 4 (four) hours as needed for wheezing or shortness of breath.    [provider]  amLODipine  (NORVASC ) 10 MG tablet Take 10 mg by mouth at bedtime.    [provider]  arformoterol  (BROVANA ) 15 MCG/2ML NEBU Take 15 mcg by nebulization 2 (two) times daily.    [provider]  budesonide  (PULMICORT ) 0.5 MG/2ML nebulizer solution Take 0.5 mg by nebulization 2 (two) times daily.    [provider]  cetirizine (ZYRTEC) 10 MG tablet Take 10 mg by mouth at bedtime.    [provider]  Cholecalciferol (VITAMIN D-3) 25 MCG (1000 UT) CAPS Take 1 capsule by mouth at bedtime.    [provider]  diclofenac  Sodium (VOLTAREN ) 1 % GEL Apply 1 application topically 2 (two) times daily as needed (pain).    [provider]  famotidine  (PEPCID ) 40 MG tablet Take 40 mg by mouth at bedtime. 01/11/23   [provider]  fluticasone  (FLONASE ) 50 MCG/ACT nasal spray Place 1 spray into both nostrils daily.    [provider]  hydrochlorothiazide  (HYDRODIURIL ) 25 MG tablet Take 25 mg by mouth daily. 12/18/22   [provider]  KLOR-CON  M20 20 MEQ tablet TAKE 1 TABLET BY MOUTH TWICE A DAY FOR 14 DAYS, FOLLOWED BY 1 TABLET DAILY Patient not taking: Reported on 12/17/2023 07/18/23   Melanee Annah BROCKS, MD  lidocaine -prilocaine  (EMLA ) cream Apply to affected area once 04/29/22   Melanee Annah BROCKS, MD  losartan  (COZAAR ) 25 MG tablet Take 25 mg by mouth daily. 09/12/22   [provider]  metFORMIN (GLUCOPHAGE) 500 MG tablet Take 500 mg by mouth daily.    [provider]  montelukast  (SINGULAIR ) 10 MG tablet Take 10 mg by mouth at bedtime.    [provider]  OHTUVAYRE  3 MG/2.5ML SUSP  05/30/23   [provider]  OXYGEN Inhale 3 L into the lungs continuous.    [provider]   pantoprazole  (PROTONIX ) 40 MG tablet Take 40 mg by mouth daily.    [provider]  potassium chloride  SA (KLOR-CON  M) 20 MEQ tablet TAKE 1 TABLET BY MOUTH EVERY DAY FOR 7 DAYS 02/06/24   Melanee Annah BROCKS, MD  potassium chloride  SA (KLOR-CON  M20) 20 MEQ tablet Take 1 tablet (20 mEq total) by mouth 2 (two) times daily for 7 days. Patient not taking: Reported on 12/17/2023 08/14/23 11/26/23  Melanee Annah BROCKS, MD  predniSONE  (DELTASONE ) 10 MG tablet Take 1 tablet (10 mg total) by mouth daily. Take 20 mg daily x 3 days then resume 10 mg daily afterwards Patient taking differently: Take 10 mg by mouth daily. 11/07/22   Laurita Pillion, MD  simvastatin  (ZOCOR ) 20 MG tablet Take 20 mg by mouth daily at 6 PM. 07/27/10   [provider]  tamsulosin  (FLOMAX ) 0.4 MG CAPS capsule TAKE 1 CAPSULE BY MOUTH EVERY DAY 02/11/24   Stoioff, Glendia BROCKS, MD  tiotropium (SPIRIVA ) 18 MCG inhalation capsule Place 18 mcg  into inhaler and inhale daily.    [provider]  traMADol  (ULTRAM ) 50 MG tablet Take 50 mg by mouth 2 (two) times daily.    [provider]    Physical Exam: Vitals:   03/18/24 1813 03/18/24 1817 03/18/24 1823 03/18/24 1900  BP:    (!) 120/97  Pulse:  90  90  Resp:  (!) 27    Temp:   97.8 F (36.6 C)   SpO2:  98%  98%  Weight: 73.9 kg     Height: 5' 7 (1.702 m)      *** Data Reviewed: {Tip this will not be part of the note when signed- Document your independent interpretation of telemetry tracing, EKG, lab, Radiology test or any other diagnostic tests. Add any new diagnostic test ordered today. (Optional):26781} {Results:26384}  Assessment and Plan: No notes have been filed under this hospital service. Service: Hospitalist     Advance Care Planning:   Code Status: Prior ***  Consults: ***  Family Communication: ***  Severity of Illness: {Observation/Inpatient:21159}  Author: Posey Maier, DO 03/18/2024 7:52 PM  For on call review [email or web address].

## 2024-03-18 NOTE — ED Triage Notes (Signed)
 Pt to ED [facility] respitatory distress. Started feeling shob this afternoon. SpO2 98% with EMS, increased WOB, placed on CPAP with EMS, received 1 albuterol , duoneb, 125 solumedrol PTA. Bilateral 18g PTA.  Increased WOB noted on arrival, tripod positioning noted.  nitroglycerin paste placed on pt PTA.  Wears 3L [place] chronic.  nitroglycerin paste removed per Dr Levander

## 2024-03-18 NOTE — ED Notes (Signed)
Pt on bipap

## 2024-03-18 NOTE — Progress Notes (Signed)
 Pt transported to 242 on BIPAP without incident. Pt remains on Bipap and is tol well at this time. Report given to floor therapist.

## 2024-03-18 NOTE — H&P (Incomplete)
 History and Physical    PatientBETHA JONG Torres FMW:[id number] DOB: [birth year] DOA: 03/18/2024 DOS: the patient was seen and examined on 03/18/2024 PCP: Kandis Stefano Iles, MD  Patient coming from: Home  Chief Complaint:  Chief Complaint  Patient presents with  . Respiratory Distress   HPI: Joe Torres is a 78 y.o. male with medical history significant of COPD on supplemental oxygen via [place] at 3 LPM, hypertension, GERD, history of bladder cancer in remission who presents to the emergency department due to shortness of breath which started yesterday, this worsened in the afternoon today.  EMS was activated and on arrival of EMS team he was noted with increased work of breathing, he was tripoding and presented with bilateral wheezes, so he was placed on CPAP, breathing treatment and IV Solu-Medrol  25 mg x 1 was given. Patient was also noted to have elevated BP around 200, so, Nitropaste was applied over his chest.  Apparently, patient recently completed antibiotics a few weeks ago. Apparently, patient saw his pulmonologist on 01/28/2024 and he was prescribed with Bactrim  prednisone , nystatin due to concern for oral thrush.  ED course In the emergency department, BP was 120/97, respiratory rate 27/min, O2 sat was 98% on BiPAP at FiO2 of 40%, other vital signs are within normal range.  VBG done showed pH of 7.34, pCO2 72, pO2 34, bicarb 38.8.  Workup in the ED showed normal CBC except for WBC of 11.5 and hemoglobin of 12.9.  BMP was normal except for chloride of 97 and blood glucose of 163, troponin x 2 was flat at 5, BNP 27.1.  Influenza A, B, SARS-CoV-2, RSV was negative. Chest x-ray showed no acute intrathoracic process Breathing treatment with DuoNeb was provided.  TRH was asked to admit patient    Review of Systems: As mentioned in the history of present illness. All other systems reviewed and are negative. Past Medical History:  Diagnosis Date  . Arthritis   . Asthma   . Bladder cancer  (HCC)   . BPH (benign prostatic hyperplasia)   . Cancer (HCC)   . Cataracts, bilateral   . COPD, severe (HCC)   . Diverticulitis of large intestine with perforation without bleeding   . Dyspnea   . GERD (gastroesophageal reflux disease)   . Hyperlipidemia   . Hypertension   . Obstructive sleep apnea    C-pap with oxygen  . Osteoarthritis   . Panlobular emphysema (HCC)   . Postoperative ileus (HCC)   . Right inguinal hernia    Past Surgical History:  Procedure Laterality Date  . APPENDECTOMY  02/12/2023   Procedure: APPENDECTOMY;  Surgeon: Rodolph Romano, MD;  Location: [facility];  Service: General;;  . CATARACT EXTRACTION W/ INTRAOCULAR LENS IMPLANT Right 05/18/2013  . CATARACT EXTRACTION W/ INTRAOCULAR LENS IMPLANT Left 07/06/2013  . COLONOSCOPY    . COLONOSCOPY WITH PROPOFOL  N/A 01/16/2021   Procedure: COLONOSCOPY WITH PROPOFOL ;  Surgeon: Therisa Bi, MD;  Location: [facility]COPY;  Service: Gastroenterology;  Laterality: N/A;  SPANISH INTERPRETER 9 AM ARRIVAL REQUEST  . ILEOSTOMY N/A 11/03/2022   Procedure: ILEOSTOMY;  Surgeon: Rodolph Romano, MD;  Location: [facility];  Service: General;  Laterality: N/A;  . ILEOSTOMY CLOSURE N/A 02/12/2023   Procedure: ILEOSTOMY TAKEDOWN;  Surgeon: Rodolph Romano, MD;  Location: [facility];  Service: General;  Laterality: N/A;  . INGUINAL HERNIA REPAIR Right 01/08/2016  . LAPAROTOMY N/A 10/20/2022   Procedure: EXPLORATORY LAPAROTOMY,  SIGMOID COLECTOMY, LOOP ILEOSTOMY;  Surgeon: Rodolph Romano, MD;  Location:  [facility];  Service: General;  Laterality: N/A;  . LAPAROTOMY N/A 02/15/2023   Procedure: EXPLORATORY LAPAROTOMY;  Surgeon: Rodolph Romano, MD;  Location: [facility];  Service: General;  Laterality: N/A;  . PORTA CATH INSERTION N/A 06/19/2020   Procedure: PORTA CATH INSERTION;  Surgeon: Marea Selinda RAMAN, MD;  Location: [facility];  Service: Cardiovascular;  Laterality: N/A;  . PORTA CATH REMOVAL N/A  02/09/2024   Procedure: PORTA CATH REMOVAL;  Surgeon: Marea Selinda RAMAN, MD;  Location: [facility];  Service: Cardiovascular;  Laterality: N/A;  . TRANSURETHRAL RESECTION OF BLADDER TUMOR WITH GYRUS (TURBT-GYRUS)  07/2020  . TRANSURETHRAL RESECTION OF BLADDER TUMOR WITH MITOMYCIN -C N/A 05/22/2020   Procedure: TRANSURETHRAL RESECTION OF BLADDER TUMOR WITH Gemcitabine ;  Surgeon: Penne Knee, MD;  Location: [facility];  Service: Urology;  Laterality: N/A;   Social History:  reports that he quit smoking about 15 years ago. His smoking use included cigarettes. He started smoking about 55 years ago. He has a 60 pack-year smoking history. He has been exposed to tobacco smoke. He has never used smokeless tobacco. He reports that he does not currently use alcohol. He reports that he does not use drugs.  Allergies  Allergen Reactions  . Shrimp [Shellfish Allergy] Anaphylaxis    Per MD comments critical  . Dupilumab  Dermatitis    Family History  Problem Relation Age of Onset  . Asthma Mother     Prior to Admission medications   Medication Sig Start Date End Date Taking? Authorizing Provider  acetaminophen  (TYLENOL ) 500 MG tablet Take 500 mg by mouth every 8 (eight) hours as needed for moderate pain. Patient not taking: Reported on 02/09/2024    [provider]  albuterol  (PROVENTIL  HFA;VENTOLIN  HFA) 108 (90 Base) MCG/ACT inhaler Inhale 2 puffs into the lungs every 4 (four) hours as needed for wheezing or shortness of breath.    [provider]  albuterol  (PROVENTIL ) (2.5 MG/3ML) 0.083% nebulizer solution Take 2.5 mg by nebulization every 4 (four) hours as needed for wheezing or shortness of breath.    [provider]  amLODipine  (NORVASC ) 10 MG tablet Take 10 mg by mouth at bedtime.    [provider]  arformoterol  (BROVANA ) 15 MCG/2ML NEBU Take 15 mcg by nebulization 2 (two) times daily.    [provider]  budesonide  (PULMICORT ) 0.5 MG/2ML  nebulizer solution Take 0.5 mg by nebulization 2 (two) times daily.    [provider]  cetirizine (ZYRTEC) 10 MG tablet Take 10 mg by mouth at bedtime.    [provider]  Cholecalciferol (VITAMIN D-3) 25 MCG (1000 UT) CAPS Take 1 capsule by mouth at bedtime.    [provider]  diclofenac  Sodium (VOLTAREN ) 1 % GEL Apply 1 application topically 2 (two) times daily as needed (pain).    [provider]  famotidine  (PEPCID ) 40 MG tablet Take 40 mg by mouth at bedtime. 01/11/23   [provider]  fluticasone  (FLONASE ) 50 MCG/ACT nasal spray Place 1 spray into both nostrils daily.    [provider]  hydrochlorothiazide  (HYDRODIURIL ) 25 MG tablet Take 25 mg by mouth daily. 12/18/22   [provider]  KLOR-CON  M20 20 MEQ tablet TAKE 1 TABLET BY MOUTH TWICE A DAY FOR 14 DAYS, FOLLOWED BY 1 TABLET DAILY Patient not taking: Reported on 12/17/2023 07/18/23   Melanee Annah BROCKS, MD  lidocaine -prilocaine  (EMLA ) cream Apply to affected area once 04/29/22   Melanee Annah BROCKS, MD  losartan  (COZAAR ) 25  MG tablet Take 25 mg by mouth daily. 09/12/22   [provider]  metFORMIN (GLUCOPHAGE) 500 MG tablet Take 500 mg by mouth daily.    [provider]  montelukast  (SINGULAIR ) 10 MG tablet Take 10 mg by mouth at bedtime.    [provider]  OHTUVAYRE  3 MG/2.5ML SUSP  05/30/23   [provider]  OXYGEN Inhale 3 L into the lungs continuous.    [provider]  pantoprazole  (PROTONIX ) 40 MG tablet Take 40 mg by mouth daily.    [provider]  potassium chloride  SA (KLOR-CON  M) 20 MEQ tablet TAKE 1 TABLET BY MOUTH EVERY DAY FOR 7 DAYS 02/06/24   Melanee Annah BROCKS, MD  potassium chloride  SA (KLOR-CON  M20) 20 MEQ tablet Take 1 tablet (20 mEq total) by mouth 2 (two) times daily for 7 days. Patient not taking: Reported on 12/17/2023 08/14/23 11/26/23  Melanee Annah BROCKS, MD  predniSONE  (DELTASONE ) 10 MG tablet Take 1 tablet (10 mg total)  by mouth daily. Take 20 mg daily x 3 days then resume 10 mg daily afterwards Patient taking differently: Take 10 mg by mouth daily. 11/07/22   Laurita Pillion, MD  simvastatin  (ZOCOR ) 20 MG tablet Take 20 mg by mouth daily at 6 PM. 07/27/10   [provider]  tamsulosin  (FLOMAX ) 0.4 MG CAPS capsule TAKE 1 CAPSULE BY MOUTH EVERY DAY 02/11/24   Stoioff, Glendia BROCKS, MD  tiotropium (SPIRIVA ) 18 MCG inhalation capsule Place 18 mcg into inhaler and inhale daily.    [provider]  traMADol  (ULTRAM ) 50 MG tablet Take 50 mg by mouth 2 (two) times daily.    [provider]    Physical Exam: Vitals:   03/18/24 1813 03/18/24 1817 03/18/24 1823 03/18/24 1900  BP:    (!) 120/97  Pulse:  90  90  Resp:  (!) 27    Temp:   97.8 F (36.6 C)   SpO2:  98%  98%  Weight: 73.9 kg     Height: 5' 7 (1.702 m)      General: Elderly male. Awake and alert and oriented x3. Not in any acute distress.  HEENT: NCAT.  PERRLA. EOMI. Sclerae anicteric.  Moist mucosal membranes. Neck: Neck supple without lymphadenopathy. No carotid bruits. No masses palpated.  Cardiovascular: Regular rate with normal S1-S2 sounds. No murmurs, rubs or gallops auscultated. No JVD.  Respiratory: Coarse breath sounds with few expiratory wheezes on auscultation.  Patient on BiPAP Abdomen: Soft, nontender, nondistended. Active bowel sounds. No masses or hepatosplenomegaly  Skin: No rashes, lesions, or ulcerations.  Dry, warm to touch. Musculoskeletal:  2+ dorsalis pedis and radial pulses. Good ROM.  No contractures  Psychiatric: Intact judgment and insight.  Mood appropriate to current condition. Neurologic: No focal neurological deficits. Strength is 5/5 x 4.  CN II - XII grossly intact.  Data Reviewed: EKG personally reviewed shows sinus tachycardia at a rate of 104 bpm with QTc of 526 ms  Assessment and Plan: Acute exacerbation of COPD Acute on chronic respiratory failure with hypoxia and hypercapnia Continue duo  nebs, Mucinex, Solu-Medrol , azithromycin. Continue Protonix  to prevent steroid-induced ulcer Continue incentive spirometry and flutter valve Continue supplemental oxygen to maintain O2 sat > 92% with plan to wean patient off oxygen as tolerated  Prolonged QT interval QTc 526 ms Avoid QT prolonging drugs Magnesium  level will be checked Repeat EKG in the morning     Advance Care Planning:   Code Status: Prior ***  Consults: ***  Family Communication: ***  Severity of Illness: {Observation/Inpatient:21159}  Author: Posey Maier, DO 03/18/2024 7:52 PM  For on call review [email or web address].

## 2024-03-18 NOTE — ED Notes (Signed)
 Dr Levander notified of pCO2 72. Orders to be placed as needed.

## 2024-03-19 DIAGNOSIS — E119 Type 2 diabetes mellitus without complications: Secondary | ICD-10-CM | POA: Diagnosis present

## 2024-03-19 DIAGNOSIS — Z888 Allergy status to other drugs, medicaments and biological substances status: Secondary | ICD-10-CM | POA: Diagnosis not present

## 2024-03-19 DIAGNOSIS — Z7952 Long term (current) use of systemic steroids: Secondary | ICD-10-CM | POA: Diagnosis not present

## 2024-03-19 DIAGNOSIS — J9621 Acute and chronic respiratory failure with hypoxia: Secondary | ICD-10-CM | POA: Diagnosis present

## 2024-03-19 DIAGNOSIS — Z932 Ileostomy status: Secondary | ICD-10-CM | POA: Diagnosis not present

## 2024-03-19 DIAGNOSIS — J9622 Acute and chronic respiratory failure with hypercapnia: Secondary | ICD-10-CM | POA: Diagnosis present

## 2024-03-19 DIAGNOSIS — Z7951 Long term (current) use of inhaled steroids: Secondary | ICD-10-CM | POA: Diagnosis not present

## 2024-03-19 DIAGNOSIS — Z9981 Dependence on supplemental oxygen: Secondary | ICD-10-CM | POA: Diagnosis not present

## 2024-03-19 DIAGNOSIS — Z8551 Personal history of malignant neoplasm of bladder: Secondary | ICD-10-CM | POA: Diagnosis not present

## 2024-03-19 DIAGNOSIS — K219 Gastro-esophageal reflux disease without esophagitis: Secondary | ICD-10-CM | POA: Diagnosis present

## 2024-03-19 DIAGNOSIS — C678 Malignant neoplasm of overlapping sites of bladder: Secondary | ICD-10-CM | POA: Diagnosis present

## 2024-03-19 DIAGNOSIS — Z7984 Long term (current) use of oral hypoglycemic drugs: Secondary | ICD-10-CM | POA: Diagnosis not present

## 2024-03-19 DIAGNOSIS — Z87891 Personal history of nicotine dependence: Secondary | ICD-10-CM | POA: Diagnosis not present

## 2024-03-19 DIAGNOSIS — Z91013 Allergy to seafood: Secondary | ICD-10-CM | POA: Diagnosis not present

## 2024-03-19 DIAGNOSIS — J9601 Acute respiratory failure with hypoxia: Secondary | ICD-10-CM | POA: Diagnosis not present

## 2024-03-19 DIAGNOSIS — E782 Mixed hyperlipidemia: Secondary | ICD-10-CM | POA: Diagnosis present

## 2024-03-19 DIAGNOSIS — I10 Essential (primary) hypertension: Secondary | ICD-10-CM | POA: Diagnosis present

## 2024-03-19 DIAGNOSIS — Z79899 Other long term (current) drug therapy: Secondary | ICD-10-CM | POA: Diagnosis not present

## 2024-03-19 DIAGNOSIS — G8929 Other chronic pain: Secondary | ICD-10-CM | POA: Diagnosis present

## 2024-03-19 DIAGNOSIS — J441 Chronic obstructive pulmonary disease with (acute) exacerbation: Secondary | ICD-10-CM | POA: Diagnosis present

## 2024-03-19 DIAGNOSIS — J431 Panlobular emphysema: Secondary | ICD-10-CM | POA: Diagnosis present

## 2024-03-19 DIAGNOSIS — Z825 Family history of asthma and other chronic lower respiratory diseases: Secondary | ICD-10-CM | POA: Diagnosis not present

## 2024-03-19 DIAGNOSIS — G4733 Obstructive sleep apnea (adult) (pediatric): Secondary | ICD-10-CM | POA: Diagnosis present

## 2024-03-19 DIAGNOSIS — N400 Enlarged prostate without lower urinary tract symptoms: Secondary | ICD-10-CM | POA: Diagnosis present

## 2024-03-19 DIAGNOSIS — Z1152 Encounter for screening for COVID-19: Secondary | ICD-10-CM | POA: Diagnosis not present

## 2024-03-19 LAB — CBC
HCT: 39.1 % (ref 39.0–52.0)
Hemoglobin: 12.6 g/dL — ABNORMAL LOW (ref 13.0–17.0)
MCH: 29.4 pg (ref 26.0–34.0)
MCHC: 32.2 g/dL (ref 30.0–36.0)
MCV: 91.1 fL (ref 80.0–100.0)
Platelets: 224 K/uL (ref 150–400)
RBC: 4.29 MIL/uL (ref 4.22–5.81)
RDW: 13.9 % (ref 11.5–15.5)
WBC: 9.2 K/uL (ref 4.0–10.5)
nRBC: 0 % (ref 0.0–0.2)

## 2024-03-19 LAB — COMPREHENSIVE METABOLIC PANEL WITH GFR
ALT: 19 U/L (ref 0–44)
AST: 26 U/L (ref 15–41)
Albumin: 3.5 g/dL (ref 3.5–5.0)
Alkaline Phosphatase: 55 U/L (ref 38–126)
Anion gap: 11 (ref 5–15)
BUN: 18 mg/dL (ref 8–23)
CO2: 30 mmol/L (ref 22–32)
Calcium: 8.8 mg/dL — ABNORMAL LOW (ref 8.9–10.3)
Chloride: 100 mmol/L (ref 98–111)
Creatinine, Ser: 0.85 mg/dL (ref 0.61–1.24)
GFR, Estimated: >60 mL/min (ref >60)
Glucose, Bld: 154 mg/dL — ABNORMAL HIGH (ref 70–99)
Potassium: 3.7 mmol/L (ref 3.5–5.1)
Sodium: 141 mmol/L (ref 135–145)
Total Bilirubin: 0.9 mg/dL (ref 0.0–1.2)
Total Protein: 6.4 g/dL — ABNORMAL LOW (ref 6.5–8.1)

## 2024-03-19 LAB — PHOSPHORUS: Phosphorus: 4.6 mg/dL (ref 2.5–4.6)

## 2024-03-19 LAB — MAGNESIUM: Magnesium: 2.1 mg/dL (ref 1.7–2.4)

## 2024-03-19 MED ORDER — ALBUTEROL SULFATE (2.5 MG/3ML) 0.083% IN NEBU
2.5000 mg | INHALATION_SOLUTION | RESPIRATORY_TRACT | Status: DC | PRN
  Administered 2024-03-20 – 2024-03-21 (×3): 2.5 mg via RESPIRATORY_TRACT
  Filled 2024-03-19 (×3): qty 3

## 2024-03-19 MED ORDER — TRAMADOL HCL 50 MG PO TABS
50.0000 mg | ORAL_TABLET | Freq: Two times a day (BID) | ORAL | Status: DC
  Administered 2024-03-19 – 2024-03-21 (×5): 50 mg via ORAL
  Filled 2024-03-19 (×5): qty 1

## 2024-03-19 MED ORDER — IPRATROPIUM-ALBUTEROL 0.5-2.5 (3) MG/3ML IN SOLN
3.0000 mL | Freq: Two times a day (BID) | RESPIRATORY_TRACT | Status: DC
  Administered 2024-03-20 – 2024-03-21 (×3): 3 mL via RESPIRATORY_TRACT
  Filled 2024-03-19 (×3): qty 3

## 2024-03-19 MED ORDER — ACETAMINOPHEN 650 MG RE SUPP: 650.0000 mg | Freq: Four times a day (QID) | RECTAL | Status: DC | PRN

## 2024-03-19 MED ORDER — ENOXAPARIN SODIUM 40 MG/0.4ML IJ SOSY
40.0000 mg | PREFILLED_SYRINGE | INTRAMUSCULAR | Status: DC
  Administered 2024-03-19 – 2024-03-21 (×4): 40 mg via SUBCUTANEOUS
  Filled 2024-03-19 (×3): qty 0.4

## 2024-03-19 MED ORDER — TAMSULOSIN HCL 0.4 MG PO CAPS
0.4000 mg | ORAL_CAPSULE | Freq: Every day | ORAL | Status: DC
  Administered 2024-03-19 – 2024-03-21 (×3): 0.4 mg via ORAL
  Filled 2024-03-19 (×3): qty 1

## 2024-03-19 MED ORDER — SIMVASTATIN 20 MG PO TABS
20.0000 mg | ORAL_TABLET | Freq: Every day | ORAL | Status: DC
  Administered 2024-03-19 – 2024-03-20 (×2): 20 mg via ORAL
  Filled 2024-03-19 (×2): qty 1

## 2024-03-19 MED ORDER — ACETAMINOPHEN 325 MG PO TABS: 650.0000 mg | ORAL_TABLET | Freq: Four times a day (QID) | ORAL | Status: DC | PRN

## 2024-03-19 MED ORDER — IPRATROPIUM BROMIDE 0.06 % NA SOLN
2.0000 | Freq: Two times a day (BID) | NASAL | Status: DC | PRN
  Administered 2024-03-20: 2 via NASAL
  Filled 2024-03-19: qty 15

## 2024-03-19 MED ORDER — AMLODIPINE BESYLATE 10 MG PO TABS
10.0000 mg | ORAL_TABLET | Freq: Every day | ORAL | Status: DC
  Administered 2024-03-19 – 2024-03-20 (×2): 10 mg via ORAL
  Filled 2024-03-19 (×2): qty 1

## 2024-03-19 MED ORDER — METOPROLOL SUCCINATE ER 25 MG PO TB24
25.0000 mg | ORAL_TABLET | Freq: Every day | ORAL | Status: DC
  Administered 2024-03-19 – 2024-03-21 (×3): 25 mg via ORAL
  Filled 2024-03-19 (×3): qty 1

## 2024-03-19 MED ORDER — PROCHLORPERAZINE EDISYLATE 10 MG/2ML IJ SOLN: 10.0000 mg | Freq: Four times a day (QID) | INTRAMUSCULAR | Status: DC | PRN

## 2024-03-19 MED ORDER — HYDROCHLOROTHIAZIDE 25 MG PO TABS
25.0000 mg | ORAL_TABLET | Freq: Every day | ORAL | Status: DC
  Administered 2024-03-19 – 2024-03-21 (×3): 25 mg via ORAL
  Filled 2024-03-19 (×3): qty 1

## 2024-03-19 MED ORDER — DOXYCYCLINE HYCLATE 100 MG PO TABS
100.0000 mg | ORAL_TABLET | Freq: Two times a day (BID) | ORAL | Status: DC
  Administered 2024-03-20 – 2024-03-21 (×3): 100 mg via ORAL
  Filled 2024-03-19 (×3): qty 1

## 2024-03-19 MED ORDER — POLYVINYL ALCOHOL 1.4 % OP SOLN
1.0000 [drp] | OPHTHALMIC | Status: DC | PRN
  Filled 2024-03-19: qty 15

## 2024-03-19 MED ORDER — LOSARTAN POTASSIUM 25 MG PO TABS
25.0000 mg | ORAL_TABLET | Freq: Every day | ORAL | Status: DC
  Administered 2024-03-19 – 2024-03-21 (×3): 25 mg via ORAL
  Filled 2024-03-19 (×3): qty 1

## 2024-03-19 NOTE — Plan of Care (Signed)

## 2024-03-19 NOTE — Progress Notes (Signed)
 PROGRESS NOTE    KARDER Torres  FMW:[id number] DOB: 12/30/1945 DOA: 03/18/2024 PCP: Kandis Stefano Iles, MD  Outpatient Specialists: pulmonology, oncology    Brief Narrative:   From admission h and p  Joe Torres is a 78 y.o. male with medical history significant of COPD on supplemental oxygen via [place] at 3 LPM, hypertension, GERD, history of bladder cancer in remission who presents to the emergency department due to shortness of breath which started yesterday, this worsened in the afternoon today.  EMS was activated and on arrival of EMS team he was noted with increased work of breathing, he was tripoding and presented with bilateral wheezes, so he was placed on CPAP, breathing treatment and IV Solu-Medrol  25 mg x 1 was given. Patient was also noted to have elevated BP around 200, so, Nitropaste was applied over his chest.  Apparently, patient recently completed antibiotics a few weeks ago. Apparently, patient saw his pulmonologist on 01/28/2024 and he was prescribed with Bactrim  prednisone , nystatin due to concern for oral thrush.     Assessment & Plan:   Principal Problem:   Acute exacerbation of chronic obstructive pulmonary disease (COPD) (HCC) Active Problems:   Hypertension   OSA on CPAP   Malignant neoplasm of overlapping sites of bladder (HCC)   Ileostomy present (HCC)  # COPD with acute exacerbation Cxr clear, normal trops and bnp. Improving - continue steroids, breathing treatments, switch abx from azith to doxy given prolonged qtc - is on daily prednisone  10 at home - sputum for culture if able to produce (not able to currently) - f/u rvp  # Acute on chronic hypoxic hypercarbic respiratory failure Dyspneic on arrival with elevated co2, treated with bipap overnight, now much improved - continue [place] o2, is currently on home 3 liters - close f/u with pulm outpt, may need bipap at home  # OSA - home cpap at bedtime  # History bladder cancer Treated, now surveilled  #  HTN Mild bp elevation - home amlodipine , losartan , metop, hydrochlorothiazide   # T2DM Euglycemic -  monitor  # Chronic pain - home tramadol    # BPH - home flomax    DVT prophylaxis: lovenox  Code Status: full Family Communication: wife and daughter at bedside 11/7  Level of care: Progressive Status is: Observation    Consultants:  none  Procedures: none  Antimicrobials:  See above    Subjective:  Reports improvement in dyspnea and cough  Objective: Vitals:   03/19/24 0219 03/19/24 0603 03/19/24 0827 03/19/24 0933  BP:  124/79 117/75 126/78  Pulse:  80 81 92  Resp:  20 18   Temp:  97.6 F (36.4 C) (!) 97.4 F (36.3 C)   TempSrc:   Oral   SpO2: 99% 100% 100%   Weight:      Height:       No intake or output data in the 24 hours ending 03/19/24 1001 Filed Weights   03/18/24 1813 03/18/24 2140  Weight: 73.9 kg 72.8 kg    Examination:  General exam: Appears calm and comfortable  Respiratory system: exp wheeze, scattered rhonchi Cardiovascular system: S1 & S2 heard, RRR. No JVD, murmurs, rubs, gallops or clicks. No pedal edema. Gastrointestinal system: Abdomen is nondistended, soft and nontender.   Central nervous system: Alert and oriented. No focal neurological deficits. Extremities: Symmetric 5 x 5 power. Skin: No rashes, lesions or ulcers Psychiatry: Judgement and insight appear normal. Mood & affect appropriate.     Data Reviewed: I have personally reviewed  following labs and imaging studies  CBC: Recent Labs  Lab 03/18/24 1815 03/19/24 0409  WBC 11.5* 9.2  NEUTROABS 9.0*  --   HGB 12.9* 12.6*  HCT 40.3 39.1  MCV 92.0 91.1  PLT 237 224   Basic Metabolic Panel: Recent Labs  Lab 03/18/24 1815 03/19/24 0409  NA 141 141  K 3.8 3.7  CL 97* 100  CO2 31 30  GLUCOSE 163* 154*  BUN 15 18  CREATININE 0.85 0.85  CALCIUM 9.1 8.8*  MG  --  2.1  PHOS  --  4.6   GFR: Estimated Creatinine Clearance: 69.1 mL/min (by C-G formula based on  SCr of 0.85 mg/dL). Liver Function Tests: Recent Labs  Lab 03/18/24 1815 03/19/24 0409  AST 23 26  ALT 19 19  ALKPHOS 64 55  BILITOT 0.7 0.9  PROT 6.7 6.4*  ALBUMIN  3.9 3.5   No results for input(s): LIPASE, AMYLASE in the last 168 hours. No results for input(s): AMMONIA in the last 168 hours. Coagulation Profile: No results for input(s): INR, PROTIME in the last 168 hours. Cardiac Enzymes: No results for input(s): CKTOTAL, CKMB, CKMBINDEX, TROPONINI in the last 168 hours. BNP (last 3 results) No results for input(s): PROBNP in the last 8760 hours. HbA1C: No results for input(s): HGBA1C in the last 72 hours. CBG: No results for input(s): GLUCAP in the last 168 hours. Lipid Profile: No results for input(s): CHOL, HDL, LDLCALC, TRIG, CHOLHDL, LDLDIRECT in the last 72 hours. Thyroid Function Tests: No results for input(s): TSH, T4TOTAL, FREET4, T3FREE, THYROIDAB in the last 72 hours. Anemia Panel: No results for input(s): VITAMINB12, FOLATE, FERRITIN, TIBC, IRON, RETICCTPCT in the last 72 hours. Urine analysis:    Component Value Date/Time   COLORURINE YELLOW (A) 10/20/2022 0826   APPEARANCEUR Clear 11/26/2023 1106   LABSPEC 1.015 10/20/2022 0826   PHURINE 6.0 10/20/2022 0826   GLUCOSEU Negative 11/26/2023 1106   HGBUR NEGATIVE 10/20/2022 0826   BILIRUBINUR Negative 11/26/2023 1106   KETONESUR NEGATIVE 10/20/2022 0826   PROTEINUR Negative 11/26/2023 1106   PROTEINUR NEGATIVE 10/20/2022 0826   NITRITE Negative 11/26/2023 1106   NITRITE NEGATIVE 10/20/2022 0826   LEUKOCYTESUR Negative 11/26/2023 1106   LEUKOCYTESUR NEGATIVE 10/20/2022 0826   Sepsis Labs: @LABRCNTIP (procalcitonin:4,lacticidven:4)  ) Recent Results (from the past 240 hours)  Resp panel by RT-PCR (RSV, Flu A&B, Covid) Anterior Nasal Swab     Status: None   Collection Time: 03/18/24  6:19 PM   Specimen: Anterior Nasal Swab  Result Value Ref  Range Status   SARS Coronavirus 2 by RT PCR NEGATIVE NEGATIVE Final    Comment: (NOTE) SARS-CoV-2 target nucleic acids are NOT DETECTED.  The SARS-CoV-2 RNA is generally detectable in upper respiratory specimens during the acute phase of infection. The lowest concentration of SARS-CoV-2 viral copies this assay can detect is 138 copies/mL. A negative result does not preclude SARS-Cov-2 infection and should not be used as the sole basis for treatment or other patient management decisions. A negative result may occur with  improper specimen collection/handling, submission of specimen other than nasopharyngeal swab, presence of viral mutation(s) within the areas targeted by this assay, and inadequate number of viral copies(<138 copies/mL). A negative result must be combined with clinical observations, patient history, and epidemiological information. The expected result is Negative.  Fact Sheet for Patients:  bloggercourse.com  Fact Sheet for Healthcare Providers:  seriousbroker.it  This test is no t yet approved or cleared by the [place]  FDA and  has  been authorized for detection and/or diagnosis of SARS-CoV-2 by FDA under an Emergency Use Authorization (EUA). This EUA will remain  in effect (meaning this test can be used) for the duration of the COVID-19 declaration under Section 564(b)(1) of the Act, 21 U.S.C.section 360bbb-3(b)(1), unless the authorization is terminated  or revoked sooner.       Influenza A by PCR NEGATIVE NEGATIVE Final   Influenza B by PCR NEGATIVE NEGATIVE Final    Comment: (NOTE) The Xpert Xpress SARS-CoV-2/FLU/RSV plus assay is intended as an aid in the diagnosis of influenza from Nasopharyngeal swab specimens and should not be used as a sole basis for treatment. Nasal washings and aspirates are unacceptable for Xpert Xpress SARS-CoV-2/FLU/RSV testing.  Fact Sheet for  Patients: bloggercourse.com  Fact Sheet for Healthcare Providers: seriousbroker.it  This test is not yet approved or cleared by the [place]  FDA and has been authorized for detection and/or diagnosis of SARS-CoV-2 by FDA under an Emergency Use Authorization (EUA). This EUA will remain in effect (meaning this test can be used) for the duration of the COVID-19 declaration under Section 564(b)(1) of the Act, 21 U.S.C. section 360bbb-3(b)(1), unless the authorization is terminated or revoked.     Resp Syncytial Virus by PCR NEGATIVE NEGATIVE Final    Comment: (NOTE) Fact Sheet for Patients: bloggercourse.com  Fact Sheet for Healthcare Providers: seriousbroker.it  This test is not yet approved or cleared by the [place]  FDA and has been authorized for detection and/or diagnosis of SARS-CoV-2 by FDA under an Emergency Use Authorization (EUA). This EUA will remain in effect (meaning this test can be used) for the duration of the COVID-19 declaration under Section 564(b)(1) of the Act, 21 U.S.C. section 360bbb-3(b)(1), unless the authorization is terminated or revoked.  Performed at [facility], [address], [place], [place] [address]          Radiology Studies: DG Chest Portable 1 View Result Date: 03/18/2024 CLINICAL DATA:  Shortness of breath, history of bladder cancer EXAM: PORTABLE CHEST 1 VIEW COMPARISON:  02/12/2023 FINDINGS: Two frontal views of the chest demonstrate an unremarkable cardiac silhouette. No acute airspace disease, effusion, or pneumothorax. Stable emphysema. No acute bony abnormalities. IMPRESSION: 1. No acute intrathoracic process. Electronically Signed   By: Ozell Daring M.D.   On: 03/18/2024 18:48        Scheduled Meds:  amLODipine   10 mg Oral QHS   azithromycin  250 mg Oral Daily   dextromethorphan-guaiFENesin  1 tablet Oral  BID   enoxaparin  (LOVENOX ) injection  40 mg Subcutaneous Q24H   ipratropium-albuterol   3 mL Nebulization Q6H   losartan   25 mg Oral Daily   methylPREDNISolone  (SOLU-MEDROL ) injection  40 mg Intravenous Q12H   metoprolol succinate  25 mg Oral Daily   mouth rinse  15 mL Mouth Rinse 4 times per day   pantoprazole   40 mg Oral Daily   simvastatin   20 mg Oral q1800   tamsulosin   0.4 mg Oral Daily   Continuous Infusions:   LOS: 0 days     Devaughn KATHEE Ban, MD [facility]   If 7PM-7AM, please contact night-coverage [email or web address] Password [email or web address] 03/19/2024, 10:01 AM

## 2024-03-20 DIAGNOSIS — C678 Malignant neoplasm of overlapping sites of bladder: Secondary | ICD-10-CM | POA: Diagnosis not present

## 2024-03-20 DIAGNOSIS — J441 Chronic obstructive pulmonary disease with (acute) exacerbation: Secondary | ICD-10-CM | POA: Diagnosis not present

## 2024-03-20 DIAGNOSIS — Z932 Ileostomy status: Secondary | ICD-10-CM | POA: Diagnosis not present

## 2024-03-20 DIAGNOSIS — G4733 Obstructive sleep apnea (adult) (pediatric): Secondary | ICD-10-CM

## 2024-03-20 DIAGNOSIS — I10 Essential (primary) hypertension: Secondary | ICD-10-CM | POA: Diagnosis not present

## 2024-03-20 LAB — BASIC METABOLIC PANEL WITH GFR
Anion gap: 10 (ref 5–15)
BUN: 22 mg/dL (ref 8–23)
CO2: 32 mmol/L (ref 22–32)
Calcium: 8.8 mg/dL — ABNORMAL LOW (ref 8.9–10.3)
Chloride: 100 mmol/L (ref 98–111)
Creatinine, Ser: 0.81 mg/dL (ref 0.61–1.24)
GFR, Estimated: >60 mL/min (ref >60)
Glucose, Bld: 137 mg/dL — ABNORMAL HIGH (ref 70–99)
Potassium: 3.6 mmol/L (ref 3.5–5.1)
Sodium: 142 mmol/L (ref 135–145)

## 2024-03-20 LAB — RESPIRATORY PANEL BY PCR

## 2024-03-20 MED ORDER — GUAIFENESIN ER 600 MG PO TB12
1200.0000 mg | ORAL_TABLET | Freq: Two times a day (BID) | ORAL | Status: DC
  Administered 2024-03-20 – 2024-03-21 (×3): 1200 mg via ORAL
  Filled 2024-03-20 (×3): qty 2

## 2024-03-20 NOTE — Progress Notes (Signed)
 PROGRESS NOTE    Joe Torres  FMW:[id number] DOB: 1945-06-01 DOA: 03/18/2024 PCP: Kandis Stefano Iles, MD  Outpatient Specialists: pulmonology, oncology    Brief Narrative:   From admission h and p  Joe Torres is a 78 y.o. male with medical history significant of COPD on supplemental oxygen via [place] at 3 LPM, hypertension, GERD, history of bladder cancer in remission who presents to the emergency department due to shortness of breath which started yesterday, this worsened in the afternoon today.  EMS was activated and on arrival of EMS team he was noted with increased work of breathing, he was tripoding and presented with bilateral wheezes, so he was placed on CPAP, breathing treatment and IV Solu-Medrol  25 mg x 1 was given. Patient was also noted to have elevated BP around 200, so, Nitropaste was applied over his chest.  Apparently, patient recently completed antibiotics a few weeks ago. Apparently, patient saw his pulmonologist on 01/28/2024 and he was prescribed with Bactrim  prednisone , nystatin due to concern for oral thrush.   11/8: Still little short of breath, on baseline oxygen of 3 L.  Keeping for another day, added high-dose Mucinex.  Assessment & Plan:   Principal Problem:   Acute exacerbation of chronic obstructive pulmonary disease (COPD) (HCC) Active Problems:   Hypertension   OSA on CPAP   Malignant neoplasm of overlapping sites of bladder (HCC)   Ileostomy present (HCC)  # COPD with acute exacerbation Cxr clear, normal trops and bnp. Improving - continue steroids, breathing treatments, switch abx from azith to doxy given prolonged qtc - is on daily prednisone  10 at home, currently on Solu-Medrol   -Respiratory panel negative - Continue with supportive care  # Acute on chronic hypoxic hypercarbic respiratory failure Dyspneic on arrival with elevated co2, treated with bipap overnight, now much improved - continue [place] o2, is currently on home 3 liters - close f/u with  pulm outpt, may need bipap at home  # OSA - home cpap at bedtime  # History bladder cancer Treated, now surveilled  # HTN Mild bp elevation - home amlodipine , losartan , metop, hydrochlorothiazide   # T2DM Euglycemic -  monitor  # Chronic pain - home tramadol    # BPH - home flomax    DVT prophylaxis: lovenox  Code Status: full Family Communication: Discussed with wife at bedside  Level of care: Progressive Status is: Inpatient  Consultants:  none  Procedures: none  Antimicrobials:  See above    Subjective: Patient continued to feel little short of breath, stating he is getting closer to his baseline.  Still pursuing lips with mildly increased work of breathing.   Objective: Vitals:   03/20/24 0031 03/20/24 0459 03/20/24 0824 03/20/24 1246  BP: 135/78 127/80 136/79 (!) 147/82  Pulse: 78 78 95 94  Resp: 20 (!) 22    Temp: 98 F (36.7 C) 98.2 F (36.8 C) 97.6 F (36.4 C) 98 F (36.7 C)  TempSrc:      SpO2: 97% 99% 93% 92%  Weight:      Height:        Intake/Output Summary (Last 24 hours) at 03/20/2024 1354 Last data filed at 03/20/2024 1353 Gross per 24 hour  Intake 840 ml  Output 1100 ml  Net -260 ml   Filed Weights   03/18/24 1813 03/18/24 2140  Weight: 73.9 kg 72.8 kg    Examination:  General.  Frail elderly man, in no acute distress. Pulmonary.  Mildly decreased air entry, no active wheeze, mildly increased work  of breathing CV.  Regular rate and rhythm, no JVD, rub or murmur. Abdomen.  Soft, nontender, nondistended, BS positive. CNS.  Alert and oriented .  No focal neurologic deficit. Extremities.  No edema, no cyanosis, pulses intact and symmetrical. Psychiatry.  Judgment and insight appears normal.   Data Reviewed: I have personally reviewed following labs and imaging studies  CBC: Recent Labs  Lab 03/18/24 1815 03/19/24 0409  WBC 11.5* 9.2  NEUTROABS 9.0*  --   HGB 12.9* 12.6*  HCT 40.3 39.1  MCV 92.0 91.1  PLT 237 224    Basic Metabolic Panel: Recent Labs  Lab 03/18/24 1815 03/19/24 0409 03/20/24 0513  NA 141 141 142  K 3.8 3.7 3.6  CL 97* 100 100  CO2 31 30 32  GLUCOSE 163* 154* 137*  BUN 15 18 22   CREATININE 0.85 0.85 0.81  CALCIUM 9.1 8.8* 8.8*  MG  --  2.1  --   PHOS  --  4.6  --    GFR: Estimated Creatinine Clearance: 72.5 mL/min (by C-G formula based on SCr of 0.81 mg/dL). Liver Function Tests: Recent Labs  Lab 03/18/24 1815 03/19/24 0409  AST 23 26  ALT 19 19  ALKPHOS 64 55  BILITOT 0.7 0.9  PROT 6.7 6.4*  ALBUMIN  3.9 3.5   No results for input(s): LIPASE, AMYLASE in the last 168 hours. No results for input(s): AMMONIA in the last 168 hours. Coagulation Profile: No results for input(s): INR, PROTIME in the last 168 hours. Cardiac Enzymes: No results for input(s): CKTOTAL, CKMB, CKMBINDEX, TROPONINI in the last 168 hours. BNP (last 3 results) No results for input(s): PROBNP in the last 8760 hours. HbA1C: No results for input(s): HGBA1C in the last 72 hours. CBG: No results for input(s): GLUCAP in the last 168 hours. Lipid Profile: No results for input(s): CHOL, HDL, LDLCALC, TRIG, CHOLHDL, LDLDIRECT in the last 72 hours. Thyroid Function Tests: No results for input(s): TSH, T4TOTAL, FREET4, T3FREE, THYROIDAB in the last 72 hours. Anemia Panel: No results for input(s): VITAMINB12, FOLATE, FERRITIN, TIBC, IRON, RETICCTPCT in the last 72 hours. Urine analysis:    Component Value Date/Time   COLORURINE YELLOW (A) 10/20/2022 0826   APPEARANCEUR Clear 11/26/2023 1106   LABSPEC 1.015 10/20/2022 0826   PHURINE 6.0 10/20/2022 0826   GLUCOSEU Negative 11/26/2023 1106   HGBUR NEGATIVE 10/20/2022 0826   BILIRUBINUR Negative 11/26/2023 1106   KETONESUR NEGATIVE 10/20/2022 0826   PROTEINUR Negative 11/26/2023 1106   PROTEINUR NEGATIVE 10/20/2022 0826   NITRITE Negative 11/26/2023 1106   NITRITE NEGATIVE 10/20/2022  0826   LEUKOCYTESUR Negative 11/26/2023 1106   LEUKOCYTESUR NEGATIVE 10/20/2022 0826   Sepsis Labs: @LABRCNTIP (procalcitonin:4,lacticidven:4)  ) Recent Results (from the past 240 hours)  Resp panel by RT-PCR (RSV, Flu A&B, Covid) Anterior Nasal Swab     Status: None   Collection Time: 03/18/24  6:19 PM   Specimen: Anterior Nasal Swab  Result Value Ref Range Status   SARS Coronavirus 2 by RT PCR NEGATIVE NEGATIVE Final    Comment: (NOTE) SARS-CoV-2 target nucleic acids are NOT DETECTED.  The SARS-CoV-2 RNA is generally detectable in upper respiratory specimens during the acute phase of infection. The lowest concentration of SARS-CoV-2 viral copies this assay can detect is 138 copies/mL. A negative result does not preclude SARS-Cov-2 infection and should not be used as the sole basis for treatment or other patient management decisions. A negative result may occur with  improper specimen collection/handling, submission of specimen other than  nasopharyngeal swab, presence of viral mutation(s) within the areas targeted by this assay, and inadequate number of viral copies(<138 copies/mL). A negative result must be combined with clinical observations, patient history, and epidemiological information. The expected result is Negative.  Fact Sheet for Patients:  bloggercourse.com  Fact Sheet for Healthcare Providers:  seriousbroker.it  This test is no t yet approved or cleared by the [place]  FDA and  has been authorized for detection and/or diagnosis of SARS-CoV-2 by FDA under an Emergency Use Authorization (EUA). This EUA will remain  in effect (meaning this test can be used) for the duration of the COVID-19 declaration under Section 564(b)(1) of the Act, 21 U.S.C.section 360bbb-3(b)(1), unless the authorization is terminated  or revoked sooner.       Influenza A by PCR NEGATIVE NEGATIVE Final   Influenza B by PCR NEGATIVE  NEGATIVE Final    Comment: (NOTE) The Xpert Xpress SARS-CoV-2/FLU/RSV plus assay is intended as an aid in the diagnosis of influenza from Nasopharyngeal swab specimens and should not be used as a sole basis for treatment. Nasal washings and aspirates are unacceptable for Xpert Xpress SARS-CoV-2/FLU/RSV testing.  Fact Sheet for Patients: bloggercourse.com  Fact Sheet for Healthcare Providers: seriousbroker.it  This test is not yet approved or cleared by the [place]  FDA and has been authorized for detection and/or diagnosis of SARS-CoV-2 by FDA under an Emergency Use Authorization (EUA). This EUA will remain in effect (meaning this test can be used) for the duration of the COVID-19 declaration under Section 564(b)(1) of the Act, 21 U.S.C. section 360bbb-3(b)(1), unless the authorization is terminated or revoked.     Resp Syncytial Virus by PCR NEGATIVE NEGATIVE Final    Comment: (NOTE) Fact Sheet for Patients: bloggercourse.com  Fact Sheet for Healthcare Providers: seriousbroker.it  This test is not yet approved or cleared by the [place]  FDA and has been authorized for detection and/or diagnosis of SARS-CoV-2 by FDA under an Emergency Use Authorization (EUA). This EUA will remain in effect (meaning this test can be used) for the duration of the COVID-19 declaration under Section 564(b)(1) of the Act, 21 U.S.C. section 360bbb-3(b)(1), unless the authorization is terminated or revoked.  Performed at [facility], [address] [place], [place] [address]   Respiratory (~20 pathogens) panel by PCR     Status: None   Collection Time: 03/19/24 12:17 PM   Specimen: Nasopharyngeal Swab; Respiratory  Result Value Ref Range Status   Adenovirus NOT DETECTED NOT DETECTED Final   Coronavirus 229E NOT DETECTED NOT DETECTED Final    Comment: (NOTE) The Coronavirus  on the Respiratory Panel, DOES NOT test for the novel  Coronavirus (2019 nCoV)    Coronavirus HKU1 NOT DETECTED NOT DETECTED Final   Coronavirus NL63 NOT DETECTED NOT DETECTED Final   Coronavirus OC43 NOT DETECTED NOT DETECTED Final   Metapneumovirus NOT DETECTED NOT DETECTED Final   Rhinovirus / Enterovirus NOT DETECTED NOT DETECTED Final   Influenza A NOT DETECTED NOT DETECTED Final   Influenza B NOT DETECTED NOT DETECTED Final   Parainfluenza Virus 1 NOT DETECTED NOT DETECTED Final   Parainfluenza Virus 2 NOT DETECTED NOT DETECTED Final   Parainfluenza Virus 3 NOT DETECTED NOT DETECTED Final   Parainfluenza Virus 4 NOT DETECTED NOT DETECTED Final   Respiratory Syncytial Virus NOT DETECTED NOT DETECTED Final   Bordetella pertussis NOT DETECTED NOT DETECTED Final   Bordetella Parapertussis NOT DETECTED NOT DETECTED Final   Chlamydophila pneumoniae NOT DETECTED NOT DETECTED Final  Mycoplasma pneumoniae NOT DETECTED NOT DETECTED Final    Comment: Performed at [facility], [address] [place], [place] [address]     Radiology Studies: DG Chest Portable 1 View Result Date: 03/18/2024 CLINICAL DATA:  Shortness of breath, history of bladder cancer EXAM: PORTABLE CHEST 1 VIEW COMPARISON:  02/12/2023 FINDINGS: Two frontal views of the chest demonstrate an unremarkable cardiac silhouette. No acute airspace disease, effusion, or pneumothorax. Stable emphysema. No acute bony abnormalities. IMPRESSION: 1. No acute intrathoracic process. Electronically Signed   By: Ozell Daring M.D.   On: 03/18/2024 18:48    Scheduled Meds:  amLODipine   10 mg Oral QHS   doxycycline  100 mg Oral Q12H   enoxaparin  (LOVENOX ) injection  40 mg Subcutaneous Q24H   guaiFENesin  1,200 mg Oral BID   hydrochlorothiazide   25 mg Oral Daily   ipratropium-albuterol   3 mL Nebulization BID   losartan   25 mg Oral Daily   methylPREDNISolone  (SOLU-MEDROL ) injection  40 mg Intravenous Q12H   metoprolol succinate  25  mg Oral Daily   mouth rinse  15 mL Mouth Rinse 4 times per day   pantoprazole   40 mg Oral Daily   simvastatin   20 mg Oral q1800   tamsulosin   0.4 mg Oral Daily   traMADol   50 mg Oral BID   Continuous Infusions:   LOS: 1 day   This record has been created using Conservation officer, historic buildings. Errors have been sought and corrected,but may not always be located. Such creation errors do not reflect on the standard of care.   Amaryllis Dare, MD [facility]   If 7PM-7AM, please contact night-coverage [email or web address] Password [email or web address] 03/20/2024, 1:54 PM

## 2024-03-20 NOTE — Plan of Care (Signed)

## 2024-03-20 NOTE — Plan of Care (Signed)

## 2024-03-21 DIAGNOSIS — J9601 Acute respiratory failure with hypoxia: Secondary | ICD-10-CM

## 2024-03-21 DIAGNOSIS — J441 Chronic obstructive pulmonary disease with (acute) exacerbation: Secondary | ICD-10-CM | POA: Diagnosis not present

## 2024-03-21 DIAGNOSIS — I10 Essential (primary) hypertension: Secondary | ICD-10-CM | POA: Diagnosis not present

## 2024-03-21 DIAGNOSIS — Z932 Ileostomy status: Secondary | ICD-10-CM | POA: Diagnosis not present

## 2024-03-21 LAB — BASIC METABOLIC PANEL WITH GFR
Anion gap: 10 (ref 5–15)
BUN: 24 mg/dL — ABNORMAL HIGH (ref 8–23)
CO2: 33 mmol/L — ABNORMAL HIGH (ref 22–32)
Calcium: 8.7 mg/dL — ABNORMAL LOW (ref 8.9–10.3)
Chloride: 98 mmol/L (ref 98–111)
Creatinine, Ser: 0.69 mg/dL (ref 0.61–1.24)
GFR, Estimated: >60 mL/min (ref >60)
Glucose, Bld: 130 mg/dL — ABNORMAL HIGH (ref 70–99)
Potassium: 3.8 mmol/L (ref 3.5–5.1)
Sodium: 141 mmol/L (ref 135–145)

## 2024-03-21 MED ORDER — PREDNISONE 50 MG PO TABS: 50.0000 mg | ORAL_TABLET | Freq: Every day | ORAL | 0 refills | Status: AC

## 2024-03-21 MED ORDER — GUAIFENESIN ER 600 MG PO TB12: 1200.0000 mg | ORAL_TABLET | Freq: Two times a day (BID) | ORAL | 0 refills | Status: AC | PRN

## 2024-03-21 MED ORDER — DOXYCYCLINE HYCLATE 100 MG PO TABS: 100.0000 mg | ORAL_TABLET | Freq: Two times a day (BID) | ORAL | 0 refills | Status: AC

## 2024-03-21 NOTE — Progress Notes (Signed)
 Pt discharged to home. AVS discharge instructions provided in both English and Spanish to pt and family, at bedside. All questions and concerns answered at this time. Teach-back technique utilized. All PIVs removed, sites WDL.

## 2024-03-21 NOTE — Plan of Care (Signed)

## 2024-03-21 NOTE — Discharge Summary (Signed)
 Physician Discharge Summary   Patient: Joe Torres MRN: [id number] DOB: 1945/10/05  Admit date:     03/18/2024  Discharge date: 03/21/24  Discharge Physician: Amaryllis Dare   PCP: Kandis Stefano Iles, MD   Recommendations at discharge:  Please obtain CBC and BMP on follow-up Follow-up with pulmonology Follow-up with primary care provider  Discharge Diagnoses: Principal Problem:   Acute exacerbation of chronic obstructive pulmonary disease (COPD) (HCC) Active Problems:   Acute respiratory failure with hypoxia (HCC)   Hypertension   OSA on CPAP   Malignant neoplasm of overlapping sites of bladder (HCC)   Ileostomy present [facility])   Hospital Course: Joe Torres is a 78 y.o. male with medical history significant of COPD on supplemental oxygen via [place] at 3 LPM, hypertension, GERD, history of bladder cancer in remission who presents to the emergency department due to shortness of breath which started yesterday, this worsened in the afternoon today.  EMS was activated and on arrival of EMS team he was noted with increased work of breathing, he was tripoding and presented with bilateral wheezes, so he was placed on CPAP, breathing treatment and IV Solu-Medrol  25 mg x 1 was given. Patient was also noted to have elevated BP around 200, so, Nitropaste was applied over his chest.  Apparently, patient recently completed antibiotics a few weeks ago. Apparently, patient saw his pulmonologist on 01/28/2024 and he was prescribed with Bactrim  prednisone , nystatin due to concern for oral thrush.  Patient was admitted for concern of COPD exacerbation with acute on chronic hypoxic and hypercarbic respiratory failure.  Breathing status slowly improved.  Patient was getting BiPAP initially and then BiPAP at night.  He uses CPAP at home.  He was given high doses of steroid and is being discharged on 50 mg of prednisone  and doxycycline, after finishing 5 days of 50 mg of prednisone  he will resume his home dose of  prednisone  which is 10 mg daily.  Respiratory panel remain negative. Patient will get benefit from getting a BiPAP for night instead of CPAP.  He was advised to follow-up closely with his pulmonologist for further assistance.  Patient is currently on 3 L of oxygen which is his baseline.  Still having mild scattered wheeze which seems chronic.  Mild tachypnea but patient thinks that he is at baseline and wife confirmed it.  He wants to go home.  He will continue the rest of his home medications and follow-up with his providers for further management.  Consultants: None Procedures performed: None Disposition: Home Diet recommendation:  Discharge Diet Orders (From admission, onward)     Start     Ordered   03/21/24 0000  Diet - low sodium heart healthy        03/21/24 1057           Cardiac and Carb modified diet DISCHARGE MEDICATION: Allergies as of 03/21/2024       Reactions   Shrimp [shellfish Allergy] Anaphylaxis   Per MD comments critical   Dupilumab  Dermatitis        Medication List     PAUSE taking these medications    predniSONE  10 MG tablet Wait to take this until: March 27, 2024 Commonly known as: DELTASONE  Take 1 tablet (10 mg total) by mouth daily. Take 20 mg daily x 3 days then resume 10 mg daily afterwards You also have another medication with the same name that you may need to continue taking. What changed: additional instructions  STOP taking these medications    acetaminophen  500 MG tablet Commonly known as: TYLENOL    dexamethasone  2 MG tablet Commonly known as: DECADRON    Spiriva  HandiHaler 18 MCG Caps Generic drug: Tiotropium Bromide    tiotropium 18 MCG inhalation capsule Commonly known as: SPIRIVA        TAKE these medications    albuterol  108 (90 Base) MCG/ACT inhaler Commonly known as: VENTOLIN  HFA Inhale 2 puffs into the lungs every 4 (four) hours as needed for wheezing or shortness of breath.   albuterol  (2.5 MG/3ML)  0.083% nebulizer solution Commonly known as: PROVENTIL  Take 2.5 mg by nebulization every 4 (four) hours as needed for wheezing or shortness of breath.   amLODipine  10 MG tablet Commonly known as: NORVASC  Take 10 mg by mouth at bedtime.   arformoterol  15 MCG/2ML Nebu Commonly known as: BROVANA  Take 15 mcg by nebulization 2 (two) times daily.   budesonide  0.5 MG/2ML nebulizer solution Commonly known as: PULMICORT  Take 0.5 mg by nebulization 2 (two) times daily.   cetirizine 10 MG tablet Commonly known as: ZYRTEC Take 10 mg by mouth at bedtime.   Combivent Respimat 20-100 MCG/ACT Aers respimat Generic drug: Ipratropium-Albuterol  Inhale 2 puffs into the lungs 4 (four) times daily.   diclofenac  Sodium 1 % Gel Commonly known as: VOLTAREN  Apply 1 application topically 2 (two) times daily as needed (pain).   doxycycline 100 MG tablet Commonly known as: VIBRA-TABS Take 1 tablet (100 mg total) by mouth every 12 (twelve) hours for 4 days.   fluticasone  50 MCG/ACT nasal spray Commonly known as: FLONASE  Place 1 spray into both nostrils daily.   guaiFENesin 600 MG 12 hr tablet Commonly known as: MUCINEX Take 2 tablets (1,200 mg total) by mouth 2 (two) times daily as needed for to loosen phlegm.   hydrochlorothiazide  25 MG tablet Commonly known as: HYDRODIURIL  Take 25 mg by mouth daily.   ipratropium 0.06 % nasal spray Commonly known as: ATROVENT Place 2 sprays into the nose 2 (two) times daily as needed for rhinitis.   lidocaine -prilocaine  cream Commonly known as: EMLA  Apply to affected area once   losartan  25 MG tablet Commonly known as: COZAAR  Take 25 mg by mouth daily.   metFORMIN 500 MG tablet Commonly known as: GLUCOPHAGE Take 500 mg by mouth daily.   metoprolol succinate 25 MG 24 hr tablet Commonly known as: TOPROL-XL Take 25 mg by mouth daily.   montelukast  10 MG tablet Commonly known as: SINGULAIR  Take 10 mg by mouth at bedtime.   Ohtuvayre  3 MG/2.5ML  Susp Generic drug: Ensifentrine    OXYGEN Inhale 3 L into the lungs continuous.   pantoprazole  40 MG tablet Commonly known as: PROTONIX  Take 40 mg by mouth daily.   predniSONE  50 MG tablet Commonly known as: DELTASONE  Take 1 tablet (50 mg total) by mouth daily with breakfast for 5 days. What changed: Another medication with the same name was paused. Ask your nurse or doctor if you should take this medication.   simvastatin  20 MG tablet Commonly known as: ZOCOR  Take 20 mg by mouth daily at 6 PM.   sulfamethoxazole -trimethoprim 400-80 MG tablet Commonly known as: BACTRIM  Take 1 tablet by mouth 3 (three) times a week.   tamsulosin  0.4 MG Caps capsule Commonly known as: FLOMAX  TAKE 1 CAPSULE BY MOUTH EVERY DAY   traMADol  50 MG tablet Commonly known as: ULTRAM  Take 50 mg by mouth 2 (two) times daily.   Vitamin D-3 25 MCG (1000 UT) Caps Take 1 capsule by mouth  at bedtime.        Follow-up Information     Bender, Stefano Iles, MD. Schedule an appointment as soon as possible for a visit.   Specialty: Family Medicine Contact information: [address] [place] [address] [phone]         Parris Manna, MD. Schedule an appointment as soon as possible for a visit in 1 week(s).   Specialty: Pulmonary Disease Contact information: [address] [place] [address] [phone]                Discharge Exam: Fredricka Weights   03/18/24 1813 03/18/24 2140  Weight: 73.9 kg 72.8 kg   General.  Frail elderly man, in no acute distress. Pulmonary.  Some scattered wheeze bilaterally, normal respiratory effort. CV.  Regular rate and rhythm, no JVD, rub or murmur. Abdomen.  Soft, nontender, nondistended, BS positive. CNS.  Alert and oriented .  No focal neurologic deficit. Extremities.  No edema, pulses intact and symmetrical. Psychiatry.  Judgment and insight appears normal.   Condition at discharge: stable  The results of  significant diagnostics from this hospitalization (including imaging, microbiology, ancillary and laboratory) are listed below for reference.   Imaging Studies: DG Chest Portable 1 View Result Date: 03/18/2024 CLINICAL DATA:  Shortness of breath, history of bladder cancer EXAM: PORTABLE CHEST 1 VIEW COMPARISON:  02/12/2023 FINDINGS: Two frontal views of the chest demonstrate an unremarkable cardiac silhouette. No acute airspace disease, effusion, or pneumothorax. Stable emphysema. No acute bony abnormalities. IMPRESSION: 1. No acute intrathoracic process. Electronically Signed   By: Ozell Daring M.D.   On: 03/18/2024 18:48    Microbiology: Results for orders placed or performed during the hospital encounter of 03/18/24  Resp panel by RT-PCR (RSV, Flu A&B, Covid) Anterior Nasal Swab     Status: None   Collection Time: 03/18/24  6:19 PM   Specimen: Anterior Nasal Swab  Result Value Ref Range Status   SARS Coronavirus 2 by RT PCR NEGATIVE NEGATIVE Final    Comment: (NOTE) SARS-CoV-2 target nucleic acids are NOT DETECTED.  The SARS-CoV-2 RNA is generally detectable in upper respiratory specimens during the acute phase of infection. The lowest concentration of SARS-CoV-2 viral copies this assay can detect is 138 copies/mL. A negative result does not preclude SARS-Cov-2 infection and should not be used as the sole basis for treatment or other patient management decisions. A negative result may occur with  improper specimen collection/handling, submission of specimen other than nasopharyngeal swab, presence of viral mutation(s) within the areas targeted by this assay, and inadequate number of viral copies(<138 copies/mL). A negative result must be combined with clinical observations, patient history, and epidemiological information. The expected result is Negative.  Fact Sheet for Patients:  bloggercourse.com  Fact Sheet for Healthcare Providers:   seriousbroker.it  This test is no t yet approved or cleared by the [place]  FDA and  has been authorized for detection and/or diagnosis of SARS-CoV-2 by FDA under an Emergency Use Authorization (EUA). This EUA will remain  in effect (meaning this test can be used) for the duration of the COVID-19 declaration under Section 564(b)(1) of the Act, 21 U.S.C.section 360bbb-3(b)(1), unless the authorization is terminated  or revoked sooner.       Influenza A by PCR NEGATIVE NEGATIVE Final   Influenza B by PCR NEGATIVE NEGATIVE Final    Comment: (NOTE) The Xpert Xpress SARS-CoV-2/FLU/RSV plus assay is intended as an aid in the diagnosis of influenza from Nasopharyngeal  swab specimens and should not be used as a sole basis for treatment. Nasal washings and aspirates are unacceptable for Xpert Xpress SARS-CoV-2/FLU/RSV testing.  Fact Sheet for Patients: bloggercourse.com  Fact Sheet for Healthcare Providers: seriousbroker.it  This test is not yet approved or cleared by the [place]  FDA and has been authorized for detection and/or diagnosis of SARS-CoV-2 by FDA under an Emergency Use Authorization (EUA). This EUA will remain in effect (meaning this test can be used) for the duration of the COVID-19 declaration under Section 564(b)(1) of the Act, 21 U.S.C. section 360bbb-3(b)(1), unless the authorization is terminated or revoked.     Resp Syncytial Virus by PCR NEGATIVE NEGATIVE Final    Comment: (NOTE) Fact Sheet for Patients: bloggercourse.com  Fact Sheet for Healthcare Providers: seriousbroker.it  This test is not yet approved or cleared by the [place]  FDA and has been authorized for detection and/or diagnosis of SARS-CoV-2 by FDA under an Emergency Use Authorization (EUA). This EUA will remain in effect (meaning this test can be used) for  the duration of the COVID-19 declaration under Section 564(b)(1) of the Act, 21 U.S.C. section 360bbb-3(b)(1), unless the authorization is terminated or revoked.  Performed at [facility], [address] [place], [place] [address]   Respiratory (~20 pathogens) panel by PCR     Status: None   Collection Time: 03/19/24 12:17 PM   Specimen: Nasopharyngeal Swab; Respiratory  Result Value Ref Range Status   Adenovirus NOT DETECTED NOT DETECTED Final   Coronavirus 229E NOT DETECTED NOT DETECTED Final    Comment: (NOTE) The Coronavirus on the Respiratory Panel, DOES NOT test for the novel  Coronavirus (2019 nCoV)    Coronavirus HKU1 NOT DETECTED NOT DETECTED Final   Coronavirus NL63 NOT DETECTED NOT DETECTED Final   Coronavirus OC43 NOT DETECTED NOT DETECTED Final   Metapneumovirus NOT DETECTED NOT DETECTED Final   Rhinovirus / Enterovirus NOT DETECTED NOT DETECTED Final   Influenza A NOT DETECTED NOT DETECTED Final   Influenza B NOT DETECTED NOT DETECTED Final   Parainfluenza Virus 1 NOT DETECTED NOT DETECTED Final   Parainfluenza Virus 2 NOT DETECTED NOT DETECTED Final   Parainfluenza Virus 3 NOT DETECTED NOT DETECTED Final   Parainfluenza Virus 4 NOT DETECTED NOT DETECTED Final   Respiratory Syncytial Virus NOT DETECTED NOT DETECTED Final   Bordetella pertussis NOT DETECTED NOT DETECTED Final   Bordetella Parapertussis NOT DETECTED NOT DETECTED Final   Chlamydophila pneumoniae NOT DETECTED NOT DETECTED Final   Mycoplasma pneumoniae NOT DETECTED NOT DETECTED Final    Comment: Performed at [facility], [address] [place], [place] [address]    Labs: CBC: Recent Labs  Lab 03/18/24 1815 03/19/24 0409  WBC 11.5* 9.2  NEUTROABS 9.0*  --   HGB 12.9* 12.6*  HCT 40.3 39.1  MCV 92.0 91.1  PLT 237 224   Basic Metabolic Panel: Recent Labs  Lab 03/18/24 1815 03/19/24 0409 03/20/24 0513 03/21/24 0501  NA 141 141 142 141  K 3.8 3.7 3.6 3.8  CL 97* 100  100 98  CO2 31 30 32 33*  GLUCOSE 163* 154* 137* 130*  BUN 15 18 22  24*  CREATININE 0.85 0.85 0.81 0.69  CALCIUM 9.1 8.8* 8.8* 8.7*  MG  --  2.1  --   --   PHOS  --  4.6  --   --    Liver Function Tests: Recent Labs  Lab 03/18/24 1815 03/19/24 0409  AST 23 26  ALT 19 19  ALKPHOS 64 55  BILITOT 0.7 0.9  PROT 6.7 6.4*  ALBUMIN  3.9 3.5   CBG: No results for input(s): GLUCAP in the last 168 hours.  Discharge time spent: greater than 30 minutes.  This record has been created using Conservation officer, historic buildings. Errors have been sought and corrected,but may not always be located. Such creation errors do not reflect on the standard of care.   Signed: Amaryllis Dare, MD [facility] 03/21/2024

## 2024-03-23 ENCOUNTER — Other Ambulatory Visit: Payer: Self-pay | Admitting: Pulmonary Disease

## 2024-03-23 DIAGNOSIS — J9611 Chronic respiratory failure with hypoxia: Secondary | ICD-10-CM

## 2024-03-25 ENCOUNTER — Ambulatory Visit: Admit: 2024-03-25 | Admitting: Gastroenterology

## 2024-03-25 SURGERY — COLONOSCOPY
Anesthesia: General

## 2024-03-26 ENCOUNTER — Ambulatory Visit: Attending: Pulmonary Disease

## 2024-03-26 DIAGNOSIS — J4489 Other specified chronic obstructive pulmonary disease: Secondary | ICD-10-CM | POA: Insufficient documentation

## 2024-03-26 DIAGNOSIS — J9611 Chronic respiratory failure with hypoxia: Secondary | ICD-10-CM | POA: Diagnosis present

## 2024-03-26 LAB — BLOOD GAS, ARTERIAL
Acid-Base Excess: 13.6 mmol/L — ABNORMAL HIGH (ref 0.0–2.0)
Bicarbonate: 39 mmol/L — ABNORMAL HIGH (ref 20.0–28.0)
O2 Content: 3 L/min
O2 Saturation: 90.4 %
Patient temperature: 37
pCO2 arterial: 50 mmHg — ABNORMAL HIGH (ref 32–48)
pH, Arterial: 7.5 — ABNORMAL HIGH (ref 7.35–7.45)
pO2, Arterial: 64 mmHg — ABNORMAL LOW (ref 83–108)

## 2024-04-05 ENCOUNTER — Other Ambulatory Visit: Payer: Self-pay | Admitting: Pulmonary Disease

## 2024-04-05 DIAGNOSIS — J449 Chronic obstructive pulmonary disease, unspecified: Secondary | ICD-10-CM

## 2024-04-06 ENCOUNTER — Ambulatory Visit: Attending: Pulmonary Disease

## 2024-04-06 DIAGNOSIS — J449 Chronic obstructive pulmonary disease, unspecified: Secondary | ICD-10-CM | POA: Diagnosis present

## 2024-04-06 LAB — BLOOD GAS, ARTERIAL
Acid-Base Excess: 8.3 mmol/L — ABNORMAL HIGH (ref 0.0–2.0)
Bicarbonate: 33.4 mmol/L — ABNORMAL HIGH (ref 20.0–28.0)
FIO2: 32 %
O2 Saturation: 89 %
Patient temperature: 37
pCO2 arterial: 47 mmHg (ref 32–48)
pH, Arterial: 7.46 — ABNORMAL HIGH (ref 7.35–7.45)
pO2, Arterial: 62 mmHg — ABNORMAL LOW (ref 83–108)

## 2024-04-07 ENCOUNTER — Encounter

## 2024-05-19 ENCOUNTER — Encounter

## 2024-05-24 ENCOUNTER — Ambulatory Visit: Attending: Internal Medicine

## 2024-05-24 DIAGNOSIS — G4736 Sleep related hypoventilation in conditions classified elsewhere: Secondary | ICD-10-CM | POA: Diagnosis not present

## 2024-05-24 DIAGNOSIS — R400 Somnolence: Secondary | ICD-10-CM | POA: Insufficient documentation

## 2024-05-24 DIAGNOSIS — R0683 Snoring: Secondary | ICD-10-CM | POA: Insufficient documentation

## 2024-05-24 DIAGNOSIS — G4733 Obstructive sleep apnea (adult) (pediatric): Secondary | ICD-10-CM | POA: Insufficient documentation

## 2024-05-24 DIAGNOSIS — G4761 Periodic limb movement disorder: Secondary | ICD-10-CM | POA: Insufficient documentation

## 2024-05-31 ENCOUNTER — Ambulatory Visit (INDEPENDENT_AMBULATORY_CARE_PROVIDER_SITE_OTHER): Admitting: Urology

## 2024-05-31 ENCOUNTER — Encounter: Payer: Self-pay | Admitting: Urology

## 2024-05-31 VITALS — BP 114/70 | HR 84 | Ht 68.0 in | Wt 160.0 lb

## 2024-05-31 DIAGNOSIS — R3129 Other microscopic hematuria: Secondary | ICD-10-CM

## 2024-05-31 LAB — URINALYSIS, COMPLETE
Bilirubin, UA: NEGATIVE
Glucose, UA: NEGATIVE
Ketones, UA: NEGATIVE
Leukocytes,UA: NEGATIVE
Nitrite, UA: NEGATIVE
Protein,UA: NEGATIVE
RBC, UA: NEGATIVE
Specific Gravity, UA: 1.02 (ref 1.005–1.030)
Urobilinogen, Ur: 0.2 mg/dL (ref 0.2–1.0)
pH, UA: 5.5 (ref 5.0–7.5)

## 2024-05-31 LAB — MICROSCOPIC EXAMINATION
Bacteria, UA: NONE SEEN
Epithelial Cells (non renal): NONE SEEN /HPF (ref 0–10)

## 2024-05-31 NOTE — Progress Notes (Signed)
1/14*

## 2024-05-31 NOTE — Progress Notes (Signed)
" ° °  05/31/24  CC:  Chief Complaint  Patient presents with   Cysto   Urologic history: 1.  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  Denies gross hematuria.  Oncology follow-up August 2025 with negative CT chest/abdomen/pelvis.  Scheduled for CT chest/abdomen/pelvis February 2026-UA today negative  See rooming tab for vitals Cystoscopy Procedure Note  Patient identification was confirmed, informed consent was obtained, and patient was prepped using Betadine solution.  Lidocaine  jelly was administered per urethral meatus.     Pre-Procedure: - Inspection reveals a normal caliber urethral meatus.  Procedure: The flexible cystoscope was introduced without difficulty - No urethral strictures/lesions are present. -Moderate lateral lobe enlargement prostate  - Normal bladder neck - Bilateral ureteral orifices identified - Bladder mucosa  reveals no ulcers, tumors, or lesions - No bladder stones -Moderate trabeculation  Retroflexion shows no tumor/abnormalities   Post-Procedure: - Patient tolerated the procedure well  Assessment/ Plan: No evidence recurrent bladder tumor Urine cytology sent Follow-up surveillance cystoscopy 6 months    Glendia JAYSON Barba, MD "

## 2024-06-04 LAB — "CYTOLOGY PLUS MONITORING PROFILE: PAP & FEULGEN "

## 2024-06-06 ENCOUNTER — Ambulatory Visit: Payer: Self-pay | Admitting: Urology

## 2024-06-16 ENCOUNTER — Ambulatory Visit
Admission: RE | Admit: 2024-06-16 | Discharge: 2024-06-16 | Disposition: A | Source: Ambulatory Visit | Attending: Oncology

## 2024-06-16 DIAGNOSIS — Z08 Encounter for follow-up examination after completed treatment for malignant neoplasm: Secondary | ICD-10-CM

## 2024-06-16 MED ORDER — IOHEXOL 300 MG/ML  SOLN
100.0000 mL | Freq: Once | INTRAMUSCULAR | Status: AC | PRN
  Administered 2024-06-16: 100 mL via INTRAVENOUS

## 2024-06-30 ENCOUNTER — Ambulatory Visit: Admitting: Oncology

## 2024-06-30 ENCOUNTER — Other Ambulatory Visit
